# Patient Record
Sex: Male | Born: 1945 | Race: White | Hispanic: No | Marital: Single | Smoking: Former smoker
Health system: Southern US, Community
[De-identification: ages and names within clinical notes are randomized; demographics above are authoritative.]

## PROBLEM LIST (undated history)

## (undated) DIAGNOSIS — F32A Depression, unspecified: Secondary | ICD-10-CM

## (undated) DIAGNOSIS — I480 Paroxysmal atrial fibrillation: Secondary | ICD-10-CM

## (undated) DIAGNOSIS — T4145XA Adverse effect of unspecified anesthetic, initial encounter: Secondary | ICD-10-CM

## (undated) DIAGNOSIS — Z9889 Other specified postprocedural states: Secondary | ICD-10-CM

## (undated) DIAGNOSIS — I712 Thoracic aortic aneurysm, without rupture, unspecified: Secondary | ICD-10-CM

## (undated) DIAGNOSIS — T8859XA Other complications of anesthesia, initial encounter: Secondary | ICD-10-CM

## (undated) DIAGNOSIS — I10 Essential (primary) hypertension: Secondary | ICD-10-CM

## (undated) DIAGNOSIS — I313 Pericardial effusion (noninflammatory): Secondary | ICD-10-CM

## (undated) DIAGNOSIS — F329 Major depressive disorder, single episode, unspecified: Secondary | ICD-10-CM

## (undated) DIAGNOSIS — E785 Hyperlipidemia, unspecified: Secondary | ICD-10-CM

## (undated) DIAGNOSIS — G4733 Obstructive sleep apnea (adult) (pediatric): Secondary | ICD-10-CM

## (undated) DIAGNOSIS — I429 Cardiomyopathy, unspecified: Secondary | ICD-10-CM

## (undated) DIAGNOSIS — R160 Hepatomegaly, not elsewhere classified: Secondary | ICD-10-CM

## (undated) DIAGNOSIS — R931 Abnormal findings on diagnostic imaging of heart and coronary circulation: Secondary | ICD-10-CM

## (undated) DIAGNOSIS — M79609 Pain in unspecified limb: Secondary | ICD-10-CM

## (undated) DIAGNOSIS — I3139 Other pericardial effusion (noninflammatory): Secondary | ICD-10-CM

## (undated) DIAGNOSIS — I493 Ventricular premature depolarization: Secondary | ICD-10-CM

## (undated) HISTORY — DX: Pericardial effusion (noninflammatory): I31.3

## (undated) HISTORY — DX: Essential (primary) hypertension: I10

## (undated) HISTORY — DX: Cardiomyopathy, unspecified: I42.9

## (undated) HISTORY — DX: Other pericardial effusion (noninflammatory): I31.39

## (undated) HISTORY — PX: FRACTURE SURGERY: SHX138

## (undated) HISTORY — DX: Depression, unspecified: F32.A

## (undated) HISTORY — DX: Obstructive sleep apnea (adult) (pediatric): G47.33

## (undated) HISTORY — DX: Ventricular premature depolarization: I49.3

## (undated) HISTORY — DX: Abnormal findings on diagnostic imaging of heart and coronary circulation: R93.1

## (undated) HISTORY — DX: Major depressive disorder, single episode, unspecified: F32.9

## (undated) HISTORY — DX: Thoracic aortic aneurysm, without rupture, unspecified: I71.20

## (undated) HISTORY — DX: Other specified postprocedural states: Z98.890

## (undated) HISTORY — PX: ELBOW / UPPER ARM FOREIGN BODY REMOVAL: SUR1115

## (undated) HISTORY — DX: Paroxysmal atrial fibrillation: I48.0

## (undated) HISTORY — DX: Thoracic aortic aneurysm, without rupture: I71.2

## (undated) HISTORY — DX: Hyperlipidemia, unspecified: E78.5

## (undated) HISTORY — PX: CARDIAC ELECTROPHYSIOLOGY MAPPING AND ABLATION: SHX1292

## (undated) HISTORY — DX: Pain in unspecified limb: M79.609

## (undated) HISTORY — DX: Hepatomegaly, not elsewhere classified: R16.0

---

## 2005-07-20 ENCOUNTER — Emergency Department (HOSPITAL_COMMUNITY): Admission: EM | Admit: 2005-07-20 | Discharge: 2005-07-21 | Payer: Self-pay | Admitting: Emergency Medicine

## 2005-09-08 HISTORY — PX: CARDIOVASCULAR STRESS TEST: SHX262

## 2005-09-17 ENCOUNTER — Ambulatory Visit (HOSPITAL_COMMUNITY): Admission: RE | Admit: 2005-09-17 | Discharge: 2005-09-17 | Payer: Self-pay | Admitting: Cardiology

## 2007-01-29 ENCOUNTER — Inpatient Hospital Stay (HOSPITAL_COMMUNITY): Admission: EM | Admit: 2007-01-29 | Discharge: 2007-01-30 | Payer: Self-pay | Admitting: Emergency Medicine

## 2007-06-20 DIAGNOSIS — M79609 Pain in unspecified limb: Secondary | ICD-10-CM

## 2007-06-20 HISTORY — DX: Pain in unspecified limb: M79.609

## 2007-08-23 ENCOUNTER — Inpatient Hospital Stay (HOSPITAL_COMMUNITY): Admission: EM | Admit: 2007-08-23 | Discharge: 2007-08-23 | Payer: Self-pay | Admitting: Emergency Medicine

## 2007-08-23 ENCOUNTER — Encounter (INDEPENDENT_AMBULATORY_CARE_PROVIDER_SITE_OTHER): Payer: Self-pay | Admitting: Cardiology

## 2007-09-20 ENCOUNTER — Encounter: Admission: RE | Admit: 2007-09-20 | Discharge: 2007-09-20 | Payer: Self-pay | Admitting: Cardiology

## 2007-09-21 ENCOUNTER — Ambulatory Visit (HOSPITAL_COMMUNITY): Admission: RE | Admit: 2007-09-21 | Discharge: 2007-09-21 | Payer: Self-pay | Admitting: Cardiology

## 2007-10-04 ENCOUNTER — Ambulatory Visit (HOSPITAL_COMMUNITY): Admission: RE | Admit: 2007-10-04 | Discharge: 2007-10-04 | Payer: Self-pay | Admitting: Cardiology

## 2007-10-18 ENCOUNTER — Inpatient Hospital Stay (HOSPITAL_COMMUNITY): Admission: EM | Admit: 2007-10-18 | Discharge: 2007-10-19 | Payer: Self-pay | Admitting: Emergency Medicine

## 2007-10-18 HISTORY — PX: CARDIOVERSION: SHX1299

## 2007-10-19 ENCOUNTER — Ambulatory Visit: Admission: AD | Admit: 2007-10-19 | Discharge: 2007-10-19 | Payer: Self-pay | Admitting: Cardiology

## 2007-10-22 ENCOUNTER — Emergency Department (HOSPITAL_COMMUNITY): Admission: EM | Admit: 2007-10-22 | Discharge: 2007-10-22 | Payer: Self-pay | Admitting: Cardiology

## 2009-12-18 DIAGNOSIS — I493 Ventricular premature depolarization: Secondary | ICD-10-CM

## 2009-12-18 HISTORY — DX: Ventricular premature depolarization: I49.3

## 2010-07-22 ENCOUNTER — Other Ambulatory Visit: Payer: Self-pay | Admitting: Family Medicine

## 2010-07-22 DIAGNOSIS — M7989 Other specified soft tissue disorders: Secondary | ICD-10-CM

## 2010-07-22 DIAGNOSIS — R2230 Localized swelling, mass and lump, unspecified upper limb: Secondary | ICD-10-CM

## 2010-07-23 ENCOUNTER — Ambulatory Visit
Admission: RE | Admit: 2010-07-23 | Discharge: 2010-07-23 | Disposition: A | Discharge status: Home / Self Care | Payer: BC Managed Care – PPO | Source: Ambulatory Visit | Attending: Family Medicine | Admitting: Family Medicine

## 2010-07-23 DIAGNOSIS — M7989 Other specified soft tissue disorders: Secondary | ICD-10-CM

## 2010-07-23 DIAGNOSIS — R2230 Localized swelling, mass and lump, unspecified upper limb: Secondary | ICD-10-CM

## 2010-07-30 ENCOUNTER — Other Ambulatory Visit: Payer: Self-pay | Admitting: Family Medicine

## 2010-07-30 DIAGNOSIS — R229 Localized swelling, mass and lump, unspecified: Secondary | ICD-10-CM

## 2010-08-06 ENCOUNTER — Ambulatory Visit
Admission: RE | Admit: 2010-08-06 | Discharge: 2010-08-06 | Disposition: A | Discharge status: Home / Self Care | Payer: BC Managed Care – PPO | Source: Ambulatory Visit | Attending: Family Medicine | Admitting: Family Medicine

## 2010-08-06 DIAGNOSIS — R229 Localized swelling, mass and lump, unspecified: Secondary | ICD-10-CM

## 2010-08-06 MED ORDER — GADOBENATE DIMEGLUMINE 529 MG/ML IV SOLN
19.0000 mL | Freq: Once | INTRAVENOUS | Status: AC | PRN
  Administered 2010-08-06: 19 mL via INTRAVENOUS

## 2010-08-19 NOTE — Op Note (Signed)
NAMEJAYVAN, [name] [name]               ACCOUNT NO.:  [id number]   MEDICAL RECORD NO.:  [id number]          PATIENT TYPE:  INP   LOCATION:  2020                         FACILITY:  [facility]   PHYSICIAN:  Cristy Hilts. Jacinto Halim, MD       DATE OF BIRTH:  02/02/1946   DATE OF PROCEDURE:  DATE OF DISCHARGE:                               OPERATIVE REPORT   PROCEDURE PERFORMED:  Direct current cardioversion.   INDICATIONS:  [name] [name] is a 65 year old gentleman with mild  hypertension and hyperlipidemia who has had lone atrial fibrillation and  had undergone atrial fibrillation ablation about 4 weeks ago in [place].  He has been having recurrent episodes of paroxysmal atrial  fibrillation and was placed on Rythmol and did well for weeks; however,  since last night again he woke up this morning with palpitations and was  found to be in atrial fibrillation.  Hence, he is admitted to the  hospital for atrial fibrillation cardioversion.  We also felt that his  Rythmol was ineffective in controlling his atrial fibrillation and the  patient was also having significant side effects including depression  and fluid overload.  Given this, we felt that he needs to be loaded with  amiodarone and brought in for a cardioversion on a semi-urgent basis and  as he symptomatic with generalized weakness and palpitations.   TECHNIQUE:  Using Pentothal for achieving deep sedation with the help of  anesthesia, 100 joules of synchronized direct current cardioversion was  delivered to the patient with successful cardioversion from atrial  fibrillation to normal sinus rhythm.  The patient tolerated the  procedure well.  No immediate complications noted.      Cristy Hilts. Jacinto Halim, MD  Electronically Signed     JRG/MEDQ  D:  10/18/2007  T:  10/19/2007  Job:  [id number]

## 2010-08-19 NOTE — H&P (Signed)
[name] [name], [name] [name] NO.:  [id number]   MEDICAL RECORD NO.:  [id number]          PATIENT TYPE:  INP   LOCATION:  2899                         FACILITY:  [facility]   PHYSICIAN:  Cristy Hilts. Jacinto Halim, MD       DATE OF BIRTH:  15-Oct-1945   DATE OF ADMISSION:  10/18/2007  DATE OF DISCHARGE:  10/19/2007                              HISTORY & PHYSICAL   CHIEF COMPLAINT:  Palpitations, chest tightness, shortness of breath.   HISTORY:  This is a 65 year old Caucasian gentleman patient of Dr. Jacinto Halim  who has a history of  recurrent atrial fibrillation and multiple  ablations.  He underwent EP study, transseptal catheterization,  pulmonary venography and ablation of atrial flutter, atrial tachycardia  and premature atrial contraction on September 14, 2007 in [place].  Soon after that ablation, the patient converted back to atrial  fibrillation and underwent cardioversion on September 21, 1998 2 weeks after  ablation.  He has been maintaining sinus rhythm for almost 2 weeks, but  then again on October 04, 2007, he had another cardioversion with  conversion to normal sinus rhythm.   Today, the patient woke up in the morning feeling well, went to work and  then started  feeling a rapid irregular heart rate, flutters, tightness  in the chest and slight shortness of breath.  He called our office  complaining of his symptoms and was brought to the office for  evaluation.  We did EKG and it showed atrial fibrillation with  ventricular rate of 97.   PAST MEDICAL HISTORY:  1. As mentioned above, multiple cardioversions.  2. History of flutter/AFib ablation in the past.  3. Hypertension.  4. Depression.  5. Hyperlipidemia.  6. The patient is on anticoagulation therapy with Coumadin.   FAMILY HISTORY:  Significant for stroke and coronary artery disease.   SOCIAL HISTORY:  He is single and does not have children.  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  He drinks 1-2 bottles of wine a week.  He quit tobacco 15 years ago.  He  exercises 7 days a week 30-45 minutes.   REVIEW OF SYSTEMS:  Please refer to HPI, otherwise is negative.   MEDICATIONS:  1. Aspirin 81 mg daily.  2. Metoprolol 50 mg q.a.m. and 25 mg q.p.m.  3. Cartia XT 240 mg daily.  4. Coumadin 5 mg daily except 2.5 mg Monday and Friday.  5. Lipitor 40 mg q.h.s.  6. Propecia 1 mg daily.  7. Ativan 1.5 mg q.h.s.  8. Rythmol SR 325 mg b.i.d.  9. Celexa 20 mg daily.   ALLERGIES:  TETRACYCLINE.   PHYSICAL EXAMINATION:  VITAL SIGNS:  Blood pressure 106/76, heart rate  97.  HEENT:  Normocephalic, atraumatic.  Extraocular movements intact.  NECK:  No JVD or carotid bruits.  LUNGS:  Clear to auscultation bilaterally.  HEART:  Irregularly irregular without any significant murmurs, gallops,  rubs.  ABDOMEN:  Soft, nontender, nondistended with positive bowel sounds x4.  LOWER EXTREMITIES:  Without any signs of edema.  IMPRESSION:  1. Recurrent atrial fibrillation with slightly accelerated ventricular      response - the patient symptomatic with dizziness, chest tightness,      mild shortness of breath and tachy palpitations.  2. Hypertension - controlled.  3. Hyperlipidemia - treated.  4. Family history of coronary artery disease.  5. Anticoagulation therapy.   PLAN:  The patient is going to be admitted to the hospital for  cardioversion and Dr. Jacinto Halim will see the patient in the hospital today.      [name] [name], P.A.      Cristy Hilts. Jacinto Halim, MD  Electronically Signed    MK/MEDQ  D:  10/18/2007  T:  10/18/2007  Job:  [id number]   cc:   Marcy Panning, MD

## 2010-08-19 NOTE — Op Note (Signed)
[name], [name]               ACCOUNT NO.:  [id number]   MEDICAL RECORD NO.:  [id number]          PATIENT TYPE:  OIB   LOCATION:  2899                         FACILITY:  [facility]   PHYSICIAN:  Cristy Hilts. Jacinto Halim, MD       DATE OF BIRTH:  08/31/1945   DATE OF PROCEDURE:  10/04/2007  DATE OF DISCHARGE:                               OPERATIVE REPORT   PROCEDURE PERFORMED:  Direct current cardioversion.   INDICATIONS:  [name] is a 65 year old gentleman with 3 weeks  status post atrial fibrillation ablation in [place] who I had  evaluated on September 20, 2007, for recurrent atrial fibrillation and had  underwent successful cardioversion.  He woke up yesterday again with  palpitations, and he was found to be in atrial fibrillation with rapid  ventricular response.  He is now brought back for elective cardioversion  to maintain rhythm.  He has been started on Rythmol.   Using 375 mg of Pentothal for achieving deep sedation, 100 joules of  synchronized direct current cardioversion was delivered to the patient  with successful cardioversion from atrial fibrillation to normal sinus  rhythm.  The patient tolerated the procedure well.  No immediate  complications are noted.      Cristy Hilts. Jacinto Halim, MD  Electronically Signed     JRG/MEDQ  D:  10/04/2007  T:  10/05/2007  Job:  [id number]

## 2010-08-19 NOTE — Cardiovascular Report (Signed)
NAMEJAMIN, [name], MD       DATE OF BIRTH:  July 23, 1945   DATE OF PROCEDURE:  09/21/2007  DATE OF DISCHARGE:  09/21/2007                            CARDIAC CATHETERIZATION   PROCEDURE PERFORMED:  Direct current cardioversion.   INDICATION:  [name] is a pleasant 65 year old gentleman with  lone atrial fibrillation.  He recently underwent atrial fibrillation  ablation on September 06, 2007, [name]  He has seen yesterday  and had gone back into atrial fibrillation with rapid ventricular  response at the rate of 130 beats per minute.  Given the fact that it  was new onset of atrial fibrillation and an EKG was again done this  morning and confirmed that he was still in persistent atrial  fibrillation.  Hence, he was brought to the hospital outpatient center  for direct current cardioversion.   TECHNIQUE:  Using 250 mg intravenous Pentothal for achieving deep  sedation at 75 joules and then 120 joules x2 synchronized direct current  biphasic electricity was delivered to the patient with successful  cardioversion from atrial fibrillation to normal sinus rhythm.  The  patient tolerated the procedure well.  There were no immediate  complications noted.      Cristy Hilts. Jacinto Halim, MD  Electronically Signed     JRG/MEDQ  D:  09/21/2007  T:  09/22/2007  Job:  [id number]   cc:   Chales Salmon. Abigail Miyamoto, M.D.  Virl Diamond, M.D.

## 2010-08-22 ENCOUNTER — Emergency Department (HOSPITAL_COMMUNITY): Payer: BC Managed Care – PPO

## 2010-08-22 ENCOUNTER — Inpatient Hospital Stay (HOSPITAL_COMMUNITY)
Admission: EM | Admit: 2010-08-22 | Discharge: 2010-08-27 | DRG: 125 | Disposition: A | Discharge status: Home / Self Care | Payer: BC Managed Care – PPO | Attending: Emergency Medicine | Admitting: Emergency Medicine

## 2010-08-22 DIAGNOSIS — E876 Hypokalemia: Secondary | ICD-10-CM | POA: Diagnosis present

## 2010-08-22 DIAGNOSIS — I2510 Atherosclerotic heart disease of native coronary artery without angina pectoris: Secondary | ICD-10-CM | POA: Diagnosis present

## 2010-08-22 DIAGNOSIS — E785 Hyperlipidemia, unspecified: Secondary | ICD-10-CM | POA: Diagnosis present

## 2010-08-22 DIAGNOSIS — I4891 Unspecified atrial fibrillation: Principal | ICD-10-CM | POA: Diagnosis present

## 2010-08-22 DIAGNOSIS — Z7982 Long term (current) use of aspirin: Secondary | ICD-10-CM

## 2010-08-22 DIAGNOSIS — I10 Essential (primary) hypertension: Secondary | ICD-10-CM | POA: Diagnosis present

## 2010-08-22 LAB — CBC
MCH: 29.8 pg (ref 26.0–34.0)
MCV: 87.8 fL (ref 78.0–100.0)
Platelets: 234 10*3/uL (ref 150–400)
RBC: 4.84 MIL/uL (ref 4.22–5.81)

## 2010-08-22 LAB — BASIC METABOLIC PANEL
BUN: 23 mg/dL (ref 6–23)
CO2: 17 mEq/L — ABNORMAL LOW (ref 19–32)
Creatinine, Ser: 0.81 mg/dL (ref 0.4–1.5)
GFR calc Af Amer: >60 mL/min (ref >60)
GFR calc non Af Amer: >60 mL/min (ref >60)
Sodium: 137 mEq/L (ref 135–145)

## 2010-08-22 LAB — CK TOTAL AND CKMB (NOT AT ARMC)
CK, MB: 9.5 ng/mL (ref 0.3–4.0)
Total CK: 332 U/L — ABNORMAL HIGH (ref 7–232)

## 2010-08-22 LAB — POCT I-STAT, CHEM 8
Chloride: 108 mEq/L (ref 96–112)
Creatinine, Ser: 1.1 mg/dL (ref 0.4–1.5)
Glucose, Bld: 75 mg/dL (ref 70–99)
Sodium: 139 mEq/L (ref 135–145)

## 2010-08-22 LAB — APTT: aPTT: 29 seconds (ref 24–37)

## 2010-08-22 LAB — PROTIME-INR
INR: 0.97 (ref 0.00–1.49)
Prothrombin Time: 13.1 seconds (ref 11.6–15.2)

## 2010-08-22 LAB — POCT CARDIAC MARKERS: Myoglobin, poc: 171 ng/mL (ref 12–200)

## 2010-08-22 LAB — DIFFERENTIAL
Basophils Absolute: 0 10*3/uL (ref 0.0–0.1)
Eosinophils Absolute: 0 10*3/uL (ref 0.0–0.7)
Lymphocytes Relative: 26 % (ref 12–46)
Lymphs Abs: 2.3 10*3/uL (ref 0.7–4.0)
Neutrophils Relative %: 63 % (ref 43–77)

## 2010-08-22 NOTE — Discharge Summary (Signed)
[name] [name], [name] [name]               ACCOUNT NO.:  [id number]   MEDICAL RECORD NO.:  [id number]          PATIENT TYPE:  INP   LOCATION:  4733                         FACILITY:  [facility]   PHYSICIAN:  Cristy Hilts. Jacinto Halim, MD       DATE OF BIRTH:  Dec 09, 1945   DATE OF ADMISSION:  08/23/2007  DATE OF DISCHARGE:  08/23/2007                               DISCHARGE SUMMARY   DISCHARGE DIAGNOSES:  1. Atrial fibrillation with rapid ventricular response.  2. Status post TEE-guided DC cardioversion.  3. Cardiomyopathy now with normal ejection fraction, negative Myoview      on June, 7, 2009.  4. History of atrial fibrillation by Dr. Myrtie Soman at [facility] in 2002.  5. Hypertension.  6. Hyperlipidemia.  7. 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   BRIEF HISTORY:  [name] [name] is a 65 year old white male with a history  of paroxysmal atrial fibrillation requiring DC cardioversion in October  2008.  He underwent previous AFib ablation at [facility] by Dr. Myrtie Soman in 2002  and is actually scheduled for a repeat ablation by Dr. Myrtie Soman in mid  June at [facility] in [place] for 2 days.  He presented  to the emergency department at [facility] with 2-day history of fatigue  and tiredness as well as palpitations.  On arrival to the emergency  department, he was noted to be in AFib and 150 beats per minute.   HOSPITAL COURSE:  [name] [name] was seen by Dr. Lynnea Ferrier on the morning of  Aug 23, 2007 and remained in atrial fibrillation despite IV Cardizem.  His INR was 1.8 and Dr. Lynnea Ferrier recommended at that time to undergo TEE-  guided DC cardioversion.  The TEE revealed no thrombus.  There was mild  MR, trace AI, trace PI, and mild TR.  Propofol was administered and the  patient underwent a synchronized cardioversion at 100 joules, which  converted him back to normal sinus rhythm.  His Cardizem drip was  discontinued, and he was continued on his heparin and restarted  and  given his Coumadin.  Once his anesthesia had worn off, he was ambulatory  with that and has no difficulty swallowing and was deemed ready for  discharge home.  He remained in normal sinus rhythm at that time.   DISCHARGE MEDICATIONS:  1. Lipitor 40 mg.  2. Metoprolol 50 mg in the morning and 25 in the evening.  3. Cartia XT 240 mg daily.  4. Hyzaar 100/25 mg 1-1/2 tablet daily.  5. Lorazepam 0.5 mg t.i.d. p.r.n. per patient 1 mg daily.  6. Coumadin 10 mg today and 10 mg tomorrow evening.   He is to have his PT/INR drawn on 08/24/2007.  He is to hold his  aspirin.  He is to follow up with our office and can keep his  appointment with Dr. Myrtie Soman.  Our office will contact him with the day  and time for his appointment with doctor.  He is to call our office or  return with any recurrent palpitations or tachycardia or  with any new  problems or questions.   LABORATORY DATA:  White blood cell count is 8.5, hemoglobin 13.4, and  hematocrit 39.8.  PT 23.9 and INR 2.1.  Sodium 137, potassium 4.5,  glucose 114, BUN 19, and creatinine 1.13.  Initial CK 260, MB 4.9 with  the troponin of less than 0.01.  A set of cardiac enzymes revealed a CK  of 206, MB of 4.3, and again troponin of 0.01.  Total cholesterol was  188, triglycerides 63, HDL 47, LDL 128, and TSH is 1.6.     ______________________________  Charmian Muff, NP       ______________________________  Cristy Hilts. Jacinto Halim, MD    LS/MEDQ  D:  11/02/2007  T:  11/02/2007  Job:  [id number]

## 2010-08-22 NOTE — Discharge Summary (Signed)
[name], [name] [name]               ACCOUNT NO.:  [id number]   MEDICAL RECORD NO.:  [id number]          PATIENT TYPE:  INP   LOCATION:  2924                         FACILITY:  [facility]   PHYSICIAN:  Cristy Hilts. Jacinto Halim, MD       DATE OF BIRTH:  06/24/1945   DATE OF ADMISSION:  01/29/2007  DATE OF DISCHARGE:  01/30/2007                               DISCHARGE SUMMARY   DISCHARGE DIAGNOSES:  1. Paroxysmal atrial fibrillation, DC cardioversion to sinus rhythm      this admission.  2. History of Coumadin intolerance.  3. History of paroxysmal atrial fibrillation with prior DC      cardioversions in the past.  4. History of atrial fibrillation ablation at [facility] in the past.  5. 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  Last echocardiogram      June 2007 showed normalization of left ventricular function.   HOSPITAL COURSE:  The patient is a 65 year old male with history of PAF  requiring cardioversion in the past.  Apparently had an AF ablation at  [facility] six years ago or so.  He has had a remote viral cardiomyopathy, but  a recent echo in June 2007 showed preserved LV function.  He has  apparently been intolerant to Coumadin which gives him severe itching.  The patient was last cardioverted April 2007.  He had been doing well  until the day of admission January 29, 2007.  He presented in atrial  fibrillation with rapid ventricular response.  The patient was admitted  to telemetry and started on IV diltiazem and Lovenox.  Enzymes were  negative.  He underwent elective DC cardioversion the late morning of  26th.  He was discharged later that day.   DISCHARGE MEDICATIONS:  1. Metoprolol XL 75 mg a day.  2. Lipitor 40 mg a day.  3. Aspirin 325 mg a day.  4. Lorazepam 0.5 t.i.d.  5. Propecia once a day.  6. Diltiazem CD 120 daily.  7. His lisinopril has been cut back to 20 mg a day.   LABORATORY DATA:  White count 6.9, hemoglobin 13.1, hematocrit 39.8,  platelets 237.  INR 0.9.  Sodium 141,  potassium 4.3, BUN 17, creatinine  1.  CK and troponin are negative x3.  Total cholesterol 160 with LDL 92,  HDL 47.  TSH 1.64.   DISPOSITION:  The patient is discharged in stable condition and will  follow up with Dr. Jacinto Halim.      Abelino Derrick, P.A.      Cristy Hilts. Jacinto Halim, MD  Electronically Signed    LKK/MEDQ  D:  03/18/2007  T:  03/19/2007  Job:  [id number]   cc:   Cristy Hilts. Jacinto Halim, MD

## 2010-08-23 DIAGNOSIS — I4891 Unspecified atrial fibrillation: Secondary | ICD-10-CM

## 2010-08-23 LAB — CBC
HCT: 41.8 % (ref 39.0–52.0)
MCV: 87.8 fL (ref 78.0–100.0)
Platelets: 243 10*3/uL (ref 150–400)

## 2010-08-23 LAB — BASIC METABOLIC PANEL
BUN: 19 mg/dL (ref 6–23)
CO2: 19 mEq/L (ref 19–32)
Chloride: 102 mEq/L (ref 96–112)
GFR calc Af Amer: >60 mL/min (ref >60)
Glucose, Bld: 81 mg/dL (ref 70–99)
Potassium: 3.9 mEq/L (ref 3.5–5.1)

## 2010-08-23 LAB — CARDIAC PANEL(CRET KIN+CKTOT+MB+TROPI)
CK, MB: 10.5 ng/mL (ref 0.3–4.0)
Relative Index: 3.2 — ABNORMAL HIGH (ref 0.0–2.5)
Total CK: 332 U/L — ABNORMAL HIGH (ref 7–232)
Troponin I: <0.3 ng/mL (ref <0.30)

## 2010-08-23 LAB — HEPARIN LEVEL (UNFRACTIONATED): Heparin Unfractionated: 0.72 IU/mL — ABNORMAL HIGH (ref 0.30–0.70)

## 2010-08-24 LAB — CBC
Hemoglobin: 12.3 g/dL — ABNORMAL LOW (ref 13.0–17.0)
Platelets: 214 10*3/uL (ref 150–400)
RBC: 4.24 MIL/uL (ref 4.22–5.81)
WBC: 6.9 10*3/uL (ref 4.0–10.5)

## 2010-08-24 LAB — HEPARIN LEVEL (UNFRACTIONATED): Heparin Unfractionated: 0.34 IU/mL (ref 0.30–0.70)

## 2010-08-25 HISTORY — PX: CARDIAC CATHETERIZATION: SHX172

## 2010-08-25 LAB — CBC
MCHC: 33.2 g/dL (ref 30.0–36.0)
Platelets: 217 10*3/uL (ref 150–400)
RDW: 13.5 % (ref 11.5–15.5)
WBC: 6.9 10*3/uL (ref 4.0–10.5)

## 2010-08-25 LAB — BASIC METABOLIC PANEL
BUN: 12 mg/dL (ref 6–23)
Calcium: 9.2 mg/dL (ref 8.4–10.5)
GFR calc non Af Amer: >60 mL/min (ref >60)
Glucose, Bld: 114 mg/dL — ABNORMAL HIGH (ref 70–99)
Potassium: 3.5 mEq/L (ref 3.5–5.1)
Sodium: 139 mEq/L (ref 135–145)

## 2010-08-25 LAB — PROTIME-INR
INR: 0.95 (ref 0.00–1.49)
Prothrombin Time: 12.9 seconds (ref 11.6–15.2)

## 2010-08-25 LAB — HEPARIN LEVEL (UNFRACTIONATED): Heparin Unfractionated: 0.46 IU/mL (ref 0.30–0.70)

## 2010-08-26 HISTORY — PX: TRANSESOPHAGEAL ECHOCARDIOGRAM: SHX273

## 2010-08-26 LAB — CBC
HCT: 38.5 % — ABNORMAL LOW (ref 39.0–52.0)
Hemoglobin: 12.6 g/dL — ABNORMAL LOW (ref 13.0–17.0)
MCHC: 32.7 g/dL (ref 30.0–36.0)
RDW: 13.6 % (ref 11.5–15.5)
WBC: 7.8 10*3/uL (ref 4.0–10.5)

## 2010-08-26 LAB — BASIC METABOLIC PANEL
CO2: 24 mEq/L (ref 19–32)
Calcium: 9.1 mg/dL (ref 8.4–10.5)
Creatinine, Ser: 0.79 mg/dL (ref 0.4–1.5)
GFR calc Af Amer: >60 mL/min (ref >60)
Sodium: 140 mEq/L (ref 135–145)

## 2010-08-26 LAB — HEPARIN LEVEL (UNFRACTIONATED): Heparin Unfractionated: <0.1 IU/mL — ABNORMAL LOW (ref 0.30–0.70)

## 2010-08-27 LAB — CBC
Hemoglobin: 12.6 g/dL — ABNORMAL LOW (ref 13.0–17.0)
Platelets: 221 10*3/uL (ref 150–400)
RBC: 4.35 MIL/uL (ref 4.22–5.81)
WBC: 7.7 10*3/uL (ref 4.0–10.5)

## 2010-08-27 NOTE — Cardiovascular Report (Signed)
  [name] [name], [name] [name]               ACCOUNT NO.:  [id number]  MEDICAL RECORD NO.:  [id number]           PATIENT TYPE:  I  LOCATION:  2041                         FACILITY:  [facility]  PHYSICIAN:  Italy Hilty, MD         DATE OF BIRTH:  [birth year]  DATE OF PROCEDURE:  08/25/2010 DATE OF DISCHARGE:                           CARDIAC CATHETERIZATION   This is a left heart catheterization.  OPERATOR:  Italy Hilty, MD  INDICATIONS:  Atrial fibrillation and history of cardiomyopathy, rule out obstructive coronary artery disease.  PROCEDURE IN DETAILS:  The patient was brought into the cardiac catheterization, sterilely draped and prepped in the usual fashion. After procedural and radiation safety time-out, the area around the right radial artery was identified and was anesthetized locally with approximately 5 mL of 1% lidocaine.  Once local anesthesia was achieved and the patient was moderately sedated with 2 mg of Versed and 50 mcg of fentanyl, the right radial artery was accessed with a straight needle and wire using the Seldinger technique.  A 5-French right radial sheath was placed and subsequent catheterization was performed with 5-French Tig 4.0 catheter, JR-4 catheter, and pigtail catheter.  Estimated blood loss less than 10 mL.  No acute complications.  The patient also received 5000 units of international units of IV heparin as well as 10 mL of radial cocktail to prevent spasm.  FINDINGS: 1. Left main - no disease. 2. LAD - mild luminal irregularities. 3. Left circumflex - mild luminal irregularities. 4. RCA - dominant.  No disease. 5. LVEDP - 12 mmHg.  IMPRESSION: 1. No significant obstructive coronary artery disease. 2. LVEDP = 12 mmHg.  PLAN: 1. Proceed with antiarrhythmic therapy for atrial fibrillation. 2. Start Pradaxa first dose this p.m. 3. Likely TEE cardioversion tomorrow.     Italy Hilty, MD     CH/MEDQ  D:  08/25/2010  T:  08/25/2010  Job:   [id number]  cc:   Nicki Guadalajara, M.D. Hillis Range, MD  Electronically Signed by Kirtland Bouchard. HILTY M.D. on 08/27/2010 08:14:33 AM

## 2010-08-29 NOTE — Op Note (Signed)
  NAMEKAIDENCE, CALLAWAY NO.:  [id number]  MEDICAL RECORD NO.:  [id number]           PATIENT TYPE:  I  LOCATION:  2041                         FACILITY:  [facility]  PHYSICIAN:  Nicki Guadalajara, M.D.     DATE OF BIRTH:  [birth year]  DATE OF PROCEDURE:  08/27/2010 DATE OF DISCHARGE:  08/27/2010                              OPERATIVE REPORT   INDICATION:  [name] is a very pleasant 65 year old, who is now referred for DC cardioversion following development of recurrent atrial fibrillation.  The patient has a history of atrial fibrillation over the past 20 years.  He had been treated with multiple antiarrhythmic medications and has undergone two atrial fibrillation ablation procedures by Dr. Larence Penning at [facility] in 2001 and then again at [facility], 2009.  The patient had remained stable for the last several years, but developed recurrent atrial fibrillation with rapid ventricular response leading to hospitalization, [facility] over the weakened.  At that time, he was started on IV heparin therapy.  CPK enzymes were mildly positive.  He consequently underwent definitive diagnostic cardiac catheterization on Aug 25, 2010, which showed mild luminal irregularities, but that was without significant coronary obstructive disease.  Ejection fraction was approximately 55% by echocardiography.  The patient was started on Pradaxa following his cardiac catheterization and also was started on flecainide.  Yesterday, he underwent transesophageal echo which did not demonstrate any LA clot. He now presents today for DC cardioversion after receiving four doses of Pradaxa and having been on heparin anticoagulation for over 2 days prior to Pradaxa initiation.  Anesthesia was provided by Dr. Noreene Larsson and the patient received 80 mg of intravenous Diprivan.  Synchronized cardioversion was done via anterior and posterior pads and the patient successfully  cardioverted to sinus rhythm following one shock at 120 joules.  He tolerated the procedure well.  A 12-lead ECG subsequently shows normal sinus rhythm with one PVC.  PR interval 200 milliseconds.  QTC interval 438 milliseconds.  The patient tolerated the procedure well.  IMPRESSION:  Successful DC cardioversion from atrial fibrillation with restoration to normal sinus rhythm.          ______________________________ Nicki Guadalajara, M.D.     TK/MEDQ  D:  08/27/2010  T:  08/28/2010  Job:  [id number]  cc:   Hillis Range, MD  Electronically Signed by Nicki Guadalajara M.D. on 08/29/2010 02:57:32 PM

## 2010-08-29 NOTE — Consult Note (Signed)
NAMEVALERY, [name] [name] NO.:  [id number]  MEDICAL RECORD NO.:  [id number]           PATIENT TYPE:  I  LOCATION:  2041                           FACILITY:  PHYSICIAN:  Hillis Range, MD       DATE OF BIRTH:  Aug 10, 1945  DATE OF CONSULTATION: DATE OF DISCHARGE:                                CONSULTATION   REQUESTING PHYSICIAN:  Nicki Guadalajara, MD  REASON FOR CONSULTATION:  Atrial fibrillation.  HISTORY OF PRESENT ILLNESS:  [name] [name] is a pleasant 65 year old gentleman with a history of persistent atrial fibrillation who was admitted with symptomatic atrial fibrillation.  He reports having atrial fibrillation initially diagnosed approximately 20 years ago after presenting with acute decompensated heart failure as well as atrial fibrillation with rapid ventricular rates.  He was treated with multiple antiarrhythmic medications including Rythmol, Tikosyn, and amiodarone. He subsequently was evaluated by Dr. Lazaro Arms at [facility] in 2001 and underwent atrial fibrillation ablation at that time.  He reports doing well for approximately a year and half before developing recurrent symptomatic atrial fibrillation.  He had increasing frequency and duration of atrial fibrillation thereafter and required amiodarone therapy with multiple cardioversions repeated.  He then followed up with Dr. Lazaro Arms at [facility] and underwent a repeat AFib ablation in 2009.  He reports doing very well since that time.  He has had no further atrial fibrillation and has been off all antiarrhythmic drugs.  He reports recently having symptoms of orthostatic dizziness and PVCs.  He had an associated fatigue and was evaluated by Dr. Ritta Slot who decreased the patient's Toprol-XL from 100 mg daily to 50 mg twice daily.  The patient also continues to drink frequent alcohol.  He states that over the past few days he has had progressive fatigue and decreased  exercise tolerance.  He also reports lightheadedness and dizziness.  He checked his pulse and found that it was very fast and irregular.  He therefore presented for further evaluation and was found to have atrial fibrillation with ventricular rates in the 160s.  He was therefore admitted for further management. Presently, he is resting comfortably and is without complaint.  He denies chest pain, further syncope or other concerns.  PAST MEDICAL HISTORY: 1. Persistent atrial fibrillation (as above). 2. 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 3. Hypertension. 4. Hyperlipidemia. 5. Thoracic aortic aneurysm of 4.1 cm by CT in 2007. 6. Depression.  MEDICATIONS: 1. Aspirin 325 mg daily. 2. Toprol-XL 50 mg b.i.d. 3. Atorvastatin 40 mg at bedtime. 4. Propecia.  ALLERGIES:  TETRACYCLINE causes hives.  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  He is single and has no children.  He denies tobacco or drug use.  He drinks 2 bottles of wine per week.  FAMILY HISTORY:  Notable for father with coronary artery disease and mother who died of stroke.  His brother has atrial fibrillation.  REVIEW  OF SYSTEMS:  All systems were reviewed and negative except as outlined in the HPI above.  PHYSICAL EXAMINATION:  Telemetry reveals atrial fibrillation with ventricular rates of 100 beats per minute. VITAL SIGNS:  Blood pressure 97/59, heart rate 72, respirations 20, sats 94-100% on room air, afebrile, weight 92 kg. GENERAL:  The patient is a well-appearing male in no acute distress.  He is alert and oriented x3. HEENT:  Normocephalic, atraumatic.  Sclerae clear.  Conjunctivae pink. Oropharynx clear. NECK:  Supple.  No thyromegaly, JVD or bruits. LUNGS:  Clear to auscultation bilaterally. HEART:  Irregularly irregular  rhythm.  No murmurs, rubs or gallops. GI:  Soft, nontender, nondistended.  Positive bowel sounds. EXTREMITIES:  No clubbing, cyanosis or edema. SKIN:  No ecchymoses or lacerations. MUSCULOSKELETAL:  No deformity or atrophy. PSYCH:  Euthymic mood.  Full affect.  EKG from today confirms atrial fibrillation with a ventricular rate of 101 beats per minute.  There is poor R-wave progression as well as a single PVC observed.  The QTc is 390 milliseconds.  LABORATORY DATA:  Cardiac markers reveal a CK of 332, an MB of 10 and troponin of less than 0.3, potassium 3.9, creatinine 0.79, hematocrit 41, platelets 243.  TSH 0.81.  Chest x-ray reveals no acute airspace disease.  Echocardiogram from Aug 23, 2007, is reviewed and reveals a normal ejection fraction with no significant wall motion abnormalities.  There is no significant valvular disease.  At that time, the left atrium was moderately dilated.  IMPRESSION:  [name] [name] is a very pleasant 65 year old gentleman with a longstanding history of atrial fibrillation who now presents with symptomatic recurrent atrial fibrillation.  He is status post atrial fibrillation ablation by Dr. Lazaro Arms on 2 separate occasions and has had a very good result from ablation and has actually been off antiarrhythmic drugs over the past 3 years.  This represents a single recurrence of atrial fibrillation following ablation.  RECOMMENDATIONS:  I had a long discussion with the patient today regarding medicine and catheter-based therapies for atrial fibrillation. At this point, it is hard to know which direction to take as this is a single recurrence in 3 years following ablation.  I think that our initial strategy should be to pursue anticoagulation and cardioversion. I had discussed risks, benefits and alternatives to Coumadin, Pradaxa and Xarelto at length with the patient today.  Presently, he would prefer Pradaxa and I think that this would be a  reasonable strategy.  We will therefore plan to start the patient on Pradaxa 150 mg twice daily. I have spoken with Dr. Tresa Endo who feels that the patient should have a left heart catheterization prior to initiation of his anticoagulant.  We will therefore plan for the patient to have a left heart catheterization on Monday.  I think that 6 hours following the cath we should initiate Pradaxa (Monday evening).  Once he has had four doses of Pradaxa then I would recommend that we proceed with transesophageal echocardiography guided cardioversion.  His left heart catheterization will help [place] determine which antiarrhythmic drug to initiate.  I think that if he has a normal ejection fraction by echo and no coronary artery disease then flecainide would be the most prudent antiarrhythmic drug option.  If he is found to have a significant reduction in ejection fraction or significant coronary artery disease, then we may consider Tikosyn as our initial antiarrhythmic drug.  I also think that an echocardiogram would be helpful to evaluate the left atrial size and  further determine our ability to achieve and maintain sinus rhythm for this patient.  I will be happy to follow the patient with you during his hospital stay.     Hillis Range, MDJA/MEDQ  D:  08/23/2010  T:  08/24/2010  Job:  [id number]  cc:   Nicki Guadalajara, M.D.  Electronically Signed by Hillis Range MD on 08/29/2010 05:55:54 PM

## 2010-09-12 ENCOUNTER — Ambulatory Visit (HOSPITAL_BASED_OUTPATIENT_CLINIC_OR_DEPARTMENT_OTHER): Admission: RE | Admit: 2010-09-12 | Payer: BC Managed Care – PPO | Source: Ambulatory Visit | Admitting: Surgery

## 2010-09-18 NOTE — H&P (Signed)
[name], [name]:  [id number]  MEDICAL RECORD NO.:  [id number]           PATIENT TYPE:  E  LOCATION:  [facility]                         FACILITY:  [facility]  PHYSICIAN:  Nicki Guadalajara, M.D.     DATE OF BIRTH:  1945/10/08  DATE OF ADMISSION:  08/22/2010 DATE OF DISCHARGE:                             HISTORY & PHYSICAL   CHIEF COMPLAINT:  Atrial fibrillation.  HISTORY OF PRESENT ILLNESS:  [name] is a very pleasant 65 year old white male with a history of recurrent atrial fibrillation.  He has undergone several AFib ablation procedures, the most recent being 3 years ago at the [facility] [place] with Dr. Ronn Melena.  He has done well since that time with one cardioversion shortly after his ablation, however, in the last 2-3 weeks, he has experienced some increased fatigue, less energy, and more tiredness.  He was seen by Dr. Lynnea Ferrier within the last week and half, and EKG reveals sinus rhythm, however, he had multiple PVCs.  Because of his fatigue and tiredness, Dr. Lynnea Ferrier split his dose of metoprolol.  He was instructed to take 50 mg in the morning and 50 mg in the evening.  He did not note any significant change with the medication adjustment and has continued to feel poorly.  Last evening, he experienced more significant fatigue and this morning stood and felt lightheaded and dizzy.  He proceeded to go upstairs to his bedroom and was extremely fatigued by the time he got to the top of the steps.  He also noted some diaphoresis, was cool and clammy.  He rested for a short period of time and then proceeded on for evaluation.  He denies any chest pain.  No pressure or heaviness.  He has not experienced any lightheadedness or dizziness this evening.  He has been started on IV heparin, as well as IV Cardizem, and his rate now ranges from 120 to 150.  He does report some improvement, however, still complains of fatigue and tiredness.  He  denies any orthopnea.  No PND or lower extremity edema.  He denies fever or chills.  No cough or shortness of breath.  No wheezing.  On arrival, his EKG revealed atrial fibrillation at a rate of 161 beats per minute.  His BMET was normal with the exception of his CO2 which was 17.  His point-of-care markers were within normal limits as was his CBC.  PAST MEDICAL HISTORY: 1. Atrial fibrillation with rapid ventricular response.     a.     Status post AFib ablation by Dr. Ronn Melena at [facility] in 2001.     b.     Status post AFib ablation by Dr. Ronn Melena at [facility] in 2009. 2. History of cardiomyopathy. 3. Multiple DC cardioversions. 4. Hypertension. 5. Dyslipidemia. 6. Depression. 7. History of borderline thoracic ascending aortic aneurysm at 4.1 cm.     This was by CT of the chest done in June 2007.  FAMILY HISTORY:  Positive for coronary artery disease  in his father.  SOCIAL HISTORY:  He is single.  He has no children.  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  He drinks wine with dinner on a daily basis.  He denies any tobacco use, he quit over 15 years ago.  He denies any illicit drug use, and he remains active.  REVIEW OF SYSTEMS:  As per HPI, otherwise negative.  ALLERGIES:  TETRACYCLINE causes hives.  CURRENT MEDICATIONS:  Atorvastatin 40 mg nightly, metoprolol 50 mg b.i.d., Propecia, ASA 325 mg daily.  PHYSICAL EXAMINATION:  VITAL SIGNS:  Blood pressure is 119/87, pulse 121, respirations 18, pulse ox is 99%, and temperature is 98.3. GENERAL:  This is a very pleasant 65 year old white male, in no acute distress. HEENT:  Pupils are equal, reactive to light and accommodation. Extraocular movements intact. NECK:  Supple.  No JVD, carotid bruits, or thyromegaly. CARDIOVASCULAR:  Irregularly irregular S1 and S2 without murmur, gallop, or rub. LUNGS:  Clear to  auscultation bilaterally with normal respiratory effort. ABDOMEN:  Soft, nontender.  No hepatosplenomegaly or masses. EXTREMITIES:  Radial, femoral, dorsal pedal arteries are present without lower extremity edema.  No clubbing, cyanosis, or ulcers. NEUROLOGIC:  Alert and oriented to person, place, and time.  Normal mood and affect. SKIN:  Pink, warm, and dry.  LABORATORY DATA:  Chest x-ray has not been performed. BMET reveals a sodium of 137, potassium 3.9, BUN is 23, creatinine is 0.81 with a chloride of 17, PTT is 29, PT is 13.1 with an INR of 0.97. Myoglobin is 171, CK-MB 4.8, troponin is less than 0.05.  CBC revealed a white blood cell count of 9.0, hemoglobin of 14.4, and a hematocrit of 42.5.  His platelets are 234.  IMPRESSION: 1. Atrial fibrillation with rapid ventricular response. 2. History of cardiomyopathy. 3. Status post multiple ablations by Dr. Ronn Melena, currently at     [facility] [place]. 4. Multiple cardioversions. 5. Hypertension. 6. Dyslipidemia.  PLAN:  We will admit to telemetry and continue with his Cardizem and heparin.  We will also give him a dose of IV metoprolol for better heart rate control.  We will keep him n.p.o. after midnight for possible DC cardioversion in the morning, and we will check an echo.    ______________________________ [employer], NP   ______________________________ Nicki Guadalajara, M.D.   LS/MEDQ  D:  08/22/2010  T:  08/22/2010  Job:  [id number]  cc:   [facility]  Electronically Signed by Charmian Muff NP on 09/01/2010 10:07:57 PM Electronically Signed by Nicki Guadalajara M.D. on 09/18/2010 04:11:36 PM

## 2010-09-18 NOTE — Discharge Summary (Signed)
NAMEARTAVIS, [name] NO.:  [id number]  MEDICAL RECORD NO.:  [id number]           PATIENT TYPE:  I  LOCATION:  2041                         FACILITY:  [facility]  PHYSICIAN:  Nicki Guadalajara, M.D.     DATE OF BIRTH:  [birth year]  DATE OF ADMISSION:  08/22/2010 DATE OF DISCHARGE:  08/27/2010                              DISCHARGE SUMMARY   DISCHARGE DIAGNOSES: 1. Paroxysmal atrial fibrillation, controlled ventricular rate,     currently of Pradaxa, TEE completed on Aug 06, 2010, and direct     current cardioversion at 11:00 a.m. on Aug 27, 2010. 2. Nonobstructive coronary artery disease, heart cath on Aug 25, 2010. 3. Volume overload secondary to atrial fibrillation. 4. Hypokalemia repleted.  HOSPITAL COURSE:  [name] is a 65 year old Caucasian male with history of recurrent atrial fibrillation.  He has undergone several AFib ablation procedures, most recent being 3 years ago at [facility] [place] with Dr. Ronn Melena.  Over the course of his atrial fibrillation, he has undergone approximately 26 cardioversions. Over the last 2-3 weeks, he has experienced some increase in fatigue, less energy, and more tiredness.  Upon assessment in the ER, we saw him to be in atrial fibrillation with rapid ventricular response.  He was admitted to Telemetry, continued on Cardizem and heparin.  He was also was given a dose of IV metoprolol and made n.p.o. for possible DC cardioversion the following day.  EP consult was requested.  The patient was seen by Dr. Johney Frame.  He recommended 2D echo started on flecainide and stop Pradaxa, and he also recommended TEE guided cardioversion.  On Aug 24, 2010, the patient stated he felt much better.  His heart rate was controlled in the 70s.  Still in atrial fibrillation.  A 2D echocardiogram was completed and showed ejection fraction approximately 55% without wall motion abnormalities.  Left heart cath was ordered for Aug 25, 2010, and showed no significant obstructive coronary artery disease and LVEDP of 12 mmHg.  He was started on Pradaxa 150 mg b.i.d. and also set up for TEE for Aug 26, 2010.  This was completed, showed no LA clot and a minimally dilated left atrium.  It also showed good left atrial appendage emptying  velocities.  He was then setup for DC cardioversion on Aug 27, 2010, at 11 o'clock.  This was completed.  The patient was currently converted back to normal sinus rhythm.  He has been seen by Dr. Tresa Endo who feels he is stable for discharge now.  DISCHARGE LABORATORY DATA:  WBC 7.7, hemoglobin 12.6, hematocrit 38.7, platelets 221.  Sodium 140, potassium is 3.3, chloride 104, carbon dioxide 24, glucose 110, BUN 8, creatinine 0.79, calcium 9.1.  STUDIES/PROCEDURES: 1. Chest x-ray Aug 22, 2010, showed no acute cardiopulmonary disease. 2. Transesophageal echocardiogram Aug 26, 2010, showed the left     ventricle cavity size was normal, wall thickness was normal.     Systolic function was normal with EF of 50-60%.  Wall motion was     normal.  No wall motion abnormalities.  Left atrium showed no  evidence of thrombus in the atrial cavity or the appendage.  The     emptying velocity was normal.  Pericardium and extracardiac is     small, free flow, and pericardial effusion was identified     circumferential to the heart. 3. 2D transthoracic echo Aug 23, 2010, showed PTA pressures of 32     mmHg.  EF 55-60%.  No wall motion abnormalities.  Trivial mitral     valve regurgitation. 4. Cardiac catheterization Aug 25, 2010, showed no disease in the left     main.  There was mild luminal irregularities in the LAD, mild     luminal irregularities in the left circumflex, and RCA was dominant     with no disease.  Left ventricle end-diastolic pressure was 12     mmHg.  Overall, impression was no significant obstructive coronary     artery disease. 5. DC cardioversion Aug 27, 2010, bedside DC  cardioversion was     completed with anesthesia.  The patient was converted from atrial     fibrillation to normal sinus rhythm.  DISCHARGE MEDICATIONS: 1. Pradaxa 150 mg 1 tablet by mouth every 12 hours. 2. Flecainide 100 mg 1 tablet by mouth twice daily. 3. Aspirin 325 mg 1 tablet by mouth daily. 4. Atorvastatin 40 mg 1 tablet by mouth daily at bedtime. 5. Finasteride 5 mg 1 tablet by mouth daily. 6. Metoprolol tartrate 100 mg 1/2 tablet by mouth twice daily. 7. Multivitamin over the counter 1 tablet by mouth daily. 8. Saw Palmetto 500 mg over the counter 1 tablet by mouth twice daily.  DISPOSITION:  [name] will be discharged home in stable condition. Recommended to increase his activity slowly.  Recommended to return to work next week on Monday, April 25, 2010.  He should eat a heart- healthy diet.  FOLLOWUP:  Dr. Tresa Endo, Wednesday, September 17, 2010, at 11:30 a.m. and with Dr. Johney Frame on October 01, 2010, and 12:25 p.m.    ______________________________ Wilburt Finlay, PA   ______________________________ Nicki Guadalajara, M.D.    BH/MEDQ  D:  08/27/2010  T:  08/28/2010  Job:  [id number]  cc:   Hillis Range, MD  Electronically Signed by Wilburt Finlay PA on 09/11/2010 04:34:20 PM Electronically Signed by Nicki Guadalajara M.D. on 09/18/2010 04:11:39 PM

## 2010-09-30 ENCOUNTER — Encounter: Payer: Self-pay | Admitting: Internal Medicine

## 2010-10-01 ENCOUNTER — Ambulatory Visit (INDEPENDENT_AMBULATORY_CARE_PROVIDER_SITE_OTHER): Payer: BC Managed Care – PPO | Admitting: Internal Medicine

## 2010-10-01 ENCOUNTER — Encounter: Payer: Self-pay | Admitting: Internal Medicine

## 2010-10-01 VITALS — BP 139/86 | HR 72 | Resp 14 | Ht 74.0 in | Wt 210.0 lb

## 2010-10-01 DIAGNOSIS — I4891 Unspecified atrial fibrillation: Secondary | ICD-10-CM

## 2010-10-01 DIAGNOSIS — I10 Essential (primary) hypertension: Secondary | ICD-10-CM | POA: Insufficient documentation

## 2010-10-01 DIAGNOSIS — I4819 Other persistent atrial fibrillation: Secondary | ICD-10-CM | POA: Insufficient documentation

## 2010-10-01 NOTE — Progress Notes (Signed)
The patient presents today for routine electrophysiology followup.  Since his recent hospitalization, the patient reports doing very well.   He has had no further afib.  Today, he denies symptoms of palpitations, chest pain, shortness of breath, orthopnea, PND, lower extremity edema, dizziness, presyncope, syncope, or neurologic sequela.  The patient feels that he is tolerating medications without difficulties and is otherwise without complaint today.   Past Medical History  Diagnosis Date  . Paroxysmal atrial fibrillation     s/p afib 2001 and 2009 by Dr Delena Serve  . Coronary artery disease     no significant CAD by cath 5/12  . Cardiomyopathy     hx of tachycardia mediated CM, now resolved  . Hypertension   . Hyperlipidemia   . Thoracic aortic aneurysm   . Depression    Past Surgical History  Procedure Date  . Cardiac catheterization 08/25/10    NO SIGNIFICANT CAD  . Cardioversion     Current Outpatient Prescriptions  Medication Sig Dispense Refill  . atorvastatin (LIPITOR) 40 MG tablet Take 40 mg by mouth daily.        . dabigatran (PRADAXA) 150 MG CAPS Take 150 mg by mouth every 12 (twelve) hours.        . finasteride (PROPECIA) 1 MG tablet Take 1 mg by mouth daily.        . flecainide (TAMBOCOR) 100 MG tablet Take 100 mg by mouth 2 (two) times daily.        . metoprolol (TOPROL-XL) 100 MG 24 hr tablet Take 100 mg by mouth daily.        . Multiple Vitamin (MULTIVITAMIN) tablet Take 1 tablet by mouth daily.        . saw palmetto 160 MG capsule Take 160 mg by mouth 2 (two) times daily.        Marland Kitchen DISCONTD: aspirin 325 MG tablet Take 325 mg by mouth daily.        Marland Kitchen DISCONTD: metoprolol (LOPRESSOR) 100 MG tablet Take 50 mg by mouth 2 (two) times daily.          Allergies  Allergen Reactions  . Tetracyclines & Related     History   Social History  . Marital Status: Single    Spouse Name: N/A    Number of Children: 0  . Years of Education: N/A   Occupational History  .   [employer]   Social History Main Topics  . Smoking status: Former Games developer  . Smokeless tobacco: Not on file  . Alcohol Use: Yes     2 bottles of wine per week  . Drug Use: No  . Sexually Active: Not on file   Other Topics Concern  . 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  He is single and has no children.    Family History  Problem Relation Age of Onset  . Coronary artery disease Father   . Stroke Mother     died  . Atrial fibrillation Brother    Physical Exam: Filed Vitals:   10/01/10 1231  BP: 139/86  Pulse: 72  Resp: 14  Height: 6\' 2"  (1.88 m)  Weight: 210 lb (95.255 kg)    GEN- The patient is well appearing, alert and oriented x 3 today.   Head- normocephalic, atraumatic Eyes-  Sclera  clear, conjunctiva pink Ears- hearing intact Oropharynx- clear Neck- supple, no JVP Lymph- no cervical lymphadenopathy Lungs- Clear to ausculation bilaterally, normal work of breathing Heart- Regular rate and rhythm, no murmurs, rubs or gallops, PMI not laterally displaced GI- soft, NT, ND, + BS Extremities- no clubbing, cyanosis, or edema MS- no significant deformity or atrophy Skin- no rash or lesion Psych- euthymic mood, full affect Neuro- strength and sensation are intact  ekg today reveals sinus rhythm 70 bpm, PR 214, QRS 92, Qtc 414, septal infarct pattern  Assessment and Plan:

## 2010-10-01 NOTE — Assessment & Plan Note (Signed)
Doing very well at this time.  He is tolerating pradaxa, metoprolol and flecainide. I have stopped ASA today. He will continue pradaxa at this time (CHADSVASC score is 1).  We may consider switching back to ASA in the future. If no further afib upon return, we could consider stopping flecainide at that time.  His recent hospitalization represents his first occurrence since afib ablation 2009. Lifestyle changes including weight loss, treatment of OSA, and avoidance of ETOH were discussed today.

## 2010-10-01 NOTE — Patient Instructions (Signed)
Your physician recommends that you schedule a follow-up appointment in: 3 months with Dr Johney Frame  Stop Aspirin

## 2010-10-01 NOTE — Assessment & Plan Note (Signed)
Stable No change required today  

## 2010-11-11 ENCOUNTER — Encounter (HOSPITAL_BASED_OUTPATIENT_CLINIC_OR_DEPARTMENT_OTHER)
Admission: RE | Admit: 2010-11-11 | Discharge: 2010-11-11 | Disposition: A | Discharge status: Home / Self Care | Payer: BC Managed Care – PPO | Source: Ambulatory Visit | Attending: Surgery | Admitting: Surgery

## 2010-11-11 LAB — BASIC METABOLIC PANEL
CO2: 28 mEq/L (ref 19–32)
Chloride: 103 mEq/L (ref 96–112)
Glucose, Bld: 99 mg/dL (ref 70–99)
Potassium: 4.2 mEq/L (ref 3.5–5.1)
Sodium: 139 mEq/L (ref 135–145)

## 2010-11-14 ENCOUNTER — Ambulatory Visit (HOSPITAL_BASED_OUTPATIENT_CLINIC_OR_DEPARTMENT_OTHER)
Admission: RE | Admit: 2010-11-14 | Discharge: 2010-11-14 | Disposition: A | Discharge status: Home / Self Care | Payer: BC Managed Care – PPO | Source: Ambulatory Visit | Attending: Surgery | Admitting: Surgery

## 2010-11-14 ENCOUNTER — Other Ambulatory Visit (INDEPENDENT_AMBULATORY_CARE_PROVIDER_SITE_OTHER): Payer: Self-pay | Admitting: Surgery

## 2010-11-14 DIAGNOSIS — D219 Benign neoplasm of connective and other soft tissue, unspecified: Secondary | ICD-10-CM

## 2010-11-14 DIAGNOSIS — D2360 Other benign neoplasm of skin of unspecified upper limb, including shoulder: Secondary | ICD-10-CM | POA: Insufficient documentation

## 2010-11-14 DIAGNOSIS — Z01812 Encounter for preprocedural laboratory examination: Secondary | ICD-10-CM | POA: Insufficient documentation

## 2010-11-14 LAB — POCT HEMOGLOBIN-HEMACUE: Hemoglobin: 13.9 g/dL (ref 13.0–17.0)

## 2010-11-19 NOTE — Op Note (Signed)
  NAMEWATSON, [name] NO.:  [id number]  MEDICAL RECORD NO.:  [id number]  LOCATION:                                 FACILITY:  PHYSICIAN:  Abigail Miyamoto, M.D. DATE OF BIRTH:  03/10/1946  DATE OF PROCEDURE:  11/14/2010 DATE OF DISCHARGE:                              OPERATIVE REPORT   PREOPERATIVE DIAGNOSIS:  A 2.5-cm right upper arm mass.  POSTOPERATIVE DIAGNOSIS:  A 2.5-cm right upper arm mass.  PROCEDURE:  Excision of 2.5-cm subcutaneous right upper arm mass.  SURGEON:  Abigail Miyamoto, MD.  ANESTHESIA:  General and 0.5% Marcaine.  ESTIMATED BLOOD LOSS:  Minimal.  INDICATIONS:  This is a 65 year old gentleman who presents with a mass in his right upper arm.  He had had an MRI of this.  This demonstrated a 2.2 x 1.7 x 2.3-cm mass within the subcutaneous sac showing both cystic and solid components.  Decision was made to remove this mass.  FINDINGS:  The patient was found to have a 2.5 cm cystic-appearing mass in the subcutaneous tissue of the right upper arm.  PROCEDURE IN DETAIL:  The patient was brought to the operating room, identified as [name] [name].  He was placed supine on the operating room table and general anesthesia was induced.  His right upper arm was then prepped and draped in usual sterile fashion.  The mass was on the inner part of the am.  I anesthetized the skin over top of the mass with 0.5% Marcaine with epinephrine.  I then made a longitudinal incision over the mass with a scalpel.  I took this down to the subcutaneous tissue with electrocautery.  The mass was identified and found to be both the muscle and as cystic in appearance.  I placed a hemostat proximal and distal to the mass on a small vessel and excised the mass in its entirety with electrocautery.  I then tied off the 2 clamps with 3-0 silk ties. Subcutaneous tissue was then further anesthetized with Marcaine.  I then closed subcutaneous tissue in 2 layers with  interrupted 3- 0 Vicryl sutures and closed the skin with a running 4-0 Monocryl.  Steri- Strips, gauze and tape were then applied.  The patient tolerated the procedure well.  All counts were correct at the end of the procedure. The patient was then extubated in operating room and taken in stable condition to recovery room.     Abigail Miyamoto, M.D.     DB/MEDQ  D:  11/14/2010  T:  11/14/2010  Job:  [id number]  Electronically Signed by Abigail Miyamoto M.D. on 11/19/2010 01:40:13 PM

## 2010-12-19 ENCOUNTER — Encounter (INDEPENDENT_AMBULATORY_CARE_PROVIDER_SITE_OTHER): Payer: BC Managed Care – PPO | Admitting: Surgery

## 2010-12-22 ENCOUNTER — Encounter (INDEPENDENT_AMBULATORY_CARE_PROVIDER_SITE_OTHER): Payer: BC Managed Care – PPO | Admitting: Surgery

## 2010-12-25 ENCOUNTER — Encounter (INDEPENDENT_AMBULATORY_CARE_PROVIDER_SITE_OTHER): Payer: Self-pay | Admitting: Surgery

## 2010-12-29 ENCOUNTER — Ambulatory Visit (INDEPENDENT_AMBULATORY_CARE_PROVIDER_SITE_OTHER): Payer: BC Managed Care – PPO | Admitting: Surgery

## 2010-12-29 ENCOUNTER — Encounter (INDEPENDENT_AMBULATORY_CARE_PROVIDER_SITE_OTHER): Payer: Self-pay | Admitting: Surgery

## 2010-12-29 VITALS — BP 130/88 | HR 66 | Temp 98.0°F | Resp 13 | Ht 74.5 in | Wt 218.6 lb

## 2010-12-29 DIAGNOSIS — Z09 Encounter for follow-up examination after completed treatment for conditions other than malignant neoplasm: Secondary | ICD-10-CM

## 2010-12-29 NOTE — Progress Notes (Signed)
Subjective:     Patient ID: [name] [name], male   DOB: [birth year], 65 y.o.   MRN: [id number]  HPI  He is here for his first postoperative visit status post removal of a right upper arm mass in August. Since then he has had a numb area just above and just below the elbow on the right arm. He has had no decreased sensation or strength or decrease in motor function. Review of Systems     Objective:   Physical Exam On examination, the incision is well-healed. 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 I gave him a copy of his pathology report.    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 Hopefully it will resolve time. I will see him back as needed.

## 2010-12-31 LAB — DIFFERENTIAL
Basophils Absolute: 0
Basophils Relative: 0
Monocytes Relative: 8
Neutro Abs: 5.8
Neutrophils Relative %: 67

## 2010-12-31 LAB — PROTIME-INR
INR: 1.8 — ABNORMAL HIGH
Prothrombin Time: 21.4 — ABNORMAL HIGH

## 2010-12-31 LAB — POCT I-STAT, CHEM 8
Calcium, Ion: 1.17
Creatinine, Ser: 1.2
Glucose, Bld: 105 — ABNORMAL HIGH
Hemoglobin: 16
Potassium: 4.3
TCO2: 28

## 2010-12-31 LAB — COMPREHENSIVE METABOLIC PANEL
Alkaline Phosphatase: 42
BUN: 19
Glucose, Bld: 114 — ABNORMAL HIGH
Potassium: 4.5
Total Bilirubin: 1.3 — ABNORMAL HIGH
Total Protein: 6.2

## 2010-12-31 LAB — MAGNESIUM: Magnesium: 2.3

## 2010-12-31 LAB — LIPID PANEL
HDL: 47
LDL Cholesterol: 128 — ABNORMAL HIGH
Total CHOL/HDL Ratio: 4
Triglycerides: 63
VLDL: 13

## 2010-12-31 LAB — POCT CARDIAC MARKERS
Myoglobin, poc: 141
Operator id: 257131

## 2010-12-31 LAB — CBC
Hemoglobin: 13.4
MCHC: 33.7
RDW: 13.7

## 2010-12-31 LAB — CK TOTAL AND CKMB (NOT AT ARMC): CK, MB: 4.9 — ABNORMAL HIGH

## 2010-12-31 LAB — APTT: aPTT: 34

## 2010-12-31 LAB — TSH: TSH: 1.63

## 2010-12-31 LAB — TROPONIN I: Troponin I: <0.01

## 2011-01-01 ENCOUNTER — Ambulatory Visit (INDEPENDENT_AMBULATORY_CARE_PROVIDER_SITE_OTHER): Payer: BC Managed Care – PPO | Admitting: Internal Medicine

## 2011-01-01 ENCOUNTER — Encounter: Payer: Self-pay | Admitting: Internal Medicine

## 2011-01-01 VITALS — BP 134/84 | HR 80 | Ht 74.0 in | Wt 218.0 lb

## 2011-01-01 DIAGNOSIS — I4891 Unspecified atrial fibrillation: Secondary | ICD-10-CM

## 2011-01-01 LAB — COMPREHENSIVE METABOLIC PANEL
ALT: 36
CO2: 23
Calcium: 9.8
Chloride: 107
Creatinine, Ser: 1
GFR calc non Af Amer: >60
Glucose, Bld: 109 — ABNORMAL HIGH
Sodium: 141
Total Bilirubin: 1.4 — ABNORMAL HIGH

## 2011-01-01 LAB — CBC
Hemoglobin: 14.6
MCHC: 33.3
MCV: 89.7
RBC: 4.89

## 2011-01-01 LAB — MAGNESIUM: Magnesium: 2.3

## 2011-01-01 NOTE — Progress Notes (Signed)
The patient presents today for routine electrophysiology followup.  Since his recent hospitalization, the patient reports doing very well.   He has had no further afib.  His is frustrated with difficulties sleeping and uncomfortable CPAP masks.  He has tried multiple masks without improvement.  He has daytime fatigue but feels that this may be due to poor sleep.  He has occasional mild SOB.  Today, he denies symptoms of palpitations, chest pain, orthopnea, PND, lower extremity edema, dizziness, presyncope, syncope, or neurologic sequela.  The patient feels that he is tolerating medications without difficulties and is otherwise without complaint today.   Past Medical History  Diagnosis Date  . Paroxysmal atrial fibrillation     s/p afib 2001 and 2009 by Dr Delena Serve  . Coronary artery disease     no significant CAD by cath 5/12  . Cardiomyopathy     hx of tachycardia mediated CM, now resolved  . Hypertension   . Hyperlipidemia   . Thoracic aortic aneurysm   . Depression   . Sleep apnea     mild   Past Surgical History  Procedure Date  . Cardiac catheterization 08/25/10    NO SIGNIFICANT CAD  . Cardioversion   . Elbow / upper arm foreign body removal     Current Outpatient Prescriptions  Medication Sig Dispense Refill  . atorvastatin (LIPITOR) 40 MG tablet Take 40 mg by mouth daily.        . dabigatran (PRADAXA) 150 MG CAPS Take 150 mg by mouth every 12 (twelve) hours.        . finasteride (PROPECIA) 1 MG tablet Take 1 mg by mouth daily.        . flecainide (TAMBOCOR) 100 MG tablet Take 100 mg by mouth 2 (two) times daily.        . metoprolol (TOPROL-XL) 100 MG 24 hr tablet 1/2 tablet every night      . Multiple Vitamin (MULTIVITAMIN) tablet Take 1 tablet by mouth daily.        . saw palmetto 160 MG capsule Take 160 mg by mouth 2 (two) times daily.          Allergies  Allergen Reactions  . Tetracyclines & Related     History   Social History  . Marital Status: Single    Spouse  Name: N/A    Number of Children: 0  . Years of Education: N/A   Occupational History  .  [employer]   Social History Main Topics  . Smoking status: Former Games developer  . Smokeless tobacco: Not on file  . Alcohol Use: Yes     2 bottles of wine per week  . Drug Use: No  . Sexually Active: Not on file   Other Topics Concern  . 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  He is single and has no children.    Family History  Problem Relation Age of Onset  . Coronary artery disease Father   . Stroke Mother     died  . Atrial fibrillation Brother    Physical Exam: Filed Vitals:   01/01/11 1610  BP: 134/84  Pulse: 80  Height: 6\' 2"  (1.88 m)  Weight: 218 lb (98.884 kg)    GEN- The patient is well appearing, alert and oriented x 3 today.  Head- normocephalic, atraumatic Eyes-  Sclera clear, conjunctiva pink Ears- hearing intact Oropharynx- clear Neck- supple, no JVP Lymph- no cervical lymphadenopathy Lungs- Clear to ausculation bilaterally, normal work of breathing Heart- Regular rate and rhythm, no murmurs, rubs or gallops, PMI not laterally displaced GI- soft, NT, ND, + BS Extremities- no clubbing, cyanosis, or edema MS- no significant deformity or atrophy Skin- no rash or lesion Psych- euthymic mood, full affect Neuro- strength and sensation are intact  Assessment and Plan:

## 2011-01-01 NOTE — Patient Instructions (Addendum)
Your physician wants you to follow-up in: 6 months with Dr Jacquiline Doe will receive a reminder letter in the mail two months in advance. If you don't receive a letter, please call our office to schedule the follow-up appointment.   Your physician has recommended you make the following change in your medication: 1) Decrease Metoprolol to 50mg  (1/2 100mg  tablet) every night

## 2011-01-01 NOTE — Assessment & Plan Note (Signed)
Doing very well at this time.  He is tolerating pradaxa, metoprolol and flecainide. I have decreased Toprol to 50mg  daily today. He will continue pradaxa at this time (CHADSVASC score is 1).  We may consider switching back to ASA in the future. If no further afib upon return, we could consider stopping flecainide at that time.  His recent hospitalization represents his first occurrence since afib ablation 2009. Lifestyle changes including weight loss, treatment of OSA, and avoidance of ETOH were again discussed today.

## 2011-01-02 LAB — CBC
HCT: 43.3
Hemoglobin: 14.5
MCHC: 33.5
MCV: 89.5
Platelets: 274
RBC: 4.83
RDW: 13.3
WBC: 7.1

## 2011-01-02 LAB — DIFFERENTIAL
Basophils Absolute: 0
Basophils Relative: 1
Eosinophils Absolute: 0.1
Eosinophils Relative: 2
Lymphocytes Relative: 29
Lymphs Abs: 2.1
Monocytes Absolute: 0.8
Monocytes Relative: 11
Neutro Abs: 4.1
Neutrophils Relative %: 58

## 2011-01-02 LAB — BASIC METABOLIC PANEL
BUN: 19
CO2: 29
Calcium: 9.8
Chloride: 106
Creatinine, Ser: 1.11
GFR calc Af Amer: >60
GFR calc non Af Amer: >60
Glucose, Bld: 104 — ABNORMAL HIGH
Potassium: 4.2
Sodium: 142

## 2011-01-02 LAB — PROTIME-INR
INR: 1.6 — ABNORMAL HIGH
Prothrombin Time: 20.1 — ABNORMAL HIGH

## 2011-01-02 LAB — APTT: aPTT: 34

## 2011-01-14 LAB — LIPID PANEL
HDL: 47
LDL Cholesterol: 92
Total CHOL/HDL Ratio: 3.4
Triglycerides: 106
VLDL: 21

## 2011-01-14 LAB — CBC
HCT: 44.8
Hemoglobin: 13.9
Hemoglobin: 14.6
MCV: 89.1
Platelets: 237
Platelets: 268
RBC: 4.73
RDW: 13.3
WBC: 10.1
WBC: 6.9

## 2011-01-14 LAB — I-STAT 8, (EC8 V) (CONVERTED LAB)
BUN: 18
Chloride: 107
Hemoglobin: 15.3
Operator id: 133351
Potassium: 4.3
Sodium: 140

## 2011-01-14 LAB — DIFFERENTIAL
Basophils Absolute: 0
Eosinophils Absolute: 0.1
Lymphocytes Relative: 19
Lymphocytes Relative: 21
Lymphs Abs: 2
Monocytes Absolute: 0.9 — ABNORMAL HIGH
Monocytes Relative: 10
Monocytes Relative: 9
Neutro Abs: 6.2
Neutrophils Relative %: 68
Neutrophils Relative %: 71

## 2011-01-14 LAB — BASIC METABOLIC PANEL
BUN: 17
Chloride: 104
Glucose, Bld: 103 — ABNORMAL HIGH
Potassium: 4.3
Sodium: 141

## 2011-01-14 LAB — APTT: aPTT: 29

## 2011-01-14 LAB — POCT I-STAT CREATININE: Creatinine, Ser: 1

## 2011-01-14 LAB — TSH: TSH: 1.643

## 2011-01-14 LAB — CARDIAC PANEL(CRET KIN+CKTOT+MB+TROPI)
CK, MB: 2
CK, MB: 2.6
Total CK: 94
Troponin I: <0.01

## 2011-01-14 LAB — MAGNESIUM: Magnesium: 2.2

## 2011-01-14 LAB — PROTIME-INR
INR: 0.9
Prothrombin Time: 12.6

## 2011-01-14 LAB — CK TOTAL AND CKMB (NOT AT ARMC): Relative Index: 2.5

## 2011-01-14 LAB — TROPONIN I: Troponin I: 0.01

## 2011-08-28 ENCOUNTER — Encounter: Payer: Self-pay | Admitting: Internal Medicine

## 2011-08-28 ENCOUNTER — Telehealth: Payer: Self-pay | Admitting: Internal Medicine

## 2011-08-28 NOTE — Telephone Encounter (Signed)
08-28-11 lmm @ 837am to make a 6 month office visit with allred due in march/mt

## 2011-09-05 ENCOUNTER — Encounter (HOSPITAL_COMMUNITY): Payer: Self-pay

## 2011-09-05 ENCOUNTER — Inpatient Hospital Stay (HOSPITAL_COMMUNITY)
Admission: EM | Admit: 2011-09-05 | Discharge: 2011-09-08 | DRG: 914 | Disposition: A | Discharge status: Home / Self Care | Payer: Medicare Other | Attending: Internal Medicine | Admitting: Internal Medicine

## 2011-09-05 ENCOUNTER — Emergency Department (HOSPITAL_COMMUNITY): Payer: Medicare Other

## 2011-09-05 DIAGNOSIS — Y998 Other external cause status: Secondary | ICD-10-CM

## 2011-09-05 DIAGNOSIS — I429 Cardiomyopathy, unspecified: Secondary | ICD-10-CM

## 2011-09-05 DIAGNOSIS — W268XXA Contact with other sharp object(s), not elsewhere classified, initial encounter: Secondary | ICD-10-CM | POA: Diagnosis present

## 2011-09-05 DIAGNOSIS — I10 Essential (primary) hypertension: Secondary | ICD-10-CM | POA: Diagnosis present

## 2011-09-05 DIAGNOSIS — G4730 Sleep apnea, unspecified: Secondary | ICD-10-CM | POA: Diagnosis present

## 2011-09-05 DIAGNOSIS — I4891 Unspecified atrial fibrillation: Secondary | ICD-10-CM

## 2011-09-05 DIAGNOSIS — L02419 Cutaneous abscess of limb, unspecified: Secondary | ICD-10-CM

## 2011-09-05 DIAGNOSIS — S91309A Unspecified open wound, unspecified foot, initial encounter: Principal | ICD-10-CM | POA: Diagnosis present

## 2011-09-05 DIAGNOSIS — I891 Lymphangitis: Secondary | ICD-10-CM

## 2011-09-05 DIAGNOSIS — Z87891 Personal history of nicotine dependence: Secondary | ICD-10-CM

## 2011-09-05 DIAGNOSIS — I480 Paroxysmal atrial fibrillation: Secondary | ICD-10-CM

## 2011-09-05 DIAGNOSIS — I428 Other cardiomyopathies: Secondary | ICD-10-CM | POA: Diagnosis present

## 2011-09-05 DIAGNOSIS — L02619 Cutaneous abscess of unspecified foot: Secondary | ICD-10-CM | POA: Diagnosis present

## 2011-09-05 DIAGNOSIS — L03119 Cellulitis of unspecified part of limb: Secondary | ICD-10-CM

## 2011-09-05 DIAGNOSIS — L0390 Cellulitis, unspecified: Secondary | ICD-10-CM

## 2011-09-05 LAB — CBC
Hemoglobin: 13.3 g/dL (ref 13.0–17.0)
MCH: 29.2 pg (ref 26.0–34.0)
MCHC: 33 g/dL (ref 30.0–36.0)
RDW: 13.4 % (ref 11.5–15.5)

## 2011-09-05 LAB — BASIC METABOLIC PANEL
BUN: 17 mg/dL (ref 6–23)
Chloride: 99 mEq/L (ref 96–112)
Creatinine, Ser: 0.93 mg/dL (ref 0.50–1.35)
GFR calc Af Amer: >90 mL/min (ref >90)
GFR calc non Af Amer: 87 mL/min — ABNORMAL LOW (ref >90)
Potassium: 4 mEq/L (ref 3.5–5.1)

## 2011-09-05 LAB — DIFFERENTIAL
Basophils Absolute: 0 10*3/uL (ref 0.0–0.1)
Basophils Relative: 0 % (ref 0–1)
Eosinophils Absolute: 0.1 10*3/uL (ref 0.0–0.7)
Monocytes Absolute: 1.4 10*3/uL — ABNORMAL HIGH (ref 0.1–1.0)
Monocytes Relative: 13 % — ABNORMAL HIGH (ref 3–12)
Neutro Abs: 7.7 10*3/uL (ref 1.7–7.7)
Neutrophils Relative %: 70 % (ref 43–77)

## 2011-09-05 MED ORDER — ONDANSETRON HCL 4 MG/2ML IJ SOLN: 4.0000 mg | Freq: Four times a day (QID) | INTRAMUSCULAR | Status: DC | PRN

## 2011-09-05 MED ORDER — SODIUM CHLORIDE 0.9 % IV SOLN: 250.0000 mL | INTRAVENOUS | Status: DC | PRN

## 2011-09-05 MED ORDER — SAW PALMETTO (SERENOA REPENS) 160 MG PO CAPS: 160.0000 mg | ORAL_CAPSULE | Freq: Two times a day (BID) | ORAL | Status: DC

## 2011-09-05 MED ORDER — SODIUM CHLORIDE 0.9 % IJ SOLN
3.0000 mL | Freq: Two times a day (BID) | INTRAMUSCULAR | Status: DC
  Administered 2011-09-07: 3 mL via INTRAVENOUS

## 2011-09-05 MED ORDER — HYDROCODONE-ACETAMINOPHEN 5-325 MG PO TABS
1.0000 | ORAL_TABLET | ORAL | Status: DC | PRN
  Administered 2011-09-07: 2 via ORAL
  Filled 2011-09-05: qty 2

## 2011-09-05 MED ORDER — ACETAMINOPHEN 325 MG PO TABS
650.0000 mg | ORAL_TABLET | Freq: Four times a day (QID) | ORAL | Status: DC | PRN
  Administered 2011-09-06 (×2): 650 mg via ORAL
  Filled 2011-09-05 (×2): qty 2

## 2011-09-05 MED ORDER — ONDANSETRON HCL 4 MG/2ML IJ SOLN: 4.0000 mg | Freq: Three times a day (TID) | INTRAMUSCULAR | Status: AC | PRN

## 2011-09-05 MED ORDER — VITAMIN D (ERGOCALCIFEROL) 1.25 MG (50000 UNIT) PO CAPS
50000.0000 [IU] | ORAL_CAPSULE | ORAL | Status: DC
  Administered 2011-09-06: 50000 [IU] via ORAL
  Filled 2011-09-05: qty 1

## 2011-09-05 MED ORDER — TETANUS-DIPHTHERIA TOXOIDS TD 5-2 LFU IM INJ
0.5000 mL | INJECTION | Freq: Once | INTRAMUSCULAR | Status: AC
  Administered 2011-09-05: 0.5 mL via INTRAMUSCULAR
  Filled 2011-09-05: qty 0.5

## 2011-09-05 MED ORDER — FINASTERIDE 1 MG PO TABS: 1.0000 mg | ORAL_TABLET | Freq: Every morning | ORAL | Status: DC

## 2011-09-05 MED ORDER — SODIUM CHLORIDE 0.9 % IJ SOLN
3.0000 mL | Freq: Two times a day (BID) | INTRAMUSCULAR | Status: DC
  Administered 2011-09-07 – 2011-09-08 (×2): 3 mL via INTRAVENOUS

## 2011-09-05 MED ORDER — ATORVASTATIN CALCIUM 40 MG PO TABS
40.0000 mg | ORAL_TABLET | Freq: Every day | ORAL | Status: DC
  Administered 2011-09-05 – 2011-09-07 (×3): 40 mg via ORAL
  Filled 2011-09-05 (×4): qty 1

## 2011-09-05 MED ORDER — ZOLPIDEM TARTRATE 5 MG PO TABS
10.0000 mg | ORAL_TABLET | Freq: Every evening | ORAL | Status: DC | PRN
  Administered 2011-09-05 – 2011-09-07 (×3): 10 mg via ORAL
  Filled 2011-09-05 (×3): qty 2

## 2011-09-05 MED ORDER — SODIUM CHLORIDE 0.9 % IJ SOLN: 3.0000 mL | INTRAMUSCULAR | Status: DC | PRN

## 2011-09-05 MED ORDER — VANCOMYCIN HCL 1000 MG IV SOLR
1250.0000 mg | Freq: Two times a day (BID) | INTRAVENOUS | Status: DC
  Administered 2011-09-06 – 2011-09-07 (×3): 1250 mg via INTRAVENOUS
  Filled 2011-09-05 (×4): qty 1250

## 2011-09-05 MED ORDER — DABIGATRAN ETEXILATE MESYLATE 150 MG PO CAPS
150.0000 mg | ORAL_CAPSULE | Freq: Two times a day (BID) | ORAL | Status: DC
  Administered 2011-09-05 – 2011-09-06 (×2): 150 mg via ORAL
  Filled 2011-09-05 (×3): qty 1

## 2011-09-05 MED ORDER — ACETAMINOPHEN 650 MG RE SUPP: 650.0000 mg | Freq: Four times a day (QID) | RECTAL | Status: DC | PRN

## 2011-09-05 MED ORDER — SODIUM CHLORIDE 0.9 % IV SOLN
INTRAVENOUS | Status: AC
  Administered 2011-09-05: 22:00:00 via INTRAVENOUS

## 2011-09-05 MED ORDER — VANCOMYCIN HCL IN DEXTROSE 1-5 GM/200ML-% IV SOLN
1000.0000 mg | Freq: Once | INTRAVENOUS | Status: AC
  Administered 2011-09-05: 1000 mg via INTRAVENOUS
  Filled 2011-09-05: qty 200

## 2011-09-05 MED ORDER — FLECAINIDE ACETATE 100 MG PO TABS
100.0000 mg | ORAL_TABLET | Freq: Two times a day (BID) | ORAL | Status: DC
  Administered 2011-09-05 – 2011-09-08 (×6): 100 mg via ORAL
  Filled 2011-09-05 (×7): qty 1

## 2011-09-05 MED ORDER — DEXTROSE 5 % IV SOLN
1.0000 g | Freq: Once | INTRAVENOUS | Status: AC
  Administered 2011-09-05: 1 g via INTRAVENOUS
  Filled 2011-09-05: qty 10

## 2011-09-05 MED ORDER — ONDANSETRON HCL 4 MG PO TABS: 4.0000 mg | ORAL_TABLET | Freq: Four times a day (QID) | ORAL | Status: DC | PRN

## 2011-09-05 MED ORDER — METOPROLOL SUCCINATE ER 50 MG PO TB24
50.0000 mg | ORAL_TABLET | Freq: Every day | ORAL | Status: DC
  Administered 2011-09-05 – 2011-09-07 (×3): 50 mg via ORAL
  Filled 2011-09-05 (×4): qty 1

## 2011-09-05 NOTE — ED Notes (Signed)
Dr. Jillyn Hidden at bedside to assess patinent

## 2011-09-05 NOTE — ED Notes (Signed)
Report called pt ready for transfer to [facility]

## 2011-09-05 NOTE — H&P (Signed)
PCP:   Beverley Fiedler, MD, MD   Chief Complaint: Cellulitis   HPI: [name] [name] is an 66 y.o. male with history of cardiomyopathy, paroxysmal atrial fibrillation on Pradaxa, status post atrial ablation, depression, hypertension, hyperlipidemia, stepped on a broken piece of glass 3 days prior to presentation to the emergency room. He noted swelling of his left foot with proximal radiation toward his left neck. He denied any fever or chills. He is not sure of his last tetanus shot. Evaluation in emergency room included plain film which showed no retained foreign body, nor any evidence of soft tissues gas. He has leukocytosis with white count of 11,000 and normal renal function tests. He is not diabetic. Hospitalist was asked to admit patient for cellulitis. He was given vancomycin and Rocephin in emergency room.  Rewiew of Systems:  The patient denies anorexia, fever, weight loss,, vision loss, decreased hearing, hoarseness, chest pain, syncope, dyspnea on exertion, peripheral edema, balance deficits, hemoptysis, abdominal pain, melena, hematochezia, severe indigestion/heartburn, hematuria, incontinence, genital sores, muscle weakness, suspicious skin lesions, transient blindness, difficulty walking, depression, unusual weight change, abnormal bleeding, enlarged lymph nodes, angioedema, and breast masses.    Past Medical History  Diagnosis Date  . Paroxysmal atrial fibrillation     s/p afib 2001 and 2009 by Dr Delena Serve  . Coronary artery disease     no significant CAD by cath 5/12  . Cardiomyopathy     hx of tachycardia mediated CM, now resolved  . Hypertension   . Hyperlipidemia   . Thoracic aortic aneurysm   . Depression   . Sleep apnea     mild    Past Surgical History  Procedure Date  . Cardiac catheterization 08/25/10    NO SIGNIFICANT CAD  . Cardioversion   . Elbow / upper arm foreign body removal     Medications:  HOME MEDS: Prior to Admission medications   Medication  Sig Start Date End Date Taking? Authorizing Provider  atorvastatin (LIPITOR) 40 MG tablet Take 40 mg by mouth at bedtime.    Yes Historical Provider, MD  dabigatran (PRADAXA) 150 MG CAPS Take 150 mg by mouth every 12 (twelve) hours.     Yes Historical Provider, MD  finasteride (PROPECIA) 1 MG tablet Take 1 mg by mouth every morning.    Yes Historical Provider, MD  flecainide (TAMBOCOR) 100 MG tablet Take 100 mg by mouth 2 (two) times daily.     Yes Historical Provider, MD  metoprolol succinate (TOPROL-XL) 50 MG 24 hr tablet Take 50 mg by mouth at bedtime. Take with or immediately following a meal.   Yes Historical Provider, MD  saw palmetto 160 MG capsule Take 160 mg by mouth 2 (two) times daily.     Yes Historical Provider, MD  Vitamin D, Ergocalciferol, (DRISDOL) 50000 UNITS CAPS Take 50,000 Units by mouth every 7 (seven) days. Takes on Sunday   Yes Historical Provider, MD     Allergies:  Allergies  Allergen Reactions  . Tetracyclines & Related Hives    Social History:   reports that he has quit smoking. He does not have any smokeless tobacco history on file. He reports that he drinks alcohol. He reports that he does not use illicit drugs.  Family History: Family History  Problem Relation Age of Onset  . Coronary artery disease Father   . Stroke Mother     died  . Atrial fibrillation Brother      Physical Exam: Filed Vitals:   09/05/11 1641 09/05/11  1753 09/05/11 2025  BP: 137/84 126/74 131/80  Pulse: 94 84 75  Temp: 98.5 F (36.9 C) 98.8 F (37.1 C) 98.6 F (37 C)  TempSrc:  Oral Oral  Resp: 16 17 16   SpO2: 96% 96% 99%   Blood pressure 131/80, pulse 75, temperature 98.6 F (37 C), temperature source Oral, resp. rate 16, SpO2 99.00%.  GEN:  Pleasant  person lying in the stretcher in no acute distress; cooperative with exam PSYCH:  alert and oriented x4; does not appear anxious or depressed; affect is appropriate. HEENT: Mucous membranes pink and anicteric; PERRLA;  EOM intact; no cervical lymphadenopathy nor thyromegaly or carotid bruit; no JVD; Breasts:: Not examined CHEST WALL: No tenderness CHEST: Normal respiration, clear to auscultation bilaterally HEART: Regular rate and rhythm; no murmurs rubs or gallops BACK: No kyphosis or scoliosis; no CVA tenderness ABDOMEN: Obese, soft non-tender; no masses, no organomegaly, normal abdominal bowel sounds; no pannus; no intertriginous candida. Rectal Exam: Not done EXTREMITIES: No bone or joint deformity; age-appropriate arthropathy of the hands and knees; ; no ulcerations. There is erythema, warmth, swelling of his left foot with proximal radiation. I demarcate the level of erythema, timed and dated. Genitalia: not examined PULSES: 2+ and symmetric SKIN: Normal hydration no rash or ulceration CNS: Cranial nerves 2-12 grossly intact no focal lateralizing neurologic deficit   Labs & Imaging Results for orders placed during the hospital encounter of 09/05/11 (from the past 48 hour(s))  CBC     Status: Abnormal   Collection Time   09/05/11  5:33 PM      Component Value Range Comment   WBC 11.0 (*) 4.0 - 10.5 (K/uL)    RBC 4.56  4.22 - 5.81 (MIL/uL)    Hemoglobin 13.3  13.0 - 17.0 (g/dL)    HCT 16.1  09.6 - 04.5 (%)    MCV 88.4  78.0 - 100.0 (fL)    MCH 29.2  26.0 - 34.0 (pg)    MCHC 33.0  30.0 - 36.0 (g/dL)    RDW 40.9  81.1 - 91.4 (%)    Platelets 211  150 - 400 (K/uL)   DIFFERENTIAL     Status: Abnormal   Collection Time   09/05/11  5:33 PM      Component Value Range Comment   Neutrophils Relative 70  43 - 77 (%)    Neutro Abs 7.7  1.7 - 7.7 (K/uL)    Lymphocytes Relative 17  12 - 46 (%)    Lymphs Abs 1.8  0.7 - 4.0 (K/uL)    Monocytes Relative 13 (*) 3 - 12 (%)    Monocytes Absolute 1.4 (*) 0.1 - 1.0 (K/uL)    Eosinophils Relative 1  0 - 5 (%)    Eosinophils Absolute 0.1  0.0 - 0.7 (K/uL)    Basophils Relative 0  0 - 1 (%)    Basophils Absolute 0.0  0.0 - 0.1 (K/uL)   BASIC METABOLIC PANEL      Status: Abnormal   Collection Time   09/05/11  5:33 PM      Component Value Range Comment   Sodium 136  135 - 145 (mEq/L)    Potassium 4.0  3.5 - 5.1 (mEq/L)    Chloride 99  96 - 112 (mEq/L)    CO2 27  19 - 32 (mEq/L)    Glucose, Bld 90  70 - 99 (mg/dL)    BUN 17  6 - 23 (mg/dL)    Creatinine, Ser 7.82  0.50 - 1.35 (mg/dL)    Calcium 9.7  8.4 - 10.5 (mg/dL)    GFR calc non Af Amer 87 (*) >90 (mL/min)    GFR calc Af Amer >90  >90 (mL/min)    Dg Foot Complete Left  09/05/2011  *RADIOLOGY REPORT*  Clinical Data: 66 year old male with pain and redness.  Swelling. Puncture wound 2 days ago.  LEFT FOOT - COMPLETE 3+ VIEW  Comparison: None.  Findings: No radiopaque foreign body identified.  No subcutaneous gas.  Hallux valgus with metatarsus primus varus.  Joint spaces preserved.  No fracture or dislocation.  IMPRESSION: No retained radiopaque foreign body or subcutaneous gas.  No fracture or dislocation identified.  Original Report Authenticated By: Harley Hallmark, M.D.      Assessment Present on Admission:  .PAF (paroxysmal atrial fibrillation) .Cellulitis .HTN (hypertension) .Cardiomyopathy   PLAN: Cellulitis. I believe vancomycin alone is adequate. He was given tetanus and diphtheria toxoid as well. I have continue his cardiac medications including Pradaxa and Fleichnide. He will be given pain medication along with a sleeping pill as per his request. He is stable, full code, and will be admitted to triad hospitalist service.   Other plans as per orders.    Areana Kosanke 09/05/2011, 9:22 PM

## 2011-09-05 NOTE — Progress Notes (Signed)
ANTIBIOTIC CONSULT NOTE - INITIAL  Pharmacy Consult for Vancomycin Indication: cellulits  Allergies  Allergen Reactions  . Tetracyclines & Related Hives    Patient Measurements:  Body Weight = 96kg   Vital Signs: Temp: 97.8 F (36.6 C) (06/01 2215) Temp src: Oral (06/01 2215) BP: 138/76 mmHg (06/01 2215) Pulse Rate: 76  (06/01 2215) Intake/Output from previous day:   Intake/Output from this shift:    Labs:  Basename 09/05/11 1733  WBC 11.0*  HGB 13.3  PLT 211  LABCREA --  CREATININE 0.93   The CrCl is unknown because both a height and weight (above a minimum accepted value) are required for this calculation. No results found for this basename: VANCOTROUGH:2,VANCOPEAK:2,VANCORANDOM:2,GENTTROUGH:2,GENTPEAK:2,GENTRANDOM:2,TOBRATROUGH:2,TOBRAPEAK:2,TOBRARND:2,AMIKACINPEAK:2,AMIKACINTROU:2,AMIKACIN:2, in the last 72 hours   Microbiology: No results found for this or any previous visit (from the past 720 hour(s)).  Medical History: Past Medical History  Diagnosis Date  . Paroxysmal atrial fibrillation     s/p afib 2001 and 2009 by Dr Delena Serve  . Coronary artery disease     no significant CAD by cath 5/12  . Cardiomyopathy     hx of tachycardia mediated CM, now resolved  . Hypertension   . Hyperlipidemia   . Thoracic aortic aneurysm   . Depression   . Sleep apnea     mild    Assessment: 70 YOM presented with left leg cellulitis to start vancomycin. Scr 0.93, est, crcl ~ 85. Blood cultures pending. Patient received one dose of vancomycin 1g in the ED at 1830  Goal of Therapy:  Vancomycin trough level 10-15 mcg/ml  Plan:  - Vancomycin 1250mg  IV Q 12hrs next dose at 5am - f/u renal function and cultures  Bayard Hugger, PharmD, BCPS  Clinical Pharmacist  Pager: [phone]  09/05/2011,10:18 PM

## 2011-09-05 NOTE — ED Notes (Signed)
Got pt's pants off and in a gown. 5.00 pm JG.

## 2011-09-05 NOTE — ED Notes (Signed)
Pt stated that he stepped on a piece of broken glass last night Thursday night and then Friday, he noted the redness and swelling to his lt foot/leg that got progressively worse.

## 2011-09-05 NOTE — Progress Notes (Signed)
PHARMACIST - PHYSICIAN ORDER COMMUNICATION  CONCERNING: P&T Medication Policy on Herbal Medications  DESCRIPTION:  This patient's order for: saw palmetto and Propecia has been noted.  saw palmetto is classified as an "herbal" or natural product. Due to a lack of definitive safety studies or FDA approval, nonstandard manufacturing practices, plus the potential risk of unknown drug-drug interactions while on inpatient medications, the Pharmacy and Therapeutics Committee does not permit the use of "herbal" or natural products of this type within [facility].  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 This order has been rejected, patient can bring the medication from home if need to take it while in the hospital.   ACTION TAKEN: The pharmacy department is unable to verify this order at this time and your patient has been informed of this safety policy. Please reevaluate patient's clinical condition at discharge and address if the herbal or natural product(s) should be resumed at that time.

## 2011-09-05 NOTE — ED Notes (Signed)
Admitting MD at bedside.

## 2011-09-05 NOTE — ED Notes (Signed)
First contact with pt. Pt alert x 3 respirations easy non labored.  NAD. Pt with outlined redness noted to foot non extended. Pt informed of plan of care.

## 2011-09-05 NOTE — ED Notes (Signed)
Attempt to call report nurse unavailable

## 2011-09-05 NOTE — ED Notes (Signed)
Thursday night cut foot on glass now with infection to left foot.

## 2011-09-05 NOTE — Consult Note (Signed)
Reason for Consult: Left foot infection Referring Physician: PCP  [name] [name] is an 66 y.o. male.  HPI: Stepped on large broken piece of a wine coaster last night. Removed the piece and cleaned with peroxide. Noticed redness this AM.  Past Medical History  Diagnosis Date  . Paroxysmal atrial fibrillation     s/p afib 2001 and 2009 by Dr [name] [name]  . Coronary artery disease     no significant CAD by cath 5/12  . Cardiomyopathy     hx of tachycardia mediated CM, now resolved  . Hypertension   . Hyperlipidemia   . Thoracic aortic aneurysm   . Depression   . Sleep apnea     mild    Past Surgical History  Procedure Date  . Cardiac catheterization 08/25/10    NO SIGNIFICANT CAD  . Cardioversion   . Elbow / upper arm foreign body removal     Family History  Problem Relation Age of Onset  . Coronary artery disease Father   . Stroke Mother     died  . Atrial fibrillation Brother     Social History:  reports that he has quit smoking. He does not have any smokeless tobacco history on file. He reports that he drinks alcohol. He reports that he does not use illicit drugs.  Allergies:  Allergies  Allergen Reactions  . Tetracyclines & Related Hives    Medications: I have reviewed the patient's current medications.  Results for orders placed during the hospital encounter of 09/05/11 (from the past 48 hour(s))  CBC     Status: Abnormal   Collection Time   09/05/11  5:33 PM      Component Value Range Comment   WBC 11.0 (*) 4.0 - 10.5 (K/uL)    RBC 4.56  4.22 - 5.81 (MIL/uL)    Hemoglobin 13.3  13.0 - 17.0 (g/dL)    HCT 16.1  09.6 - 04.5 (%)    MCV 88.4  78.0 - 100.0 (fL)    MCH 29.2  26.0 - 34.0 (pg)    MCHC 33.0  30.0 - 36.0 (g/dL)    RDW 40.9  81.1 - 91.4 (%)    Platelets 211  150 - 400 (K/uL)   DIFFERENTIAL     Status: Abnormal   Collection Time   09/05/11  5:33 PM      Component Value Range Comment   Neutrophils Relative 70  43 - 77 (%)    Neutro Abs 7.7  1.7 - 7.7  (K/uL)    Lymphocytes Relative 17  12 - 46 (%)    Lymphs Abs 1.8  0.7 - 4.0 (K/uL)    Monocytes Relative 13 (*) 3 - 12 (%)    Monocytes Absolute 1.4 (*) 0.1 - 1.0 (K/uL)    Eosinophils Relative 1  0 - 5 (%)    Eosinophils Absolute 0.1  0.0 - 0.7 (K/uL)    Basophils Relative 0  0 - 1 (%)    Basophils Absolute 0.0  0.0 - 0.1 (K/uL)   BASIC METABOLIC PANEL     Status: Abnormal   Collection Time   09/05/11  5:33 PM      Component Value Range Comment   Sodium 136  135 - 145 (mEq/L)    Potassium 4.0  3.5 - 5.1 (mEq/L)    Chloride 99  96 - 112 (mEq/L)    CO2 27  19 - 32 (mEq/L)    Glucose, Bld 90  70 - 99 (mg/dL)  BUN 17  6 - 23 (mg/dL)    Creatinine, Ser 1.61  0.50 - 1.35 (mg/dL)    Calcium 9.7  8.4 - 10.5 (mg/dL)    GFR calc non Af Amer 87 (*) >90 (mL/min)    GFR calc Af Amer >90  >90 (mL/min)     Dg Foot Complete Left  09/05/2011  *RADIOLOGY REPORT*  Clinical Data: 66 year old male with pain and redness.  Swelling. Puncture wound 2 days ago.  LEFT FOOT - COMPLETE 3+ VIEW  Comparison: None.  Findings: No radiopaque foreign body identified.  No subcutaneous gas.  Hallux valgus with metatarsus primus varus.  Joint spaces preserved.  No fracture or dislocation.  IMPRESSION: No retained radiopaque foreign body or subcutaneous gas.  No fracture or dislocation identified.  Original Report Authenticated By: [name] [name], M.D.    Review of Systems  Constitutional: Negative.   HENT: Negative.   Eyes: Negative.   Respiratory: Negative.   Cardiovascular: Negative.   Gastrointestinal: Negative.   Genitourinary: Negative.   Musculoskeletal:       Left foot pain  Skin:       Erythema left foot  Neurological: Negative.   Endo/Heme/Allergies: Negative.   Psychiatric/Behavioral: Negative.    Blood pressure 126/74, pulse 84, temperature 98.8 F (37.1 C), temperature source Oral, resp. rate 17, SpO2 96.00%. Physical Exam  Vitals reviewed. Constitutional: He is oriented to person, place, and  time. He appears well-developed.  HENT:  Head: Normocephalic.  Eyes: Pupils are equal, round, and reactive to light.  Neck: Normal range of motion.  Cardiovascular: Normal rate.   Respiratory: Effort normal.  GI: Soft.  Musculoskeletal: He exhibits tenderness.       1cm puncture wound left medial arch foot. No drainage. Tender to palpation. No sign of abscess. No pain with ROM ankle or knee. Compartments soft. Swelling foot with erythema arch. 2+ pulses.  Neurological: He is alert and oriented to person, place, and time.  Skin: There is erythema.       Erythema left foot extending to proximal calf Distal thigh.  Psychiatric: He has a normal mood and affect.    Assessment/Plan: Infection left foot secondary to puncture wound from large piece of glass. Cellulitis extending to medial aspect of distal thigh. No  Drainage or sign of abscess. No gas or foreign body seen on xray. Recommend broad spectrum IV antibiotics, admission and observation. Agree with Vancomycin and Rocephin. Discussed with patient. Dr. Victorino [name] to see in AM. CBC in AM.  [name] [name] C cell # [id number].  beeper [phone]. 09/05/2011, 8:11 PM

## 2011-09-05 NOTE — ED Provider Notes (Signed)
History   This chart was scribed for Hilario Quarry, MD by Toya Smothers. The patient was seen in room STRE1/STRE1. Patient's care was started at 1636.  CSN: [id number]  Arrival date & time 09/05/11  1636  First MD Initiated Contact with Patient 09/05/11 1658     Chief Complaint  Patient presents with  . Abscess   HPI  [name] [name] is a 66 y.o. male who presents to the Emergency Department complaining of constant moderate severe swelling and redness in left foot with associated swelling, redness, denying fever. Pt states that he stepped on a broken glass in his kitchen 2 days ago and cleaned the wound to his left foot with peroxide, this morning he noticed swelling in his foot and redness.Pt states that he went to urgent care and was directed to be evaluated at the Emergency Department. Pt list allergies to TETRACYCLINES, and has a h/o Paroxysmal atrial fibrillation, Cardiomyopathy, and Hypertension.  Pt list PCP as Dr. Gildardo Griffes and Reece Agar  Past Medical History  Diagnosis Date  . Paroxysmal atrial fibrillation     s/p afib 2001 and 2009 by Dr Delena Serve  . Coronary artery disease     no significant CAD by cath 5/12  . Cardiomyopathy     hx of tachycardia mediated CM, now resolved  . Hypertension   . Hyperlipidemia   . Thoracic aortic aneurysm   . Depression   . Sleep apnea     mild   Past Surgical History  Procedure Date  . Cardiac catheterization 08/25/10    NO SIGNIFICANT CAD  . Cardioversion   . Elbow / upper arm foreign body removal    Family History  Problem Relation Age of Onset  . Coronary artery disease Father   . Stroke Mother     died  . Atrial fibrillation Brother    History  Substance Use Topics  . Smoking status: Former Games developer  . Smokeless tobacco: Not on file  . Alcohol Use: Yes     2 bottles of wine per week    Review of Systems  Constitutional: Negative for fever.  Musculoskeletal:       Swelling in L foot and leg  Skin: Positive for color change  (redness).    Allergies  Tetracyclines & related  Home Medications   Current Outpatient Rx  Name Route Sig Dispense Refill  . ATORVASTATIN CALCIUM 40 MG PO TABS Oral Take 40 mg by mouth at bedtime.     Marland Kitchen DABIGATRAN ETEXILATE MESYLATE 150 MG PO CAPS Oral Take 150 mg by mouth every 12 (twelve) hours.      Marland Kitchen FINASTERIDE 1 MG PO TABS Oral Take 1 mg by mouth every morning.     Marland Kitchen FLECAINIDE ACETATE 100 MG PO TABS Oral Take 100 mg by mouth 2 (two) times daily.      Marland Kitchen METOPROLOL SUCCINATE ER 50 MG PO TB24 Oral Take 50 mg by mouth at bedtime. Take with or immediately following a meal.    . SAW PALMETTO (SERENOA REPENS) 160 MG PO CAPS Oral Take 160 mg by mouth 2 (two) times daily.      Marland Kitchen VITAMIN D (ERGOCALCIFEROL) 50000 UNITS PO CAPS Oral Take 50,000 Units by mouth every 7 (seven) days. Takes on Sunday      BP 126/74  Pulse 84  Temp(Src) 98.8 F (37.1 C) (Oral)  Resp 17  SpO2 96%  Physical Exam  Cardiovascular: Normal rate, regular rhythm and normal heart sounds.  Exam reveals  no gallop and no friction rub.   No murmur heard. Musculoskeletal:       Diffuse swelling to L foot and leg.   Skin: There is erythema (L leg).    ED Course  Procedures (including critical care time)  DIAGNOSTIC STUDIES: Oxygen Saturation is 96% on room air, adequate by my interpretation.    COORDINATION OF CARE: 5:07PM- Evaluated state of Pt's present illness 7:48PM- Consult   Labs Reviewed  CBC - Abnormal; Notable for the following:    WBC 11.0 (*)    All other components within normal limits  DIFFERENTIAL - Abnormal; Notable for the following:    Monocytes Relative 13 (*)    Monocytes Absolute 1.4 (*)    All other components within normal limits  BASIC METABOLIC PANEL - Abnormal; Notable for the following:    GFR calc non Af Amer 87 (*)    All other components within normal limits  CULTURE, BLOOD (ROUTINE X 2)  CULTURE, BLOOD (ROUTINE X 2)   Dg Foot Complete Left  09/05/2011  *RADIOLOGY  REPORT*  Clinical Data: 66 year old male with pain and redness.  Swelling. Puncture wound 2 days ago.  LEFT FOOT - COMPLETE 3+ VIEW  Comparison: None.  Findings: No radiopaque foreign body identified.  No subcutaneous gas.  Hallux valgus with metatarsus primus varus.  Joint spaces preserved.  No fracture or dislocation.  IMPRESSION: No retained radiopaque foreign body or subcutaneous gas.  No fracture or dislocation identified.  Original Report Authenticated By: Harley Hallmark, M.D.    No diagnosis found.   MDM  I personally performed the services described in this documentation, which was scribed in my presence. The recorded information has been reviewed and considered.  Patient's care discussed with ortho and hospitalist.  Dr. Rennis Chris saw and evaluated in ed.  Patient had blood cultures drawn and received antibiotics immediately after evaluation.  Patient remains hemodynamically stable in ed.  Patient to be admitted to med surg bed.    Hilario Quarry, MD 09/12/11 [phone]

## 2011-09-06 DIAGNOSIS — I429 Cardiomyopathy, unspecified: Secondary | ICD-10-CM

## 2011-09-06 DIAGNOSIS — L02419 Cutaneous abscess of limb, unspecified: Secondary | ICD-10-CM

## 2011-09-06 DIAGNOSIS — L03119 Cellulitis of unspecified part of limb: Secondary | ICD-10-CM

## 2011-09-06 DIAGNOSIS — I4891 Unspecified atrial fibrillation: Secondary | ICD-10-CM

## 2011-09-06 LAB — CBC
Hemoglobin: 12 g/dL — ABNORMAL LOW (ref 13.0–17.0)
MCH: 29.1 pg (ref 26.0–34.0)
MCHC: 33.1 g/dL (ref 30.0–36.0)
MCV: 87.7 fL (ref 78.0–100.0)
RBC: 4.13 MIL/uL — ABNORMAL LOW (ref 4.22–5.81)

## 2011-09-06 LAB — BASIC METABOLIC PANEL
BUN: 12 mg/dL (ref 6–23)
CO2: 24 mEq/L (ref 19–32)
GFR calc non Af Amer: >90 mL/min (ref >90)
Glucose, Bld: 109 mg/dL — ABNORMAL HIGH (ref 70–99)
Potassium: 3.9 mEq/L (ref 3.5–5.1)
Sodium: 141 mEq/L (ref 135–145)

## 2011-09-06 MED ORDER — PIPERACILLIN-TAZOBACTAM 3.375 G IVPB
3.3750 g | Freq: Three times a day (TID) | INTRAVENOUS | Status: DC
  Administered 2011-09-06 – 2011-09-07 (×4): 3.375 g via INTRAVENOUS
  Filled 2011-09-06 (×7): qty 50

## 2011-09-06 MED ORDER — ENOXAPARIN SODIUM 40 MG/0.4ML ~~LOC~~ SOLN
40.0000 mg | SUBCUTANEOUS | Status: DC
  Administered 2011-09-06: 40 mg via SUBCUTANEOUS
  Filled 2011-09-06: qty 0.4

## 2011-09-06 NOTE — Progress Notes (Signed)
Subjective: 66 y/o male without h/o peripheral neuropathy or diabetes stepped on a piece of broken glass at his home two days ago.  Presented to ED yesterday with cellulitis ascending from left medial foot to medial leg.  H/o cellulitis in this leg several years ago.  Pt is not a smoker.  No h/o diabetes or hypothyroidism.  Started on vanc.  Zosyn added later.  Pt says the leg is feeling better but still quite painful particularly at medial plantar arch.  He denies any drainage from teh foot.  Objective: Vital signs in last 24 hours: Temp:  [97.8 F (36.6 C)-98.8 F (37.1 C)] 98 F (36.7 C) (06/02 0514) Pulse Rate:  [66-94] 66  (06/02 0514) Resp:  [16-20] 20  (06/02 0514) BP: (126-138)/(74-84) 135/83 mmHg (06/02 0514) SpO2:  [95 %-99 %] 95 % (06/02 0514) Weight:  [96.4 kg (212 lb 8.4 oz)] 96.4 kg (212 lb 8.4 oz) (06/01 2218)  Intake/Output from previous day: 06/01 0701 - 06/02 0700 In: -  Out: 400 [Urine:400] Intake/Output this shift: Total I/O In: 360 [P.O.:360] Out: -    Basename 09/06/11 0540 09/05/11 1733  HGB 12.0* 13.3    Basename 09/06/11 0540 09/05/11 1733  WBC 7.6 11.0*  RBC 4.13* 4.56  HCT 36.2* 40.3  PLT 188 211    Basename 09/06/11 0540 09/05/11 1733  NA 141 136  K 3.9 4.0  CL 105 99  CO2 24 27  BUN 12 17  CREATININE 0.75 0.93  GLUCOSE 109* 90  CALCIUM 9.1 9.7   No results found for this basename: LABPT:2,INR:2 in the last 72 hours  PE:  WN WD male in nad.  A and O x 4.  Mood and affect normal.  EOMI.  Respirations unlabored.  L foot with swelling medially.  Slight erythema.  Small v shaped puncture wound at plantar arch.  TTP dorsally and medially from that point.  No fluctuance.  Feels LT in foot.  2+ dp and pt pulses.  5/5 strength in PF and DF.  Flatfoot deformity bilat.  Hallux valgus on L.  Assessment/Plan: Left foot cellulitis - resolving with IV abx.  Given the focal tenderness at the midfoot, I believe an MRI is required to eval for abscess.  In  the meantime, he can bear weight as tolerated and continue with IV vanc and zosyn.   Toni Arthurs 09/06/2011, 9:55 AM

## 2011-09-06 NOTE — Progress Notes (Signed)
[name] [name] is a pleasant 66 y.o. male admitted with cellulitis of left foot, after he stepped on broken glass in in his kitchen. Foot xray did not show retained foreign object but he remains quite tender on the plantar aspect of the left foot. He was started on vanc/zosyn at admission. He reports some improvement in erythema. He continues pradaxa for afib.   1. Cellulitis   2. Lymphangitis     Past Medical History  Diagnosis Date  . Paroxysmal atrial fibrillation     s/p afib 2001 and 2009 by Dr Delena Serve  . Coronary artery disease     no significant CAD by cath 5/12  . Cardiomyopathy     hx of tachycardia mediated CM, now resolved  . Hypertension   . Hyperlipidemia   . Thoracic aortic aneurysm   . Depression   . Sleep apnea     mild   Current Facility-Administered Medications  Medication Dose Route Frequency Provider Last Rate Last Dose  . 0.9 %  sodium chloride infusion   Intravenous STAT Drucie Opitz, PA 75 mL/hr at 09/05/11 2213    . 0.9 %  sodium chloride infusion  250 mL Intravenous PRN Houston Siren, MD      . acetaminophen (TYLENOL) tablet 650 mg  650 mg Oral Q6H PRN Houston Siren, MD       Or  . acetaminophen (TYLENOL) suppository 650 mg  650 mg Rectal Q6H PRN Houston Siren, MD      . atorvastatin (LIPITOR) tablet 40 mg  40 mg Oral QHS Houston Siren, MD   40 mg at 09/05/11 2357  . cefTRIAXone (ROCEPHIN) 1 g in dextrose 5 % 50 mL IVPB  1 g Intravenous Once Hilario Quarry, MD   1 g at 09/05/11 1744  . dabigatran (PRADAXA) capsule 150 mg  150 mg Oral Q12H Houston Siren, MD   150 mg at 09/05/11 2357  . flecainide (TAMBOCOR) tablet 100 mg  100 mg Oral BID Houston Siren, MD   100 mg at 09/05/11 2358  . HYDROcodone-acetaminophen (NORCO) 5-325 MG per tablet 1-2 tablet  1-2 tablet Oral Q4H PRN Houston Siren, MD      . metoprolol succinate (TOPROL-XL) 24 hr tablet 50 mg  50 mg Oral QHS Houston Siren, MD   50 mg at 09/05/11 2358  . ondansetron (ZOFRAN) injection 4 mg  4 mg Intravenous Q8H PRN Drucie Opitz, PA      .  ondansetron (ZOFRAN) tablet 4 mg  4 mg Oral Q6H PRN Houston Siren, MD       Or  . ondansetron [facility]) injection 4 mg  4 mg Intravenous Q6H PRN Houston Siren, MD      . piperacillin-tazobactam (ZOSYN) IVPB 3.375 g  3.375 g Intravenous Q8H Herby Abraham, PHARMD   3.375 g at 09/06/11 0908  . sodium chloride 0.9 % injection 3 mL  3 mL Intravenous Q12H Houston Siren, MD      . sodium chloride 0.9 % injection 3 mL  3 mL Intravenous Q12H Houston Siren, MD      . sodium chloride 0.9 % injection 3 mL  3 mL Intravenous PRN Houston Siren, MD      . tetanus & diphtheria toxoids (adult) [facility]) injection 0.5 mL  0.5 mL Intramuscular Once Houston Siren, MD   0.5 mL at 09/05/11 2358  . vancomycin (VANCOCIN) 1,250 mg in sodium chloride 0.9 % 250 mL IVPB  1,250 mg Intravenous Q12H Aleese Kamps, MD   1,250 mg  at 09/06/11 0646  . vancomycin (VANCOCIN) IVPB 1000 mg/200 mL premix  1,000 mg Intravenous Once Hilario Quarry, MD   1,000 mg at 09/05/11 1823  . Vitamin D (Ergocalciferol) (DRISDOL) capsule 50,000 Units  50,000 Units Oral Q7 days Houston Siren, MD      . zolpidem [facility]) tablet 10 mg  10 mg Oral QHS PRN Houston Siren, MD   10 mg at 09/05/11 2357  . DISCONTD: finasteride (PROPECIA) tablet 1 mg  1 mg Oral q morning - 10a Houston Siren, MD      . DISCONTD: saw palmetto capsule 160 mg  160 mg Oral BID Houston Siren, MD       Allergies  Allergen Reactions  . Tetracyclines & Related Hives   Principal Problem:  *Cellulitis Active Problems:  PAF (paroxysmal atrial fibrillation)  Sleep apnea  HTN (hypertension)  Cardiomyopathy   Vital signs in last 24 hours: Temp:  [97.8 F (36.6 C)-98.8 F (37.1 C)] 98 F (36.7 C) (06/02 0514) Pulse Rate:  [66-94] 66  (06/02 0514) Resp:  [16-20] 20  (06/02 0514) BP: (126-138)/(74-84) 135/83 mmHg (06/02 0514) SpO2:  [95 %-99 %] 95 % (06/02 0514) Weight:  [96.4 kg (212 lb 8.4 oz)] 96.4 kg (212 lb 8.4 oz) (06/01 2218) Weight change:  Last BM Date: 09/05/11  Intake/Output from previous day: 06/01 0701 -  06/02 0700 In: -  Out: 400 [Urine:400] Intake/Output this shift: Total I/O In: 360 [P.O.:360] Out: -   Lab Results:  Basename 09/06/11 0540 09/05/11 1733  WBC 7.6 11.0*  HGB 12.0* 13.3  HCT 36.2* 40.3  PLT 188 211   BMET  Basename 09/06/11 0540 09/05/11 1733  NA 141 136  K 3.9 4.0  CL 105 99  CO2 24 27  GLUCOSE 109* 90  BUN 12 17  CREATININE 0.75 0.93  CALCIUM 9.1 9.7    Studies/Results: Dg Foot Complete Left  09/05/2011  *RADIOLOGY REPORT*  Clinical Data: 66 year old male with pain and redness.  Swelling. Puncture wound 2 days ago.  LEFT FOOT - COMPLETE 3+ VIEW  Comparison: None.  Findings: No radiopaque foreign body identified.  No subcutaneous gas.  Hallux valgus with metatarsus primus varus.  Joint spaces preserved.  No fracture or dislocation.  IMPRESSION: No retained radiopaque foreign body or subcutaneous gas.  No fracture or dislocation identified.  Original Report Authenticated By: Harley Hallmark, M.D.    Medications: I have reviewed the patient's current medications.   Physical exam GENERAL- alert HEAD- normal atraumatic, no neck masses, normal thyroid, no jvd RESPIRATORY- appears well, vitals normal, no respiratory distress, acyanotic, normal RR, ear and throat exam is normal, neck free of mass or lymphadenopathy, chest clear, no wheezing, crepitations, rhonchi, normal symmetric air entry CVS- regular rate and rhythm, S1, S2 normal, no murmur, click, rub or gallop ABDOMEN- abdomen is soft without significant tenderness, masses, organomegaly or guarding NEURO- Grossly normal EXTREMITIES- swollen left foot, especially plantar aspect. Point tenderness and mid plantar. No drainage.  Plan   * Cellulitis of the foot- worry for fasciitis/bone involvement. Will ask Orthopedics to see. Continue vanc/zosyn. Check sed rate.  * PAF (paroxysmal atrial fibrillation)- rate controlled. On Pradaxa. Will need to hold pradaxa if needs surgery.  * Sleep apnea/ HTN  (hypertension)/ Cardiomyopathy- stable.    Sharmeka Palmisano 09/06/2011 9:18 AM Pager: [phone].

## 2011-09-06 NOTE — Consult Note (Signed)
Reason for Consult:left foot pain  Referring Physician:Dr. Osmani [name] is an 66 y.o. male.  HPI: [name] [name] is a 66 year old patient who stepped on a glass spike from a lead crystal salsa or 3 days ago. The glass punctured his sock but did not go through a rubberized sole. The patient states he had mild bleeding but was otherwise feeling well until 2 days ago when the foot began to hurt and swell. He now reports exquisite pain on the plantar aspect of his left foot with and inability to ambulate and put much weight on the foot. Patient is not diabetic he is currently admitted on the cardiac service because of history of atrial fibrillation. The patient is on pradaxia as a blood thinner.   Past Medical History  Diagnosis Date  . Paroxysmal atrial fibrillation     s/p afib 2001 and 2009 by Dr [name] [name]  . Coronary artery disease     no significant CAD by cath 5/12  . Cardiomyopathy     hx of tachycardia mediated CM, now resolved  . Hypertension   . Hyperlipidemia   . Thoracic aortic aneurysm   . Depression   . Sleep apnea     mild    Past Surgical History  Procedure Date  . Cardiac catheterization 08/25/10    NO SIGNIFICANT CAD  . Cardioversion   . Elbow / upper arm foreign body removal     Family History  Problem Relation Age of Onset  . Coronary artery disease Father   . Stroke Mother     died  . Atrial fibrillation Brother     Social History:  reports that he has quit smoking. He does not have any smokeless tobacco history on file. He reports that he drinks alcohol. He reports that he does not use illicit drugs.  Allergies:  Allergies  Allergen Reactions  . Tetracyclines & Related Hives    Medications: I have reviewed the patient's current medications.  Results for orders placed during the hospital encounter of 09/05/11 (from the past 48 hour(s))  CBC     Status: Abnormal   Collection Time   09/05/11  5:33 PM      Component Value Range Comment   WBC  11.0 (*) 4.0 - 10.5 (K/uL)    RBC 4.56  4.22 - 5.81 (MIL/uL)    Hemoglobin 13.3  13.0 - 17.0 (g/dL)    HCT 11.9  14.7 - 82.9 (%)    MCV 88.4  78.0 - 100.0 (fL)    MCH 29.2  26.0 - 34.0 (pg)    MCHC 33.0  30.0 - 36.0 (g/dL)    RDW 56.2  13.0 - 86.5 (%)    Platelets 211  150 - 400 (K/uL)   DIFFERENTIAL     Status: Abnormal   Collection Time   09/05/11  5:33 PM      Component Value Range Comment   Neutrophils Relative 70  43 - 77 (%)    Neutro Abs 7.7  1.7 - 7.7 (K/uL)    Lymphocytes Relative 17  12 - 46 (%)    Lymphs Abs 1.8  0.7 - 4.0 (K/uL)    Monocytes Relative 13 (*) 3 - 12 (%)    Monocytes Absolute 1.4 (*) 0.1 - 1.0 (K/uL)    Eosinophils Relative 1  0 - 5 (%)    Eosinophils Absolute 0.1  0.0 - 0.7 (K/uL)    Basophils Relative 0  0 - 1 (%)  Basophils Absolute 0.0  0.0 - 0.1 (K/uL)   BASIC METABOLIC PANEL     Status: Abnormal   Collection Time   09/05/11  5:33 PM      Component Value Range Comment   Sodium 136  135 - 145 (mEq/L)    Potassium 4.0  3.5 - 5.1 (mEq/L)    Chloride 99  96 - 112 (mEq/L)    CO2 27  19 - 32 (mEq/L)    Glucose, Bld 90  70 - 99 (mg/dL)    BUN 17  6 - 23 (mg/dL)    Creatinine, Ser 4.78  0.50 - 1.35 (mg/dL)    Calcium 9.7  8.4 - 10.5 (mg/dL)    GFR calc non Af Amer 87 (*) >90 (mL/min)    GFR calc Af Amer >90  >90 (mL/min)   BASIC METABOLIC PANEL     Status: Abnormal   Collection Time   09/06/11  5:40 AM      Component Value Range Comment   Sodium 141  135 - 145 (mEq/L)    Potassium 3.9  3.5 - 5.1 (mEq/L)    Chloride 105  96 - 112 (mEq/L)    CO2 24  19 - 32 (mEq/L)    Glucose, Bld 109 (*) 70 - 99 (mg/dL)    BUN 12  6 - 23 (mg/dL)    Creatinine, Ser 2.95  0.50 - 1.35 (mg/dL)    Calcium 9.1  8.4 - 10.5 (mg/dL)    GFR calc non Af Amer >90  >90 (mL/min)    GFR calc Af Amer >90  >90 (mL/min)   CBC     Status: Abnormal   Collection Time   09/06/11  5:40 AM      Component Value Range Comment   WBC 7.6  4.0 - 10.5 (K/uL)    RBC 4.13 (*) 4.22 - 5.81  (MIL/uL)    Hemoglobin 12.0 (*) 13.0 - 17.0 (g/dL)    HCT 62.1 (*) 30.8 - 52.0 (%)    MCV 87.7  78.0 - 100.0 (fL)    MCH 29.1  26.0 - 34.0 (pg)    MCHC 33.1  30.0 - 36.0 (g/dL)    RDW 65.7  84.6 - 96.2 (%)    Platelets 188  150 - 400 (K/uL)     Dg Foot Complete Left  09/05/2011  *RADIOLOGY REPORT*  Clinical Data: 66 year old male with pain and redness.  Swelling. Puncture wound 2 days ago.  LEFT FOOT - COMPLETE 3+ VIEW  Comparison: None.  Findings: No radiopaque foreign body identified.  No subcutaneous gas.  Hallux valgus with metatarsus primus varus.  Joint spaces preserved.  No fracture or dislocation.  IMPRESSION: No retained radiopaque foreign body or subcutaneous gas.  No fracture or dislocation identified.  Original Report Authenticated By: [name] [name], M.D.    Review of Systems  Constitutional: Negative.   HENT: Negative.   Eyes: Negative.   Respiratory: Negative.   Cardiovascular: Negative.   Gastrointestinal: Negative.   Musculoskeletal: Positive for joint pain.  Skin: Negative.   Neurological: Negative.   Endo/Heme/Allergies: Negative.   Psychiatric/Behavioral: Negative.    Blood pressure 142/88, pulse 67, temperature 98.2 F (36.8 C), temperature source Oral, resp. rate 16, height 6\' 3"  (1.905 m), weight 96.4 kg (212 lb 8.4 oz), SpO2 98.00%. Physical Exam  Constitutional: He appears well-developed.  HENT:  Head: Normocephalic.  Eyes: Pupils are equal, round, and reactive to light.  Neck: Normal range of motion.  Cardiovascular: Normal  rate.   Respiratory: Effort normal.  Neurological: He is alert.  Skin: Skin is warm.  Psychiatric: He has a normal mood and affect.   on examination of the left foot the patient does have a puncture wound measuring 8 mm in an arc on the plantar midportion of his foot. There is surrounding cellulitis and erythema proximally 8 cm around this area. Compartments are soft. Erythema does not extend above the bimalleolar axis. There is no  dorsal swelling or fluctuance. Ankle and toe range of motion is intact. Sensation is intact on the dorsum plantar aspect of the left foot. Compartments otherwise soft. There is no crepitus to the tissue.  Assessment/Plan: Impression is left foot cellulitis with likely abscess within the deep tissues of the foot. The shard of glass measure approximately 2 inches. On radiographs there is no retained foreign body. White count is elevated to 11,000. For joint the patient is not diabetic. Plan is to hold the patient's ataxia and obtain an MRI scan tomorrow with likely surgery to follow. He'll need to be n.p.o. after midnight tonight. All questions answered. Agree with gram-negative coverage and broad-spectrum antibiotics for now until an organism can be identified if there is an abscess cavity in the foot.  Omie Ferger SCOTT 09/06/2011, 7:35 PM

## 2011-09-06 NOTE — Progress Notes (Signed)
ANTIBIOTIC CONSULT NOTE - FOLLOW UP  Pharmacy Consult for zosyn Indication: left foot cellulitis  Allergies  Allergen Reactions  . Tetracyclines & Related Hives    Patient Measurements: Height: 6\' 3"  (190.5 cm) Weight: 212 lb 8.4 oz (96.4 kg) IBW/kg (Calculated) : 84.5    Vital Signs: Temp: 98 F (36.7 C) (06/02 0514) Temp src: Oral (06/02 0514) BP: 135/83 mmHg (06/02 0514) Pulse Rate: 66  (06/02 0514) Intake/Output from previous day: 06/01 0701 - 06/02 0700 In: -  Out: 400 [Urine:400] Intake/Output from this shift:    Labs:  Basename 09/06/11 0540 09/05/11 1733  WBC 7.6 11.0*  HGB 12.0* 13.3  PLT 188 211  LABCREA -- --  CREATININE 0.75 0.93   Estimated Creatinine Clearance: 111.5 ml/min (by C-G formula based on Cr of 0.75). No results found for this basename: VANCOTROUGH:2,VANCOPEAK:2,VANCORANDOM:2,GENTTROUGH:2,GENTPEAK:2,GENTRANDOM:2,TOBRATROUGH:2,TOBRAPEAK:2,TOBRARND:2,AMIKACINPEAK:2,AMIKACINTROU:2,AMIKACIN:2, in the last 72 hours   Microbiology: No results found for this or any previous visit (from the past 720 hour(s)).  Anti-infectives     Start     Dose/Rate Route Frequency Ordered Stop   09/06/11 0800  piperacillin-tazobactam (ZOSYN) IVPB 3.375 g       3.375 g 12.5 mL/hr over 240 Minutes Intravenous 3 times per day 09/06/11 0746     09/06/11 0500   vancomycin (VANCOCIN) 1,250 mg in sodium chloride 0.9 % 250 mL IVPB        1,250 mg 166.7 mL/hr over 90 Minutes Intravenous Every 12 hours 09/05/11 2227     09/05/11 1715   cefTRIAXone (ROCEPHIN) 1 g in dextrose 5 % 50 mL IVPB        1 g 100 mL/hr over 30 Minutes Intravenous  Once 09/05/11 1705 09/05/11 1814   09/05/11 1715   vancomycin (VANCOCIN) IVPB 1000 mg/200 mL premix        1,000 mg 200 mL/hr over 60 Minutes Intravenous  Once 09/05/11 1705 09/05/11 1923          Assessment: asked by Dr. Shelle Iron to add Zosyn to vancomycin to cover GNRs and pseudomonas in pt with left foot cellulits  Plan:    Zosyn 3.375 grams IV q8hrs each over 4 hours vanc as ordered Herby Abraham, Pharm.D. [phone] 09/06/2011 7:54 AM

## 2011-09-07 ENCOUNTER — Ambulatory Visit: Payer: BC Managed Care – PPO | Admitting: Internal Medicine

## 2011-09-07 ENCOUNTER — Encounter (HOSPITAL_COMMUNITY)
Admission: EM | Disposition: A | Discharge status: Home / Self Care | Payer: Self-pay | Source: Home / Self Care | Attending: Internal Medicine

## 2011-09-07 ENCOUNTER — Inpatient Hospital Stay (HOSPITAL_COMMUNITY): Payer: Medicare Other

## 2011-09-07 DIAGNOSIS — I429 Cardiomyopathy, unspecified: Secondary | ICD-10-CM

## 2011-09-07 DIAGNOSIS — L03119 Cellulitis of unspecified part of limb: Secondary | ICD-10-CM

## 2011-09-07 DIAGNOSIS — I4891 Unspecified atrial fibrillation: Secondary | ICD-10-CM

## 2011-09-07 DIAGNOSIS — L02419 Cutaneous abscess of limb, unspecified: Secondary | ICD-10-CM

## 2011-09-07 LAB — COMPREHENSIVE METABOLIC PANEL
Alkaline Phosphatase: 50 U/L (ref 39–117)
BUN: 12 mg/dL (ref 6–23)
CO2: 26 mEq/L (ref 19–32)
Chloride: 102 mEq/L (ref 96–112)
Creatinine, Ser: 0.89 mg/dL (ref 0.50–1.35)
GFR calc Af Amer: >90 mL/min (ref >90)
GFR calc non Af Amer: 88 mL/min — ABNORMAL LOW (ref >90)
Glucose, Bld: 102 mg/dL — ABNORMAL HIGH (ref 70–99)
Potassium: 4.4 mEq/L (ref 3.5–5.1)
Total Bilirubin: 0.6 mg/dL (ref 0.3–1.2)

## 2011-09-07 LAB — CBC
HCT: 36.3 % — ABNORMAL LOW (ref 39.0–52.0)
Hemoglobin: 11.9 g/dL — ABNORMAL LOW (ref 13.0–17.0)
MCV: 88.3 fL (ref 78.0–100.0)
WBC: 7.1 10*3/uL (ref 4.0–10.5)

## 2011-09-07 LAB — SEDIMENTATION RATE: Sed Rate: 24 mm/hr — ABNORMAL HIGH (ref 0–16)

## 2011-09-07 IMAGING — CR DG CHEST 1V PORT
1 series · 1 of 1 positions shown · non-contrast
Comparison: 09/20/2007

CLINICAL DATA: Arrhythmia, hypertension

PORTABLE CHEST - 1 VIEW

[view not recorded]
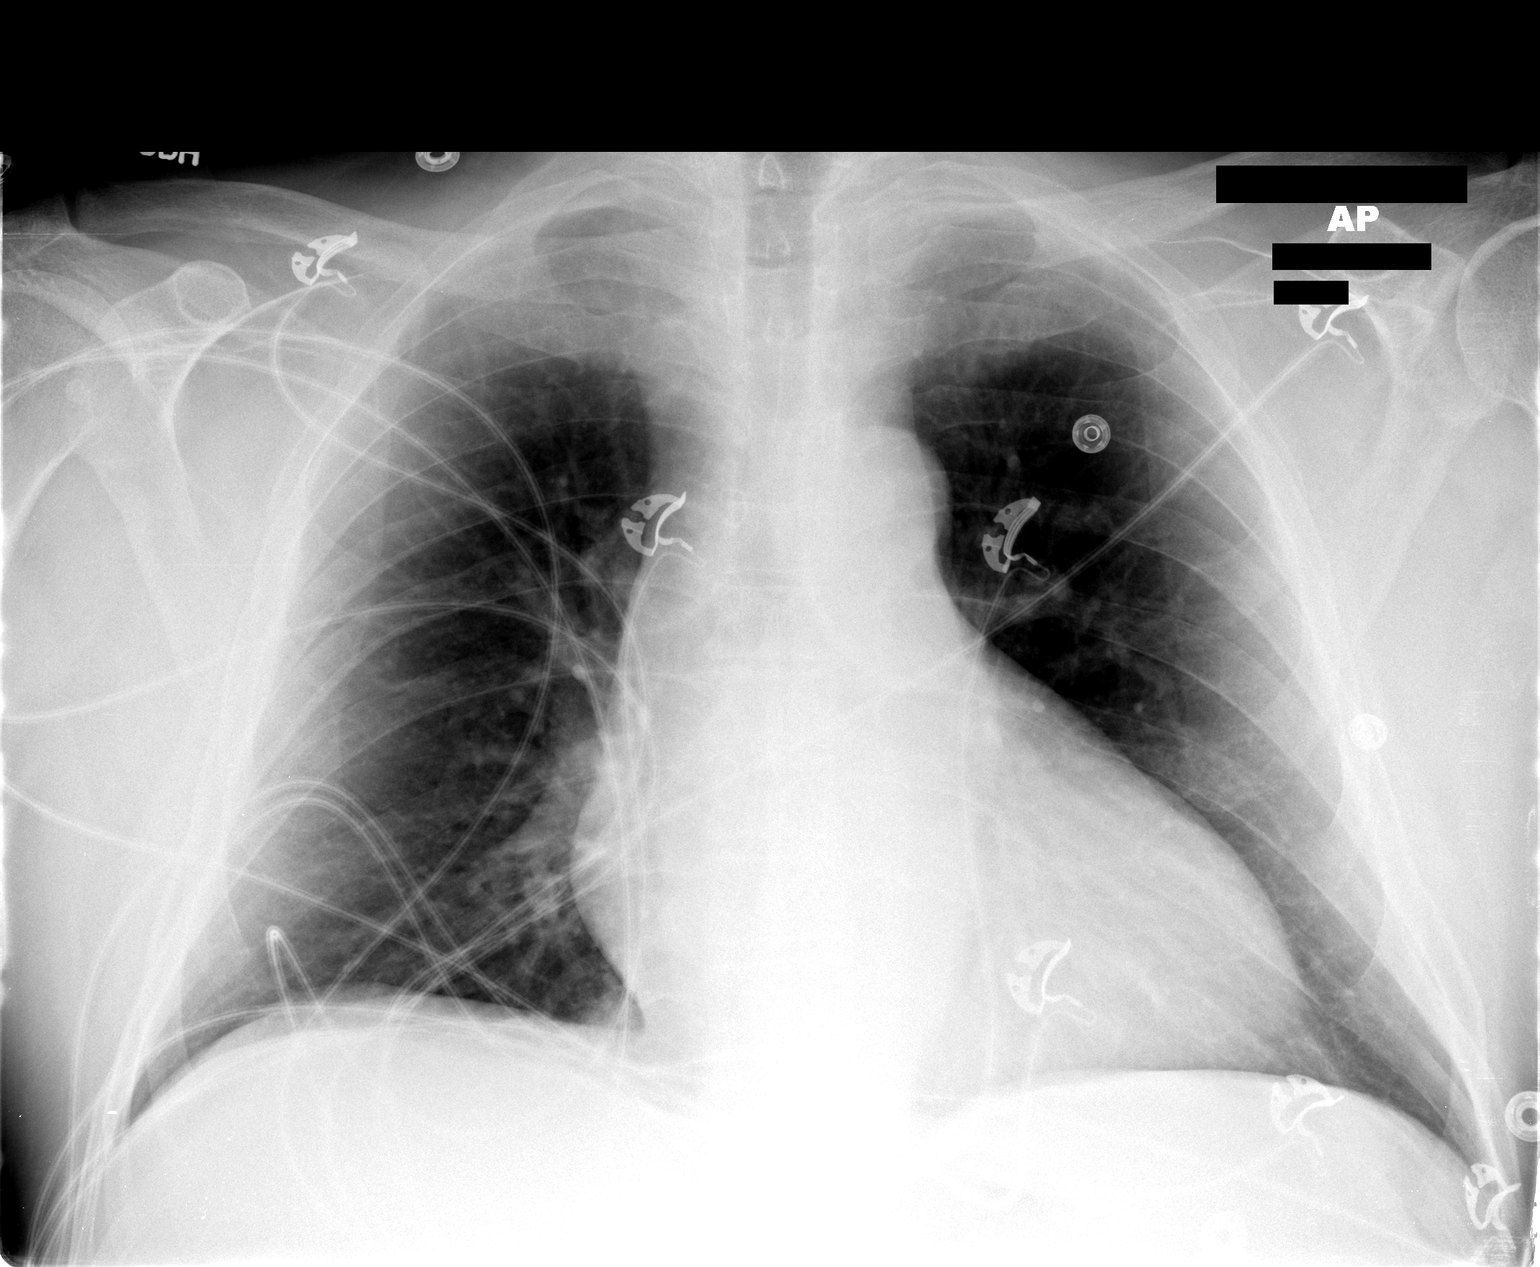

[1 of 1 positions shown; findings below may reference images not displayed]

FINDINGS: Cardiac enlargement without heart failure.  Negative for
pneumonia or effusion
IMPRESSION: No acute cardiopulmonary disease.

## 2011-09-07 SURGERY — IRRIGATION AND DEBRIDEMENT WOUND
Anesthesia: General | Laterality: Left | Wound class: Clean Contaminated

## 2011-09-07 MED ORDER — GADOBENATE DIMEGLUMINE 529 MG/ML IV SOLN
20.0000 mL | Freq: Once | INTRAVENOUS | Status: AC
  Administered 2011-09-07: 20 mL via INTRAVENOUS

## 2011-09-07 MED ORDER — AMOXICILLIN-POT CLAVULANATE 875-125 MG PO TABS
1.0000 | ORAL_TABLET | Freq: Two times a day (BID) | ORAL | Status: DC
  Administered 2011-09-07 – 2011-09-08 (×2): 1 via ORAL
  Filled 2011-09-07 (×4): qty 1

## 2011-09-07 MED ORDER — DABIGATRAN ETEXILATE MESYLATE 150 MG PO CAPS
150.0000 mg | ORAL_CAPSULE | Freq: Two times a day (BID) | ORAL | Status: DC
  Administered 2011-09-07 – 2011-09-08 (×3): 150 mg via ORAL
  Filled 2011-09-07 (×4): qty 1

## 2011-09-07 NOTE — Care Management Note (Unsigned)
    Page 1 of 1   09/07/2011     8:59:33 AM   CARE MANAGEMENT NOTE 09/07/2011  Patient:  [facility]   Account Number:  [id number]  Date Initiated:  09/07/2011  Documentation initiated by:  SIMMONS,Matalyn Nawaz  Subjective/Objective Assessment:   ADMITTED WITH LEFT FOOT CELLULITIS; LIVES AT HOME WITH WIFE; IPTA.     Action/Plan:   DISCHARGE PLANNING INITIATED.   Anticipated DC Date:  09/09/2011   Anticipated DC Plan:  HOME/SELF CARE      DC Planning Services  CM consult      Choice offered to / List presented to:             Status of service:  In process, will continue to follow Medicare Important Message given?   (If response is "NO", the following Medicare IM given date fields will be blank) Date Medicare IM given:   Date Additional Medicare IM given:    Discharge Disposition:    Per UR Regulation:  Reviewed for med. necessity/level of care/duration of stay  If discussed at Long Length of Stay Meetings, dates discussed:    Comments:  09/07/11 0858   Jolyne Laye SIMMONS RN, BSN [phone] NCM WILL FOLLOW.

## 2011-09-07 NOTE — Progress Notes (Signed)
Interim history BYRON PEACOCK is a pleasant 66 y.o. male admitted with cellulitis of left foot, after he stepped on broken glass in in his kitchen. Foot xray did not show retained foreign object but he remains quite tender on the plantar aspect of the left foot. He was started on vanc/zosyn at admission. He reports some improvement in erythema.  Subjective Patient is status post MRI today, he states decreased erythema in left foot, still pain with but better   Past Medical History  Diagnosis Date  . Paroxysmal atrial fibrillation     s/p afib 2001 and 2009 by Dr Delena Serve  . Coronary artery disease     no significant CAD by cath 5/12  . Cardiomyopathy     hx of tachycardia mediated CM, now resolved  . Hypertension   . Hyperlipidemia   . Thoracic aortic aneurysm   . Depression   . Sleep apnea     mild   Current Facility-Administered Medications  Medication Dose Route Frequency Provider Last Rate Last Dose  . 0.9 %  sodium chloride infusion  250 mL Intravenous PRN Houston Siren, MD      . acetaminophen (TYLENOL) tablet 650 mg  650 mg Oral Q6H PRN Houston Siren, MD   650 mg at 09/06/11 1943   Or  . acetaminophen (TYLENOL) suppository 650 mg  650 mg Rectal Q6H PRN Houston Siren, MD      . atorvastatin (LIPITOR) tablet 40 mg  40 mg Oral QHS Houston Siren, MD   40 mg at 09/06/11 2149  . flecainide (TAMBOCOR) tablet 100 mg  100 mg Oral BID Houston Siren, MD   100 mg at 09/07/11 1014  . gadobenate dimeglumine (MULTIHANCE) injection 20 mL  20 mL Intravenous Once Medication Radiologist, MD   20 mL at 09/07/11 0917  . HYDROcodone-acetaminophen (NORCO) 5-325 MG per tablet 1-2 tablet  1-2 tablet Oral Q4H PRN Houston Siren, MD   2 tablet at 09/07/11 0903  . metoprolol succinate (TOPROL-XL) 24 hr tablet 50 mg  50 mg Oral QHS Houston Siren, MD   50 mg at 09/06/11 2149  . ondansetron (ZOFRAN) tablet 4 mg  4 mg Oral Q6H PRN Houston Siren, MD       Or  . ondansetron [facility]) injection 4 mg  4 mg Intravenous Q6H PRN Houston Siren, MD      .  piperacillin-tazobactam (ZOSYN) IVPB 3.375 g  3.375 g Intravenous Q8H Herby Abraham, PHARMD   3.375 g at 09/07/11 0905  . sodium chloride 0.9 % injection 3 mL  3 mL Intravenous Q12H Houston Siren, MD      . sodium chloride 0.9 % injection 3 mL  3 mL Intravenous Q12H Houston Siren, MD   3 mL at 09/07/11 1014  . sodium chloride 0.9 % injection 3 mL  3 mL Intravenous PRN Houston Siren, MD      . vancomycin (VANCOCIN) 1,250 mg in sodium chloride 0.9 % 250 mL IVPB  1,250 mg Intravenous Q12H Simbiso Ranga, MD   1,250 mg at 09/07/11 0528  . Vitamin D (Ergocalciferol) (DRISDOL) capsule 50,000 Units  50,000 Units Oral Q7 days Houston Siren, MD   50,000 Units at 09/06/11 0937  . zolpidem (AMBIEN) tablet 10 mg  10 mg Oral QHS PRN Houston Siren, MD   10 mg at 09/06/11 2150  . DISCONTD: enoxaparin (LOVENOX) injection 40 mg  40 mg Subcutaneous Q24H Simbiso Ranga, MD   40 mg at 09/06/11 1738   Allergies  Allergen Reactions  .  Tetracyclines & Related Hives   Principal Problem:  *Cellulitis Active Problems:  PAF (paroxysmal atrial fibrillation)  Sleep apnea  HTN (hypertension)  Cardiomyopathy   Vital signs in last 24 hours: Temp:  [97.4 F (36.3 C)-98.2 F (36.8 C)] 97.4 F (36.3 C) (06/03 0521) Pulse Rate:  [62-71] 62  (06/03 0521) Resp:  [16-18] 18  (06/03 0521) BP: (122-142)/(78-88) 134/88 mmHg (06/03 0521) SpO2:  [95 %-98 %] 95 % (06/03 0521) Weight change:  Last BM Date: 09/06/11  Intake/Output from previous day: 06/02 0701 - 06/03 0700 In: 940 [P.O.:840; IV Piggyback:100] Out: 650 [Urine:650] Intake/Output this shift:    Lab Results:  Basename 09/07/11 0528 09/06/11 0540  WBC 7.1 7.6  HGB 11.9* 12.0*  HCT 36.3* 36.2*  PLT 203 188   BMET  Basename 09/07/11 0528 09/06/11 0540  NA 139 141  K 4.4 3.9  CL 102 105  CO2 26 24  GLUCOSE 102* 109*  BUN 12 12  CREATININE 0.89 0.75  CALCIUM 9.1 9.1    Studies/Results: Mr Foot Left W Wo Contrast  09/07/2011  *RADIOLOGY REPORT*  Clinical Data: Stepped  on glass 5 days ago.  Foot pain and swelling.  MRI OF THE LEFT FOREFOOT WITHOUT AND WITH CONTRAST  Technique:  Multiplanar, multisequence MR imaging was performed both before and after administration of intravenous contrast.  Contrast: 20mL MULTIHANCE GADOBENATE DIMEGLUMINE 529 MG/ML IV SOLN  Comparison: Radiographs 09/05/2011.  Findings: Study includes most of the foot with the exception of the distal toes and posterior aspect of the calcaneus.  There is subcutaneous edema throughout the foot, especially dorsally in the forefoot.  No focal fluid collections are identified.  There is low- level enhancement of the plantar interosseous muscles within the fourth webspace. There is also mild enhancement surrounding the plantar fascia within the midfoot.  No foreign bodies are identified.  Intraosseous cyst formation within the fibular tip and first metatarsal head appears longstanding.  There is degenerative signal within the tibial sesamoid of the first metatarsal.  There is no evidence of acute fracture, dislocation or osteomyelitis. Incidental vascular remnant is seen anteriorly within the calcaneus.  IMPRESSION:  1.  No evidence of left foot osteomyelitis. 2.  Nonspecific subcutaneous edema and muscular enhancement within the plantar interosseous muscles laterally.  No focal abscess or foreign body identified. 3.  Scattered mild arthropathic changes.  Original Report Authenticated By: Gerrianne Scale, M.D.   Dg Foot Complete Left  09/05/2011  *RADIOLOGY REPORT*  Clinical Data: 66 year old male with pain and redness.  Swelling. Puncture wound 2 days ago.  LEFT FOOT - COMPLETE 3+ VIEW  Comparison: None.  Findings: No radiopaque foreign body identified.  No subcutaneous gas.  Hallux valgus with metatarsus primus varus.  Joint spaces preserved.  No fracture or dislocation.  IMPRESSION: No retained radiopaque foreign body or subcutaneous gas.  No fracture or dislocation identified.  Original Report Authenticated By:  Harley Hallmark, M.D.    Medications: I have reviewed the patient's current medications.   Physical exam GENERAL- alert HEAD- normal atraumatic, no neck masses, normal thyroid, no jvd RESPIRATORY- clear to auscultation bilaterally. CVS- regular rate and rhythm, S1, S2 normal, no murmur, click, rub or gallop ABDOMEN- abdomen is soft without significant tenderness, masses, organomegaly or guarding NEURO- Grossly normal EXTREMITIES- swollen left foot, especially plantar aspect, decreased erythema. Tenderness greater in the area of the plantar arch.  No drainage.  Plan   * Cellulitis of the foot -MRI today negative for osteomyelitis also  negative for abscess -Appreciate Dr. Laverta Baltimore input, will change to oral antibiotics and follow - I have discussed patient with ID/Dr. Ninetta Lights- monotherapy with Augmentin versus double coverage and he states treating with Augmentin should be adequate.  * PAF (paroxysmal atrial fibrillation)- rate controlled.  -I have resumed his his pradaxa  * Sleep apnea/ HTN (hypertension)/ Cardiomyopathy- stable.  disposition-If patient remains afebrile and cellulitis continues to improve on oral antibiotics we'll plan to DC in a.m.  Kela Millin 09/07/2011 1:36 PM Pager: [phone].

## 2011-09-07 NOTE — Clinical Social Work Psychosocial (Signed)
     Clinical Social Work Department BRIEF PSYCHOSOCIAL ASSESSMENT 09/07/2011  Patient:  [name] [name], [name] [name]     Account Number:  [id number]     Admit date:  09/05/2011  Clinical Social Worker:  Robin Searing  Date/Time:  09/07/2011 11:32 AM  Referred by:  RN  Date Referred:  09/07/2011 Referred for  Psychosocial assessment   Other Referral:   Interview type:  Patient Other interview type:    PSYCHOSOCIAL DATA Living Status:  FRIEND(S) Admitted from facility:   Level of care:   Primary support name:   Primary support relationship to patient:   Degree of support available:   good    CURRENT CONCERNS Current Concerns  Adjustment to Illness   Other Concerns:   Patient anxiously awaiting MRI results to determine if he will need surgery- Provided support to him and he does appear less anxious after our visit.    SOCIAL WORK ASSESSMENT / PLAN CSW will follow for support- patient voiced [name] lot of frustration related to his stay thus far- beginning in the ED on Saturday- "never should come to [name] hospital on [name] weekend".  Offered supportive listening to patient and will follow up with him to discuss this further-   Assessment/plan status:  Other - See comment Other assessment/ plan:   Information/referral to community resources:   TBD    PATIENTS/FAMILYS RESPONSE TO PLAN OF CARE: Patient appreciative and receptive to CSW visit and support.

## 2011-09-07 NOTE — Progress Notes (Signed)
Subjective: Pt says his foot is feeling better.  Less sore.  No f/c/n/v.  Objective: Vital signs in last 24 hours: Temp:  [97.4 F (36.3 C)-98.2 F (36.8 C)] 97.4 F (36.3 C) (06/03 0521) Pulse Rate:  [62-71] 62  (06/03 0521) Resp:  [16-18] 18  (06/03 0521) BP: (122-142)/(78-88) 134/88 mmHg (06/03 0521) SpO2:  [95 %-98 %] 95 % (06/03 0521)  Intake/Output from previous day: 06/02 0701 - 06/03 0700 In: 940 [P.O.:840; IV Piggyback:100] Out: 650 [Urine:650] Intake/Output this shift:     Basename 09/07/11 0528 09/06/11 0540 09/05/11 1733  HGB 11.9* 12.0* 13.3    Basename 09/07/11 0528 09/06/11 0540  WBC 7.1 7.6  RBC 4.11* 4.13*  HCT 36.3* 36.2*  PLT 203 188    Basename 09/07/11 0528 09/06/11 0540  NA 139 141  K 4.4 3.9  CL 102 105  CO2 26 24  BUN 12 12  CREATININE 0.89 0.75  GLUCOSE 102* 109*  CALCIUM 9.1 9.1   No results found for this basename: LABPT:2,INR:2 in the last 72 hours  MRI: no abscess or foreign body.  Assessment/Plan: L foot cellulitis - since there is no foreign body or sign of abscess on the MRI and the pt has responded well to IV abx, there is no current indication for surgical treatment.  I'd recommend changing him to oral abx and restarting pradaxa.  Surgery cancelled.  I spoke with Dr. August Saucer who agreed that I would continue to consult on the patient.   Toni Arthurs 09/07/2011, 12:37 PM

## 2011-09-08 ENCOUNTER — Encounter (HOSPITAL_COMMUNITY): Payer: Self-pay | Admitting: Orthopedic Surgery

## 2011-09-08 DIAGNOSIS — I429 Cardiomyopathy, unspecified: Secondary | ICD-10-CM

## 2011-09-08 DIAGNOSIS — I4891 Unspecified atrial fibrillation: Secondary | ICD-10-CM

## 2011-09-08 DIAGNOSIS — L02419 Cutaneous abscess of limb, unspecified: Secondary | ICD-10-CM

## 2011-09-08 DIAGNOSIS — L03119 Cellulitis of unspecified part of limb: Secondary | ICD-10-CM

## 2011-09-08 MED ORDER — AMOXICILLIN-POT CLAVULANATE 875-125 MG PO TABS: 1.0000 | ORAL_TABLET | Freq: Two times a day (BID) | ORAL | Status: AC

## 2011-09-08 MED ORDER — HYDROCODONE-ACETAMINOPHEN 5-325 MG PO TABS: 1.0000 | ORAL_TABLET | ORAL | Status: AC | PRN

## 2011-09-08 NOTE — Discharge Instructions (Signed)
Bear weight as tolerated on the foot.  Continue antibiotics as prescribed.  Call [phone] if you have any questions and to schedule your follow up appointment for next week.  Toni Arthurs, MD [facility]

## 2011-09-08 NOTE — Progress Notes (Signed)
DC instructions given to pt at this time Re:  F/u appts, home activity, diet, s/s of problems, and home meds. Pt verbalized understanding of all instructions.   DCd IV and Telemetry.  No s/s of any acute distress.  No c/o pain.

## 2011-09-08 NOTE — Discharge Summary (Signed)
Discharge Note  Name: [name] [name] MRN: [id number] DOB: 1945/12/18 66 y.o.  Date of Admission: 09/05/2011  4:39 PM Date of Discharge: 09/08/2011 Attending Physician: Conley Canal, MD  Discharge Diagnosis: Principal Problem:  *Cellulitis Active Problems:  PAF (paroxysmal atrial fibrillation)  Sleep apnea  HTN (hypertension)  Cardiomyopathy   Discharge Medications: Medication List  As of 09/08/2011  9:14 AM   TAKE these medications         amoxicillin-clavulanate 875-125 MG per tablet   Commonly known as: AUGMENTIN   Take 1 tablet by mouth 2 (two) times daily with a meal.      atorvastatin 40 MG tablet   Commonly known as: LIPITOR   Take 40 mg by mouth at bedtime.      finasteride 1 MG tablet   Commonly known as: PROPECIA   Take 1 mg by mouth every morning.      flecainide 100 MG tablet   Commonly known as: TAMBOCOR   Take 100 mg by mouth 2 (two) times daily.      HYDROcodone-acetaminophen 5-325 MG per tablet   Commonly known as: NORCO   Take 1-2 tablets by mouth every 4 (four) hours as needed.      metoprolol succinate 50 MG 24 hr tablet   Commonly known as: TOPROL-XL   Take 50 mg by mouth at bedtime. Take with or immediately following a meal.      PRADAXA 150 MG Caps   Generic drug: dabigatran   Take 150 mg by mouth every 12 (twelve) hours.      saw palmetto 160 MG capsule   Take 160 mg by mouth 2 (two) times daily.      Vitamin D (Ergocalciferol) 50000 UNITS Caps   Commonly known as: DRISDOL   Take 50,000 Units by mouth every 7 (seven) days. Takes on Sunday            Disposition and follow-up:   [name] was discharged from [facility] in improved/stable condition.    Follow-up Appointments: Discharge Orders    Future Orders Please Complete By Expires   Diet - low sodium heart healthy      Increase activity slowly         Consultations: Treatment Team:  Toni Arthurs, MD  Procedures Performed:  Mr Foot Left W Wo  Contrast  09/07/2011  *RADIOLOGY REPORT*  Clinical Data: Stepped on glass 5 days ago.  Foot pain and swelling.  MRI OF THE LEFT FOREFOOT WITHOUT AND WITH CONTRAST  Technique:  Multiplanar, multisequence MR imaging was performed both before and after administration of intravenous contrast.  Contrast: 20mL MULTIHANCE GADOBENATE DIMEGLUMINE 529 MG/ML IV SOLN  Comparison: Radiographs 09/05/2011.  Findings: Study includes most of the foot with the exception of the distal toes and posterior aspect of the calcaneus.  There is subcutaneous edema throughout the foot, especially dorsally in the forefoot.  No focal fluid collections are identified.  There is low- level enhancement of the plantar interosseous muscles within the fourth webspace. There is also mild enhancement surrounding the plantar fascia within the midfoot.  No foreign bodies are identified.  Intraosseous cyst formation within the fibular tip and first metatarsal head appears longstanding.  There is degenerative signal within the tibial sesamoid of the first metatarsal.  There is no evidence of acute fracture, dislocation or osteomyelitis. Incidental vascular remnant is seen anteriorly within the calcaneus.  IMPRESSION:  1.  No evidence of left foot osteomyelitis. 2.  Nonspecific subcutaneous  edema and muscular enhancement within the plantar interosseous muscles laterally.  No focal abscess or foreign body identified. 3.  Scattered mild arthropathic changes.  Original Report Authenticated By: Gerrianne Scale, M.D.   Dg Foot Complete Left  09/05/2011  *RADIOLOGY REPORT*  Clinical Data: 66 year old male with pain and redness.  Swelling. Puncture wound 2 days ago.  LEFT FOOT - COMPLETE 3+ VIEW  Comparison: None.  Findings: No radiopaque foreign body identified.  No subcutaneous gas.  Hallux valgus with metatarsus primus varus.  Joint spaces preserved.  No fracture or dislocation.  IMPRESSION: No retained radiopaque foreign body or subcutaneous gas.  No  fracture or dislocation identified.  Original Report Authenticated By: Harley Hallmark, M.D.     Admission HPI [name] [name] is an 66 y.o. male with history of cardiomyopathy, paroxysmal atrial fibrillation on Pradaxa, status post atrial ablation, depression, hypertension, hyperlipidemia, stepped on a broken piece of glass 3 days prior to presentation to the emergency room. He noted swelling of his left foot with proximal radiation toward his left neck. He denied any fever or chills. He is not sure of his last tetanus shot. Evaluation in emergency room included plain film which showed no retained foreign body, nor any evidence of soft tissues gas. He has leukocytosis with white count of 11,000 and normal renal function tests. He is not diabetic. Hospitalist was asked to admit patient for cellulitis. He was given vancomycin and Rocephin in emergency room.   GENERAL- alert  HEAD- normal atraumatic, no neck masses, normal thyroid, no jvd  RESPIRATORY- clear to auscultation bilaterally.  CVS- regular rate and rhythm, S1, S2 normal, no murmur, click, rub or gallop  ABDOMEN- abdomen is soft without significant tenderness, masses, organomegaly or guarding  NEURO- Grossly normal  EXTREMITIES- decreased left foot edema, especially plantar aspect, decreased erythema and decreased Tenderness. No drainage    Hospital Course by problem list: Principal Problem:  *Cellulitis Active Problems:  PAF (paroxysmal atrial fibrillation)  Sleep apnea  HTN (hypertension)  Cardiomyopathy * Cellulitis of the foot  [name] [name] is a pleasant 67 y.o. male admitted with cellulitis of left foot, after he stepped on broken glass in in his kitchen. Foot xray did not show retained foreign object but he remains quite tender on the plantar aspect of the left foot. He was started on vanc/zosyn at admission. Orthopedics was consulted to see the patient and given her focal tenderness or noted at the midfoot for an MRI was  ordered to evaluate for abscess and the plan was to take him to surgery pending the MRI. The MRI came back negative for osteomyelitis and submitted for abscess. The patient also improved clinically- with decreased pain swelling and erythema and Dr Victorino Dike followed up and stated that there was no indication for surgical treatment since there was no foreign body and no abscess. His antibiotics were changed to oral and she has continued to improve, remained afebrile hemodynamically stable with no leukocytosis. I discussed antibiotics with ID and the agreed with Augmentin which she is to continue upon discharge and follow up outpatient with Dr. Victorino Dike and his PCP. * PAF (paroxysmal atrial fibrillation)- rate controlled on his outpatient medications, and once it was determined that he would not need surgery his Pradaxa was resumed..   * Sleep apnea/ HTN (hypertension)/ Cardiomyopathy- stable, he was maintained on his outpatient medications and his to followup outpt upon discharge.   Discharge Vitals:  BP 153/87  Pulse 71  Temp(Src) 98.7 F (  37.1 C) (Oral)  Resp 18  Ht 6\' 3"  (1.905 m)  Wt 96.4 kg (212 lb 8.4 oz)  BMI 26.56 kg/m2  SpO2 93%  Discharge Labs: No results found for this or any previous visit (from the past 24 hour(s)).  SignedKela Millin 09/08/2011, 9:14 AM

## 2011-09-08 NOTE — Progress Notes (Signed)
Subjective: Pt says the foot is feeling better but is still a little bit sore.  Able to walk on it now.  Objective: Vital signs in last 24 hours: Temp:  [98.7 F (37.1 C)] 98.7 F (37.1 C) (06/04 0528) Pulse Rate:  [69-78] 71  (06/04 0528) Resp:  [18] 18  (06/04 0528) BP: (142-154)/(81-87) 153/87 mmHg (06/04 0528) SpO2:  [93 %-100 %] 93 % (06/04 0528)  Intake/Output from previous day:   Intake/Output this shift:     Basename 09/07/11 0528 09/06/11 0540 09/05/11 1733  HGB 11.9* 12.0* 13.3    Basename 09/07/11 0528 09/06/11 0540  WBC 7.1 7.6  RBC 4.11* 4.13*  HCT 36.3* 36.2*  PLT 203 188    Basename 09/07/11 0528 09/06/11 0540  NA 139 141  K 4.4 3.9  CL 102 105  CO2 26 24  BUN 12 12  CREATININE 0.89 0.75  GLUCOSE 102* 109*  CALCIUM 9.1 9.1   No results found for this basename: LABPT:2,INR:2 in the last 72 hours  PE:  Left foot cellulitis has resolved.  Minimal swelling.  Slightly ttp at planar medial arch.  No drainage.  Assessment/Plan: Cellulitis - oral abx seem to be treating the cellulitis adequately.  I believe it's safe for him to be discharged home today.  I'll see him back next week for a wound check.  He can be WBAT and does not need any additional anticoagulation aside from his baseline Pradaxa.   Toni Arthurs 09/08/2011, 8:34 AM

## 2011-09-12 LAB — CULTURE, BLOOD (ROUTINE X 2): Culture  Setup Time: 201306020300

## 2011-09-23 ENCOUNTER — Other Ambulatory Visit: Payer: Self-pay | Admitting: Family Medicine

## 2011-09-23 DIAGNOSIS — Z0000 Encounter for general adult medical examination without abnormal findings: Secondary | ICD-10-CM

## 2011-09-28 ENCOUNTER — Ambulatory Visit
Admission: RE | Admit: 2011-09-28 | Discharge: 2011-09-28 | Disposition: A | Discharge status: Home / Self Care | Payer: Medicare Other | Source: Ambulatory Visit | Attending: Family Medicine | Admitting: Family Medicine

## 2011-09-28 DIAGNOSIS — Z0000 Encounter for general adult medical examination without abnormal findings: Secondary | ICD-10-CM

## 2011-09-28 DIAGNOSIS — H18603 Keratoconus, unspecified, bilateral: Secondary | ICD-10-CM | POA: Insufficient documentation

## 2011-09-28 DIAGNOSIS — IMO0002 Reserved for concepts with insufficient information to code with codable children: Secondary | ICD-10-CM | POA: Insufficient documentation

## 2011-11-23 ENCOUNTER — Encounter: Payer: Self-pay | Admitting: Internal Medicine

## 2011-11-23 ENCOUNTER — Ambulatory Visit (INDEPENDENT_AMBULATORY_CARE_PROVIDER_SITE_OTHER): Payer: Medicare Other | Admitting: Internal Medicine

## 2011-11-23 VITALS — BP 132/78 | HR 73 | Resp 18 | Ht 74.0 in | Wt 210.8 lb

## 2011-11-23 DIAGNOSIS — I4891 Unspecified atrial fibrillation: Secondary | ICD-10-CM

## 2011-11-23 NOTE — Patient Instructions (Signed)
Your physician wants you to follow-up in: 1 year. You will receive a reminder letter in the mail two months in advance. If you don't receive a letter, please call our office to schedule the follow-up appointment.  

## 2011-11-23 NOTE — Assessment & Plan Note (Signed)
Well controlled No changes at this time.  His CHADSVASC score is 2.  He would like to continue pradaxa at this time. No changes today.

## 2011-11-23 NOTE — Progress Notes (Signed)
PCP: Beverley Fiedler, MD Primary Cardiologist:  Dr Jackolyn Confer is a 66 y.o. male who presents today for routine electrophysiology followup.  Since last being seen in our clinic, the patient reports doing very well.   He has had no further afib.  He is tolerating CPAP.  Today, he denies symptoms of palpitations, chest pain, shortness of breath,  lower extremity edema, dizziness, presyncope, or syncope.  The patient is otherwise without complaint today.   Past Medical History  Diagnosis Date  . Paroxysmal atrial fibrillation     s/p afib 2001 and 2009 by Dr Delena Serve  . Coronary artery disease     no significant CAD by cath 5/12  . Cardiomyopathy     hx of tachycardia mediated CM, now resolved  . Hypertension   . Hyperlipidemia   . Thoracic aortic aneurysm   . Depression   . Sleep apnea     mild   Past Surgical History  Procedure Date  . Cardiac catheterization 08/25/10    NO SIGNIFICANT CAD  . Cardioversion   . Elbow / upper arm foreign body removal   . Incision and drainage of wound 09/07/2011    Procedure: IRRIGATION AND DEBRIDEMENT WOUND;  Surgeon: Nestor Lewandowsky, MD;  Location: [facility] OR;  Service: Orthopedics;  Laterality: Left;    Current Outpatient Prescriptions  Medication Sig Dispense Refill  . atorvastatin (LIPITOR) 40 MG tablet Take 40 mg by mouth at bedtime.       . dabigatran (PRADAXA) 150 MG CAPS Take 150 mg by mouth every 12 (twelve) hours.        . finasteride (PROPECIA) 1 MG tablet Take 1 mg by mouth every morning.       . flecainide (TAMBOCOR) 100 MG tablet Take 100 mg by mouth 2 (two) times daily.        . metoprolol succinate (TOPROL-XL) 50 MG 24 hr tablet Take 50 mg by mouth at bedtime. Take with or immediately following a meal.      . saw palmetto 160 MG capsule Take 450 mg by mouth 2 (two) times daily.       . Vitamin D, Ergocalciferol, (DRISDOL) 50000 UNITS CAPS Take 50,000 Units by mouth every 7 (seven) days. Takes on Sunday        Physical  Exam: Filed Vitals:   11/23/11 1235  BP: 132/78  Pulse: 73  Resp: 18  Height: 6\' 2"  (1.88 m)  Weight: 210 lb 12.8 oz (95.618 kg)  SpO2: 98%    GEN- The patient is well appearing, alert and oriented x 3 today.   Head- normocephalic, atraumatic Eyes-  Sclera clear, conjunctiva pink Ears- hearing intact Oropharynx- clear Lungs- Clear to ausculation bilaterally, normal work of breathing Heart- Regular rate and rhythm, no murmurs, rubs or gallops, PMI not laterally displaced GI- soft, NT, ND, + BS Extremities- no clubbing, cyanosis, or edema  ekg today reveals sinus rhythm 72 bpm, PR 212, Qtc 431, poor R wave progression  Assessment and Plan:

## 2012-09-05 ENCOUNTER — Encounter: Payer: Self-pay | Admitting: Pharmacist Clinician (PhC)/ Clinical Pharmacy Specialist

## 2012-09-06 ENCOUNTER — Ambulatory Visit (INDEPENDENT_AMBULATORY_CARE_PROVIDER_SITE_OTHER): Payer: Medicare Other | Admitting: Cardiovascular Disease

## 2012-09-06 VITALS — BP 112/80 | Ht 74.0 in | Wt 214.5 lb

## 2012-09-06 DIAGNOSIS — G4730 Sleep apnea, unspecified: Secondary | ICD-10-CM

## 2012-09-06 DIAGNOSIS — I480 Paroxysmal atrial fibrillation: Secondary | ICD-10-CM

## 2012-09-06 DIAGNOSIS — I10 Essential (primary) hypertension: Secondary | ICD-10-CM

## 2012-09-06 DIAGNOSIS — I4891 Unspecified atrial fibrillation: Secondary | ICD-10-CM

## 2012-09-06 NOTE — Patient Instructions (Addendum)
Your physician recommends that you schedule a follow-up appointment in: 1 year  

## 2012-09-07 ENCOUNTER — Other Ambulatory Visit: Payer: Self-pay | Admitting: Cardiovascular Disease

## 2012-09-12 ENCOUNTER — Encounter: Payer: Self-pay | Admitting: Cardiovascular Disease

## 2012-09-28 ENCOUNTER — Encounter: Payer: Self-pay | Admitting: Cardiovascular Disease

## 2012-09-28 NOTE — Progress Notes (Signed)
Patient ID: [name] [name], male   DOB: 1945/07/08, 67 y.o.   MRN: [id number]     HPI: EMRIK ERHARD, is a 67 y.o. male who has a history paroxysmal atrial fibrillation, complex obstructive sleep apnea which he now uses adapt several ventilation, and hyperlipidemia.   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 Ronn Melena initially in 2001 and his last being in 2009. When I initially cared for him in May 2012 when he presented with recurrent atrial fibrillation at that time, I started him on anticoagulation with back and he was given a retrial with flecainide. I was also concerned that he was having most likely significant sleep apnea which may also have been somewhat contributory to his recurrent AF. He subsequently had a sleep study which showed oxygen desaturation to 67%. He had some difficulty with CPAP, was ultimately switched to BiPAP, and more recently had to be changed to adapt several ventilation with marked improvement in his symptomatology. He had been set at a minimum CPAP pressure of 6 with a maximum BiPAP pressure of 25.   Mr. Favila will be going again this summer to [place] in [place] we will be putting on a large production. Once he retires from this position it Scientist, physiological of [employer] he may ultimately plan to retire to [place].   He presently is unaware of any breakthrough atrial fibrillation. He denies chest pressure. He denies residual sleepiness.   Past Medical History  Diagnosis Date  . Paroxysmal atrial fibrillation     s/p afib 2001 and 2009 by Dr Delena Serve  . Coronary artery disease     no significant CAD by cath 5/12  . Cardiomyopathy     hx of tachycardia mediated CM, now resolved  . Hypertension   . Hyperlipidemia   . Thoracic aortic aneurysm   . Depression   . Sleep  apnea     AHI avg 5/hr  . Limb pain 06/20/2007    LE venous doppler - no evidence of thrombus or thrombophlebitis  . PVC (premature ventricular contraction) 12/18/2009    cardionet monitor 21 days - some PVCs, ventricular bigeminy    Past Surgical History  Procedure Laterality Date  . Cardiac catheterization  08/25/10    NO SIGNIFICANT CAD  . Cardioversion  10/18/2007    successful  . Elbow / upper arm foreign body removal    . Incision and drainage of wound  09/07/2011    Procedure: IRRIGATION AND DEBRIDEMENT WOUND;  Surgeon: Nestor Lewandowsky, MD;  Location: [facility] OR;  Service: Orthopedics;  Laterality: Left;  . Transesophageal echocardiogram  08/26/2010    see Notes tab  . Cardiovascular stress test  09/08/2005    R/S MV - EF 61%; Exercises capacity 12 Mets; essentially normal perfusion myocardial scan    . Cardiac electrophysiology mapping and ablation  [id number]    Dr. Ronn Melena    Allergies  Allergen Reactions  . Tetracyclines & Related Hives    Current Outpatient Prescriptions  Medication Sig Dispense Refill  . atorvastatin (LIPITOR) 40 MG tablet Take 40 mg by mouth at bedtime.       . dabigatran (PRADAXA) 150 MG CAPS Take 150 mg by mouth every 12 (twelve) hours.        . finasteride (PROPECIA) 1 MG tablet Take 1 mg by  mouth every morning.       . metoprolol succinate (TOPROL-XL) 50 MG 24 hr tablet Take 50 mg by mouth at bedtime. Take with or immediately following a meal.      . NON FORMULARY Uses BIPAP      . saw palmetto 160 MG capsule Take 450 mg by mouth 2 (two) times daily.       . Vitamin D, Ergocalciferol, (DRISDOL) 50000 UNITS CAPS Take 50,000 Units by mouth every 7 (seven) days. Takes on Sunday      . flecainide (TAMBOCOR) 100 MG tablet TAKE 1 TABLET BY MOUTH TWICE A DAY  180 tablet  1   No current facility-administered medications for this visit.    Socially he is single with no children. 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 Is no recent tobacco use. He  does drink wine with dinner.  ROS is negative for fever chills or night sweats. He denies breakthrough atrial fibrillation. He denies shortness of breath. He denies chest pain. He feels he is sleeping well. He denies restless legs. He denies bruxism. He denies daytime sleepiness. He feels markedly improved with adapt servo-ventilation.  PE BP 112/80  Ht 6\' 2"  (1.88 m)  Wt 214 lb 8 oz (97.297 kg)  BMI 27.53 kg/m2  General: Alert, oriented, no distress.  Skin: normal turgor, no rashes HEENT: Normocephalic, atraumatic. Pupils round and reactive; sclera anicteric;no lid lag,  Nose without nasal septal hypertrophy Mouth/Parynx benign; Mallinpatti scale 3 Neck: No JVD, no carotid briuts Lungs: clear to ausculatation and percussion; no wheezing or rales Heart: RRR, s1 s2 normal with a trace systolic murmur. Abdomen: soft, nontender; no hepatosplenomehaly, BS+; abdominal aorta nontender and not dilated by palpation. Pulses 2+ Extremities: no clubbing cyanosis or edema, Homan's sign negative  Neurologic: grossly nonfocal  ECG: Sinus rhythm at 77. Borderline first degree AV block. No ectopy  LABS:  BMET    Component Value Date/Time   NA 139 09/07/2011 0528   K 4.4 09/07/2011 0528   CL 102 09/07/2011 0528   CO2 26 09/07/2011 0528   GLUCOSE 102* 09/07/2011 0528   BUN 12 09/07/2011 0528   CREATININE 0.89 09/07/2011 0528   CALCIUM 9.1 09/07/2011 0528   GFRNONAA 88* 09/07/2011 0528   GFRAA >90 09/07/2011 0528     Hepatic Function Panel     Component Value Date/Time   PROT 6.1 09/07/2011 0528   ALBUMIN 3.3* 09/07/2011 0528   AST 29 09/07/2011 0528   ALT 16 09/07/2011 0528   ALKPHOS 50 09/07/2011 0528   BILITOT 0.6 09/07/2011 0528     CBC    Component Value Date/Time   WBC 7.1 09/07/2011 0528   RBC 4.11* 09/07/2011 0528   HGB 11.9* 09/07/2011 0528   HCT 36.3* 09/07/2011 0528   PLT 203 09/07/2011 0528   MCV 88.3 09/07/2011 0528   MCH 29.0 09/07/2011 0528   MCHC 32.8 09/07/2011 0528   RDW 13.5 09/07/2011 0528    LYMPHSABS 1.8 09/05/2011 1733   MONOABS 1.4* 09/05/2011 1733   EOSABS 0.1 09/05/2011 1733   BASOSABS 0.0 09/05/2011 1733     BNP No results found for this basename: probnp    Lipid Panel     Component Value Date/Time   CHOL  Value: 188        ATP III CLASSIFICATION:  <200     mg/dL   Desirable  161-096  mg/dL   Borderline High  >=045    mg/dL  High 08/23/2007 0910   TRIG 63 08/23/2007 0910   HDL 47 08/23/2007 0910   CHOLHDL 4.0 08/23/2007 0910   VLDL 13 08/23/2007 0910   LDLCALC  Value: 128        Total Cholesterol/HDL:CHD Risk Coronary Heart Disease Risk Table                     Men   Women  1/2 Average Risk   3.4   3.3* 08/23/2007 0910     RADIOLOGY: No results found.    ASSESSMENT AND PLAN:  From a cardiac standpoint, Mr. Kosta is maintaining sinus rhythm without recurrent atrial fibrillation. He is flecainide 100 mg 3 times a day Toprol-XL 50 mg and is on per DACs and 150 mg twice a day for anticoagulation. He's tolerating atorvastatin 40 mg for his hyperlipidemia. He feels well rested. He is using his adapt servo ventilation treatment for sleep apnea with 100% compliance. This summer he will be spending several months and [place]. I will see him in 6 months for followup    Lennette Bihari, MD, [facility]  09/28/2012 10:38 PM

## 2012-09-30 HISTORY — PX: OTHER SURGICAL HISTORY: SHX169

## 2012-11-28 ENCOUNTER — Ambulatory Visit (INDEPENDENT_AMBULATORY_CARE_PROVIDER_SITE_OTHER): Payer: Medicare Other | Admitting: Internal Medicine

## 2012-11-28 ENCOUNTER — Encounter: Payer: Self-pay | Admitting: Internal Medicine

## 2012-11-28 VITALS — BP 142/102 | HR 75 | Ht 74.0 in | Wt 218.0 lb

## 2012-11-28 DIAGNOSIS — I4891 Unspecified atrial fibrillation: Secondary | ICD-10-CM

## 2012-11-28 DIAGNOSIS — I10 Essential (primary) hypertension: Secondary | ICD-10-CM

## 2012-11-28 DIAGNOSIS — I480 Paroxysmal atrial fibrillation: Secondary | ICD-10-CM

## 2012-11-28 DIAGNOSIS — G4730 Sleep apnea, unspecified: Secondary | ICD-10-CM

## 2012-11-28 NOTE — Patient Instructions (Addendum)
Your physician wants you to follow-up in: one year with Dr. Johney Frame.  You will receive a reminder letter in the mail two months in advance. If you don't receive a letter, please call our office to schedule the follow-up appointment.  Your physician recommends that you continue on your current medications as directed. Please refer to the Current Medication list given to you today.

## 2012-11-28 NOTE — Progress Notes (Signed)
PCP: Beverley Fiedler, MD Primary Cardiologist:  Dr Jackolyn Confer is a 67 y.o. male who presents today for routine electrophysiology followup.  Since last being seen in our clinic, the patient reports doing very well.   He has had no further afib.  He is tolerating CPAP.  He fell while in [place] and fractured his L wrist.  He is healing post surgery. Today, he denies symptoms of palpitations, chest pain, shortness of breath,  lower extremity edema, dizziness, presyncope, or syncope.  The patient is otherwise without complaint today.   Past Medical History  Diagnosis Date  . Paroxysmal atrial fibrillation     s/p afib 2001 and 2009 by Dr Delena Serve  . Coronary artery disease     no significant CAD by cath 5/12  . Cardiomyopathy     hx of tachycardia mediated CM, now resolved  . Hypertension   . Hyperlipidemia   . Thoracic aortic aneurysm   . Depression   . Sleep apnea     AHI avg 5/hr  . Limb pain 06/20/2007    LE venous doppler - no evidence of thrombus or thrombophlebitis  . PVC (premature ventricular contraction) 12/18/2009    cardionet monitor 21 days - some PVCs, ventricular bigeminy   Past Surgical History  Procedure Laterality Date  . Cardiac catheterization  08/25/10    NO SIGNIFICANT CAD  . Cardioversion  10/18/2007    successful  . Elbow / upper arm foreign body removal    . Incision and drainage of wound  09/07/2011    Procedure: IRRIGATION AND DEBRIDEMENT WOUND;  Surgeon: Nestor Lewandowsky, MD;  Location: [facility] OR;  Service: Orthopedics;  Laterality: Left;  . Transesophageal echocardiogram  08/26/2010    see Notes tab  . Cardiovascular stress test  09/08/2005    R/S MV - EF 61%; Exercises capacity 12 Mets; essentially normal perfusion myocardial scan    . Cardiac electrophysiology mapping and ablation  [id number]    Dr. Ronn Melena    Current Outpatient Prescriptions  Medication Sig Dispense Refill  . atorvastatin (LIPITOR) 40 MG tablet Take 40 mg by mouth at  bedtime.       . dabigatran (PRADAXA) 150 MG CAPS Take 150 mg by mouth every 12 (twelve) hours.        . finasteride (PROPECIA) 1 MG tablet Take 1 mg by mouth every morning.       . flecainide (TAMBOCOR) 100 MG tablet TAKE 1 TABLET BY MOUTH TWICE A DAY  180 tablet  1  . metoprolol succinate (TOPROL-XL) 50 MG 24 hr tablet Take 50 mg by mouth at bedtime. Take with or immediately following a meal.      . NON FORMULARY Uses BIPAP      . saw palmetto 160 MG capsule Take 450 mg by mouth 2 (two) times daily.       . Vitamin D, Ergocalciferol, (DRISDOL) 50000 UNITS CAPS Take 2,000 Units by mouth every 7 (seven) days. Takes on Sunday       No current facility-administered medications for this visit.    Physical Exam: Filed Vitals:   11/28/12 1131  BP: 142/102  Pulse: 75  Height: 6\' 2"  (1.88 m)  Weight: 218 lb (98.884 kg)    GEN- The patient is well appearing, alert and oriented x 3 today.   Head- normocephalic, atraumatic Eyes-  Sclera clear, conjunctiva pink Ears- hearing intact Oropharynx- clear Lungs- Clear to ausculation bilaterally, normal work of breathing Heart- Regular rate  and rhythm, no murmurs, rubs or gallops, PMI not laterally displaced GI- soft, NT, ND, + BS Extremities- no clubbing, cyanosis, or edema  ekg today reveals sinus rhythm   Assessment and Plan:  1. afib Maintaining sinus rhythm with flecainide No changes today  2. OSA Compliance with CPAP encouraged  3. HTN Above goal due to pain and heavy NSAID use with recent fracture No changes at this time  Return in 1 year

## 2012-12-11 ENCOUNTER — Other Ambulatory Visit: Payer: Self-pay | Admitting: Cardiovascular Disease

## 2012-12-12 NOTE — Telephone Encounter (Signed)
Rx was sent to pharmacy electronically. 

## 2013-01-12 ENCOUNTER — Other Ambulatory Visit: Payer: Self-pay | Admitting: Cardiovascular Disease

## 2013-01-13 NOTE — Telephone Encounter (Signed)
Rx was sent to pharmacy electronically. 

## 2013-02-06 ENCOUNTER — Ambulatory Visit (INDEPENDENT_AMBULATORY_CARE_PROVIDER_SITE_OTHER): Payer: Medicare Other | Admitting: Cardiology

## 2013-02-06 ENCOUNTER — Other Ambulatory Visit: Payer: Self-pay | Admitting: *Deleted

## 2013-02-06 ENCOUNTER — Encounter: Payer: Self-pay | Admitting: Cardiology

## 2013-02-06 ENCOUNTER — Encounter: Payer: Self-pay | Admitting: Cardiovascular Disease

## 2013-02-06 VITALS — BP 138/62 | HR 110 | Ht 74.0 in | Wt 219.9 lb

## 2013-02-06 DIAGNOSIS — G4730 Sleep apnea, unspecified: Secondary | ICD-10-CM

## 2013-02-06 DIAGNOSIS — R0602 Shortness of breath: Secondary | ICD-10-CM

## 2013-02-06 DIAGNOSIS — I10 Essential (primary) hypertension: Secondary | ICD-10-CM

## 2013-02-06 DIAGNOSIS — I480 Paroxysmal atrial fibrillation: Secondary | ICD-10-CM

## 2013-02-06 DIAGNOSIS — R609 Edema, unspecified: Secondary | ICD-10-CM

## 2013-02-06 DIAGNOSIS — Z7901 Long term (current) use of anticoagulants: Secondary | ICD-10-CM | POA: Insufficient documentation

## 2013-02-06 DIAGNOSIS — R600 Localized edema: Secondary | ICD-10-CM

## 2013-02-06 DIAGNOSIS — I4891 Unspecified atrial fibrillation: Secondary | ICD-10-CM

## 2013-02-06 DIAGNOSIS — Z01810 Encounter for preprocedural cardiovascular examination: Secondary | ICD-10-CM

## 2013-02-06 MED ORDER — POTASSIUM CHLORIDE CRYS ER 20 MEQ PO TBCR: 20.0000 meq | EXTENDED_RELEASE_TABLET | Freq: Every day | ORAL | Status: DC

## 2013-02-06 MED ORDER — FUROSEMIDE 40 MG PO TABS: 40.0000 mg | ORAL_TABLET | Freq: Every day | ORAL | Status: DC

## 2013-02-06 NOTE — Progress Notes (Signed)
02/06/2013   PCP: Beverley Fiedler, MD   Chief Complaint  Patient presents with  . Breathing Problem    fracture ulna left arm; b/p still evelated, sob at odd times,not sleeping ,choking with use of BI-PAP ,ALWAYS IN PAIN,SLEEPING IN CHAIR WITH OUT BI PAP    Primary Cardiologist: Dr. Tresa Endo- general/ Dr. Johney Frame -EP  HPI:  67 year old gentleman with a history of PAF and complex obstructive sleep apnea presents today due to elevated blood pressure. He also complained of inability to use his CPAP machine and he is call the company but now have called him back at that time.  Patient recently has been in [place] in [place] doing a large production.  He is Engineer, maintenance at [employer].   He is status post 2 prior atrial fibrillation ablation procedures which were done by Dr. Ronn [name] initially in 2001 and his last being in 2009. When Dr. Tresa Endo initially cared for him in May 2012 when he presented with recurrent atrial fibrillation at that time, Dr. Tresa Endo started him on anticoagulation with Pradaxa and he was given a retrial with flecainide. Dr. Tresa Endo was also concerned that he was having most likely significant sleep apnea which may also have been somewhat contributory to his recurrent AF. He subsequently had a sleep study which showed oxygen desaturation to 67%. He had some difficulty with CPAP, was ultimately switched to BiPAP, and more recently had to be changed to adapt several ventilation with marked improvement in his symptomatology. He had been set at a minimum CPAP pressure of 6 with a maximum BiPAP pressure of 25.    Dr. Johney Frame follows his P. atrial fib and Dr. Tresa Endo his sleep  Apnea and general cardiology.  Over last 2 weeks pt has had increasing SOB.   He cannot lie down to sleep has to sit up in chair.  His BP has also been elevated.  Some of the sleeping issues he attributes to his Cpap.  ( he is back from [place] where he tripped on  cobblestones and fractured his left arm. He had plates placed and the wrist is still wrapped and he is also attending physical therapy).    EKG today reveals atrial fib with RVR of 110.  No acute changes otherwise.   He was not aware that he was in atrial fib.  I contacted Dr. Johney Frame and he would like Dr. Tresa Endo to proceed with DCCV and then arrange for the pt to see Dr. Johney Frame in 4 weeks. The pt is already on flecainide and toprol.  Allergies  Allergen Reactions  . Tetracyclines & Related Hives    Current Outpatient Prescriptions  Medication Sig Dispense Refill  . atorvastatin  (LIPITOR) 40 MG tablet TAKE 1 TABLET BY MOUTH AT BEDTIME  90 tablet  3  . dabigatran (PRADAXA) 150 MG CAPS Take 150 mg by mouth every 12 (twelve) hours.        . finasteride (PROPECIA) 1 MG tablet Take 1 mg by mouth every morning.       . flecainide (TAMBOCOR) 100 MG tablet TAKE 1 TABLET BY MOUTH TWICE A DAY  180 tablet  1  . metoprolol succinate (TOPROL-XL) 100 MG 24 hr tablet TAKE 1 TABLET EVERY DAY  90 tablet  2  . NON FORMULARY Uses BIPAP      . saw palmetto 160 MG capsule Take 450 mg by mouth 2 (two) times daily.       . Vitamin D, Ergocalciferol, (DRISDOL) 50000 UNITS CAPS Take 2,000 Units by mouth every 7 (seven) days. Takes on Sunday      . furosemide (LASIX) 40 MG tablet Take 1 tablet (40 mg total) by mouth daily.  30 tablet  0  . oxyCODONE-acetaminophen (PERCOCET/ROXICET) 5-325 MG per tablet       . potassium chloride SA (K-DUR,KLOR-CON) 20 MEQ tablet Take 1 tablet (20 mEq total) by mouth daily.  30 tablet  0   No current facility-administered medications for this visit.    Past Medical History  Diagnosis Date  . Paroxysmal atrial fibrillation     s/p afib 2001 and 2009 by Dr Delena Serve  . Coronary artery disease     no significant CAD by cath 5/12  . Cardiomyopathy     hx of tachycardia mediated CM, now resolved  . Hypertension   . Hyperlipidemia   . Thoracic aortic aneurysm   . Depression   . Sleep apnea     AHI avg 5/hr  . Limb pain 06/20/2007    LE venous doppler - no evidence of thrombus or thrombophlebitis  . PVC (premature ventricular contraction) 12/18/2009    cardionet monitor 21 days - some PVCs, ventricular bigeminy    Past Surgical History  Procedure Laterality Date  . Cardiac catheterization  08/25/10    NO SIGNIFICANT CAD  . Cardioversion  10/18/2007    successful  . Elbow / upper arm foreign body removal    . Incision and drainage of wound  09/07/2011    Procedure: IRRIGATION AND DEBRIDEMENT WOUND;  Surgeon: Nestor Lewandowsky, MD;  Location: [facility] OR;  Service:  Orthopedics;  Laterality: Left;  . Transesophageal echocardiogram  08/26/2010    see Notes tab  . Cardiovascular stress test  09/08/2005    R/S MV - EF 61%; Exercises capacity 12 Mets; essentially normal perfusion myocardial scan    . Cardiac electrophysiology mapping and ablation  [phone]    Dr. Ronn [name]    VWU:JWJXBJY:NW colds or fevers, no weight changes Skin:no rashes or ulcers HEENT:no blurred vision, no congestion CV:see HPI PUL:see HPI GI:no diarrhea constipation or [name], no indigestion GU:no hematuria, no dysuria MS:+ Lt wrist sprain with complex fracture with + pain, no claudication Neuro:no syncope, no lightheadedness Endo:no diabetes, no thyroid disease + sleep apnea having problems with cpap.  PHYSICAL EXAM BP 138/62  Pulse 110  Ht 6\' 2"  (1.88 m)  Wt 219 lb 14.4 oz (99.746 kg)  BMI 28.22 kg/m2 General:Pleasant affect, NAD Skin:Warm and dry, brisk capillary refill HEENT:normocephalic, sclera clear, mucus membranes moist Neck:supple, no JVD,  no bruits  Heart:irreg irreg without murmur, gallup, rub or click Lungs:clear without rales, rhonchi, or wheezes ZOX:WRUE, non tender, + BS, do not palpate liver spleen or masses Ext:1-2+ lower ext edema to just above the ankles, 2+ pedal pulses, Lt wrist in a cast/splint Neuro:alert and oriented, MAE, follows commands, + facial symmetry  EKG: A fib with RVR at 110.  New from 11/2012.  No other changes.  ASSESSMENT AND PLAN PAF (paroxysmal atrial fibrillation) Now in Atrial fib.  We believe for 2 weeks. Symptomatic with SOB and edema. I added Lasix 40 mg daily and Kdur to his meds.  We are arranging DCCV for Wed. With Dr. Tresa Endo.  Dr. Johney Frame is aware as well.  Labs will be drawn today or AM.      Chronic anticoagulation, pradaxa On Pradaxa, has not missed any doses.  Sleep apnea Has had problems with the machine, he downloaded info in June, but has not heard back.Marland Kitchen  He contacted the provider and they will call him.     Essential hypertension, benign Elevated BP today, may be related to volume overload due to atrial fib.  Diuretic added.

## 2013-02-06 NOTE — Assessment & Plan Note (Addendum)
Now in Atrial fib.  We believe for 2 weeks. Symptomatic with SOB and edema. I added Lasix 40 mg daily and Kdur to his meds.  We are arranging DCCV for Wed. With Dr. Tresa Endo.  Dr. Johney Frame is aware as well.  Labs will be drawn today or AM.

## 2013-02-06 NOTE — Patient Instructions (Addendum)
We will plan cardioversion on Wed. With Dr. Tresa Endo.   Have lab work done today on 1st floor here.     I am adding diuretic Furosemide to help with your shortness of breath and edema and potassium to replace.  No other changes in meds.    Nothing to eat or drink after midnight Tuesday night.

## 2013-02-06 NOTE — Assessment & Plan Note (Signed)
On Pradaxa, has not missed any doses.

## 2013-02-06 NOTE — Assessment & Plan Note (Signed)
Elevated BP today, may be related to volume overload due to atrial fib.  Diuretic added.

## 2013-02-06 NOTE — Assessment & Plan Note (Signed)
Has had problems with the machine, he downloaded info in June, but has not heard back.Marland Kitchen  He contacted the provider and they will call him.

## 2013-02-07 ENCOUNTER — Telehealth: Payer: Self-pay | Admitting: Cardiology

## 2013-02-07 LAB — CBC WITH DIFFERENTIAL/PLATELET
Basophils Absolute: 0 10*3/uL (ref 0.0–0.1)
Eosinophils Absolute: 0.2 10*3/uL (ref 0.0–0.7)
Eosinophils Relative: 2 % (ref 0–5)
HCT: 43 % (ref 39.0–52.0)
Hemoglobin: 14.4 g/dL (ref 13.0–17.0)
Lymphocytes Relative: 23 % (ref 12–46)
Lymphs Abs: 2.3 10*3/uL (ref 0.7–4.0)
MCH: 28.3 pg (ref 26.0–34.0)
MCV: 84.5 fL (ref 78.0–100.0)
Monocytes Absolute: 0.8 10*3/uL (ref 0.1–1.0)
Monocytes Relative: 8 % (ref 3–12)
Platelets: 264 10*3/uL (ref 150–400)
RBC: 5.09 MIL/uL (ref 4.22–5.81)
WBC: 10.2 10*3/uL (ref 4.0–10.5)

## 2013-02-07 LAB — PRO B NATRIURETIC PEPTIDE: Pro B Natriuretic peptide (BNP): 400.9 pg/mL — ABNORMAL HIGH (ref <126)

## 2013-02-07 LAB — BASIC METABOLIC PANEL
CO2: 26 mEq/L (ref 19–32)
Calcium: 9.8 mg/dL (ref 8.4–10.5)
Creat: 0.92 mg/dL (ref 0.50–1.35)
Sodium: 138 mEq/L (ref 135–145)

## 2013-02-07 NOTE — Telephone Encounter (Signed)
lms for patient to call me back regarding appt that's scheduled with dr. Johney Frame for November 21 post cardioversion.

## 2013-02-08 ENCOUNTER — Encounter (HOSPITAL_COMMUNITY): Payer: Self-pay | Admitting: Gastroenterology

## 2013-02-08 ENCOUNTER — Encounter (HOSPITAL_COMMUNITY): Payer: Medicare Other | Admitting: Certified Registered"

## 2013-02-08 ENCOUNTER — Ambulatory Visit (HOSPITAL_COMMUNITY): Payer: Medicare Other | Admitting: Certified Registered"

## 2013-02-08 ENCOUNTER — Encounter (HOSPITAL_COMMUNITY)
Admission: RE | Disposition: A | Discharge status: Home / Self Care | Payer: Self-pay | Source: Ambulatory Visit | Attending: Cardiovascular Disease

## 2013-02-08 ENCOUNTER — Ambulatory Visit (HOSPITAL_COMMUNITY)
Admission: RE | Admit: 2013-02-08 | Discharge: 2013-02-08 | Disposition: A | Discharge status: Home / Self Care | Payer: Medicare Other | Source: Ambulatory Visit | Attending: Cardiovascular Disease | Admitting: Cardiovascular Disease

## 2013-02-08 DIAGNOSIS — Z7901 Long term (current) use of anticoagulants: Secondary | ICD-10-CM | POA: Insufficient documentation

## 2013-02-08 DIAGNOSIS — I10 Essential (primary) hypertension: Secondary | ICD-10-CM | POA: Insufficient documentation

## 2013-02-08 DIAGNOSIS — G4733 Obstructive sleep apnea (adult) (pediatric): Secondary | ICD-10-CM | POA: Insufficient documentation

## 2013-02-08 DIAGNOSIS — Z01810 Encounter for preprocedural cardiovascular examination: Secondary | ICD-10-CM

## 2013-02-08 DIAGNOSIS — I4891 Unspecified atrial fibrillation: Secondary | ICD-10-CM | POA: Insufficient documentation

## 2013-02-08 HISTORY — PX: CARDIOVERSION: SHX1299

## 2013-02-08 SURGERY — CARDIOVERSION
Anesthesia: General | Wound class: Clean

## 2013-02-08 MED ORDER — METOPROLOL SUCCINATE ER 100 MG PO TB24: 100.0000 mg | ORAL_TABLET | Freq: Every day | ORAL | Status: DC

## 2013-02-08 MED ORDER — LIDOCAINE HCL (CARDIAC) 20 MG/ML IV SOLN
INTRAVENOUS | Status: DC | PRN
  Administered 2013-02-08: 20 mg via INTRAVENOUS

## 2013-02-08 MED ORDER — PROPOFOL 10 MG/ML IV BOLUS
INTRAVENOUS | Status: DC | PRN
  Administered 2013-02-08: 80 mg via INTRAVENOUS

## 2013-02-08 MED ORDER — SODIUM CHLORIDE 0.9 % IV SOLN
INTRAVENOUS | Status: DC
  Administered 2013-02-08: 500 mL via INTRAVENOUS

## 2013-02-08 NOTE — CV Procedure (Signed)
   CARDIOVERSION NOTE   Procedure: Electrical Cardioversion Indications:  Atrial Fibrillation  Procedure Details:  Consent: Risks of procedure as well as the alternatives and risks of each were explained to the (patient/caregiver).  Consent for procedure obtained.  Time Out: Verified patient identification, verified procedure, site/side was marked, verified correct patient position, special equipment/implants available, medications/allergies/relevent history reviewed, required imaging and test results available.  Performed  Patient placed on cardiac monitor, pulse oximetry, supplemental oxygen as necessary.  Sedation given: Dr. Randa Evens with propofol 80 mg and 20 mg lidocaine Pacer pads placed anterior and posterior chest.  Cardioverted 1 time(s).  Cardioverted at 120J.  Evaluation: Findings: Post procedure EKG shows: NSR Complications: None Patient did tolerate procedure well.  Successful cardioversion. NSR at 87. ECG ordered.   Lennette Bihari, MD, [facility] 02/08/2013 2:37 PM

## 2013-02-08 NOTE — Interval H&P Note (Signed)
History and Physical Interval Note:  02/08/2013 2:29 PM  [name] [name]  has presented today for surgery, with the diagnosis of A FIB   The various methods of treatment have been discussed with the patient and family. After consideration of risks, benefits and other options for treatment, the patient has consented to  Procedure(s): CARDIOVERSION (N/A) as a surgical intervention .  The patient's history has been reviewed, patient examined, no change in status, stable for surgery.  I have reviewed the patient's chart and labs.  Questions were answered to the patient's satisfaction.     Reyanne Hussar A

## 2013-02-08 NOTE — Anesthesia Preprocedure Evaluation (Signed)
Anesthesia Evaluation  Patient identified by MRN, date of birth, ID band Patient awake    Reviewed: Allergy & Precautions, H&P , NPO status , Patient's Chart, lab work & pertinent test results, reviewed documented beta blocker date and time   Airway Mallampati: I TM Distance: >3 FB Neck ROM: Full    Dental  (+) Teeth Intact   Pulmonary          Cardiovascular hypertension, + dysrhythmias Atrial Fibrillation Rhythm:Irregular Rate:Tachycardia     Neuro/Psych PSYCHIATRIC DISORDERS Depression    GI/Hepatic   Endo/Other    Renal/GU      Musculoskeletal   Abdominal   Peds  Hematology   Anesthesia Other Findings   Reproductive/Obstetrics                           Anesthesia Physical Anesthesia Plan  ASA: III  Anesthesia Plan: General   Post-op Pain Management:    Induction: Intravenous  Airway Management Planned: Mask  Additional Equipment:   Intra-op Plan:   Post-operative Plan:   Informed Consent: I have reviewed the patients History and Physical, chart, labs and discussed the procedure including the risks, benefits and alternatives for the proposed anesthesia with the patient or authorized representative who has indicated his/her understanding and acceptance.   Dental advisory given  Plan Discussed with:   Anesthesia Plan Comments:         Anesthesia Quick Evaluation

## 2013-02-08 NOTE — Anesthesia Postprocedure Evaluation (Signed)
  Anesthesia Post-op Note  Patient: [name] [name]  Procedure(s) Performed: Procedure(s): CARDIOVERSION (N/A)  Patient Location: Endoscopy Unit  Anesthesia Type:General  Level of Consciousness: awake, alert , oriented and patient cooperative  Airway and Oxygen Therapy: Patient Spontanous Breathing and Patient connected to nasal cannula oxygen  Post-op Pain: none  Post-op Assessment: Post-op Vital signs reviewed, Patient's Cardiovascular Status Stable, Respiratory Function Stable, Patent Airway and No signs of Nausea or vomiting  Post-op Vital Signs: Reviewed and stable  Complications: No apparent anesthesia complications

## 2013-02-08 NOTE — Transfer of Care (Signed)
Immediate Anesthesia Transfer of Care Note  Patient: [name] [name]  Procedure(s) Performed: Procedure(s): CARDIOVERSION (N/A)  Patient Location: Endoscopy Unit  Anesthesia Type:General  Level of Consciousness: awake, alert , oriented and patient cooperative  Airway & Oxygen Therapy: Patient Spontanous Breathing and Patient connected to nasal cannula oxygen  Post-op Assessment: Report given to PACU RN and Post -op Vital signs reviewed and stable  Post vital signs: Reviewed and stable  Complications: No apparent anesthesia complications

## 2013-02-08 NOTE — H&P (View-Only) (Signed)
        02/06/2013   PCP: RANKINS,VICTORIA, MD   Chief Complaint  Patient presents with  . Breathing Problem    fracture ulna left arm; b/p still evelated, sob at odd times,not sleeping ,choking with use of BI-PAP ,ALWAYS IN PAIN,SLEEPING IN CHAIR WITH OUT BI PAP    Primary Cardiologist: Dr. Kelly- general/ Dr. Allred -EP  HPI:  67-year-old gentleman with a history of PAF and complex obstructive sleep apnea presents today due to elevated blood pressure. He also complained of inability to use his CPAP machine and he is call the company but now have called him back at that time.  Patient recently has been in [place] in [place] doing a large production.  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   He is status post 2 prior atrial fibrillation ablation procedures which were done by Dr. Marcus Wharton initially in 2001 and his last being in 2009. When Dr. Kelly initially cared for him in May 2012 when he presented with recurrent atrial fibrillation at that time, Dr. Kelly started him on anticoagulation with Pradaxa and he was given a retrial with flecainide. Dr. Kelly was also concerned that he was having most likely significant sleep apnea which may also have been somewhat contributory to his recurrent AF. He subsequently had a sleep study which showed oxygen desaturation to 67%. He had some difficulty with CPAP, was ultimately switched to BiPAP, and more recently had to be changed to adapt several ventilation with marked improvement in his symptomatology. He had been set at a minimum CPAP pressure of 6 with a maximum BiPAP pressure of 25.    Dr. Allred follows his P. atrial fib and Dr. Kelly his sleep  Apnea and general cardiology.  Over last 2 weeks pt has had increasing SOB.   He cannot lie down to sleep has to sit up in chair.  His BP has also been elevated.  Some of the sleeping issues he attributes to his Cpap.  ( he is back from [place] where he tripped on  cobblestones and fractured his left arm. He had plates placed and the wrist is still wrapped and he is also attending physical therapy).    EKG today reveals atrial fib with RVR of 110.  No acute changes otherwise.   He was not aware that he was in atrial fib.  I contacted Dr. Allred and he would like Dr. Kelly to proceed with DCCV and then arrange for the pt to see Dr. Allred in 4 weeks. The pt is already on flecainide and toprol.                                                                                                                                                                                                                                                                                                                                                                                                                                                                                                                                                                                                                                                                                                                                                                                                                                                                                                                                                                                                                                                                                                                                                                                                                                                                                                                                                                                                    Allergies  Allergen Reactions  . Tetracyclines & Related Hives    Current Outpatient Prescriptions  Medication Sig Dispense Refill  . atorvastatin  (LIPITOR) 40 MG tablet TAKE 1 TABLET BY MOUTH AT BEDTIME  90 tablet  3  . dabigatran (PRADAXA) 150 MG CAPS Take 150 mg by mouth every 12 (twelve) hours.        . finasteride (PROPECIA) 1 MG tablet Take 1 mg by mouth every morning.       . flecainide (TAMBOCOR) 100 MG tablet TAKE 1 TABLET BY MOUTH TWICE A DAY  180 tablet  1  . metoprolol succinate (TOPROL-XL) 100 MG 24 hr tablet TAKE 1 TABLET EVERY DAY  90 tablet  2  . NON FORMULARY Uses BIPAP      . saw palmetto 160 MG capsule Take 450 mg by mouth 2 (two) times daily.       . Vitamin D, Ergocalciferol, (DRISDOL) 50000 UNITS CAPS Take 2,000 Units by mouth every 7 (seven) days. Takes on Sunday      . furosemide (LASIX) 40 MG tablet Take 1 tablet (40 mg total) by mouth daily.  30 tablet  0  . oxyCODONE-acetaminophen (PERCOCET/ROXICET) 5-325 MG per tablet       . potassium chloride SA (K-DUR,KLOR-CON) 20 MEQ tablet Take 1 tablet (20 mEq total) by mouth daily.  30 tablet  0   No current facility-administered medications for this visit.    Past Medical History  Diagnosis Date  . Paroxysmal atrial fibrillation     s/p afib 2001 and 2009 by Dr Wharton  . Coronary artery disease     no significant CAD by cath 5/12  . Cardiomyopathy     hx of tachycardia mediated CM, now resolved  . Hypertension   . Hyperlipidemia   . Thoracic aortic aneurysm   . Depression   . Sleep apnea     AHI avg 5/hr  . Limb pain 06/20/2007    LE venous doppler - no evidence of thrombus or thrombophlebitis  . PVC (premature ventricular contraction) 12/18/2009    cardionet monitor 21 days - some PVCs, ventricular bigeminy    Past Surgical History  Procedure Laterality Date  . Cardiac catheterization  08/25/10    NO SIGNIFICANT CAD  . Cardioversion  10/18/2007    successful  . Elbow / upper arm foreign body removal    . Incision and drainage of wound  09/07/2011    Procedure: IRRIGATION AND DEBRIDEMENT WOUND;  Surgeon: Frank J Rowan, MD;  Location: [facility] OR;  Service:  Orthopedics;  Laterality: Left;  . Transesophageal echocardiogram  08/26/2010    see Notes tab  . Cardiovascular stress test  09/08/2005    R/S MV - EF 61%; Exercises capacity 12 Mets; essentially normal perfusion myocardial scan    . Cardiac electrophysiology mapping and ablation  2001,2009    Dr. Marcus Wharton    ROS:General:no colds or fevers, no weight changes Skin:no rashes or ulcers HEENT:no blurred vision, no congestion CV:see HPI PUL:see HPI GI:no diarrhea constipation or melena, no indigestion GU:no hematuria, no dysuria MS:+ Lt wrist sprain with complex fracture with + pain, no claudication Neuro:no syncope, no lightheadedness Endo:no diabetes, no thyroid disease + sleep apnea having problems with cpap.  PHYSICAL EXAM BP 138/62  Pulse 110  Ht 6' 2" (1.88 m)  Wt 219 lb 14.4 oz (99.746 kg)  BMI 28.22 kg/m2 General:Pleasant affect, NAD Skin:Warm and dry, brisk capillary refill HEENT:normocephalic, sclera clear, mucus membranes moist Neck:supple, no JVD,   no bruits  Heart:irreg irreg without murmur, gallup, rub or click Lungs:clear without rales, rhonchi, or wheezes Abd:soft, non tender, + BS, do not palpate liver spleen or masses Ext:1-2+ lower ext edema to just above the ankles, 2+ pedal pulses, Lt wrist in a cast/splint Neuro:alert and oriented, MAE, follows commands, + facial symmetry  EKG: A fib with RVR at 110.  New from 11/2012.  No other changes.  ASSESSMENT AND PLAN PAF (paroxysmal atrial fibrillation) Now in Atrial fib.  We believe for 2 weeks. Symptomatic with SOB and edema. I added Lasix 40 mg daily and Kdur to his meds.  We are arranging DCCV for Wed. With Dr. Kelly.  Dr. Allred is aware as well.  Labs will be drawn today or AM.      Chronic anticoagulation, pradaxa On Pradaxa, has not missed any doses.  Sleep apnea Has had problems with the machine, he downloaded info in June, but has not heard back..  He contacted the provider and they will call him.     Essential hypertension, benign Elevated BP today, may be related to volume overload due to atrial fib.  Diuretic added.     

## 2013-02-08 NOTE — Progress Notes (Addendum)
Patient's ride home was not available until 5:30 pm today.  [place], Rosanna Randy

## 2013-02-08 NOTE — Progress Notes (Signed)
Quick Note:  These are procedure labs. ______

## 2013-02-09 ENCOUNTER — Encounter (HOSPITAL_COMMUNITY): Payer: Self-pay | Admitting: Cardiovascular Disease

## 2013-02-10 NOTE — Addendum Note (Signed)
Addendum created 02/10/13 1038 by Judie Petit, MD   Modules edited: Anesthesia Attestations

## 2013-02-14 ENCOUNTER — Telehealth: Payer: Self-pay | Admitting: Cardiovascular Disease

## 2013-02-14 NOTE — Telephone Encounter (Signed)
Pt called back and verified x 2.  Pt stated the initial message that was sent was incorrect.  Stated he was not asking of sleep study results.  Stated he wanted to let Dr. Tresa Endo know that he can only sleep sitting up in the chair and he is not going to do the other test he wants done using the BiPAP.  Stated he cannot use it.  Pt stated he has had CHF before and wants to know if he needs to have an Echo.  Stated [facility] also sent over information last week in front of him and it should be in the office.    Pt informed Dr. Beverely Low, CMA will be notified and he will be notified when response given.  Pt verbalized understanding and agreed w/ plan.  Message forwarded to Dr. Pierre Bali, CMA.

## 2013-02-14 NOTE — Telephone Encounter (Signed)
Returned call.  Left message to call back before 4pm.  

## 2013-02-14 NOTE — Telephone Encounter (Signed)
Patient returned Amber's call.  Upset that results was not really his question but rather cannot sleep at all and wants to get resolved.  Very upset.  Please call as soon as possible.

## 2013-02-14 NOTE — Telephone Encounter (Signed)
Please call-wants to know his sleep study results.Wants to speed up the second test,he can not go on like this. He thinks he might be in CHF.

## 2013-02-14 NOTE — Telephone Encounter (Signed)
Returned call.  Left generic message that results not back and will be notified when results received (Sleep Center: abnormal, Office: normal).  Call back before 4pm if questions or other concerns.Marland Kitchen

## 2013-02-14 NOTE — Telephone Encounter (Signed)
Only needs download on current settings

## 2013-02-15 DIAGNOSIS — H02823 Cysts of right eye, unspecified eyelid: Secondary | ICD-10-CM | POA: Insufficient documentation

## 2013-02-17 ENCOUNTER — Telehealth: Payer: Self-pay | Admitting: Cardiovascular Disease

## 2013-02-17 NOTE — Telephone Encounter (Signed)
Patient states [name] [name] at [facility] sent his bipap download on November 7.  He is unable to wear the mask as he wakes up choking in the night and it is terrifying him to the point of night terrors.  He is sleeping in a chair.  Wants to know what else he might be able to do?

## 2013-02-17 NOTE — Telephone Encounter (Signed)
Message forwarded to W. Waddell, CMA.  

## 2013-02-17 NOTE — Telephone Encounter (Signed)
Staff message sent to [facility] to have a respiratory therapist to contact this patient today to try to correct his issues.

## 2013-02-17 NOTE — Telephone Encounter (Signed)
Spoke with patient informing him that Dr. Tresa Endo received his download and found that it was WNL. Patient's symptoms does not appear to be related his BIPAP  machine  working improperly. Patient then asked  my opinion as to what else I feel that his symptoms could be coming from. I stated to patient that since he recently suffered some trauma, it is possible that he may be experiencing some anxiety or panic attacks. Recommended to him to contact his PCP for evaluation. Patient agreed with plan, and will keep [place] informed of his progress.

## 2013-02-22 ENCOUNTER — Encounter: Payer: Self-pay | Admitting: Internal Medicine

## 2013-02-22 ENCOUNTER — Ambulatory Visit (INDEPENDENT_AMBULATORY_CARE_PROVIDER_SITE_OTHER): Payer: Medicare Other | Admitting: Internal Medicine

## 2013-02-22 VITALS — BP 139/86 | HR 74 | Ht 74.5 in | Wt 214.0 lb

## 2013-02-22 DIAGNOSIS — G4730 Sleep apnea, unspecified: Secondary | ICD-10-CM

## 2013-02-22 DIAGNOSIS — I428 Other cardiomyopathies: Secondary | ICD-10-CM

## 2013-02-22 DIAGNOSIS — I4891 Unspecified atrial fibrillation: Secondary | ICD-10-CM

## 2013-02-22 DIAGNOSIS — I429 Cardiomyopathy, unspecified: Secondary | ICD-10-CM

## 2013-02-22 DIAGNOSIS — I10 Essential (primary) hypertension: Secondary | ICD-10-CM

## 2013-02-22 LAB — BASIC METABOLIC PANEL
BUN: 22 mg/dL (ref 6–23)
CO2: 28 mEq/L (ref 19–32)
Calcium: 9.9 mg/dL (ref 8.4–10.5)
Creatinine, Ser: 0.9 mg/dL (ref 0.4–1.5)
GFR: 95.71 mL/min (ref >60.00)
Glucose, Bld: 95 mg/dL (ref 70–99)
Sodium: 137 mEq/L (ref 135–145)

## 2013-02-22 MED ORDER — FUROSEMIDE 40 MG PO TABS: 40.0000 mg | ORAL_TABLET | Freq: Two times a day (BID) | ORAL | Status: DC

## 2013-02-22 NOTE — Patient Instructions (Addendum)
Your physician recommends that you return for lab work in: BMET today  Your physician has requested that you have an echocardiogram. Echocardiography is a painless test that uses sound waves to create images of your heart. It provides your doctor with information about the size and shape of your heart and how well your heart's chambers and valves are working. This procedure takes approximately one hour. There are no restrictions for this procedure.  Your physician has recommended you make the following change in your medication: Lasix to twice a day.  Your physician recommends that you schedule a follow-up appointment in: 2 weeks with Nada Boozer, NP  Your physician recommends that you schedule a follow-up appointment in: 4 weeks with Dr. Johney Frame.

## 2013-02-24 ENCOUNTER — Other Ambulatory Visit (HOSPITAL_COMMUNITY): Payer: Medicare Other

## 2013-02-24 ENCOUNTER — Encounter: Payer: Medicare Other | Admitting: Cardiology

## 2013-02-25 NOTE — Progress Notes (Signed)
PCP:  Beverley Fiedler, MD Primary Cardiologist:  Dr Tresa Endo  The patient presents today for electrophysiology followup after recent afib episode requiring cardioversion.  He fell (mechanical fall) and broke his L wrist several months ago.  He subsequently required surgery for this.  At some point, he developed recurrent afib.  He was not initially aware of his afib but developed gradual increase in shortness of breath with orthopnea, PND, and edema.  He was evaluated by Nada Boozer and initiated on lasix.  He underwent cardioversion.  Though his symptoms have improved with sinus rhythm, he continues to have orthopnea and edema.  He is SOB with moderate activity.  Today, he denies symptoms of palpitations, chest pain,  dizziness, presyncope, syncope, or neurologic sequela.  The patient feels that he is tolerating medications without difficulties and is otherwise without complaint today.   Past Medical History  Diagnosis Date  . Paroxysmal atrial fibrillation     s/p afib 2001 and 2009 by Dr Delena Serve  . Coronary artery disease     no significant CAD by cath 5/12  . Cardiomyopathy     hx of tachycardia mediated CM, now resolved  . Hypertension   . Hyperlipidemia   . Thoracic aortic aneurysm   . Depression   . Sleep apnea     AHI avg 5/hr  . Limb pain 06/20/2007    LE venous doppler - no evidence of thrombus or thrombophlebitis  . PVC (premature ventricular contraction) 12/18/2009    cardionet monitor 21 days - some PVCs, ventricular bigeminy   Past Surgical History  Procedure Laterality Date  . Cardiac catheterization  08/25/10    NO SIGNIFICANT CAD  . Cardioversion  10/18/2007    successful  . Elbow / upper arm foreign body removal    . Incision and drainage of wound  09/07/2011    Procedure: IRRIGATION AND DEBRIDEMENT WOUND;  Surgeon: Nestor Lewandowsky, MD;  Location: [facility] OR;  Service: Orthopedics;  Laterality: Left;  . Transesophageal echocardiogram  08/26/2010    see Notes tab  .  Cardiovascular stress test  09/08/2005    R/S MV - EF 61%; Exercises capacity 12 Mets; essentially normal perfusion myocardial scan    . Cardiac electrophysiology mapping and ablation  [id number]    Dr. Ronn Melena  . Osteosynthesis Left 09/30/2012    with small plate in the ulna  . Cardioversion N/A 02/08/2013    Procedure: CARDIOVERSION;  Surgeon: Lennette Bihari, MD;  Location: [facility];  Service: Cardiovascular;  Laterality: N/A;    Current Outpatient Prescriptions  Medication Sig Dispense Refill  . atorvastatin (LIPITOR) 40 MG tablet TAKE 1 TABLET BY MOUTH AT BEDTIME  90 tablet  3  . dabigatran (PRADAXA) 150 MG CAPS Take 150 mg by mouth every 12 (twelve) hours.        . finasteride (PROPECIA) 1 MG tablet Take 1 mg by mouth every morning.       . flecainide (TAMBOCOR) 100 MG tablet TAKE 1 TABLET BY MOUTH TWICE A DAY  180 tablet  1  . furosemide (LASIX) 40 MG tablet Take 1 tablet (40 mg total) by mouth 2 (two) times daily.  30 tablet  6  . LORazepam (ATIVAN) 0.5 MG tablet 0.5 mg as needed.      . metoprolol succinate (TOPROL-XL) 100 MG 24 hr tablet Take 1 tablet (100 mg total) by mouth daily. Take with or immediately following a meal.  90 tablet  4  . potassium chloride SA (K-DUR,KLOR-CON)  20 MEQ tablet Take 1 tablet (20 mEq total) by mouth daily.  30 tablet  0  . saw palmetto 160 MG capsule Take 450 mg by mouth 2 (two) times daily.       . Vitamin D, Ergocalciferol, (DRISDOL) 50000 UNITS CAPS Take 2,000 Units by mouth every 7 (seven) days. Takes on Sunday       No current facility-administered medications for this visit.    Allergies  Allergen Reactions  . Tetracyclines & Related Hives    History   Social History  . Marital Status: Single    Spouse Name: N/A    Number of Children: 0  . Years of Education: N/A   Occupational History  .  [employer]   Social History Main Topics  . Smoking status: Former Smoker    Quit date: 02/06/1989  . Smokeless tobacco: Not on  file  . Alcohol Use: Yes     Comment: 2 bottles of wine per week  . Drug Use: No  . Sexual Activity: Not on file   Other Topics Concern  . 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  He is single and has no children.    Family History  Problem Relation Age of Onset  . Coronary artery disease Father   . Stroke Mother     died  . Atrial fibrillation Brother   . Heart attack Father   . Mitral valve prolapse Sister     ROS-  All systems are reviewed and are negative except as outlined in the HPI above  Physical Exam: Filed Vitals:   02/22/13 1206  BP: 139/86  Pulse: 74  Height: 6' 2.5" (1.892 m)  Weight: 214 lb (97.07 kg)    GEN- The patient is well appearing, alert and oriented x 3 today.   Head- normocephalic, atraumatic Eyes-  Sclera clear, conjunctiva pink Ears- hearing intact Oropharynx- clear Neck- supple, + JVD Lungs- few basilar rales, normal work of breathing Heart- Regular rate and rhythm, no murmurs, rubs or gallops, PMI not laterally displaced GI- soft, NT, ND, + BS Extremities- no clubbing, cyanosis, +1 edema MS- L arm in a soft fixator Skin- no rash or lesion Psych- euthymic mood, full affect Neuro- strength and sensation are intact  ekg today reveals sinus rhythm 77 bpm, PR 212, Qtc 450, septal infarct pattern  Assessment and Plan:  1. Persistent afib Maintaining sinus rhythm post cardioversion I suspect that this episode was initiated by his mechanical fall/ arm fracture/ surgery His CHADS2VASC score is 2.  He is appropriately anticoagulated with pradaxa. No changes today  2. Shortness of breath/ edema His symptoms are worrisome for CHF.  He is volume overloaded today.  He has previously had a tachycardia mediated cardiomyopathy.  This may have been worsened by recent episodes of afib.  I  will repeat an echo. Increase lasix to twice daily today.  BMET today.  He will follow-up with Nada Boozer in 2 weeks.  If his volume has improved then his lasix can be reduced at that time.  He will likely need a repeat bmet then.  3. OSA Compliance with CPAP is encouraged  4. HTN Stable  Follow-up with Nada Boozer in 2 weeks I will see again in 4 weeks

## 2013-03-05 ENCOUNTER — Other Ambulatory Visit: Payer: Self-pay | Admitting: Cardiovascular Disease

## 2013-03-05 ENCOUNTER — Other Ambulatory Visit: Payer: Self-pay | Admitting: Internal Medicine

## 2013-03-06 ENCOUNTER — Ambulatory Visit (HOSPITAL_COMMUNITY): Payer: Medicare Other | Attending: Internal Medicine | Admitting: Radiology

## 2013-03-06 ENCOUNTER — Encounter: Payer: Self-pay | Admitting: Cardiology

## 2013-03-06 ENCOUNTER — Other Ambulatory Visit: Payer: Self-pay

## 2013-03-06 DIAGNOSIS — I4891 Unspecified atrial fibrillation: Secondary | ICD-10-CM

## 2013-03-06 DIAGNOSIS — I480 Paroxysmal atrial fibrillation: Secondary | ICD-10-CM

## 2013-03-06 DIAGNOSIS — R0602 Shortness of breath: Secondary | ICD-10-CM | POA: Insufficient documentation

## 2013-03-06 DIAGNOSIS — R609 Edema, unspecified: Secondary | ICD-10-CM

## 2013-03-06 DIAGNOSIS — I429 Cardiomyopathy, unspecified: Secondary | ICD-10-CM

## 2013-03-06 DIAGNOSIS — I428 Other cardiomyopathies: Secondary | ICD-10-CM | POA: Insufficient documentation

## 2013-03-06 DIAGNOSIS — R0601 Orthopnea: Secondary | ICD-10-CM | POA: Insufficient documentation

## 2013-03-06 DIAGNOSIS — I079 Rheumatic tricuspid valve disease, unspecified: Secondary | ICD-10-CM | POA: Insufficient documentation

## 2013-03-06 DIAGNOSIS — I059 Rheumatic mitral valve disease, unspecified: Secondary | ICD-10-CM | POA: Insufficient documentation

## 2013-03-06 NOTE — Telephone Encounter (Signed)
Rx was sent to pharmacy electronically. 

## 2013-03-06 NOTE — Progress Notes (Signed)
Echocardiogram performed.  

## 2013-03-07 ENCOUNTER — Other Ambulatory Visit: Payer: Self-pay | Admitting: Cardiovascular Disease

## 2013-03-08 ENCOUNTER — Encounter: Payer: Self-pay | Admitting: Cardiology

## 2013-03-08 ENCOUNTER — Ambulatory Visit (INDEPENDENT_AMBULATORY_CARE_PROVIDER_SITE_OTHER): Payer: Medicare Other | Admitting: Cardiology

## 2013-03-08 VITALS — BP 120/80 | HR 68 | Ht 74.0 in | Wt 216.0 lb

## 2013-03-08 DIAGNOSIS — I3139 Other pericardial effusion (noninflammatory): Secondary | ICD-10-CM | POA: Insufficient documentation

## 2013-03-08 DIAGNOSIS — I428 Other cardiomyopathies: Secondary | ICD-10-CM

## 2013-03-08 DIAGNOSIS — R0602 Shortness of breath: Secondary | ICD-10-CM

## 2013-03-08 DIAGNOSIS — I319 Disease of pericardium, unspecified: Secondary | ICD-10-CM

## 2013-03-08 DIAGNOSIS — I429 Cardiomyopathy, unspecified: Secondary | ICD-10-CM

## 2013-03-08 DIAGNOSIS — I480 Paroxysmal atrial fibrillation: Secondary | ICD-10-CM

## 2013-03-08 DIAGNOSIS — G4730 Sleep apnea, unspecified: Secondary | ICD-10-CM

## 2013-03-08 DIAGNOSIS — I4891 Unspecified atrial fibrillation: Secondary | ICD-10-CM

## 2013-03-08 DIAGNOSIS — I313 Pericardial effusion (noninflammatory): Secondary | ICD-10-CM | POA: Insufficient documentation

## 2013-03-08 DIAGNOSIS — Z7901 Long term (current) use of anticoagulants: Secondary | ICD-10-CM

## 2013-03-08 MED ORDER — FUROSEMIDE 40 MG PO TABS: 80.0000 mg | ORAL_TABLET | Freq: Every day | ORAL | Status: DC

## 2013-03-08 MED ORDER — POTASSIUM CHLORIDE CRYS ER 20 MEQ PO TBCR: 30.0000 meq | EXTENDED_RELEASE_TABLET | Freq: Every day | ORAL | Status: DC

## 2013-03-08 NOTE — Assessment & Plan Note (Addendum)
SOB continues despite increase of lasix.  He still cannot lie flat to sleep.  Sits in a recliner.  Echo with normal EF, some LVH and elevated PA pressure to 35.  + pericardial effusion. Somewhat larger than last echo in 2012.  Will increase lasix to 80 mg in AM and 40 in pm and increase K+ to 30 meq daily.  Recheck labs next week and do CT of chest to further eval effusion.   He does not have to stop with ambulation for SOB he can continue to do what he needs to do.   Discussed again with Dr. Tresa Endo and we will proceed with cardiac CT instead of regular CT.  Office will call tomorrow and set up.  Dr. Tresa Endo would like him to have the CT this week.

## 2013-03-08 NOTE — Progress Notes (Signed)
03/08/2013   PCP: Beverley Fiedler, MD   Chief Complaint  Patient presents with  . Follow-up    2 week visit per Dr. Johney Frame; sob started about 6 weeks ago    Primary Cardiologist: Dr. Tresa Endo  HPI:  67 year old WM presents back today for echo results and eval of lower ext edema. He recently was cardioverted 02/08/13 for atrial fib.  On presentation he had lower ext edema and had trouble lying down to sleep due to SOB.  He was started on Lasix 40 mg daily.  Post DCCV with follow up with Dr. Johney Frame he continued with SOB.  Lasix was increased to 40mg  BID.  Echo was ordered.  But he was also maintaining SR.  Today he continues with SOB more at night, we has to sleep in a recliner to sleep and has trouble tolerating the bipap at all.  He can ambulate though he does become SOB, but does not have to stop what he is doing.  He does complain of mucus in his throat when he lies down and that is also a problem he rarely takes antihistamine.  + cough when he lies down. No chest pain.    Echo with EF 60% with LVH and PA pressure 35%.  Pericardial effusion mildly increased.     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  Then in 2009 another drop in EF due to a fib storm.  But since 2011 EF 55-60%.   After one of his ablations he did have pericardial effusion and it did not need to be tapped.  Possibility could be pericardial constriction due to chronic effusion.    Weight initially down from 219 to 214, but now back up to 216.       Allergies  Allergen Reactions  . Tetracyclines & Related Hives    Current Outpatient Prescriptions  Medication Sig Dispense Refill  . atorvastatin (LIPITOR) 40 MG tablet TAKE 1 TABLET BY MOUTH AT BEDTIME  90 tablet  3  . dabigatran (PRADAXA) 150 MG CAPS Take 150 mg by mouth every 12 (twelve) hours.        . finasteride (PROPECIA) 1 MG tablet Take 1 mg by mouth every morning.       . flecainide (TAMBOCOR) 100 MG tablet  TAKE 1 TABLET BY MOUTH TWICE A DAY  180 tablet  3  . furosemide (LASIX) 40 MG tablet Take 2 tablets (80 mg total) by mouth daily. Take another 40 mg in the evening  75 tablet  6  . LORazepam (ATIVAN) 0.5 MG tablet 0.5 mg as needed.      . metoprolol succinate (TOPROL-XL) 100 MG 24 hr tablet Take 1 tablet (100 mg total) by mouth daily. Take with or immediately following a meal.  90 tablet  4  . saw palmetto 160 MG capsule Take 450 mg by mouth 2 (two) times daily.       . Vitamin D, Ergocalciferol, (DRISDOL) 50000 UNITS CAPS Take 2,000 Units by mouth every 7 (seven) days. Takes on Sunday      . potassium chloride SA (K-DUR,KLOR-CON) 20 MEQ tablet Take 1.5 tablets (30 mEq total) by mouth daily.  45 tablet  6   No current facility-administered medications for this visit.    Past Medical History  Diagnosis Date  . Paroxysmal atrial fibrillation     s/p afib 2001 and 2009 by Dr Delena Serve  . Coronary artery disease  no significant CAD by cath 5/12  . Cardiomyopathy     hx of tachycardia mediated CM, now resolved  . Hypertension   . Hyperlipidemia   . Thoracic aortic aneurysm   . Depression   . Sleep apnea     AHI avg 5/hr  . Limb pain 06/20/2007    LE venous doppler - no evidence of thrombus or thrombophlebitis  . PVC (premature ventricular contraction) 12/18/2009    cardionet monitor 21 days - some PVCs, ventricular bigeminy  . Pericardial effusion   . Echocardiogram abnormal     EF60%, mild LVH, PA pk pressure 35, pericardial effusion mild increase from 2012     Past Surgical History  Procedure Laterality Date  . Cardiac catheterization  08/25/10    NO SIGNIFICANT CAD  . Cardioversion  10/18/2007    successful  . Elbow / upper arm foreign body removal    . Incision and drainage of wound  09/07/2011    Procedure: IRRIGATION AND DEBRIDEMENT WOUND;  Surgeon: Nestor Lewandowsky, MD;  Location: [facility] OR;  Service: Orthopedics;  Laterality: Left;  . Transesophageal echocardiogram  08/26/2010     see Notes tab  . Cardiovascular stress test  09/08/2005    R/S MV - EF 61%; Exercises capacity 12 Mets; essentially normal perfusion myocardial scan    . Cardiac electrophysiology mapping and ablation  [id number]    Dr. Ronn Melena  . Osteosynthesis Left 09/30/2012    with small plate in the ulna  . Cardioversion N/A 02/08/2013    Procedure: CARDIOVERSION;  Surgeon: Lennette Bihari, MD;  Location: [facility] ENDOSCOPY;  Service: Cardiovascular;  Laterality: N/A;    ZOX:WRUEAVW:UJ fevers, post nasal drip,  weight slowly increasing Skin:no rashes or ulcers HEENT:no blurred vision, mild episodic congestion CV:see HPI PUL:see HPI GI:no diarrhea, more frequent bowel movements with lasix, no constipation or melena, no indigestion GU:no hematuria, no dysuria MS:no joint pain, no claudication Neuro:no syncope, no lightheadedness Endo:no diabetes, no thyroid disease  PHYSICAL EXAM BP 120/80  Pulse 68  Ht 6\' 2"  (1.88 m)  Wt 216 lb (97.977 kg)  BMI 27.72 kg/m2 General:Pleasant affect, NAD Skin:Warm and dry, brisk capillary refill HEENT:normocephalic, sclera clear, mucus membranes moist Neck:supple, no JVD, no bruits  Heart:S1S2 RRR without murmur, gallup, rub or click Lungs:clear without rales, rhonchi, or wheezes WJX:BJYN, non tender, + BS, do not palpate liver spleen or masses Ext:+ lower ext edema to just below knees though improved from pre cardioversion, 2+ pedal pulses, 2+ radial pulses, + varocosities Neuro:alert and oriented, MAE, follows commands, + facial symmetry  EKG:SR no acute changes.  ASSESSMENT AND PLAN SOB (shortness of breath) SOB continues despite increase of lasix.  He still cannot lie flat to sleep.  Sits in a recliner.  Echo with normal EF, some LVH and elevated PA pressure to 35.  + pericardial effusion. Somewhat larger than last echo in 2012.  Will increase lasix to 80 mg in AM and 40 in pm and increase K+ to 30 meq daily.  Recheck labs next week and do CT of chest to  further eval effusion.   He does not have to stop with ambulation for SOB he can continue to do what he needs to do.     PAF (paroxysmal atrial fibrillation) After DCCV maintaining SR.  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  EF normalized and in 2009 EF 35-45%-thought to be low due to afib storms. Since then echoes in 2011, 2012, 2014  with EF 55-60%.    Pericardial effusion Mild in 2012 small, free flowing, pericardial effusion, small effusion in 2011 and now slightly larger.  CT in 2007 small pericardial effusion.  Unsure if this is causing the SOB.  Discussed with Dr. Tresa Endo will get CT of chest to eval.    Sleep apnea Recently evaluated., pt having difficulty wearing bipap due to SOB.   Chronic anticoagulation, pradaxa No hematuria no dysuria

## 2013-03-08 NOTE — Assessment & Plan Note (Signed)
After DCCV maintaining SR.

## 2013-03-08 NOTE — Assessment & Plan Note (Signed)
Recently evaluated., pt having difficulty wearing bipap due to SOB.

## 2013-03-08 NOTE — Patient Instructions (Addendum)
We will get a Cat Scan of your chest, then you will see Dr. Tresa Endo back for further plans.  In the meantime take 80 mg lasix in the morning and 40 in the evening.  Have lab work done next week.  increase your potassium to 1.5 tabs   daily .  Follow up with Dr. Tresa Endo in 1-2 weeks.

## 2013-03-08 NOTE — Telephone Encounter (Signed)
Rx was sent to pharmacy electronically. 

## 2013-03-08 NOTE — Assessment & Plan Note (Signed)
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  EF normalized and in 2009 EF 35-45%-thought to be low due to afib storms. Since then echoes in 2011, 2012, 2014 with EF 55-60%.

## 2013-03-08 NOTE — Assessment & Plan Note (Signed)
No hematuria no dysuria

## 2013-03-08 NOTE — Addendum Note (Signed)
Addended by: Leone Brand on: 03/08/2013 07:55 PM   Modules accepted: Orders

## 2013-03-08 NOTE — Assessment & Plan Note (Signed)
Mild in 2012 small, free flowing, pericardial effusion, small effusion in 2011 and now slightly larger.  CT in 2007 small pericardial effusion.  Unsure if this is causing the SOB.  Discussed with Dr. Tresa Endo will get CT of chest to eval.

## 2013-03-09 ENCOUNTER — Telehealth: Payer: Self-pay | Admitting: *Deleted

## 2013-03-09 ENCOUNTER — Encounter: Payer: Self-pay | Admitting: Cardiology

## 2013-03-09 ENCOUNTER — Other Ambulatory Visit: Payer: Self-pay | Admitting: *Deleted

## 2013-03-09 DIAGNOSIS — R0602 Shortness of breath: Secondary | ICD-10-CM

## 2013-03-09 MED ORDER — PANTOPRAZOLE SODIUM 40 MG PO TBEC: DELAYED_RELEASE_TABLET | ORAL | Status: DC

## 2013-03-09 NOTE — Telephone Encounter (Signed)
Returned call.  Left message to call back on home phone.  Message left on pt-identified cell that new Rx sent to pharmacy for reflux and to call back tomorrow before 4pm w/ questions/concerns.  Rx was sent to pharmacy.

## 2013-03-09 NOTE — Telephone Encounter (Signed)
So you ordered?  Could you also let pt know or ask JC to let him know to help prevent silent reflux.  Thanks.

## 2013-03-09 NOTE — Telephone Encounter (Signed)
Protonix not on med list.  Would you like an Rx sent to the pharmacy?  If so, do you want 20 mg or 40 mg.

## 2013-03-09 NOTE — Telephone Encounter (Signed)
Message copied by Chauncey Reading on Thu Mar 09, 2013  8:42 AM ------      Message from: Leone Brand      Created: Wed Mar 08, 2013 11:35 PM       Could you ask pt to take Protonix twice a day for a week then once daily, to see if he is having silent reflux that could be causing problems.  Try for 1 month until we can get CT scan done.  ------

## 2013-03-10 ENCOUNTER — Ambulatory Visit (HOSPITAL_COMMUNITY)
Admission: RE | Admit: 2013-03-10 | Discharge: 2013-03-10 | Disposition: A | Discharge status: Home / Self Care | Payer: Medicare Other | Source: Ambulatory Visit | Attending: Cardiology | Admitting: Cardiology

## 2013-03-10 DIAGNOSIS — R0602 Shortness of breath: Secondary | ICD-10-CM

## 2013-03-10 DIAGNOSIS — I712 Thoracic aortic aneurysm, without rupture, unspecified: Secondary | ICD-10-CM | POA: Insufficient documentation

## 2013-03-10 DIAGNOSIS — I319 Disease of pericardium, unspecified: Secondary | ICD-10-CM | POA: Insufficient documentation

## 2013-03-10 MED ORDER — IOHEXOL 350 MG/ML SOLN
80.0000 mL | Freq: Once | INTRAVENOUS | Status: AC | PRN
  Administered 2013-03-10: 100 mL via INTRAVENOUS

## 2013-03-10 NOTE — Telephone Encounter (Signed)
Returning your call from yesterday. °

## 2013-03-10 NOTE — Telephone Encounter (Signed)
I discussed with Mr. Helt the benefit of taking Protonix in an attempt to relieve some of his symptoms; pt. was not in total agreement with this, but did say he would consider taking this medication after his CT today. I also discussed silent reflux with Mr. Radziewicz and he stated an understanding of my explanation. Also pt wanted to know if he could get an earlier appt. with Dr. Tresa Endo. I discussed this with Baldomero Lamy in scheduling and Amy [facility] office manager .

## 2013-03-13 NOTE — Progress Notes (Signed)
Pt. Already had appt with Dr. Tresa Endo, and he was informed of his CXR results his cta.

## 2013-03-14 ENCOUNTER — Encounter: Payer: Self-pay | Admitting: Cardiovascular Disease

## 2013-03-14 ENCOUNTER — Other Ambulatory Visit: Payer: Medicare Other

## 2013-03-14 ENCOUNTER — Ambulatory Visit (INDEPENDENT_AMBULATORY_CARE_PROVIDER_SITE_OTHER): Payer: Medicare Other | Admitting: Cardiovascular Disease

## 2013-03-14 VITALS — BP 122/80 | HR 90 | Ht 74.0 in | Wt 220.7 lb

## 2013-03-14 DIAGNOSIS — R0602 Shortness of breath: Secondary | ICD-10-CM

## 2013-03-14 DIAGNOSIS — I313 Pericardial effusion (noninflammatory): Secondary | ICD-10-CM

## 2013-03-14 DIAGNOSIS — G4730 Sleep apnea, unspecified: Secondary | ICD-10-CM

## 2013-03-14 DIAGNOSIS — I3139 Other pericardial effusion (noninflammatory): Secondary | ICD-10-CM

## 2013-03-14 DIAGNOSIS — I319 Disease of pericardium, unspecified: Secondary | ICD-10-CM

## 2013-03-14 DIAGNOSIS — I428 Other cardiomyopathies: Secondary | ICD-10-CM

## 2013-03-14 DIAGNOSIS — I4891 Unspecified atrial fibrillation: Secondary | ICD-10-CM

## 2013-03-14 DIAGNOSIS — I480 Paroxysmal atrial fibrillation: Secondary | ICD-10-CM

## 2013-03-14 DIAGNOSIS — D689 Coagulation defect, unspecified: Secondary | ICD-10-CM

## 2013-03-14 DIAGNOSIS — I429 Cardiomyopathy, unspecified: Secondary | ICD-10-CM

## 2013-03-14 DIAGNOSIS — I10 Essential (primary) hypertension: Secondary | ICD-10-CM

## 2013-03-14 LAB — CBC
HCT: 41.7 % (ref 39.0–52.0)
Hemoglobin: 14 g/dL (ref 13.0–17.0)
MCH: 28.9 pg (ref 26.0–34.0)
MCHC: 33.6 g/dL (ref 30.0–36.0)
WBC: 8.4 10*3/uL (ref 4.0–10.5)

## 2013-03-14 LAB — COMPREHENSIVE METABOLIC PANEL
ALT: 24 U/L (ref 0–53)
AST: 26 U/L (ref 0–37)
Albumin: 4.5 g/dL (ref 3.5–5.2)
Alkaline Phosphatase: 71 U/L (ref 39–117)
BUN: 25 mg/dL — ABNORMAL HIGH (ref 6–23)
Creat: 1.07 mg/dL (ref 0.50–1.35)
Potassium: 3.8 mEq/L (ref 3.5–5.3)

## 2013-03-14 NOTE — Patient Instructions (Addendum)
Your physician has requested that you have a cardiac catheterization. Cardiac catheterization is used to diagnose and/or treat various heart conditions. Doctors may recommend this procedure for a number of different reasons. The most common reason is to evaluate chest pain. Chest pain can be a symptom of coronary artery disease (CAD), and cardiac catheterization can show whether plaque is narrowing or blocking your heart's arteries. This procedure is also used to evaluate the valves, as well as measure the blood flow and oxygen levels in different parts of your heart. For further information please visit [email or web address] Please follow instruction sheet, as given. This will be scheduled for Friday December 12th.  Your physician has recommended you make the following change in your medication: hold your Pradaxa starting today if youhave not already taken it.  Your physician recommends that you return for lab work today or tomorrow.  Your physician recommends that you schedule a follow-up appointment : you will be given follow up instructions at time of your hospital discharge.

## 2013-03-15 ENCOUNTER — Encounter (HOSPITAL_COMMUNITY): Payer: Self-pay | Admitting: Pharmacy Technician

## 2013-03-15 ENCOUNTER — Encounter: Payer: Self-pay | Admitting: Cardiovascular Disease

## 2013-03-15 ENCOUNTER — Other Ambulatory Visit: Payer: Self-pay | Admitting: *Deleted

## 2013-03-15 DIAGNOSIS — Z01818 Encounter for other preprocedural examination: Secondary | ICD-10-CM

## 2013-03-15 NOTE — Progress Notes (Signed)
Patient ID: [name] [name], male   DOB: March 22, 1946, 67 y.o.   MRN: [id number]     HPI: [name] [name] is a 67 y.o. male who presents to the office today with a chief complaint of increasing episodes of shortness of breath.  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 Patient also has a history of paroxysmal atrial fibrillation. He has undergone 2 previous ablation procedures by Dr. Ronn Melena in 2002 and in 2009. He has been documented to have a pericardial effusion since 2007. At that time, his pericardial effusion was felt to be small in 2012, the patient had a recurrent episode of atrial fibrillation and since that time has been on per Dr. anticoagulation in addition to flexion I 100 mg twice a day. He had been maintaining sinus rhythm until recently after his return from [place] where he spent the summer performing a production. Of note, while [place] he did fall and had surgery on his left hand. He has been bothered with recurrent discomfort and may need carpal tunnel surgery subsequently. He had developed recurrent atrial fibrillation again and underwent successful cardioversion by me on 02/08/2013. He's been maintaining sinus rhythm since that time.  [name] [name] has developed progressive shortness of breath. 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 Easily, he has been unable to lie flat in bed due to shortness of breath. He had seen Dr. Johney Frame and lower in the old subsequent to his cardioversion. A recent echo Doppler study on 03/06/2013 now showed a moderate per cardial effusion. There was mild LVH with normal systolic function. His left atrium was mildly dilated and his right atrium was upper normal in size. When last seen by Delfin Gant in our office one week ago, his diuretic regimen was  further increased to Lasix 80 mg in the morning and 40 mg in the afternoon. He states this is intermittently worked in some instances has felt improved with reference to shortness of breath but he still notes swelling., At times he feels that his shortness of breath is becoming more progressive. He subsequently underwent a CT scan of his chest area he previously was noted to have a mild per sec aortic aneurysm. His descending thoracic aorta on this most recent study measured 4.0 cm in maximum transverse diameter. He was noted to have a moderate-sized pericardial effusion which is clearly increased from 2007. There is no evidence for pulmonary emboli. He also had small probable cysts in the liver and is a small hyperenhancing liver mass.  [name] is now for evaluation. He states today in the office that he is now getting scared and that he is unable to lie flat. He feels as though his breath is being cut off. He is still having issues with swelling. I also have had discussion with his brothers sister-in-law who has been a former Publishing rights manager in our office. This concern and discussion has been raised about potential chronic pericardial effusion leading to constrictive symptomatology which may be contributing to his continued dyspnea. He now presents for evaluation  Past Medical History  Diagnosis Date  . Paroxysmal atrial fibrillation     s/p afib 2001 and 2009 by Dr Delena Serve  . Coronary artery disease     no significant CAD by cath 5/12  . Cardiomyopathy  hx of tachycardia mediated CM, now resolved  . Hypertension   . Hyperlipidemia   . Thoracic aortic aneurysm   . Depression   . Sleep apnea     AHI avg 5/hr  . Limb pain 06/20/2007    LE venous doppler - no evidence of thrombus or thrombophlebitis  . PVC (premature ventricular contraction) 12/18/2009    cardionet monitor 21 days - some PVCs, ventricular bigeminy  . Pericardial effusion   . Echocardiogram abnormal     EF60%, mild LVH,  PA pk pressure 35, pericardial effusion mild increase from 2012     Past Surgical History  Procedure Laterality Date  . Cardiac catheterization  08/25/10    NO SIGNIFICANT CAD  . Cardioversion  10/18/2007    successful  . Elbow / upper arm foreign body removal    . Incision and drainage of wound  09/07/2011    Procedure: IRRIGATION AND DEBRIDEMENT WOUND;  Surgeon: Nestor Lewandowsky, MD;  Location: [facility] OR;  Service: Orthopedics;  Laterality: Left;  . Transesophageal echocardiogram  08/26/2010    see Notes tab  . Cardiovascular stress test  09/08/2005    R/S MV - EF 61%; Exercises capacity 12 Mets; essentially normal perfusion myocardial scan    . Cardiac electrophysiology mapping and ablation  [id number]    Dr. Ronn Melena  . Osteosynthesis Left 09/30/2012    with small plate in the ulna  . Cardioversion N/A 02/08/2013    Procedure: CARDIOVERSION;  Surgeon: Lennette Bihari, MD;  Location: [facility] ENDOSCOPY;  Service: Cardiovascular;  Laterality: N/A;    Allergies  Allergen Reactions  . Tetracyclines & Related Hives    Current Outpatient Prescriptions  Medication Sig Dispense Refill  . dabigatran (PRADAXA) 150 MG CAPS Take 150 mg by mouth every 12 (twelve) hours.        . finasteride (PROPECIA) 1 MG tablet Take 1 mg by mouth every morning.       . furosemide (LASIX) 40 MG tablet Take 2 tablets (80 mg total) by mouth daily. Take another 40 mg in the evening  75 tablet  6  . LORazepam (ATIVAN) 0.5 MG tablet Take 0.5 mg by mouth at bedtime.       . metoprolol succinate (TOPROL-XL) 100 MG 24 hr tablet Take 1 tablet (100 mg total) by mouth daily. Take with or immediately following a meal.  90 tablet  4  . pantoprazole (PROTONIX) 40 MG tablet Take one tab twice daily for one week, then once daily for silent reflux  40 tablet  1  . potassium chloride SA (K-DUR,KLOR-CON) 20 MEQ tablet Take 1.5 tablets (30 mEq total) by mouth daily.  45 tablet  6  . atorvastatin (LIPITOR) 40 MG tablet Take 40 mg by mouth  daily.      . Cholecalciferol (VITAMIN D) 2000 UNITS CAPS Take 2,000 Units by mouth daily.      . flecainide (TAMBOCOR) 100 MG tablet Take 100 mg by mouth 2 (two) times daily.      . Saw Palmetto, Serenoa repens, (SAW PALMETTO PO) Take 1 tablet by mouth 2 (two) times daily.       No current facility-administered medications for this visit.    History   Social History  . Marital Status: Single    Spouse Name: N/A    Number of Children: 0  . Years of Education: N/A   Occupational History  .  [employer]   Social History Main Topics  . Smoking status: Former  Smoker    Quit date: 02/06/1989  . Smokeless tobacco: Not on file  . Alcohol Use: Yes     Comment: 2 bottles of wine per week  . Drug Use: No  . Sexual Activity: Not on file   Other Topics Concern  . 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  He is single and has no children.    Family History  Problem Relation Age of Onset  . Coronary artery disease Father   . Stroke Mother     died  . Atrial fibrillation Brother   . Heart attack Father   . Mitral valve prolapse Sister     ROS is negative for fevers, chills or night sweats. He denies rash. He denies visual changes. He denies change in hearing. He is unaware of lymphadenopathy. He notes significant shortness of breath with activity as well as lying flat. He's now been sleeping in a recliner. He feels he cannot use his BiPAP therapy presently due to the shortness of breath. A previous cardiac catheterization had shown mild luminal irregularities of his coronary anatomy. He denies any prolonged episode of chest pressure. He denies nausea vomiting or diarrhea. He denies blood in stool or urine. He continues to have difficulty with his left hand which is now still bandage following his surgery done [place] he may require carpal tunnel surgery. He continues to note leg swelling bilaterally left leg greater than right. He denies tremors. There is no diabetes   Other comprehensive 12 point system review is negative.  PE BP 122/80  Pulse 90  Ht 6\' 2"  (1.88 m)  Wt 220 lb 11.2 oz (100.109 kg)  BMI 28.32 kg/m2  No obvious pulsus paradoxicus General: Alert, oriented, no distress.  Skin: normal turgor, no rashes HEENT: Normocephalic, atraumatic. Pupils round and reactive; sclera anicteric;no lid lag.  Nose without nasal septal hypertrophy Mouth/Parynx benign; Mallinpatti scale 3 Neck: No JVD, no carotid briuts; no hepatojugular reflux jugular reflux Lungs: clear to ausculatation and percussion; no wheezing or rales Heart: RRR, s1 s2 normal soft friction rub, 1/6 systolic murmur at the apex. Abdomen: soft, nontender; no hepatosplenomehaly, BS+; abdominal aorta nontender and not dilated by palpation. Pulses 2+ Extremities: no clubbing cyanosis or edema, Homan's sign negative  Neurologic: grossly nonfocal Psychologic: normal affect, but obviously becoming scared and anxious about his worsening dyspnea  ECG: Sinus rhythm at 90 beats per minute. Poor anterior R-wave progression V1 through V4 with QRS complex. Mild left axis deviation.  LABS:  BMET    Component Value Date/Time   NA 142 03/14/2013 1617   K 3.8 03/14/2013 1617   CL 102 03/14/2013 1617   CO2 30 03/14/2013 1617   GLUCOSE 103* 03/14/2013 1617   BUN 25* 03/14/2013 1617   CREATININE 1.07 03/14/2013 1617   CREATININE 0.9 02/22/2013 1232   CALCIUM 9.5 03/14/2013 1617   GFRNONAA 88* 09/07/2011 0528   GFRAA >90 09/07/2011 0528     Hepatic Function Panel     Component Value Date/Time   PROT 6.9 03/14/2013 1617   ALBUMIN 4.5 03/14/2013 1617   AST 26 03/14/2013 1617   ALT 24 03/14/2013 1617   ALKPHOS 71 03/14/2013 1617   BILITOT 0.5 03/14/2013 1617     CBC    Component Value Date/Time   WBC  8.4 03/14/2013 1619   RBC 4.85  03/14/2013 1619   HGB 14.0 03/14/2013 1619   HCT 41.7 03/14/2013 1619   PLT 239 03/14/2013 1619   MCV 86.0 03/14/2013 1619   MCH 28.9 03/14/2013 1619   MCHC 33.6 03/14/2013 1619   RDW 13.8 03/14/2013 1619   LYMPHSABS 2.3 02/06/2013 1355   MONOABS 0.8 02/06/2013 1355   EOSABS 0.2 02/06/2013 1355   BASOSABS 0.0 02/06/2013 1355     BNP    Component Value Date/Time   PROBNP 58.97 03/14/2013 1615    Lipid Panel     Component Value Date/Time   CHOL  Value: 188        ATP III CLASSIFICATION:  <200     mg/dL   Desirable  409-811  mg/dL   Borderline High  >=914    mg/dL   High 7/82/9562 1308   TRIG 63 08/23/2007 0910   HDL 47 08/23/2007 0910   CHOLHDL 4.0 08/23/2007 0910   VLDL 13 08/23/2007 0910   LDLCALC  Value: 128        Total Cholesterol/HDL:CHD Risk Coronary Heart Disease Risk Table                     Men   Women  1/2 Average Risk   3.4   3.3* 08/23/2007 0910     RADIOLOGY: Dg Chest 2 View  03/10/2013   CLINICAL DATA:  Short of breath  EXAM: CHEST  2 VIEW  COMPARISON:  08/22/2010  FINDINGS: Mild cardiac enlargement. Negative for heart failure. Lungs are clear without infiltrate effusion or mass.  IMPRESSION: No active cardiopulmonary disease.   Electronically Signed   By: Marlan Palau M.D.   On: 03/10/2013 15:53   Ct Angio Chest W/cm &/or Wo Cm  03/10/2013   CLINICAL DATA:  Shortness of breath. Ascending thoracic aortic aneurysm. Pericardial effusion.  EXAM: CT ANGIOGRAPHY CHEST WITH CONTRAST  TECHNIQUE: Multidetector CT imaging of the chest was performed using the standard protocol during bolus administration of intravenous contrast. Multiplanar CT image reconstructions including MIPs were obtained to evaluate the vascular anatomy.  CONTRAST:  OMNIPAQUE IOHEXOL 350 MG/ML SOLN  COMPARISON:  Chest radiographs obtained earlier today and chest CT dated 09/17/2005.  FINDINGS: The ascending thoracic aorta remains borderline enlarged, measuring 4.0 cm in maximum transverse diameter on image  number 158 of series 5. Moderate sized pericardial effusion measuring 17.5 mm in thickness on image number 79 of series 4. Normally opacified pulmonary arteries. No pulmonary arterial filling defects are seen. Clear lungs. No lung nodules or enlarged lymph nodes. 8 mm probable cyst in the superior aspect of the lateral segment of the left lobe of the liver on image number 85 and 11 mm probable cyst in the lateral segment of the left lobe of the liver on image number 93, both mildly increased in size since the previous examination. There is also a 5 mm rounded hyper enhancing mass in the anterior aspect of the lateral segment of the left lobe of the liver on image number 89, not definitely seen previously. Thoracic spine degenerative changes.  Review of the MIP images confirms the above findings.  IMPRESSION: 1. Moderate-sized pericardial effusion, increased in size. 2. Stable borderline aneurysmal dilatation of the ascending thoracic aorta. 3. No pulmonary emboli. 4. Small probable cysts in the liver and small hyper enhancing liver mass, as described above.   Electronically Signed   By: Gordan Payment M.D.   On: 03/10/2013 15:50  ASSESSMENT AND PLAN:  I had a long discussion with Mr. Blessinger. 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 His ejection fraction susceptibly improved. He is status post prior atrial fibrillation  ablations with his last being done in 2009. He's been documented to have a pericardial effusion since 2007. He recently had developed recurrent atrial fibrillation which he felt was contributing to his shortness of breath. He underwent successful cardioversion over one month ago and has been maintaining sinus rhythm since. However, he has not noticed any significant improvement in his dyspnea and shortness of breath and in fact he feels that this is progressively becoming worse. He is now becoming very anxious. His recent  CT scan did show progressive increase in the pericardial effusion size which is now characterized as moderate and measured 1.74 cm on CT imaging. He did have mild luminal irregularities a prior cardiac catheterization. His ECG does show poor progression anteriorly. After much discussion and also after discussing him at length with his brothers sister-in-law who is a former Publishing rights manager for our office, Ms. Lezlie Octave, I have recommended a right and left heart catheterization be undertaken for further evaluation.. This will be helpful to make certain he does not have restrictive or constrictive pericardial findings on right heart hemodynamic tracings.  He will remain off his anticoagulation Pradaxa for the remainder of the week with his last dose take being this morning 03/14/2013. I will schedule him for this procedure to be done on Friday, 03/17/2013 at [facility]. I discussed with him that we may ultimately refer him to cardiac surgery for consideration of possible subxiphoid pericardial window for his chronic pericardial effusion.     Lennette Bihari, MD, [facility]  03/15/2013 6:30 PM

## 2013-03-17 ENCOUNTER — Encounter (HOSPITAL_COMMUNITY)
Admission: RE | Disposition: A | Discharge status: Home / Self Care | Payer: Self-pay | Source: Ambulatory Visit | Attending: Cardiovascular Disease

## 2013-03-17 ENCOUNTER — Ambulatory Visit (HOSPITAL_COMMUNITY)
Admission: RE | Admit: 2013-03-17 | Discharge: 2013-03-17 | Disposition: A | Discharge status: Home / Self Care | Payer: Medicare Other | Source: Ambulatory Visit | Attending: Cardiovascular Disease | Admitting: Cardiovascular Disease

## 2013-03-17 DIAGNOSIS — I480 Paroxysmal atrial fibrillation: Secondary | ICD-10-CM

## 2013-03-17 DIAGNOSIS — I313 Pericardial effusion (noninflammatory): Secondary | ICD-10-CM

## 2013-03-17 DIAGNOSIS — Z7901 Long term (current) use of anticoagulants: Secondary | ICD-10-CM | POA: Insufficient documentation

## 2013-03-17 DIAGNOSIS — G4730 Sleep apnea, unspecified: Secondary | ICD-10-CM

## 2013-03-17 DIAGNOSIS — R0602 Shortness of breath: Secondary | ICD-10-CM

## 2013-03-17 DIAGNOSIS — I3139 Other pericardial effusion (noninflammatory): Secondary | ICD-10-CM

## 2013-03-17 DIAGNOSIS — Z01818 Encounter for other preprocedural examination: Secondary | ICD-10-CM

## 2013-03-17 DIAGNOSIS — I4891 Unspecified atrial fibrillation: Secondary | ICD-10-CM | POA: Insufficient documentation

## 2013-03-17 DIAGNOSIS — I319 Disease of pericardium, unspecified: Secondary | ICD-10-CM | POA: Insufficient documentation

## 2013-03-17 HISTORY — PX: LEFT AND RIGHT HEART CATHETERIZATION WITH CORONARY ANGIOGRAM: SHX5449

## 2013-03-17 LAB — POCT I-STAT 3, VENOUS BLOOD GAS (G3P V)
Acid-Base Excess: 4 mmol/L — ABNORMAL HIGH (ref 0.0–2.0)
Bicarbonate: 29.7 mEq/L — ABNORMAL HIGH (ref 20.0–24.0)
O2 Saturation: 69 %
TCO2: 31 mmol/L (ref 0–100)

## 2013-03-17 LAB — POCT I-STAT 3, ART BLOOD GAS (G3+)
O2 Saturation: 95 %
TCO2: 30 mmol/L (ref 0–100)
pCO2 arterial: 43.9 mmHg (ref 35.0–45.0)
pO2, Arterial: 75 mmHg — ABNORMAL LOW (ref 80.0–100.0)

## 2013-03-17 LAB — PROTIME-INR: Prothrombin Time: 12 seconds (ref 11.6–15.2)

## 2013-03-17 SURGERY — LEFT AND RIGHT HEART CATHETERIZATION WITH CORONARY ANGIOGRAM
Anesthesia: LOCAL

## 2013-03-17 MED ORDER — HEPARIN (PORCINE) IN NACL 2-0.9 UNIT/ML-% IJ SOLN
INTRAMUSCULAR | Status: AC
  Filled 2013-03-17: qty 1000

## 2013-03-17 MED ORDER — ONDANSETRON HCL 4 MG/2ML IJ SOLN: 4.0000 mg | Freq: Four times a day (QID) | INTRAMUSCULAR | Status: DC | PRN

## 2013-03-17 MED ORDER — DEXTROSE-NACL 5-0.45 % IV SOLN
INTRAVENOUS | Status: DC
  Administered 2013-03-17: 09:00:00 via INTRAVENOUS

## 2013-03-17 MED ORDER — ASPIRIN 81 MG PO CHEW
81.0000 mg | CHEWABLE_TABLET | ORAL | Status: AC
  Administered 2013-03-17: 81 mg via ORAL

## 2013-03-17 MED ORDER — ASPIRIN 81 MG PO CHEW
CHEWABLE_TABLET | ORAL | Status: AC
  Administered 2013-03-17: 81 mg via ORAL
  Filled 2013-03-17: qty 1

## 2013-03-17 MED ORDER — SODIUM CHLORIDE 0.9 % IJ SOLN: 3.0000 mL | INTRAMUSCULAR | Status: DC | PRN

## 2013-03-17 MED ORDER — SODIUM CHLORIDE 0.9 % IV SOLN: 250.0000 mL | INTRAVENOUS | Status: DC | PRN

## 2013-03-17 MED ORDER — MIDAZOLAM HCL 2 MG/2ML IJ SOLN
INTRAMUSCULAR | Status: AC
  Filled 2013-03-17: qty 2

## 2013-03-17 MED ORDER — DIAZEPAM 5 MG PO TABS
ORAL_TABLET | ORAL | Status: AC
  Administered 2013-03-17: 5 mg via ORAL
  Filled 2013-03-17: qty 1

## 2013-03-17 MED ORDER — SODIUM CHLORIDE 0.9 % IJ SOLN: 3.0000 mL | Freq: Two times a day (BID) | INTRAMUSCULAR | Status: DC

## 2013-03-17 MED ORDER — LIDOCAINE HCL (PF) 1 % IJ SOLN
INTRAMUSCULAR | Status: AC
  Filled 2013-03-17: qty 30

## 2013-03-17 MED ORDER — DIAZEPAM 5 MG PO TABS
5.0000 mg | ORAL_TABLET | ORAL | Status: AC
  Administered 2013-03-17: 5 mg via ORAL

## 2013-03-17 MED ORDER — ACETAMINOPHEN 325 MG PO TABS: 650.0000 mg | ORAL_TABLET | ORAL | Status: DC | PRN

## 2013-03-17 MED ORDER — SODIUM CHLORIDE 0.9 % IV SOLN: INTRAVENOUS | Status: DC

## 2013-03-17 MED ORDER — FENTANYL CITRATE 0.05 MG/ML IJ SOLN
INTRAMUSCULAR | Status: AC
  Filled 2013-03-17: qty 2

## 2013-03-17 NOTE — Interval H&P Note (Signed)
History and Physical Interval Note:  03/17/2013 8:16 AM  Wyn Quaker  has presented today for right and left heart catheterization, with the diagnosis of chronic PERICARDIAL EFFUSION and increasing dyspnea to evaluate for possible constrictive pericarditis. The various methods of treatment have been discussed with the patient and family. After consideration of risks, benefits and other options for treatment, the patient has consented to  Procedure(s): LEFT AND RIGHT HEART CATHETERIZATION WITH CORONARY ANGIOGRAM (N/A) as a surgical intervention .  The patient's history has been reviewed, patient examined, no change in status, stable for surgery.  I have reviewed the patient's chart and labs.  Questions were answered to the patient's satisfaction.     KELLY,THOMAS A

## 2013-03-17 NOTE — CV Procedure (Signed)
[name] [name] is a 67 y.o. male    [id number]  [id number] LOCATION:  FACILITY: [facility]  PHYSICIAN: Lennette Bihari, MD, [facility] 1945-11-02   DATE OF PROCEDURE:  03/17/2013     CARDIAC CATHETERIZATION     HISTORY:  [name] is a 67 year old male with remote history of viral  cardiomyopathy in 1992 at which time his ejection fraction was 20% which  ultimately normalized. The patient is status post 2 previous atrial fibrillation ablations done by Dr. Bryan Lemma in 2002 and more recently in 2009. Since 2007, he has been documented to have at least a small pericardial effusion. In 2005, he developed recurrent atrial fibrillation and has been on flecainide since that time. A cardiac catheterization revealed mild luminal irregularities. Recently, the patient developed recurrent atrial fibrillation upon his return from [place]. He developed increasing episodes of shortness of breath. Following successful DC cardioversion back into sinus rhythm he continued to experience issues with dyspnea. He admits to PND and orthopnea. His diuretic dose has been increased. A subsequent echo Doppler study now demonstrated a moderate pericardial effusion. A subsequent CT scan confirmed a moderate pericardial effusion at approximately 1.75 cm at maximal dimension. He also has been found to have upper normal to grossly dilated descending aorta. Because of continued symptoms of shortness of breath he is now brought to the catheterization laboratory to assess for constrictive pericarditis in light of his chronic pericardial effusion. He has been off his pradaxa for 3 days.    PROCEDURE:  The patient was brought to the second floor [place] [facility] cath lab in the postabsorptive state. His right femoral artery and right femoral vein were punctured and a 7 French venous sheath and 5 French arterial sheath were inserted without difficulty. A Swan-Ganz catheter was advanced to the right atrium, and  care for pressure monitoring was performed with fasting speed evaluate  waveform contour. A 5 French pigtail catheter was inserted into the right femoral artery sheath and advanced to the central aorta. The catheter was then advanced into the right ventricle and right ventricular pressure was recorded. The pigtail catheter was advanced into the left ventricle and anus left ventricular and right ventricular pressures were recorded. At this time, the patient did appear to be dry and had inadvertently taking his morning dose of Lasix. Lahey was treated with fluid bolus. He catheter was then advanced to the pulmonary artery oxygen saturation was obtained. Poor capillary wedge pressure was then recorded as was simultaneous LV and PC pressures. The Ernestine Conrad was then advanced back into the pulmonary artery and cardiac output measurements were obtained which gave additional 60 cc fluid bolus. The Swan catheter was then pulled back into the right ventricle and hemodynamic simultaneous recording was again made of the left ventricular and right ventricular pressures. Where as initially PE BP was very low and equal following fluid challenge the LVEDP was now greater than the RVEDP arguing against constrictive hemodynamics. Completion of the right heart pullback was then performed. Left ventriculography was done with 25 cc of contrast at 12 cc per second. An LV to AO pullback was performed. Attention was then directed at the coronary arteries and diagnostic catheterization was done utilizing 5 French Judkins 4 left and right artery catheters. No stasis bite was obtained by direct manual pressure. The patient tolerated the procedure well.  HEMODYNAMICS:   Right Atrium: Mean pressure 6. There was no evidence for a rapid Y descent in early diastole arguing against constrictive  hemodynamics. Right ventricle: 27/4 , post a wave 7 Simultaneous Left ventricular pressure 110/5 post a wave 11 PA pressure 28/10, mean 18, capillary  wedge pressure mean 10   Following a fluid challenge: Left ventricular pressure 106/11/14 Right ventricular pressure: 32/2/7 Right atrial pressure: 7/5/5  No evidence for pulsus paradoxius or  cardiac tamponade hemodynamics.    Pullback pressure: LV 120/15                                  Ao: 120/65  Oxygen saturation: Pulmonary artery 69%; central aorta 95%  Cardiac output: Fick method 6.1; thermodilution method 3.7 l/min Cardiac index:                        2.7                                       1.6 l/min/m2   ANGIOGRAPHY:  1. Left main: Angiographically normal vessel 2. LAD: Minimal proximal luminal irregularity but otherwise angiographically normal vessel which gave rise to 2 major diagonal vessels and septal perforating artery. 3. Left circumflex: Angiographically normal vessel 4. Right coronary artery: Angiographically normal dominant right coronary artery   Left ventriculography revealed global ejection fraction of 50-55%. There was normal to minimal aortic root dilatation. There were no focal segmental wall motion abnormalities.    IMPRESSION:  No significant coronary obstructive disease with essentially normal coronary arteries only minimal luminal irregularity of the proximal LAD.  Normal to low normal global LV function without focal segmental wall motion abnormalities with an ejection fraction of approximately 50-55%.  No hemodynamic evidence for constrictive pericarditis with low RVEDP pressure, no evidence for early diastolic rapid Y descent, and following fluid challenge, no evidence for equalization of LVEDP and RVEDP.   No hemodynamic evidence for cardiac tamponade.    Lennette Bihari, MD, [facility] 03/17/2013 4:31 PM

## 2013-03-17 NOTE — H&P (View-Only) (Signed)
Patient ID: [name] [name], male   DOB: 01/12/1946, 67 y.o.   MRN: [id number]     HPI: [name] [name] is a 67 y.o. male who presents to the office today with a chief complaint of increasing episodes of shortness of breath.  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 Patient also has a history of paroxysmal atrial fibrillation. He has undergone 2 previous ablation procedures by Dr. Marcus Wharton in 2002 and in 2009. He has been documented to have a pericardial effusion since 2007. At that time, his pericardial effusion was felt to be small in 2012, the patient had a recurrent episode of atrial fibrillation and since that time has been on per Dr. anticoagulation in addition to flexion I 100 mg twice a day. He had been maintaining sinus rhythm until recently after his return from [place] where he spent the summer performing a production. Of note, while [place] he did fall and had surgery on his left hand. He has been bothered with recurrent discomfort and may need carpal tunnel surgery subsequently. He had developed recurrent atrial fibrillation again and underwent successful cardioversion by me on 02/08/2013. He's been maintaining sinus rhythm since that time.  [name] [name] has developed progressive shortness of breath. 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 Easily, he has been unable to lie flat in bed due to shortness of breath. He had seen Dr. Allred and lower in the old subsequent to his cardioversion. A recent echo Doppler study on 03/06/2013 now showed a moderate per cardial effusion. There was mild LVH with normal systolic function. His left atrium was mildly dilated and his right atrium was upper normal in size. When last seen by Laura Ingle in our office one week ago, his diuretic regimen was  further increased to Lasix 80 mg in the morning and 40 mg in the afternoon. He states this is intermittently worked in some instances has felt improved with reference to shortness of breath but he still notes swelling., At times he feels that his shortness of breath is becoming more progressive. He subsequently underwent a CT scan of his chest area he previously was noted to have a mild per sec aortic aneurysm. His descending thoracic aorta on this most recent study measured 4.0 cm in maximum transverse diameter. He was noted to have a moderate-sized pericardial effusion which is clearly increased from 2007. There is no evidence for pulmonary emboli. He also had small probable cysts in the liver and is a small hyperenhancing liver mass.  [name] [name] is now for evaluation. He states today in the office that he is now getting scared and that he is unable to lie flat. He feels as though his breath is being cut off. He is still having issues with swelling. I also have had discussion with his brothers sister-in-law who has been a former nurse practitioner in our office. This concern and discussion has been raised about potential chronic pericardial effusion leading to constrictive symptomatology which may be contributing to his continued dyspnea. He now presents for evaluation  Past Medical History  Diagnosis Date  . Paroxysmal atrial fibrillation     s/p afib 2001 and 2009 by Dr Wharton  . Coronary artery disease     no significant CAD by cath 5/12  . Cardiomyopathy       hx of tachycardia mediated CM, now resolved  . Hypertension   . Hyperlipidemia   . Thoracic aortic aneurysm   . Depression   . Sleep apnea     AHI avg 5/hr  . Limb pain 06/20/2007    LE venous doppler - no evidence of thrombus or thrombophlebitis  . PVC (premature ventricular contraction) 12/18/2009    cardionet monitor 21 days - some PVCs, ventricular bigeminy  . Pericardial effusion   . Echocardiogram abnormal     EF60%, mild LVH,  PA pk pressure 35, pericardial effusion mild increase from 2012     Past Surgical History  Procedure Laterality Date  . Cardiac catheterization  08/25/10    NO SIGNIFICANT CAD  . Cardioversion  10/18/2007    successful  . Elbow / upper arm foreign body removal    . Incision and drainage of wound  09/07/2011    Procedure: IRRIGATION AND DEBRIDEMENT WOUND;  Surgeon: Frank J Rowan, MD;  Location: [facility] OR;  Service: Orthopedics;  Laterality: Left;  . Transesophageal echocardiogram  08/26/2010    see Notes tab  . Cardiovascular stress test  09/08/2005    R/S MV - EF 61%; Exercises capacity 12 Mets; essentially normal perfusion myocardial scan    . Cardiac electrophysiology mapping and ablation  2001,2009    Dr. Marcus Wharton  . Osteosynthesis Left 09/30/2012    with small plate in the ulna  . Cardioversion N/A 02/08/2013    Procedure: CARDIOVERSION;  Surgeon: Kawan Valladolid A Jolanda Mccann, MD;  Location: [facility] ENDOSCOPY;  Service: Cardiovascular;  Laterality: N/A;    Allergies  Allergen Reactions  . Tetracyclines & Related Hives    Current Outpatient Prescriptions  Medication Sig Dispense Refill  . dabigatran (PRADAXA) 150 MG CAPS Take 150 mg by mouth every 12 (twelve) hours.        . finasteride (PROPECIA) 1 MG tablet Take 1 mg by mouth every morning.       . furosemide (LASIX) 40 MG tablet Take 2 tablets (80 mg total) by mouth daily. Take another 40 mg in the evening  75 tablet  6  . LORazepam (ATIVAN) 0.5 MG tablet Take 0.5 mg by mouth at bedtime.       . metoprolol succinate (TOPROL-XL) 100 MG 24 hr tablet Take 1 tablet (100 mg total) by mouth daily. Take with or immediately following a meal.  90 tablet  4  . pantoprazole (PROTONIX) 40 MG tablet Take one tab twice daily for one week, then once daily for silent reflux  40 tablet  1  . potassium chloride SA (K-DUR,KLOR-CON) 20 MEQ tablet Take 1.5 tablets (30 mEq total) by mouth daily.  45 tablet  6  . atorvastatin (LIPITOR) 40 MG tablet Take 40 mg by mouth  daily.      . Cholecalciferol (VITAMIN D) 2000 UNITS CAPS Take 2,000 Units by mouth daily.      . flecainide (TAMBOCOR) 100 MG tablet Take 100 mg by mouth 2 (two) times daily.      . Saw Palmetto, Serenoa repens, (SAW PALMETTO PO) Take 1 tablet by mouth 2 (two) times daily.       No current facility-administered medications for this visit.    History   Social History  . Marital Status: Single    Spouse Name: N/A    Number of Children: 0  . Years of Education: N/A   Occupational History  .  [employer]   Social History Main Topics  . Smoking status: Former   Smoker    Quit date: 02/06/1989  . Smokeless tobacco: Not on file  . Alcohol Use: Yes     Comment: 2 bottles of wine per week  . Drug Use: No  . Sexual Activity: Not on file   Other Topics Concern  . 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  He is single and has no children.    Family History  Problem Relation Age of Onset  . Coronary artery disease Father   . Stroke Mother     died  . Atrial fibrillation Brother   . Heart attack Father   . Mitral valve prolapse Sister     ROS is negative for fevers, chills or night sweats. He denies rash. He denies visual changes. He denies change in hearing. He is unaware of lymphadenopathy. He notes significant shortness of breath with activity as well as lying flat. He's now been sleeping in a recliner. He feels he cannot use his BiPAP therapy presently due to the shortness of breath. A previous cardiac catheterization had shown mild luminal irregularities of his coronary anatomy. He denies any prolonged episode of chest pressure. He denies nausea vomiting or diarrhea. He denies blood in stool or urine. He continues to have difficulty with his left hand which is now still bandage following his surgery done [place] he may require carpal tunnel surgery. He continues to note leg swelling bilaterally left leg greater than right. He denies tremors. There is no diabetes   Other comprehensive 12 point system review is negative.  PE BP 122/80  Pulse 90  Ht 6' 2" (1.88 m)  Wt 220 lb 11.2 oz (100.109 kg)  BMI 28.32 kg/m2  No obvious pulsus paradoxicus General: Alert, oriented, no distress.  Skin: normal turgor, no rashes HEENT: Normocephalic, atraumatic. Pupils round and reactive; sclera anicteric;no lid lag.  Nose without nasal septal hypertrophy Mouth/Parynx benign; Mallinpatti scale 3 Neck: No JVD, no carotid briuts; no hepatojugular reflux jugular reflux Lungs: clear to ausculatation and percussion; no wheezing or rales Heart: RRR, s1 s2 normal soft friction rub, 1/6 systolic murmur at the apex. Abdomen: soft, nontender; no hepatosplenomehaly, BS+; abdominal aorta nontender and not dilated by palpation. Pulses 2+ Extremities: no clubbing cyanosis or edema, Homan's sign negative  Neurologic: grossly nonfocal Psychologic: normal affect, but obviously becoming scared and anxious about his worsening dyspnea  ECG: Sinus rhythm at 90 beats per minute. Poor anterior R-wave progression V1 through V4 with QRS complex. Mild left axis deviation.  LABS:  BMET    Component Value Date/Time   NA 142 03/14/2013 1617   K 3.8 03/14/2013 1617   CL 102 03/14/2013 1617   CO2 30 03/14/2013 1617   GLUCOSE 103* 03/14/2013 1617   BUN 25* 03/14/2013 1617   CREATININE 1.07 03/14/2013 1617   CREATININE 0.9 02/22/2013 1232   CALCIUM 9.5 03/14/2013 1617   GFRNONAA 88* 09/07/2011 0528   GFRAA >90 09/07/2011 0528     Hepatic Function Panel     Component Value Date/Time   PROT 6.9 03/14/2013 1617   ALBUMIN 4.5 03/14/2013 1617   AST 26 03/14/2013 1617   ALT 24 03/14/2013 1617   ALKPHOS 71 03/14/2013 1617   BILITOT 0.5 03/14/2013 1617     CBC    Component Value Date/Time   WBC   8.4 03/14/2013 1619   RBC 4.85  03/14/2013 1619   HGB 14.0 03/14/2013 1619   HCT 41.7 03/14/2013 1619   PLT 239 03/14/2013 1619   MCV 86.0 03/14/2013 1619   MCH 28.9 03/14/2013 1619   MCHC 33.6 03/14/2013 1619   RDW 13.8 03/14/2013 1619   LYMPHSABS 2.3 02/06/2013 1355   MONOABS 0.8 02/06/2013 1355   EOSABS 0.2 02/06/2013 1355   BASOSABS 0.0 02/06/2013 1355     BNP    Component Value Date/Time   PROBNP 58.97 03/14/2013 1615    Lipid Panel     Component Value Date/Time   CHOL  Value: 188        ATP III CLASSIFICATION:  <200     mg/dL   Desirable  200-239  mg/dL   Borderline High  >=240    mg/dL   High 08/23/2007 0910   TRIG 63 08/23/2007 0910   HDL 47 08/23/2007 0910   CHOLHDL 4.0 08/23/2007 0910   VLDL 13 08/23/2007 0910   LDLCALC  Value: 128        Total Cholesterol/HDL:CHD Risk Coronary Heart Disease Risk Table                     Men   Women  1/2 Average Risk   3.4   3.3* 08/23/2007 0910     RADIOLOGY: Dg Chest 2 View  03/10/2013   CLINICAL DATA:  Short of breath  EXAM: CHEST  2 VIEW  COMPARISON:  08/22/2010  FINDINGS: Mild cardiac enlargement. Negative for heart failure. Lungs are clear without infiltrate effusion or mass.  IMPRESSION: No active cardiopulmonary disease.   Electronically Signed   By: Charles  Clark M.D.   On: 03/10/2013 15:53   Ct Angio Chest W/cm &/or Wo Cm  03/10/2013   CLINICAL DATA:  Shortness of breath. Ascending thoracic aortic aneurysm. Pericardial effusion.  EXAM: CT ANGIOGRAPHY CHEST WITH CONTRAST  TECHNIQUE: Multidetector CT imaging of the chest was performed using the standard protocol during bolus administration of intravenous contrast. Multiplanar CT image reconstructions including MIPs were obtained to evaluate the vascular anatomy.  CONTRAST:  100mL OMNIPAQUE IOHEXOL 350 MG/ML SOLN  COMPARISON:  Chest radiographs obtained earlier today and chest CT dated 09/17/2005.  FINDINGS: The ascending thoracic aorta remains borderline enlarged, measuring 4.0 cm in maximum transverse diameter on image  number 158 of series 5. Moderate sized pericardial effusion measuring 17.5 mm in thickness on image number 79 of series 4. Normally opacified pulmonary arteries. No pulmonary arterial filling defects are seen. Clear lungs. No lung nodules or enlarged lymph nodes. 8 mm probable cyst in the superior aspect of the lateral segment of the left lobe of the liver on image number 85 and 11 mm probable cyst in the lateral segment of the left lobe of the liver on image number 93, both mildly increased in size since the previous examination. There is also a 5 mm rounded hyper enhancing mass in the anterior aspect of the lateral segment of the left lobe of the liver on image number 89, not definitely seen previously. Thoracic spine degenerative changes.  Review of the MIP images confirms the above findings.  IMPRESSION: 1. Moderate-sized pericardial effusion, increased in size. 2. Stable borderline aneurysmal dilatation of the ascending thoracic aorta. 3. No pulmonary emboli. 4. Small probable cysts in the liver and small hyper enhancing liver mass, as described above.   Electronically Signed   By: Steve  Reid M.D.   On: 03/10/2013 15:50        ASSESSMENT AND PLAN:  I had a long discussion with [name] [name]. 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 His ejection fraction susceptibly improved. He is status post prior atrial fibrillation  ablations with his last being done in 2009. He's been documented to have a pericardial effusion since 2007. He recently had developed recurrent atrial fibrillation which he felt was contributing to his shortness of breath. He underwent successful cardioversion over one month ago and has been maintaining sinus rhythm since. However, he has not noticed any significant improvement in his dyspnea and shortness of breath and in fact he feels that this is progressively becoming worse. He is now becoming very anxious. His recent  CT scan did show progressive increase in the pericardial effusion size which is now characterized as moderate and measured 1.74 cm on CT imaging. He did have mild luminal irregularities a prior cardiac catheterization. His ECG does show poor progression anteriorly. After much discussion and also after discussing him at length with his brothers sister-in-law who is a former nurse practitioner for our office, Ms. Beverly Baum, I have recommended a right and left heart catheterization be undertaken for further evaluation.. This will be helpful to make certain he does not have restrictive or constrictive pericardial findings on right heart hemodynamic tracings.  He will remain off his anticoagulation Pradaxa for the remainder of the week with his last dose take being this morning 03/14/2013. I will schedule him for this procedure to be done on Friday, 03/17/2013 at [facility]. I discussed with him that we may ultimately refer him to cardiac surgery for consideration of possible subxiphoid pericardial window for his chronic pericardial effusion.     Nicolena Schurman A. Finnlee Silvernail, MD, FACC  03/15/2013 6:30 PM    

## 2013-03-20 ENCOUNTER — Ambulatory Visit: Payer: Medicare Other | Admitting: Internal Medicine

## 2013-03-20 ENCOUNTER — Other Ambulatory Visit: Payer: Self-pay | Admitting: *Deleted

## 2013-03-20 DIAGNOSIS — R609 Edema, unspecified: Secondary | ICD-10-CM

## 2013-03-22 ENCOUNTER — Encounter: Payer: Self-pay | Admitting: Surgery

## 2013-03-22 ENCOUNTER — Ambulatory Visit: Payer: Medicare Other | Admitting: Cardiovascular Disease

## 2013-03-22 ENCOUNTER — Institutional Professional Consult (permissible substitution) (INDEPENDENT_AMBULATORY_CARE_PROVIDER_SITE_OTHER): Payer: Medicare Other | Admitting: Surgery

## 2013-03-22 VITALS — BP 132/83 | HR 74 | Resp 16 | Ht 74.0 in | Wt 218.0 lb

## 2013-03-22 DIAGNOSIS — I313 Pericardial effusion (noninflammatory): Secondary | ICD-10-CM

## 2013-03-22 DIAGNOSIS — I319 Disease of pericardium, unspecified: Secondary | ICD-10-CM

## 2013-03-22 DIAGNOSIS — I3139 Other pericardial effusion (noninflammatory): Secondary | ICD-10-CM

## 2013-03-23 ENCOUNTER — Ambulatory Visit (HOSPITAL_COMMUNITY)
Admission: RE | Admit: 2013-03-23 | Discharge: 2013-03-23 | Disposition: A | Discharge status: Home / Self Care | Payer: Medicare Other | Source: Ambulatory Visit | Attending: Cardiovascular Disease | Admitting: Cardiovascular Disease

## 2013-03-23 DIAGNOSIS — M7989 Other specified soft tissue disorders: Secondary | ICD-10-CM

## 2013-03-23 DIAGNOSIS — R609 Edema, unspecified: Secondary | ICD-10-CM

## 2013-03-23 NOTE — Progress Notes (Signed)
Lower Extremity Venous Duplex Completed. °Brianna L Mazza,RVT °

## 2013-03-24 ENCOUNTER — Encounter: Payer: Self-pay | Admitting: Surgery

## 2013-03-24 NOTE — Progress Notes (Signed)
PCP is Beverley Fiedler, MD Referring Provider is Lennette Bihari, MD  Chief Complaint  Patient presents with  . 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 He also has a history of PAF and underwent 2 ablation procedures in 2002 and 2009. He has been known to have a pericardial effusion since 2007. An echo in 2012 showed a small effusion. He had been maintaining sinus rhythm on Flecainide and anticoagulated with Pradaxa until he returned from [place] after spending last summer there producing a play. He developed recurrent A-fib and was cardioverted on 02/08/2013. He now reports progressive shortness of breath and lower extremity edema. An echo on 03/06/2013 showed a moderate pericardial effusion that had increased from his prior echo. There was no sign of tamponade. His diuretic was increased. A CT scan of the chest ruled out PE. There was borderline enlargement of the ascending aorta at 4.0 cm. It showed a moderate concentric pericardial effusion. There were some small cysts in the liver and a 5 mm rounded hyperenhancing mass in the left lobe of the liver of unclear significance. He subsequently underwent R and L heart cath on 03/17/2013 which showed no significant coronary disease. LV function was preserved at 50-55%. There was no hemodynamic evidence of constrictive pericarditis and his pericardium did not look thickened on CT.  Past Medical History  Diagnosis Date  . Paroxysmal atrial fibrillation     s/p afib 2001 and 2009 by Dr Delena Serve  . Coronary artery disease     no significant CAD by cath 5/12  . Cardiomyopathy     hx of tachycardia mediated CM, now resolved  . Hypertension   . Hyperlipidemia   . Thoracic aortic aneurysm   . Depression   . Sleep apnea     AHI  avg 5/hr  . Limb pain 06/20/2007    LE venous doppler - no evidence of thrombus or thrombophlebitis  . PVC (premature ventricular contraction) 12/18/2009    cardionet monitor 21 days - some PVCs, ventricular bigeminy  . Pericardial effusion   . Echocardiogram abnormal     EF60%, mild LVH, PA pk pressure 35, pericardial effusion mild increase from 2012     Past Surgical History  Procedure Laterality Date  . Cardiac catheterization  08/25/10    NO SIGNIFICANT CAD  . Cardioversion  10/18/2007    successful  . Elbow / upper arm foreign body removal    . Incision and drainage of wound  09/07/2011    Procedure: IRRIGATION AND DEBRIDEMENT WOUND;  Surgeon: Nestor Lewandowsky, MD;  Location: [facility] OR;  Service: Orthopedics;  Laterality: Left;  . Transesophageal echocardiogram  08/26/2010    see Notes tab  . Cardiovascular stress test  09/08/2005    R/S MV - EF 61%; Exercises capacity 12 Mets; essentially normal perfusion myocardial scan    . Cardiac electrophysiology mapping and ablation  [id number]    Dr. Ronn Melena  . Osteosynthesis Left 09/30/2012    with small plate in the ulna  . Cardioversion N/A 02/08/2013    Procedure: CARDIOVERSION;  Surgeon: Lennette Bihari, MD;  Location: [facility] ENDOSCOPY;  Service: Cardiovascular;  Laterality: N/A;    Family History  Problem Relation Age of Onset  . Coronary artery  disease Father   . Stroke Mother     died  . Atrial fibrillation Brother   . Heart attack Father   . Mitral valve prolapse Sister     Social History History  Substance Use Topics  . Smoking status: Former Smoker -- 1.00 packs/day for 10 years    Quit date: 02/06/1989  . Smokeless tobacco: Never Used  . Alcohol Use: Yes     Comment: 2 bottles of wine per week    Current Outpatient Prescriptions  Medication Sig Dispense Refill  . atorvastatin (LIPITOR) 40 MG tablet Take 40 mg by mouth daily.      . Cholecalciferol (VITAMIN D) 2000 UNITS CAPS Take 2,000 Units by mouth daily.      .  dabigatran (PRADAXA) 150 MG CAPS Take 150 mg by mouth every 12 (twelve) hours.        . finasteride (PROPECIA) 1 MG tablet Take 1 mg by mouth every morning.       . flecainide (TAMBOCOR) 100 MG tablet Take 100 mg by mouth 2 (two) times daily.      . furosemide (LASIX) 40 MG tablet Take 2 tablets (80 mg total) by mouth daily. Take another 40 mg in the evening  75 tablet  6  . LORazepam (ATIVAN) 0.5 MG tablet Take 0.5 mg by mouth at bedtime.       . metoprolol succinate (TOPROL-XL) 100 MG 24 hr tablet Take 1 tablet (100 mg total) by mouth daily. Take with or immediately following a meal.  90 tablet  4  . pantoprazole (PROTONIX) 40 MG tablet Take one tab twice daily for one week, then once daily for silent reflux  40 tablet  1  . potassium chloride SA (K-DUR,KLOR-CON) 20 MEQ tablet Take 1.5 tablets (30 mEq total) by mouth daily.  45 tablet  6  . Saw Palmetto, Serenoa repens, (SAW PALMETTO PO) Take 1 tablet by mouth 2 (two) times daily.       No current facility-administered medications for this visit.    Allergies  Allergen Reactions  . Tetracyclines & Related Hives    Review of Systems  Constitutional: Negative for fever, chills, activity change and fatigue.  HENT: Negative.   Eyes: Negative.   Respiratory: Positive for shortness of breath. Negative for chest tightness.        Orthopnea  Cardiovascular: Positive for palpitations and leg swelling. Negative for chest pain.  Gastrointestinal: Negative.   Endocrine: Negative.   Genitourinary: Negative.   Musculoskeletal: Negative for gait problem and myalgias.       Recent ORIF of left forearm ulnar and radial fracture in [place] after a fall. He still needs to have a Carpal Tunnel release.  Skin: Negative.   Allergic/Immunologic: Negative.   Neurological: Negative for dizziness and syncope.  Hematological: Negative.   Psychiatric/Behavioral: Negative.     BP 132/83  Pulse 74  Resp 16  Ht 6\' 2"  (1.88 m)  Wt 218 lb (98.884 kg)   BMI 27.98 kg/m2  SpO2 96% Physical Exam  Constitutional: He is oriented to person, place, and time. He appears well-developed and well-nourished. No distress.  HENT:  Head: Normocephalic and atraumatic.  Mouth/Throat: Oropharynx is clear and moist.  Eyes: EOM are normal. Pupils are equal, round, and reactive to light.  Neck: Normal range of motion. Neck supple. No JVD present. No thyromegaly present.  Cardiovascular: Normal rate, regular rhythm, normal heart sounds and intact distal pulses.   No murmur heard. Pulmonary/Chest: Effort normal  and breath sounds normal. No respiratory distress. He has no wheezes. He has no rales.  Abdominal: Soft. Bowel sounds are normal. He exhibits no distension and no mass. There is no tenderness.  Musculoskeletal:  Mild bilateral lower leg edema.  Lymphadenopathy:    He has no cervical adenopathy.  Neurological: He is alert and oriented to person, place, and time. No cranial nerve deficit.  Normal strength in right upper and bilateral lower extremities. Left upper extremity in a brace.    Diagnostic Tests:  [facility] [address] [place], [place] [address] [phone]  ------------------------------------------------------------ Transthoracic Echocardiography  Patient: Redmond, Whittley MR #: [id number] Study Date: 03/06/2013 Gender: M Age: 89 Height: 193cm Weight: 97.1kg BSA: 2.61m^2 Pt. Status: Room:  ATTENDING Hillis Range, MD ORDERING Hillis Range, MD REFERRING Hillis Range, MD PERFORMING [facility] SONOGRAPHER Junious Dresser, RDCS cc:  ------------------------------------------------------------ LV EF: 60%  ------------------------------------------------------------ Indications: Atrial fibrillation - 427.31. Edema 782.3. Shortness of breath 786.05.  ------------------------------------------------------------ History: PMH: Acquired from the patient and from the patient's chart. Orthopnea, dyspnea,  cough, and bilateral lower extremity edema. Atrial fibrillation. Cardiomyopathy.  ------------------------------------------------------------ Study Conclusions  - Left ventricle: The cavity size was normal. Wall thickness was increased in a pattern of mild LVH. The estimated ejection fraction was 60%. Wall motion was normal; there were no regional wall motion abnormalities. - Left atrium: The atrium was mildly dilated. - Right ventricle: Systolic function was mildly reduced. - Pulmonary arteries: PA peak pressure: 35mm Hg (S). - Pericardium, extracardiac: There is a Moderate Circumferential Pericardial Effusion,. There is no tamponade. There was a circumferential effusion May, 2012 echo. Currently, the effusion may be a little larger.  ------------------------------------------------------------ Labs, prior tests, procedures, and surgery: Transesophageal echocardiography (2009). The mitral valve showed mild regurgitation. The aortic valve showed mild regurgitation. EF was 55%.  Transthoracic echocardiography. M-mode, complete 2D, spectral Doppler, and color Doppler. Height: Height: 193cm. Height: 76in. Weight: Weight: 97.1kg. Weight: 213.6lb. Body mass index: BMI: 26kg/m^2. Body surface area: BSA: 2.46m^2. Blood pressure: 120/75. Patient status: Outpatient. Location: [facility]  ------------------------------------------------------------  ------------------------------------------------------------ Left ventricle: The cavity size was normal. Wall thickness was increased in a pattern of mild LVH. The estimated ejection fraction was 60%. Wall motion was normal; there were no regional wall motion abnormalities.  ------------------------------------------------------------ Aortic valve: Structurally normal valve. Cusp separation was normal. Doppler: Transvalvular velocity was within the normal range. There was no stenosis. No  regurgitation.  ------------------------------------------------------------ Aorta: Aortic root: The aortic root was normal in size.  ------------------------------------------------------------ Mitral valve: Structurally normal valve. Leaflet separation was normal. Doppler: Transvalvular velocity was within the normal range. There was no evidence for stenosis. No regurgitation. Peak gradient: 3mm Hg (D).  ------------------------------------------------------------ Left atrium: The atrium was mildly dilated.  ------------------------------------------------------------ Right ventricle: The cavity size was normal. Wall thickness was normal. Systolic function was mildly reduced.  ------------------------------------------------------------ Pulmonic valve: The valve appears to be grossly normal. Doppler: No significant regurgitation.  ------------------------------------------------------------ Tricuspid valve: Structurally normal valve. Leaflet separation was normal. Doppler: Transvalvular velocity was within the normal range. Mild regurgitation.  ------------------------------------------------------------ Right atrium: The atrium was at the upper limits of normal in size.  ------------------------------------------------------------ Pericardium: There is a Moderate Circumferential Pericardial Effusion,. There is no tamponade.  ------------------------------------------------------------  2D measurements Normal Doppler measurements Normal Left ventricle Main pulmonary LVID ED, 48.8 mm 43-52 artery chord, Pressure, 35 mm Hg =30 PLAX S LVID ES, 32.5 mm 23-38 Left ventricle chord, Ea, lat 5.81 cm/s ------ PLAX ann,  tiss FS, chord, 33 % >29 DP PLAX E/Ea, lat 14.18 ------ LVPW, ED 9.61 mm ------ ann, tiss IVS/LVPW 1.44 <1.3 DP ratio, ED Ea, med 4.61 cm/s ------ Vol ED, 117 ml ------ ann, tiss MOD1 DP Vol ES, 48 ml ------ E/Ea, med 17.87 ------ MOD1 ann, tiss EF, MOD1  59 % ------ DP Vol index, 51 ml/m^2 ------ LVOT ED, MOD1 Peak vel, 85.7 cm/s ------ Vol index, 21 ml/m^2 ------ S ES, MOD1 VTI, S 18.6 cm ------ Vol ED, 112 ml ------ Stroke vol 91.3 ml ------ MOD2 Stroke 40 ml/m^ ------ Vol ES, 45 ml ------ index 2 MOD2 Mitral valve EF, MOD2 60 % ------ Peak E vel 82.4 cm/s ------ Stroke 67 ml ------ Peak A vel 46.4 cm/s ------ vol, MOD2 Decelerati 225 ms 150-23 Vol index, 49 ml/m^2 ------ on time 0 ED, MOD2 Peak 3 mm Hg ------ Vol index, 20 ml/m^2 ------ gradient, ES, MOD2 D Stroke 29.4 ml/m^2 ------ Peak E/A 1.8 ------ index, ratio MOD2 Tricuspid valve Ventricular septum Regurg 274 cm/s ------ IVS, ED 13.8 mm ------ peak vel LVOT Peak RV-RA 30 mm Hg ------ Diam, S 25 mm ------ gradient, Area 4.91 cm^2 ------ S Diam 25 mm ------ Systemic veins Aorta Estimated 5 mm Hg ------ Root diam, 45 mm ------ CVP ED Right ventricle Left atrium Pressure, 35 mm Hg <30 AP dim 44 mm ------ S AP dim 1.93 cm/m^2 <2.2 Sa vel, 9.54 cm/s ------ index lat ann, tiss DP  ------------------------------------------------------------ Prepared and Electronically Authenticated by  Willa Rough 2014-12-01T13:16:00.840   CLINICAL DATA: Shortness of breath. Ascending thoracic aortic  aneurysm. Pericardial effusion.  EXAM:  CT ANGIOGRAPHY CHEST WITH CONTRAST  TECHNIQUE:  Multidetector CT imaging of the chest was performed using the  standard protocol during bolus administration of intravenous  contrast. Multiplanar CT image reconstructions including MIPs were  obtained to evaluate the vascular anatomy.  CONTRAST: OMNIPAQUE IOHEXOL 350 MG/ML SOLN  COMPARISON: Chest radiographs obtained earlier today and chest CT  dated 09/17/2005.  FINDINGS:  The ascending thoracic aorta remains borderline enlarged, measuring  4.0 cm in maximum transverse diameter on image number 158 of series  5. Moderate sized pericardial effusion measuring 17.5 mm in  thickness on  image number 79 of series 4. Normally opacified  pulmonary arteries. No pulmonary arterial filling defects are seen.  Clear lungs. No lung nodules or enlarged lymph nodes. 8 mm probable  cyst in the superior aspect of the lateral segment of the left lobe  of the liver on image number 85 and 11 mm probable cyst in the  lateral segment of the left lobe of the liver on image number 93,  both mildly increased in size since the previous examination. There  is also a 5 mm rounded hyper enhancing mass in the anterior aspect  of the lateral segment of the left lobe of the liver on image number  89, not definitely seen previously. Thoracic spine degenerative  changes.  Review of the MIP images confirms the above findings.  IMPRESSION:  1. Moderate-sized pericardial effusion, increased in size.  2. Stable borderline aneurysmal dilatation of the ascending thoracic  aorta.  3. No pulmonary emboli.  4. Small probable cysts in the liver and small hyper enhancing liver  mass, as described above.  Electronically Signed  By: Gordan Payment M.D.  On: 03/10/2013 15:50   Cardiac Cath:  PROCEDURE:  The patient was brought to the second floor [facility] cath lab in the postabsorptive state. His right  femoral artery and right femoral vein were punctured and a 7 French venous sheath and 5 French arterial sheath were inserted without difficulty. A Swan-Ganz catheter was advanced to the right atrium, and care for pressure monitoring was performed with fasting speed evaluate waveform contour. A 5 French pigtail catheter was inserted into the right femoral artery sheath and advanced to the central aorta.  The catheter was then advanced into the right ventricle and right ventricular pressure was recorded. The pigtail catheter was advanced into the left ventricle and anus left ventricular and right ventricular pressures were recorded. At this time, the patient did appear to be dry and had inadvertently taking his  morning dose of Lasix. Lahey was treated with fluid bolus. He catheter was then advanced to the pulmonary artery oxygen saturation was obtained. Poor capillary wedge pressure was then recorded as was simultaneous LV and PC pressures. The Ernestine Conrad was then advanced back into the pulmonary artery and cardiac output measurements were obtained which gave additional 60 cc fluid bolus. The Swan catheter was then pulled back into the right ventricle and hemodynamic simultaneous recording was again made of the left ventricular and right ventricular pressures. Where as initially PE BP was very low and equal following fluid challenge the LVEDP was now greater than the RVEDP arguing against constrictive hemodynamics. Completion of the right heart pullback was then performed. Left ventriculography was done with 25 cc of contrast at 12 cc per second. An LV to AO pullback was performed. Attention was then directed at the coronary arteries and diagnostic catheterization was done utilizing 5 French Judkins 4 left and right artery catheters. No stasis bite was obtained by direct manual pressure. The patient tolerated the procedure well.  HEMODYNAMICS:   Right Atrium: Mean pressure 6. There was no evidence for a rapid Y descent in early diastole arguing against constrictive hemodynamics.  Right ventricle: 27/4 , post a wave 7  Simultaneous Left ventricular pressure 110/5 post a wave 11  PA pressure 28/10, mean 18, capillary wedge pressure mean 10  Following a fluid challenge:  Left ventricular pressure 106/11/14  Right ventricular pressure: 32/2/7  Right atrial pressure: 7/5/5  No evidence for pulsus paradoxius or cardiac tamponade hemodynamics.  Pullback pressure: LV 120/15  Ao: 120/65  Oxygen saturation: Pulmonary artery 69%; central aorta 95%  Cardiac output: Fick method 6.1; thermodilution method 3.7 l/min  Cardiac index: 2.7 1.6 l/min/m2  ANGIOGRAPHY:  1. Left main: Angiographically normal vessel  2. LAD: Minimal  proximal luminal irregularity but otherwise angiographically normal vessel which gave rise to 2 major diagonal vessels and septal perforating artery.  3. Left circumflex: Angiographically normal vessel  4. Right coronary artery: Angiographically normal dominant right coronary artery  Left ventriculography revealed global ejection fraction of 50-55%. There was normal to minimal aortic root dilatation. There were no focal segmental wall motion abnormalities.  IMPRESSION:  No significant coronary obstructive disease with essentially normal coronary arteries only minimal luminal irregularity of the proximal LAD.  Normal to low normal global LV function without focal segmental wall motion abnormalities with an ejection fraction of approximately 50-55%.  No hemodynamic evidence for constrictive pericarditis with low RVEDP pressure, no evidence for early diastolic rapid Y descent, and following fluid challenge, no evidence for equalization of LVEDP and RVEDP.  No hemodynamic evidence for cardiac tamponade.  Lennette Bihari, MD, [facility]  03/17/2013  4:31 PM    Impression:  He has a moderate pericardial effusion with shortness of breath, orthopnea and lower extremity edema.  He has had a small effusion documented since 2007 and it was still present in 2012 and clearly a little larger now. There is no evidence of hemodynamic compromise by echo or vital signs and no signs of constrictive pericarditis. I think it would be best to drain this effusion by subxyphoid pericardial window to see if that resolves his symptoms. This would also allow diagnostic testing of the fluid and the pericardium although I suspect this will be idiopathic. I discussed the procedure with him including alternatives, benefits and risks including but not limited to bleeding, infection, recurrence of the effusion and the possibility that it may not resolve his symptoms. He understands and would like to proceed with surgical drainage. 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 He is symptomatic but stable.   Plan:  We will plan subxyphoid pericardial window on Tuesday 04/25/2013. He will stop his Pradaxa 3 days before surgery.

## 2013-03-27 ENCOUNTER — Other Ambulatory Visit: Payer: Self-pay | Admitting: *Deleted

## 2013-03-27 DIAGNOSIS — I313 Pericardial effusion (noninflammatory): Secondary | ICD-10-CM

## 2013-03-27 DIAGNOSIS — I3139 Other pericardial effusion (noninflammatory): Secondary | ICD-10-CM

## 2013-04-03 ENCOUNTER — Encounter: Payer: Self-pay | Admitting: Cardiology

## 2013-04-03 ENCOUNTER — Encounter: Payer: Self-pay | Admitting: Cardiovascular Disease

## 2013-04-03 NOTE — Telephone Encounter (Signed)
Message forwarded to Dr. Kelly.

## 2013-04-04 NOTE — Telephone Encounter (Signed)
Pt sent another message that he spoke w/ Dr. Laneta Simmers, who answered his question.

## 2013-04-12 ENCOUNTER — Telehealth: Payer: Self-pay | Admitting: Cardiovascular Disease

## 2013-04-12 NOTE — Telephone Encounter (Signed)
Spoke to [place]. She needs cardiac clearance for left carpal release. She states she received clearance from Dr Cyndia Bent, but will like Dr Claiborne Billings to clear patient.  RN informed Abigail Butts -- clearance form is  present ,will place for Dr Claiborne Billings to sign on 04/13/13.    She also needed a copy of sleep study. Notified [facility] to fax to orthopedic.

## 2013-04-12 NOTE — Telephone Encounter (Signed)
Wants to get the approval to stop pradaxa for pending surgery.  Please call

## 2013-04-14 NOTE — Telephone Encounter (Signed)
Clearance form faxed to [facility], Attn: Abigail Butts.  Chart on Sharon's desk w/ original fax.

## 2013-04-14 NOTE — Telephone Encounter (Signed)
Tonya from [facility] was calling in regards to getting [name] [name] sleep study report and signed copy of clearance. She was informed that medical records was supposed to send over the sleep study and we were waiting on Dr. Evette Georges signature for clearance.

## 2013-04-17 ENCOUNTER — Encounter: Payer: Self-pay | Admitting: Cardiovascular Disease

## 2013-04-18 ENCOUNTER — Telehealth: Payer: Self-pay | Admitting: Cardiovascular Disease

## 2013-04-18 ENCOUNTER — Encounter: Payer: Self-pay | Admitting: Cardiovascular Disease

## 2013-04-18 NOTE — Telephone Encounter (Signed)
Message forwarded to Dr. Arnette Norris, CMA. Message forwarded to L. Dorene Ar, NP Juluis Rainier)

## 2013-04-18 NOTE — Telephone Encounter (Signed)
She can not read the procedure on the form that you faxed her yesterday.

## 2013-04-20 NOTE — Telephone Encounter (Signed)
[name] [name] didn't know if this has been addressed. It was in my box.

## 2013-04-20 NOTE — Telephone Encounter (Signed)
Message forwarded to Dr. Kelly/Wanda, CMA.  

## 2013-04-21 NOTE — Pre-Procedure Instructions (Signed)
[name] [name]  04/21/2013   Your procedure is scheduled on:  Tuesday April 25, 2013.  Report to [facility] Short Stay Entrance "A"  Admitting at 7:30 AM.  Call this number if you have problems the morning of surgery: [phone]   Remember:   Do not eat food or drink liquids after midnight.   Take these medicines the morning of surgery with A SIP OF WATER: Flecainide (Tambocor), Metoprolol (Toprol XL), Pantoprazole (Protonix)  STOP taking Pradaxa as directed by your physician. STOP Aspirin, Goody's, BC's, Aleve (Naproxen), Ibuprofen (Advil or Motrin), Fish Oil, Vitamins, Herbal Supplements or any substance that could thin your blood starting today, 04/24/13.   Do not wear jewelry.  Do not wear lotions, powders, or colognes. You may wear deodorant.  Men may shave face and neck.  Do not bring valuables to the hospital.  [facility] is not responsible for any belongings or valuables.               Contacts, dentures or bridgework may not be worn into surgery.  Leave suitcase in the car. After surgery it may be brought to your room.  For patients admitted to the hospital, discharge time is determined by your treatment team.              Special Instructions: Shower using CHG soap the night before and the morning of your surgery   Please read over the following fact sheets that you were given: Pain Booklet, Coughing and Deep Breathing, Blood Transfusion Information, MRSA Information and Surgical Site Infection Prevention

## 2013-04-24 ENCOUNTER — Encounter (HOSPITAL_COMMUNITY)
Admission: RE | Admit: 2013-04-24 | Discharge: 2013-04-24 | Disposition: A | Discharge status: Home / Self Care | Payer: Medicare Other | Source: Ambulatory Visit | Attending: Surgery | Admitting: Surgery

## 2013-04-24 ENCOUNTER — Encounter (HOSPITAL_COMMUNITY): Payer: Self-pay

## 2013-04-24 ENCOUNTER — Ambulatory Visit (HOSPITAL_COMMUNITY)
Admission: RE | Admit: 2013-04-24 | Discharge: 2013-04-24 | Disposition: A | Discharge status: Home / Self Care | Payer: Medicare Other | Source: Ambulatory Visit | Attending: Surgery | Admitting: Surgery

## 2013-04-24 VITALS — BP 122/77 | HR 83 | Temp 98.1°F | Resp 20 | Ht 74.0 in | Wt 226.0 lb

## 2013-04-24 DIAGNOSIS — Z01818 Encounter for other preprocedural examination: Secondary | ICD-10-CM | POA: Insufficient documentation

## 2013-04-24 DIAGNOSIS — I440 Atrioventricular block, first degree: Secondary | ICD-10-CM

## 2013-04-24 DIAGNOSIS — I3139 Other pericardial effusion (noninflammatory): Secondary | ICD-10-CM

## 2013-04-24 DIAGNOSIS — Z01812 Encounter for preprocedural laboratory examination: Secondary | ICD-10-CM

## 2013-04-24 DIAGNOSIS — I319 Disease of pericardium, unspecified: Secondary | ICD-10-CM

## 2013-04-24 DIAGNOSIS — I313 Pericardial effusion (noninflammatory): Secondary | ICD-10-CM

## 2013-04-24 HISTORY — DX: Adverse effect of unspecified anesthetic, initial encounter: T41.45XA

## 2013-04-24 HISTORY — DX: Other complications of anesthesia, initial encounter: T88.59XA

## 2013-04-24 LAB — COMPREHENSIVE METABOLIC PANEL
ALT: 30 U/L (ref 0–53)
AST: 28 U/L (ref 0–37)
Albumin: 4.1 g/dL (ref 3.5–5.2)
Alkaline Phosphatase: 76 U/L (ref 39–117)
BUN: 27 mg/dL — ABNORMAL HIGH (ref 6–23)
CALCIUM: 9.4 mg/dL (ref 8.4–10.5)
CO2: 22 meq/L (ref 19–32)
Chloride: 100 mEq/L (ref 96–112)
Creatinine, Ser: 1.05 mg/dL (ref 0.50–1.35)
GFR calc non Af Amer: 72 mL/min — ABNORMAL LOW (ref >90)
GFR, EST AFRICAN AMERICAN: 83 mL/min — AB (ref >90)
Glucose, Bld: 112 mg/dL — ABNORMAL HIGH (ref 70–99)
Potassium: 4.2 mEq/L (ref 3.7–5.3)
SODIUM: 139 meq/L (ref 137–147)
TOTAL PROTEIN: 7 g/dL (ref 6.0–8.3)
Total Bilirubin: 0.4 mg/dL (ref 0.3–1.2)

## 2013-04-24 LAB — CBC
HCT: 37.5 % — ABNORMAL LOW (ref 39.0–52.0)
HEMOGLOBIN: 12.7 g/dL — AB (ref 13.0–17.0)
MCH: 29.7 pg (ref 26.0–34.0)
MCHC: 33.9 g/dL (ref 30.0–36.0)
MCV: 87.8 fL (ref 78.0–100.0)
Platelets: 206 10*3/uL (ref 150–400)
RBC: 4.27 MIL/uL (ref 4.22–5.81)
RDW: 13.7 % (ref 11.5–15.5)
WBC: 7 10*3/uL (ref 4.0–10.5)

## 2013-04-24 LAB — BLOOD GAS, ARTERIAL
ACID-BASE EXCESS: 2.9 mmol/L — AB (ref 0.0–2.0)
Bicarbonate: 26.4 mEq/L — ABNORMAL HIGH (ref 20.0–24.0)
FIO2: 0.21 %
O2 SAT: 96.4 %
PO2 ART: 95.4 mmHg (ref 80.0–100.0)
Patient temperature: 98.6
TCO2: 27.5 mmol/L (ref 0–100)
pCO2 arterial: 36.6 mmHg (ref 35.0–45.0)
pH, Arterial: 7.471 — ABNORMAL HIGH (ref 7.350–7.450)

## 2013-04-24 LAB — URINALYSIS, ROUTINE W REFLEX MICROSCOPIC
Bilirubin Urine: NEGATIVE
Glucose, UA: NEGATIVE mg/dL
HGB URINE DIPSTICK: NEGATIVE
Ketones, ur: NEGATIVE mg/dL
Leukocytes, UA: NEGATIVE
Nitrite: NEGATIVE
PROTEIN: NEGATIVE mg/dL
Specific Gravity, Urine: 1.017 (ref 1.005–1.030)
Urobilinogen, UA: 0.2 mg/dL (ref 0.0–1.0)
pH: 5 (ref 5.0–8.0)

## 2013-04-24 LAB — PROTIME-INR
INR: 0.9 (ref 0.00–1.49)
Prothrombin Time: 12 seconds (ref 11.6–15.2)

## 2013-04-24 LAB — ABO/RH: ABO/RH(D): A POS

## 2013-04-24 LAB — TYPE AND SCREEN
ABO/RH(D): A POS
ANTIBODY SCREEN: NEGATIVE

## 2013-04-24 LAB — APTT: APTT: 25 s (ref 24–37)

## 2013-04-24 LAB — SURGICAL PCR SCREEN
MRSA, PCR: NEGATIVE
Staphylococcus aureus: NEGATIVE

## 2013-04-24 MED ORDER — DEXTROSE 5 % IV SOLN
1.5000 g | INTRAVENOUS | Status: AC
  Administered 2013-04-25: 1.5 g via INTRAVENOUS
  Filled 2013-04-24: qty 1.5

## 2013-04-24 NOTE — Progress Notes (Signed)
  PCP is Dr. Jordan Hawks Rankins with [facility] [place]  Cardiologist is Dr. Georgina Peer with [facility]  Dr. Rayann Heman is Electrophysiologist.

## 2013-04-24 NOTE — Progress Notes (Signed)
  Called Abigail Butts with [employer] (note in [place] said she had most recent sleep study and clearance from Dr. Evette Georges office). Pt to have surgery in the morning. This nurse was told that pt needed carpal tunnel release and that Dr. Claiborne Billings would give clearance after surgery on 04/25/13 and that pt was cleared by Dr. Cyndia Bent, will call Dr. Evette Georges office. Abigail Butts to fax over sleep study.

## 2013-04-24 NOTE — Progress Notes (Addendum)
Dr. Evette Georges office called, spoke with Cecilie Kicks, NP whom told this nurse that pt was referred to Dr. Cyndia Bent by Dr. Claiborne Billings for surgery.

## 2013-04-25 ENCOUNTER — Inpatient Hospital Stay (HOSPITAL_COMMUNITY): Payer: Medicare Other

## 2013-04-25 ENCOUNTER — Encounter (HOSPITAL_COMMUNITY): Payer: Self-pay | Admitting: *Deleted

## 2013-04-25 ENCOUNTER — Encounter (HOSPITAL_COMMUNITY)
Admission: RE | Disposition: A | Discharge status: Home / Self Care | Payer: Self-pay | Source: Ambulatory Visit | Attending: Surgery

## 2013-04-25 ENCOUNTER — Inpatient Hospital Stay (HOSPITAL_COMMUNITY): Payer: Medicare Other | Admitting: Certified Registered"

## 2013-04-25 ENCOUNTER — Encounter (HOSPITAL_COMMUNITY): Payer: Medicare Other | Admitting: Certified Registered"

## 2013-04-25 ENCOUNTER — Inpatient Hospital Stay (HOSPITAL_COMMUNITY)
Admission: RE | Admit: 2013-04-25 | Discharge: 2013-04-29 | DRG: 238 | Disposition: A | Discharge status: Home / Self Care | Payer: Medicare Other | Source: Ambulatory Visit | Attending: Surgery | Admitting: Surgery

## 2013-04-25 DIAGNOSIS — F329 Major depressive disorder, single episode, unspecified: Secondary | ICD-10-CM | POA: Diagnosis present

## 2013-04-25 DIAGNOSIS — Z8249 Family history of ischemic heart disease and other diseases of the circulatory system: Secondary | ICD-10-CM

## 2013-04-25 DIAGNOSIS — I428 Other cardiomyopathies: Secondary | ICD-10-CM | POA: Diagnosis present

## 2013-04-25 DIAGNOSIS — I2510 Atherosclerotic heart disease of native coronary artery without angina pectoris: Secondary | ICD-10-CM | POA: Diagnosis present

## 2013-04-25 DIAGNOSIS — F3289 Other specified depressive episodes: Secondary | ICD-10-CM | POA: Diagnosis present

## 2013-04-25 DIAGNOSIS — I712 Thoracic aortic aneurysm, without rupture, unspecified: Secondary | ICD-10-CM | POA: Diagnosis present

## 2013-04-25 DIAGNOSIS — I739 Peripheral vascular disease, unspecified: Secondary | ICD-10-CM | POA: Diagnosis present

## 2013-04-25 DIAGNOSIS — I319 Disease of pericardium, unspecified: Principal | ICD-10-CM | POA: Diagnosis present

## 2013-04-25 DIAGNOSIS — I313 Pericardial effusion (noninflammatory): Secondary | ICD-10-CM

## 2013-04-25 DIAGNOSIS — E785 Hyperlipidemia, unspecified: Secondary | ICD-10-CM | POA: Diagnosis present

## 2013-04-25 DIAGNOSIS — Z823 Family history of stroke: Secondary | ICD-10-CM

## 2013-04-25 DIAGNOSIS — Z87891 Personal history of nicotine dependence: Secondary | ICD-10-CM

## 2013-04-25 DIAGNOSIS — I4891 Unspecified atrial fibrillation: Secondary | ICD-10-CM | POA: Diagnosis present

## 2013-04-25 DIAGNOSIS — G4733 Obstructive sleep apnea (adult) (pediatric): Secondary | ICD-10-CM | POA: Diagnosis present

## 2013-04-25 DIAGNOSIS — R609 Edema, unspecified: Secondary | ICD-10-CM | POA: Diagnosis present

## 2013-04-25 DIAGNOSIS — K7689 Other specified diseases of liver: Secondary | ICD-10-CM | POA: Diagnosis present

## 2013-04-25 DIAGNOSIS — Z9889 Other specified postprocedural states: Secondary | ICD-10-CM

## 2013-04-25 DIAGNOSIS — Z889 Allergy status to unspecified drugs, medicaments and biological substances status: Secondary | ICD-10-CM

## 2013-04-25 DIAGNOSIS — F411 Generalized anxiety disorder: Secondary | ICD-10-CM | POA: Diagnosis present

## 2013-04-25 DIAGNOSIS — I509 Heart failure, unspecified: Secondary | ICD-10-CM | POA: Diagnosis present

## 2013-04-25 DIAGNOSIS — I3139 Other pericardial effusion (noninflammatory): Secondary | ICD-10-CM

## 2013-04-25 DIAGNOSIS — I309 Acute pericarditis, unspecified: Secondary | ICD-10-CM

## 2013-04-25 DIAGNOSIS — Z79899 Other long term (current) drug therapy: Secondary | ICD-10-CM

## 2013-04-25 DIAGNOSIS — Z7901 Long term (current) use of anticoagulants: Secondary | ICD-10-CM

## 2013-04-25 DIAGNOSIS — I10 Essential (primary) hypertension: Secondary | ICD-10-CM | POA: Diagnosis present

## 2013-04-25 HISTORY — PX: SUBXYPHOID PERICARDIAL WINDOW: SHX5075

## 2013-04-25 SURGERY — CREATION, PERICARDIAL WINDOW, SUBXIPHOID APPROACH
Anesthesia: General | Site: Chest

## 2013-04-25 MED ORDER — KCL IN DEXTROSE-NACL 20-5-0.45 MEQ/L-%-% IV SOLN
INTRAVENOUS | Status: AC
  Filled 2013-04-25: qty 1000

## 2013-04-25 MED ORDER — FLECAINIDE ACETATE 100 MG PO TABS
100.0000 mg | ORAL_TABLET | Freq: Two times a day (BID) | ORAL | Status: DC
  Administered 2013-04-25 – 2013-04-29 (×8): 100 mg via ORAL
  Filled 2013-04-25 (×10): qty 1

## 2013-04-25 MED ORDER — ATORVASTATIN CALCIUM 40 MG PO TABS
40.0000 mg | ORAL_TABLET | Freq: Every day | ORAL | Status: DC
  Administered 2013-04-26 – 2013-04-28 (×3): 40 mg via ORAL
  Filled 2013-04-25 (×5): qty 1

## 2013-04-25 MED ORDER — EPHEDRINE SULFATE 50 MG/ML IJ SOLN
INTRAMUSCULAR | Status: DC | PRN
  Administered 2013-04-25: 10 mg via INTRAVENOUS

## 2013-04-25 MED ORDER — OXYCODONE HCL 5 MG PO TABS: 5.0000 mg | ORAL_TABLET | Freq: Once | ORAL | Status: DC | PRN

## 2013-04-25 MED ORDER — ONDANSETRON HCL 4 MG/2ML IJ SOLN: 4.0000 mg | Freq: Four times a day (QID) | INTRAMUSCULAR | Status: DC | PRN

## 2013-04-25 MED ORDER — OXYCODONE-ACETAMINOPHEN 5-325 MG PO TABS
1.0000 | ORAL_TABLET | ORAL | Status: DC | PRN
  Administered 2013-04-28 – 2013-04-29 (×2): 2 via ORAL
  Filled 2013-04-25 (×2): qty 2

## 2013-04-25 MED ORDER — HYDROMORPHONE HCL PF 1 MG/ML IJ SOLN
INTRAMUSCULAR | Status: AC
  Filled 2013-04-25: qty 1

## 2013-04-25 MED ORDER — OXYCODONE HCL 5 MG PO TABS
5.0000 mg | ORAL_TABLET | ORAL | Status: AC | PRN
  Administered 2013-04-25 – 2013-04-26 (×3): 10 mg via ORAL
  Filled 2013-04-25 (×3): qty 2

## 2013-04-25 MED ORDER — VITAMIN D 1000 UNITS PO TABS
2000.0000 [IU] | ORAL_TABLET | Freq: Every day | ORAL | Status: DC
  Administered 2013-04-26 – 2013-04-29 (×4): 2000 [IU] via ORAL
  Filled 2013-04-25 (×5): qty 2

## 2013-04-25 MED ORDER — MIDAZOLAM HCL 5 MG/5ML IJ SOLN
INTRAMUSCULAR | Status: DC | PRN
  Administered 2013-04-25: 2 mg via INTRAVENOUS

## 2013-04-25 MED ORDER — POTASSIUM CHLORIDE 10 MEQ/50ML IV SOLN
10.0000 meq | Freq: Every day | INTRAVENOUS | Status: DC | PRN
  Filled 2013-04-25: qty 50

## 2013-04-25 MED ORDER — KCL IN DEXTROSE-NACL 20-5-0.45 MEQ/L-%-% IV SOLN
INTRAVENOUS | Status: DC
  Administered 2013-04-25: 50 mL/h via INTRAVENOUS
  Filled 2013-04-25 (×2): qty 1000

## 2013-04-25 MED ORDER — BISACODYL 5 MG PO TBEC
10.0000 mg | DELAYED_RELEASE_TABLET | Freq: Every day | ORAL | Status: DC
  Administered 2013-04-26 – 2013-04-29 (×4): 10 mg via ORAL
  Filled 2013-04-25 (×4): qty 2

## 2013-04-25 MED ORDER — MORPHINE SULFATE 2 MG/ML IJ SOLN
2.0000 mg | INTRAMUSCULAR | Status: DC | PRN
  Administered 2013-04-25: 2 mg via INTRAVENOUS
  Filled 2013-04-25: qty 1

## 2013-04-25 MED ORDER — OXYCODONE HCL 5 MG/5ML PO SOLN: 5.0000 mg | Freq: Once | ORAL | Status: DC | PRN

## 2013-04-25 MED ORDER — TRAMADOL HCL 50 MG PO TABS
50.0000 mg | ORAL_TABLET | Freq: Four times a day (QID) | ORAL | Status: DC | PRN
  Administered 2013-04-26 – 2013-04-28 (×8): 100 mg via ORAL
  Filled 2013-04-25 (×8): qty 2

## 2013-04-25 MED ORDER — ONDANSETRON HCL 4 MG/2ML IJ SOLN: 4.0000 mg | Freq: Once | INTRAMUSCULAR | Status: DC | PRN

## 2013-04-25 MED ORDER — LACTATED RINGERS IV SOLN
INTRAVENOUS | Status: DC
  Administered 2013-04-25: 10:00:00 via INTRAVENOUS

## 2013-04-25 MED ORDER — PHENYLEPHRINE HCL 10 MG/ML IJ SOLN
INTRAMUSCULAR | Status: DC | PRN
  Administered 2013-04-25: 40 ug via INTRAVENOUS

## 2013-04-25 MED ORDER — GLYCOPYRROLATE 0.2 MG/ML IJ SOLN
INTRAMUSCULAR | Status: DC | PRN
  Administered 2013-04-25: .8 mg via INTRAVENOUS

## 2013-04-25 MED ORDER — FENTANYL CITRATE 0.05 MG/ML IJ SOLN
INTRAMUSCULAR | Status: DC | PRN
  Administered 2013-04-25 (×3): 100 ug via INTRAVENOUS
  Administered 2013-04-25: 50 ug via INTRAVENOUS
  Administered 2013-04-25: 100 ug via INTRAVENOUS
  Administered 2013-04-25: 50 ug via INTRAVENOUS

## 2013-04-25 MED ORDER — ROCURONIUM BROMIDE 100 MG/10ML IV SOLN
INTRAVENOUS | Status: DC | PRN
  Administered 2013-04-25: 50 mg via INTRAVENOUS

## 2013-04-25 MED ORDER — MIDAZOLAM HCL 2 MG/2ML IJ SOLN
1.0000 mg | INTRAMUSCULAR | Status: DC | PRN
Start: 2013-04-25 — End: 2013-04-25
  Administered 2013-04-25: 2 mg via INTRAVENOUS
  Administered 2013-04-25: 1 mg via INTRAVENOUS
  Filled 2013-04-25 (×2): qty 2

## 2013-04-25 MED ORDER — PROPOFOL 10 MG/ML IV BOLUS
INTRAVENOUS | Status: DC | PRN
  Administered 2013-04-25: 50 mg via INTRAVENOUS
  Administered 2013-04-25: 120 mg via INTRAVENOUS
  Administered 2013-04-25: 30 mg via INTRAVENOUS

## 2013-04-25 MED ORDER — HYDROMORPHONE HCL PF 1 MG/ML IJ SOLN
0.2500 mg | INTRAMUSCULAR | Status: DC | PRN
  Administered 2013-04-25 (×2): 0.5 mg via INTRAVENOUS

## 2013-04-25 MED ORDER — METOPROLOL SUCCINATE ER 100 MG PO TB24
100.0000 mg | ORAL_TABLET | Freq: Every day | ORAL | Status: DC
  Administered 2013-04-25 – 2013-04-28 (×4): 100 mg via ORAL
  Filled 2013-04-25 (×5): qty 1

## 2013-04-25 MED ORDER — NEOSTIGMINE METHYLSULFATE 1 MG/ML IJ SOLN
INTRAMUSCULAR | Status: DC | PRN
  Administered 2013-04-25: 5 mg via INTRAVENOUS

## 2013-04-25 MED ORDER — FENTANYL CITRATE 0.05 MG/ML IJ SOLN
50.0000 ug | INTRAMUSCULAR | Status: DC | PRN
  Filled 2013-04-25: qty 2

## 2013-04-25 MED ORDER — LORAZEPAM 0.5 MG PO TABS
0.5000 mg | ORAL_TABLET | Freq: Every day | ORAL | Status: DC
  Administered 2013-04-25 – 2013-04-28 (×4): 0.5 mg via ORAL
  Filled 2013-04-25 (×4): qty 1

## 2013-04-25 MED ORDER — ACETAMINOPHEN 160 MG/5ML PO SOLN
325.0000 mg | ORAL | Status: DC | PRN
  Filled 2013-04-25: qty 20.3

## 2013-04-25 MED ORDER — ACETAMINOPHEN 325 MG PO TABS: 325.0000 mg | ORAL_TABLET | ORAL | Status: DC | PRN

## 2013-04-25 MED ORDER — SENNOSIDES-DOCUSATE SODIUM 8.6-50 MG PO TABS
1.0000 | ORAL_TABLET | Freq: Every evening | ORAL | Status: DC | PRN
  Filled 2013-04-25: qty 1

## 2013-04-25 MED ORDER — 0.9 % SODIUM CHLORIDE (POUR BTL) OPTIME
TOPICAL | Status: DC | PRN
  Administered 2013-04-25: 2000 mL

## 2013-04-25 MED ORDER — ONDANSETRON HCL 4 MG/2ML IJ SOLN
INTRAMUSCULAR | Status: DC | PRN
  Administered 2013-04-25: 4 mg via INTRAVENOUS

## 2013-04-25 MED ORDER — PANTOPRAZOLE SODIUM 40 MG PO TBEC
40.0000 mg | DELAYED_RELEASE_TABLET | Freq: Every day | ORAL | Status: DC
  Administered 2013-04-26 – 2013-04-29 (×4): 40 mg via ORAL
  Filled 2013-04-25 (×5): qty 1

## 2013-04-25 MED ORDER — FINASTERIDE 1 MG PO TABS: 1.0000 mg | ORAL_TABLET | Freq: Every morning | ORAL | Status: DC

## 2013-04-25 SURGICAL SUPPLY — 50 items
ATTRACTOMAT 16X20 MAGNETIC DRP (DRAPES) ×3 IMPLANT
BLADE SURG ROTATE 9660 (MISCELLANEOUS) ×3 IMPLANT
CANISTER SUCTION 2500CC (MISCELLANEOUS) ×3 IMPLANT
CATH THORACIC 28FR (CATHETERS) IMPLANT
CATH THORACIC 28FR RT ANG (CATHETERS) IMPLANT
CATH THORACIC 36FR (CATHETERS) IMPLANT
CATH THORACIC 36FR RT ANG (CATHETERS) IMPLANT
CLIP TI MEDIUM 6 (CLIP) ×3 IMPLANT
CLIP TI WIDE RED SMALL 6 (CLIP) ×3 IMPLANT
CONT SPEC 4OZ CLIKSEAL STRL BL (MISCELLANEOUS) ×6 IMPLANT
COVER SURGICAL LIGHT HANDLE (MISCELLANEOUS) ×6 IMPLANT
DRAIN CHANNEL 28F RND 3/8 FF (WOUND CARE) IMPLANT
DRAIN CHANNEL 32F RND 10.7 FF (WOUND CARE) ×3 IMPLANT
DRAPE LAPAROSCOPIC ABDOMINAL (DRAPES) ×3 IMPLANT
ELECT REM PT RETURN 9FT ADLT (ELECTROSURGICAL) ×3
ELECTRODE REM PT RTRN 9FT ADLT (ELECTROSURGICAL) ×2 IMPLANT
GLOVE BIO SURGEON STRL SZ 6.5 (GLOVE) ×3 IMPLANT
GLOVE BIO SURGEON STRL SZ7.5 (GLOVE) ×3 IMPLANT
GLOVE BIOGEL PI IND STRL 6.5 (GLOVE) ×2 IMPLANT
GLOVE BIOGEL PI INDICATOR 6.5 (GLOVE) ×1
GLOVE EUDERMIC 7 POWDERFREE (GLOVE) ×3 IMPLANT
GOWN PREVENTION PLUS XLARGE (GOWN DISPOSABLE) ×6 IMPLANT
GOWN STRL REUS W/ TWL LRG LVL3 (GOWN DISPOSABLE) ×2 IMPLANT
GOWN STRL REUS W/TWL LRG LVL3 (GOWN DISPOSABLE) ×1
HEMOSTAT POWDER SURGIFOAM 1G (HEMOSTASIS) IMPLANT
KIT BASIN OR (CUSTOM PROCEDURE TRAY) ×3 IMPLANT
KIT ROOM TURNOVER OR (KITS) ×3 IMPLANT
NS IRRIG 1000ML POUR BTL (IV SOLUTION) ×6 IMPLANT
PACK CHEST (CUSTOM PROCEDURE TRAY) ×3 IMPLANT
PAD ARMBOARD 7.5X6 YLW CONV (MISCELLANEOUS) ×6 IMPLANT
PAD ELECT DEFIB RADIOL ZOLL (MISCELLANEOUS) ×3 IMPLANT
SPONGE GAUZE 4X4 12PLY (GAUZE/BANDAGES/DRESSINGS) ×3 IMPLANT
SUT SILK  1 MH (SUTURE) ×1
SUT SILK 1 MH (SUTURE) ×2 IMPLANT
SUT VIC AB 1 CTX 18 (SUTURE) ×3 IMPLANT
SUT VIC AB 2-0 CT1 27 (SUTURE) ×1
SUT VIC AB 2-0 CT1 TAPERPNT 27 (SUTURE) ×2 IMPLANT
SUT VIC AB 3-0 X1 27 (SUTURE) ×6 IMPLANT
SWAB COLLECTION DEVICE MRSA (MISCELLANEOUS) IMPLANT
SYR 50ML SLIP (SYRINGE) IMPLANT
SYRINGE 10CC LL (SYRINGE) IMPLANT
SYSTEM SAHARA CHEST DRAIN ATS (WOUND CARE) IMPLANT
TAPE CLOTH SURG 4X10 WHT LF (GAUZE/BANDAGES/DRESSINGS) ×3 IMPLANT
TOWEL OR 17X24 6PK STRL BLUE (TOWEL DISPOSABLE) ×6 IMPLANT
TOWEL OR 17X26 10 PK STRL BLUE (TOWEL DISPOSABLE) ×3 IMPLANT
TRAP SPECIMEN MUCOUS 40CC (MISCELLANEOUS) ×3 IMPLANT
TRAY FOLEY CATH 14FRSI W/METER (CATHETERS) IMPLANT
TRAY FOLEY CATH 16FRSI W/METER (SET/KITS/TRAYS/PACK) ×3 IMPLANT
TUBE ANAEROBIC SPECIMEN COL (MISCELLANEOUS) IMPLANT
WATER STERILE IRR 1000ML POUR (IV SOLUTION) ×3 IMPLANT

## 2013-04-25 NOTE — Transfer of Care (Signed)
Immediate Anesthesia Transfer of Care Note  Patient: [name] [name]  Procedure(s) Performed: Procedure(s): SUBXYPHOID PERICARDIAL WINDOW (N/A)  Patient Location: PACU  Anesthesia Type:General  Level of Consciousness: awake, alert  and oriented  Airway & Oxygen Therapy: Patient Spontanous Breathing and Patient connected to nasal cannula oxygen  Post-op Assessment: Report given to PACU RN  Post vital signs: Reviewed and stable  Complications: No apparent anesthesia complications

## 2013-04-25 NOTE — Op Note (Signed)
CARDIOVASCULAR SURGERY OPERATIVE NOTE  04/25/2013  Surgeon:  Gaye Pollack, MD  First Assistant: Andreas Blower,  [facility]   Preoperative Diagnosis:  Pericardial Effusion   Postoperative Diagnosis:  Same   Procedure:  1. 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 He also has a history of PAF and underwent 2 ablation procedures in 2002 and 2009. He has been known to have a pericardial effusion since 2007. An echo in 2012 showed a small effusion. He had been maintaining sinus rhythm on Flecainide and anticoagulated with Pradaxa until he returned from [place] after spending last summer there producing a play. He developed recurrent A-fib and was cardioverted on 02/08/2013. He now reports progressive shortness of breath and lower extremity edema. An echo on 03/06/2013 showed a moderate pericardial effusion that had increased from his prior echo. There was no sign of tamponade. His diuretic was increased. A CT scan of the chest ruled out PE. There was borderline enlargement of the ascending aorta at 4.0 cm. It showed a moderate concentric pericardial effusion. There were some small cysts in the liver and a 5 mm rounded hyperenhancing mass in the left lobe of the liver of unclear significance. He subsequently underwent R and L heart cath on 03/17/2013 which showed no significant coronary disease. LV function was preserved at 50-55%. There was no hemodynamic evidence of constrictive pericarditis and his pericardium did not look thickened on CT. He has a moderate pericardial effusion with shortness of breath, orthopnea and lower extremity edema. He has had a small effusion documented since 2007 and it was still present in 2012 and clearly a little larger now. There  is no evidence of hemodynamic compromise by echo or vital signs and no signs of constrictive pericarditis. I think it would be best to drain this effusion by subxyphoid pericardial window to see if that resolves his symptoms. This would also allow diagnostic testing of the fluid and the pericardium although I suspect this will be idiopathic. I discussed the procedure with him including alternatives, benefits and risks including but not limited to bleeding, infection, recurrence of the effusion and the possibility that it may not resolve his symptoms. He understands and would like to proceed with surgical drainage.    Preparation:  The patient was seen in the preoperative holding area and the correct patient, correct operation were confirmed with the patient after reviewing the medical record and xray studies. The consent was signed by me. Preoperative antibiotics were given. A radial arterial line were placed by the anesthesia team. The patient was taken back to the operating room and positioned supine on the operating room table. After being placed under general endotracheal anesthesia by the anesthesia team a foley catheter was placed. The neck, chest, and abdomen were prepped with betadine soap and solution and draped in the usual sterile manner. A surgical time-out was taken and the correct patient and operative procedure were confirmed with the nursing and anesthesia staff.  Sub-xyphoid pericardial window:  A vertical midline incision was made over the xyphoid process into the epigastrium. The subcutaneous tissue was divided with cautery. The midline fascia was divided and the sub-xyphoid space entered. The pericardium was identified. A small opening was made and there was clear serous fluid released. There was about  300 cc of fluid. A sample was sent for cytologic exam, a sample for routine culture, and a pericardial sample for pathologic exam. There was no hemodynamic change with drainage of the  fluid. There pericardium and heart looked grossly normal with no visible signs of inflammation. A 32 F Bard drain was brought through a separate incision and positioned in the posterior pericardium. The midline fascia was approximated with interrupted 0-vicryl sutures. The subcutaneous tissue was closed with continuous 2-0 vicryl suture, and the skin with 3-0 vicryl subcuticular closure. The sponge, needle, and instrument counts were correct according to the nurses.   Dry sterile dressings were placed over the incisions and around the chest tube which was connected to pleurevac suction. The patient was then transported to the surgical intensive care unit in critical but stable condition.

## 2013-04-25 NOTE — Anesthesia Procedure Notes (Addendum)
Procedure Name: Intubation Date/Time: 04/25/2013 12:58 PM Performed by: Barrington Ellison Pre-anesthesia Checklist: Patient identified, Emergency Drugs available, Suction available, Patient being monitored and Timeout performed Patient Re-evaluated:Patient Re-evaluated prior to inductionOxygen Delivery Method: Circle system utilized Preoxygenation: Pre-oxygenation with 100% oxygen Intubation Type: IV induction Ventilation: Two handed mask ventilation required Laryngoscope Size: Mac and 3 Grade View: Grade II Tube type: Oral Tube size: 7.5 mm Number of attempts: 2 Airway Equipment and Method: Stylet Placement Confirmation: ETT inserted through vocal cords under direct vision,  positive ETCO2 and breath sounds checked- equal and bilateral Secured at: 22 cm Tube secured with: Tape Dental Injury: Teeth and Oropharynx as per pre-operative assessment  Difficulty Due To: Difficulty was unanticipated Comments: DL x 1 with Sabra Heck 3 by Reyes Ivan with no view, DL x 1 with Mac 3 by CRNA. Grade 2 view.   Anesthesia Procedure Note Anesthesia Procedure Note

## 2013-04-25 NOTE — Anesthesia Preprocedure Evaluation (Addendum)
Anesthesia Evaluation  Patient identified by MRN, date of birth, ID band Patient awake    Reviewed: Allergy & Precautions, H&P , NPO status , Patient's Chart, lab work & pertinent test results  History of Anesthesia Complications Negative for: history of anesthetic complications  Airway Mallampati: II TM Distance: >3 FB Neck ROM: Full    Dental  (+) Teeth Intact and Dental Advisory Given   Pulmonary shortness of breath and at rest, sleep apnea and Continuous Positive Airway Pressure Ventilation , former smoker,    Pulmonary exam normal       Cardiovascular hypertension, Pt. on home beta blockers + Peripheral Vascular Disease and +CHF - CAD + dysrhythmias Atrial Fibrillation Rhythm:Regular Rate:Normal  H/o viral cardiomyopathy in '90s with residual effusion, effusion worsened and now symptomatic, ef normal    Neuro/Psych PSYCHIATRIC DISORDERS Anxiety Depression    GI/Hepatic negative GI ROS, Neg liver ROS,   Endo/Other  negative endocrine ROS  Renal/GU negative Renal ROS  negative genitourinary   Musculoskeletal   Abdominal   Peds  Hematology Pradaxa for afib last took 1/16   Anesthesia Other Findings   Reproductive/Obstetrics                          Anesthesia Physical Anesthesia Plan  ASA: IV  Anesthesia Plan: General   Post-op Pain Management:    Induction: Intravenous  Airway Management Planned: Oral ETT  Additional Equipment: Arterial line, TEE, CVP and Ultrasound Guidance Line Placement  Intra-op Plan:   Post-operative Plan: Extubation in OR  Informed Consent: I have reviewed the patients History and Physical, chart, labs and discussed the procedure including the risks, benefits and alternatives for the proposed anesthesia with the patient or authorized representative who has indicated his/her understanding and acceptance.   Dental advisory given  Plan Discussed with: CRNA  and Surgeon  Anesthesia Plan Comments:         Anesthesia Quick Evaluation

## 2013-04-25 NOTE — Anesthesia Postprocedure Evaluation (Signed)
  Anesthesia Post-op Note  Patient: [name] [name]  Procedure(s) Performed: Procedure(s): SUBXYPHOID PERICARDIAL WINDOW (N/A)  Patient Location: PACU  Anesthesia Type:General  Level of Consciousness: awake, alert  and oriented  Airway and Oxygen Therapy: Patient Spontanous Breathing and Patient connected to nasal cannula oxygen  Post-op Pain: mild  Post-op Assessment: Post-op Vital signs reviewed, Patient's Cardiovascular Status Stable, Respiratory Function Stable, Patent Airway, No signs of Nausea or vomiting and Pain level controlled  Post-op Vital Signs: Reviewed and stable  Complications: No apparent anesthesia complications

## 2013-04-25 NOTE — H&P (Signed)
[address]       [name] [address]             [phone]      Cardiothoracic Surgery History and Physical   PCP is [name] Evener, MD  Referring Provider is [name] Sine, MD  Chief Complaint   Patient presents with   .  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 He also has a history of PAF and underwent 2 ablation procedures in 2002 and 2009. He has been known to have a pericardial effusion since 2007. An echo in 2012 showed a small effusion. He had been maintaining sinus rhythm on Flecainide and anticoagulated with Pradaxa until he returned from [place] after spending last summer there producing a play. He developed recurrent A-fib and was cardioverted on 02/08/2013. He now reports progressive shortness of breath and lower extremity edema. An echo on 03/06/2013 showed a moderate pericardial effusion that had increased from his prior echo. There was no sign of tamponade. His diuretic was increased. A CT scan of the chest ruled out PE. There was borderline enlargement of the ascending aorta at 4.0 cm. It showed a moderate concentric pericardial effusion. There were some small cysts in the liver and a 5 mm rounded hyperenhancing mass in the left lobe of the liver of unclear significance. He subsequently underwent R and L heart cath on 03/17/2013 which showed no significant coronary disease. LV function was preserved at 50-55%. There was no hemodynamic evidence of constrictive pericarditis and his pericardium did not look thickened on CT.   Past Medical History   Diagnosis  Date   .  Paroxysmal atrial fibrillation      s/p afib 2001 and 2009 by [name]   .  Coronary artery disease      no significant CAD by cath 5/12   .  Cardiomyopathy     hx of tachycardia mediated CM, now resolved   .  Hypertension    .  Hyperlipidemia    .  Thoracic aortic aneurysm    .  Depression    .  Sleep apnea      AHI avg 5/hr   .  Limb pain  06/20/2007     LE venous doppler - no evidence of thrombus or thrombophlebitis   .  PVC (premature ventricular contraction)  12/18/2009     cardionet monitor 21 days - some PVCs, ventricular bigeminy   .  Pericardial effusion    .  Echocardiogram abnormal      EF60%, mild LVH, PA pk pressure 35, pericardial effusion mild increase from 2012    Past Surgical History   Procedure  Laterality  Date   .  Cardiac catheterization   08/25/10     NO SIGNIFICANT CAD   .  Cardioversion   10/18/2007     successful   .  Elbow / upper arm foreign body removal     .  Incision and drainage of wound   09/07/2011     Procedure: IRRIGATION AND DEBRIDEMENT WOUND; Surgeon: [name] Salen, MD; Location: [name]; Service: Orthopedics; Laterality: Left;   .  Transesophageal echocardiogram   08/26/2010     see Notes tab   .  Cardiovascular stress test   09/08/2005     R/S MV - EF 61%; Exercises capacity 12 Mets; essentially normal perfusion myocardial scan   .  Cardiac electrophysiology mapping and ablation   [id number]     [name]   .  Osteosynthesis  Left  09/30/2012     with small plate in the ulna   .  Cardioversion  N/A  02/08/2013     Procedure: CARDIOVERSION; Surgeon: [name] Sine, MD; Location: [name]; Service: Cardiovascular; Laterality: N/A;    Family History   Problem  Relation  Age of Onset   .  Coronary artery disease  Father    .  Stroke  Mother      died   .  Atrial fibrillation  Brother    .  Heart attack  Father    .  Mitral valve prolapse  Sister    Social History  History   Substance Use Topics   .  Smoking status:  Former Smoker -- 1.00 packs/day for 10 years     Quit date:  02/06/1989   .  Smokeless tobacco:  Never Used   .  Alcohol Use:  Yes      Comment: 2 bottles of wine per week     Current Outpatient Prescriptions   Medication  Sig  Dispense  Refill   .  atorvastatin (LIPITOR) 40 MG tablet  Take 40 mg by mouth daily.     .  Cholecalciferol (VITAMIN D) 2000 UNITS CAPS  Take 2,000 Units by mouth daily.     .  dabigatran (PRADAXA) 150 MG CAPS  Take 150 mg by mouth every 12 (twelve) hours.     .  finasteride (PROPECIA) 1 MG tablet  Take 1 mg by mouth every morning.     .  flecainide (TAMBOCOR) 100 MG tablet  Take 100 mg by mouth 2 (two) times daily.     .  furosemide (LASIX) 40 MG tablet  Take 2 tablets (80 mg total) by mouth daily. Take another 40 mg in the evening  75 tablet  6   .  LORazepam (ATIVAN) 0.5 MG tablet  Take 0.5 mg by mouth at bedtime.     .  metoprolol succinate (TOPROL-XL) 100 MG 24 hr tablet  Take 1 tablet (100 mg total) by mouth daily. Take with or immediately following a meal.  90 tablet  4   .  pantoprazole (PROTONIX) 40 MG tablet  Take one tab twice daily for one week, then once daily for silent reflux  40 tablet  1   .  potassium chloride SA (K-DUR,KLOR-CON) 20 MEQ tablet  Take 1.5 tablets (30 mEq total) by mouth daily.  45 tablet  6   .  Saw Palmetto, Serenoa repens, (SAW PALMETTO PO)  Take 1 tablet by mouth 2 (two) times daily.      No current facility-administered medications for this visit.    Allergies   Allergen  Reactions   .  Tetracyclines & Related  Hives    Review of Systems   Constitutional: Negative for fever, chills, activity change and fatigue.  HENT: Negative.  Eyes: Negative.  Respiratory: Positive for shortness of breath. Negative for chest tightness.  Orthopnea  Cardiovascular: Positive for palpitations and leg swelling. Negative for chest pain.  Gastrointestinal: Negative.  Endocrine: Negative.  Genitourinary: Negative.  Musculoskeletal: Negative for gait problem and myalgias.  Recent ORIF of left forearm ulnar and radial fracture in [place] after  a fall. He still needs to have a Carpal Tunnel release.  Skin:  Negative.  Allergic/Immunologic: Negative.  Neurological: Negative for dizziness and syncope.  Hematological: Negative.  Psychiatric/Behavioral: Negative.    BP 132/83  Pulse 74  Resp 16  Ht 6\' 2"  (1.88 m)  Wt 218 lb (98.884 kg)  BMI 27.98 kg/m2  SpO2 96%  Physical Exam   Constitutional: He is oriented to person, place, and time. He appears well-developed and well-nourished. No distress.  HENT:  Head: Normocephalic and atraumatic.  Mouth/Throat: Oropharynx is clear and moist.  Eyes: EOM are normal. Pupils are equal, round, and reactive to light.  Neck: Normal range of motion. Neck supple. No JVD present. No thyromegaly present.  Cardiovascular: Normal rate, regular rhythm, normal heart sounds and intact distal pulses.  No murmur heard.  Pulmonary/Chest: Effort normal and breath sounds normal. No respiratory distress. He has no wheezes. He has no rales.  Abdominal: Soft. Bowel sounds are normal. He exhibits no distension and no mass. There is no tenderness.  Musculoskeletal:  Mild bilateral lower leg edema.  Lymphadenopathy:  He has no cervical adenopathy.  Neurological: He is alert and oriented to person, place, and time. No cranial nerve deficit.  Normal strength in right upper and bilateral lower extremities. Left upper extremity in a brace.    Diagnostic Tests:  [facility] [address], [place] [address] [phone]  ------------------------------------------------------------ Transthoracic Echocardiography  Patient: [name], [name] MR #: [id number] Study Date: 03/06/2013 Gender: M Age: 76 Height: 193cm Weight: 97.1kg BSA: 2.5m^2 Pt. Status: Room:  ATTENDING Thompson Grayer, MD ORDERING Thompson Grayer, MD [place], MD PERFORMING [facility] SONOGRAPHER Victorio Palm, RDCS cc:  ------------------------------------------------------------ LV EF:  60%  ------------------------------------------------------------ Indications: Atrial fibrillation - 427.31. Edema 782.3. Shortness of breath 786.05.  ------------------------------------------------------------ History: PMH: Acquired from the patient and from the patient's chart. Orthopnea, dyspnea, cough, and bilateral lower extremity edema. Atrial fibrillation. Cardiomyopathy.  ------------------------------------------------------------ Study Conclusions  - Left ventricle: The cavity size was normal. Wall thickness was increased in a pattern of mild LVH. The estimated ejection fraction was 60%. Wall motion was normal; there were no regional wall motion abnormalities. - Left atrium: The atrium was mildly dilated. - Right ventricle: Systolic function was mildly reduced. - Pulmonary arteries: PA peak pressure: 41mm Hg (S). - Pericardium, extracardiac: There is a Moderate Circumferential Pericardial Effusion,. There is no tamponade. There was a circumferential effusion May, 2012 echo. Currently, the effusion may be a little larger.  ------------------------------------------------------------ Labs, prior tests, procedures, and surgery: Transesophageal echocardiography (2009). The mitral valve showed mild regurgitation. The aortic valve showed mild regurgitation. EF was 55%.  Transthoracic echocardiography. M-mode, complete 2D, spectral Doppler, and color Doppler. Height: Height: 193cm. Height: 76in. Weight: Weight: 97.1kg. Weight: 213.6lb. Body mass index: BMI: 26kg/m^2. Body surface area: BSA: 2.19m^2. Blood pressure: 120/75. Patient status: Outpatient. Location: [facility]  ------------------------------------------------------------  ------------------------------------------------------------ Left ventricle: The cavity size was normal. Wall thickness was increased in a pattern of mild LVH. The estimated ejection fraction was 60%. Wall motion was normal;  there were no regional wall motion abnormalities.  ------------------------------------------------------------ Aortic valve: Structurally normal valve. Cusp separation was normal. Doppler: Transvalvular velocity was within the normal range. There was no stenosis. No regurgitation.  ------------------------------------------------------------ Aorta: Aortic root: The aortic root was normal in size.  ------------------------------------------------------------ Mitral valve: Structurally normal valve. Leaflet separation was normal. Doppler: Transvalvular velocity was within the normal range. There was no evidence for  stenosis. No regurgitation. Peak gradient: 18mm Hg (D).  ------------------------------------------------------------ Left atrium: The atrium was mildly dilated.  ------------------------------------------------------------ Right ventricle: The cavity size was normal. Wall thickness was normal. Systolic function was mildly reduced.  ------------------------------------------------------------ Pulmonic valve: The valve appears to be grossly normal. Doppler: No significant regurgitation.  ------------------------------------------------------------ Tricuspid valve: Structurally normal valve. Leaflet separation was normal. Doppler: Transvalvular velocity was within the normal range. Mild regurgitation.  ------------------------------------------------------------ Right atrium: The atrium was at the upper limits of normal in size.  ------------------------------------------------------------ Pericardium: There is a Moderate Circumferential Pericardial Effusion,. There is no tamponade.  ------------------------------------------------------------  2D measurements Normal Doppler measurements Normal Left ventricle Main pulmonary LVID ED, 48.8 mm 43-52 artery chord, Pressure, 35 mm Hg =30 PLAX S LVID ES, 32.5 mm 23-38 Left ventricle chord, Ea, lat 5.81 cm/s  ------ PLAX ann, tiss FS, chord, 33 % >29 DP PLAX E/Ea, lat 14.18 ------ LVPW, ED 9.61 mm ------ ann, tiss IVS/LVPW 1.44 <1.3 DP ratio, ED Ea, med 4.61 cm/s ------ Vol ED, 117 ml ------ ann, tiss MOD1 DP Vol ES, 48 ml ------ E/Ea, med 17.87 ------ MOD1 ann, tiss EF, MOD1 59 % ------ DP Vol index, 51 ml/m^2 ------ LVOT ED, MOD1 Peak vel, 85.7 cm/s ------ Vol index, 21 ml/m^2 ------ S ES, MOD1 VTI, S 18.6 cm ------ Vol ED, 112 ml ------ Stroke vol 91.3 ml ------ MOD2 Stroke 40 ml/m^ ------ Vol ES, 45 ml ------ index 2 MOD2 Mitral valve EF, MOD2 60 % ------ Peak E vel 82.4 cm/s ------ Stroke 67 ml ------ Peak A vel 46.4 cm/s ------ vol, MOD2 Decelerati 225 ms 150-23 Vol index, 49 ml/m^2 ------ on time 0 ED, MOD2 Peak 3 mm Hg ------ Vol index, 20 ml/m^2 ------ gradient, ES, MOD2 D Stroke 29.4 ml/m^2 ------ Peak E/A 1.8 ------ index, ratio MOD2 Tricuspid valve Ventricular septum Regurg 274 cm/s ------ IVS, ED 13.8 mm ------ peak vel LVOT Peak RV-RA 30 mm Hg ------ Diam, S 25 mm ------ gradient, Area 4.91 cm^2 ------ S Diam 25 mm ------ Systemic veins Aorta Estimated 5 mm Hg ------ Root diam, 45 mm ------ CVP ED Right ventricle Left atrium Pressure, 35 mm Hg <30 AP dim 44 mm ------ S AP dim 1.93 cm/m^2 <2.2 Sa vel, 9.54 cm/s ------ index lat ann, tiss DP  ------------------------------------------------------------ Prepared and Electronically Authenticated by  Dola Argyle 2014-12-01T13:16:00.840  CLINICAL DATA: Shortness of breath. Ascending thoracic aortic  aneurysm. Pericardial effusion.  EXAM:  CT ANGIOGRAPHY CHEST WITH CONTRAST  TECHNIQUE:  Multidetector CT imaging of the chest was performed using the  standard protocol during bolus administration of intravenous  contrast. Multiplanar CT image reconstructions including MIPs were  obtained to evaluate the vascular anatomy.  CONTRAST: 11mL OMNIPAQUE IOHEXOL 350 MG/ML SOLN  COMPARISON: Chest radiographs  obtained earlier today and chest CT  dated 09/17/2005.  FINDINGS:  The ascending thoracic aorta remains borderline enlarged, measuring  4.0 cm in maximum transverse diameter on image number 158 of series  5. Moderate sized pericardial effusion measuring 17.5 mm in  thickness on image number 79 of series 4. Normally opacified  pulmonary arteries. No pulmonary arterial filling defects are seen.  Clear lungs. No lung nodules or enlarged lymph nodes. 8 mm probable  cyst in the superior aspect of the lateral segment of the left lobe  of the liver on image number 85 and 11 mm probable cyst in the  lateral segment of the left lobe of the liver on image number 93,  both mildly  increased in size since the previous examination. There  is also a 5 mm rounded hyper enhancing mass in the anterior aspect  of the lateral segment of the left lobe of the liver on image number  89, not definitely seen previously. Thoracic spine degenerative  changes.  Review of the MIP images confirms the above findings.  IMPRESSION:  1. Moderate-sized pericardial effusion, increased in size.  2. Stable borderline aneurysmal dilatation of the ascending thoracic  aorta.  3. No pulmonary emboli.  4. Small probable cysts in the liver and small hyper enhancing liver  mass, as described above.  Electronically Signed  By: Enrique Sack M.D.  On: 03/10/2013 15:50    Cardiac Cath:  PROCEDURE:  The patient was brought to the second floor [facility] cath lab in the postabsorptive state. His right femoral artery and right femoral vein were punctured and a 7 French venous sheath and 5 French arterial sheath were inserted without difficulty. A Swan-Ganz catheter was advanced to the right atrium, and care for pressure monitoring was performed with fasting speed evaluate waveform contour. A 5 French pigtail catheter was inserted into the right femoral artery sheath and advanced to the central aorta.  The catheter was then  advanced into the right ventricle and right ventricular pressure was recorded. The pigtail catheter was advanced into the left ventricle and anus left ventricular and right ventricular pressures were recorded. At this time, the patient did appear to be dry and had inadvertently taking his morning dose of Lasix. Lahey was treated with fluid bolus. He catheter was then advanced to the pulmonary artery oxygen saturation was obtained. Poor capillary wedge pressure was then recorded as was simultaneous LV and PC pressures. The Luiz Blare was then advanced back into the pulmonary artery and cardiac output measurements were obtained which gave additional 60 cc fluid bolus. The Swan catheter was then pulled back into the right ventricle and hemodynamic simultaneous recording was again made of the left ventricular and right ventricular pressures. Where as initially PE BP was very low and equal following fluid challenge the LVEDP was now greater than the RVEDP arguing against constrictive hemodynamics. Completion of the right heart pullback was then performed. Left ventriculography was done with 25 cc of contrast at 12 cc per second. An LV to AO pullback was performed. Attention was then directed at the coronary arteries and diagnostic catheterization was done utilizing 5 French Judkins 4 left and right artery catheters. No stasis bite was obtained by direct manual pressure. The patient tolerated the procedure well.  HEMODYNAMICS:   Right Atrium: Mean pressure 6. There was no evidence for a rapid Y descent in early diastole arguing against constrictive hemodynamics.  Right ventricle: 27/4 , post a wave 7  Simultaneous Left ventricular pressure 110/5 post a wave 11  PA pressure 28/10, mean 18, capillary wedge pressure mean 10  Following a fluid challenge:  Left ventricular pressure 106/11/14  Right ventricular pressure: 32/2/7  Right atrial pressure: 7/5/5  No evidence for pulsus paradoxius or cardiac tamponade  hemodynamics.  Pullback pressure: LV 120/15  Ao: 120/65  Oxygen saturation: Pulmonary artery 69%; central aorta 95%  Cardiac output: Fick method 6.1; thermodilution method 3.7 l/min  Cardiac index: 2.7 1.6 l/min/m2  ANGIOGRAPHY:  1. Left main: Angiographically normal vessel  2. LAD: Minimal proximal luminal irregularity but otherwise angiographically normal vessel which gave rise to 2 major diagonal vessels and septal perforating artery.  3. Left circumflex: Angiographically normal vessel  4. Right coronary artery:  Angiographically normal dominant right coronary artery  Left ventriculography revealed global ejection fraction of 50-55%. There was normal to minimal aortic root dilatation. There were no focal segmental wall motion abnormalities.  IMPRESSION:  No significant coronary obstructive disease with essentially normal coronary arteries only minimal luminal irregularity of the proximal LAD.  Normal to low normal global LV function without focal segmental wall motion abnormalities with an ejection fraction of approximately 50-55%.  No hemodynamic evidence for constrictive pericarditis with low RVEDP pressure, no evidence for early diastolic rapid Y descent, and following fluid challenge, no evidence for equalization of LVEDP and RVEDP.  No hemodynamic evidence for cardiac tamponade.  [name] Sine, MD, [facility]  03/17/2013  4:31 PM    Impression:   He has a moderate pericardial effusion with shortness of breath, orthopnea and lower extremity edema. He has had a small effusion documented since 2007 and it was still present in 2012 and clearly a little larger now. There is no evidence of hemodynamic compromise by echo or vital signs and no signs of constrictive pericarditis. I think it would be best to drain this effusion by subxyphoid pericardial window to see if that resolves his symptoms. This would also allow diagnostic testing of the fluid and the pericardium although I suspect this will  be idiopathic. I discussed the procedure with him including alternatives, benefits and risks including but not limited to bleeding, infection, recurrence of the effusion and the possibility that it may not resolve his symptoms. He understands and would like to proceed with surgical drainage. 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 He is symptomatic but stable.   Plan:   We will plan subxyphoid pericardial window on Tuesday 04/25/2013.

## 2013-04-25 NOTE — Progress Notes (Signed)
Patient ID: HENRIK ORIHUELA, male   DOB: Jul 16, 1945, 68 y.o.   MRN: [id number] EVENING ROUNDS NOTE :     [address]       [place] [address]             [phone]                 Day of Surgery Procedure(s) (LRB): SUBXYPHOID PERICARDIAL WINDOW (N/A)  Total Length of Stay:  LOS: 0 days  BP 120/80  Pulse 97  Temp(Src) 98 F (36.7 C) (Oral)  Resp 14  Ht 6\' 2"  (1.88 m)  Wt 220 lb (99.791 kg)  BMI 28.23 kg/m2  SpO2 98%  .Intake/Output     01/19 0701 - 01/20 0700 01/20 0701 - 01/21 0700   I.V. (mL/kg)  1600 (16)   Total Intake(mL/kg)  1600 (16)   Urine (mL/kg/hr)  410   Chest Tube  90   Total Output   500   Net   +1100          . dextrose 5 % and 0.45 % NaCl with KCl 20 mEq/L 50 mL/hr (04/25/13 1438)     Lab Results  Component Value Date   WBC 7.0 04/24/2013   HGB 12.7* 04/24/2013   HCT 37.5* 04/24/2013   PLT 206 04/24/2013   GLUCOSE 112* 04/24/2013   CHOL  Value: 188        ATP III CLASSIFICATION:  <200     mg/dL   Desirable  200-239  mg/dL   Borderline High  >=240    mg/dL   High 08/23/2007   TRIG 63 08/23/2007   HDL 47 08/23/2007   LDLCALC  Value: 128        Total Cholesterol/HDL:CHD Risk Coronary Heart Disease Risk Table                     Men   Women  1/2 Average Risk   3.4   3.3* 08/23/2007   ALT 30 04/24/2013   AST 28 04/24/2013   NA 139 04/24/2013   K 4.2 04/24/2013   CL 100 04/24/2013   CREATININE 1.05 04/24/2013   BUN 27* 04/24/2013   CO2 22 04/24/2013   TSH 0.812 08/22/2010   INR 0.90 04/24/2013   Extubated,  Minimal drainage fron mt   Grace Isaac MD  Beeper [phone] Office [phone] 04/25/2013 5:34 PM

## 2013-04-25 NOTE — Preoperative (Signed)
Beta Blockers   Reason not to administer Beta Blockers:Not Applicable, last dose 7/34/28 at 23:00

## 2013-04-25 NOTE — Brief Op Note (Signed)
04/25/2013  1:40 PM  PATIENT:  [name]  68 y.o. male  PRE-OPERATIVE DIAGNOSIS:  Pericardial Effusion  POST-OPERATIVE DIAGNOSIS:  Pericardial Effusion  PROCEDURE:  Procedure(s): SUBXYPHOID PERICARDIAL WINDOW (N/A)  SURGEON:  Surgeon(s) and Role:    * Gaye Pollack, MD - Primary  PHYSICIAN ASSISTANT: Lars Pinks, PA-C  ASSISTANTS: same  ANESTHESIA:   general  EBL:     BLOOD ADMINISTERED:none  DRAINS: (32 F) Blake drain(s) in the pericardium   LOCAL MEDICATIONS USED:  NONE  SPECIMEN:  Source of Specimen:  Pericardial fluid for cytology and culture, pericardial biopsy  DISPOSITION OF SPECIMEN:  Pathology and micro  COUNTS:  YES   DICTATION: .Note written in EPIC  PLAN OF CARE: Admit to inpatient   PATIENT DISPOSITION:  PACU - hemodynamically stable.   Delay start of Pharmacological VTE agent (>24hrs) due to surgical blood loss or risk of bleeding: yes

## 2013-04-26 ENCOUNTER — Encounter (HOSPITAL_COMMUNITY): Payer: Self-pay | Admitting: Cardiology

## 2013-04-26 DIAGNOSIS — Z7901 Long term (current) use of anticoagulants: Secondary | ICD-10-CM

## 2013-04-26 DIAGNOSIS — I4891 Unspecified atrial fibrillation: Secondary | ICD-10-CM

## 2013-04-26 DIAGNOSIS — I319 Disease of pericardium, unspecified: Principal | ICD-10-CM

## 2013-04-26 NOTE — Progress Notes (Signed)
UR Completed.  [name] [phone] 04/26/2013  

## 2013-04-26 NOTE — Progress Notes (Addendum)
TCTS DAILY ICU PROGRESS NOTE                   [address]            [place] [address]          [phone]   1 Day Post-Op Procedure(s) (LRB): SUBXYPHOID PERICARDIAL WINDOW (N/A)  Total Length of Stay:  LOS: 1 day   Subjective: Patient states he was able to lie flat last night for the first time in a long time. He also states his breathing is better.  Objective: Vital signs in last 24 hours: Temp:  [97.4 F (36.3 C)-98.5 F (36.9 C)] 97.9 F (36.6 C) (01/21 0400) Pulse Rate:  [72-104] 76 (01/21 0700) Cardiac Rhythm:  [-] Normal sinus rhythm (01/21 0600) Resp:  [12-33] 17 (01/21 0700) BP: (100-155)/(66-96) 110/66 mmHg (01/21 0700) SpO2:  [90 %-100 %] 98 % (01/21 0700) Arterial Line BP: (106-197)/(54-90) 109/58 mmHg (01/21 0700) Weight:  [99.791 kg (220 lb)] 99.791 kg (220 lb) (01/20 1605)  Filed Weights   04/25/13 1605  Weight: 99.791 kg (220 lb)      Intake/Output from previous day: 01/20 0701 - 01/21 0700 In: 3210 [P.O.:960; I.V.:2250] Out: 1980 [Urine:1380; Chest Tube:600]     Current Meds: Scheduled Meds: . atorvastatin  40 mg Oral q1800  . bisacodyl  10 mg Oral Daily  . cholecalciferol  2,000 Units Oral Daily  . flecainide  100 mg Oral BID  . LORazepam  0.5 mg Oral QHS  . metoprolol succinate  100 mg Oral QHS  . pantoprazole  40 mg Oral Daily   Continuous Infusions: . dextrose 5 % and 0.45 % NaCl with KCl 20 mEq/L 50 mL/hr at 04/26/13 0600   PRN Meds:.morphine injection, ondansetron (ZOFRAN) IV, oxyCODONE, oxyCODONE-acetaminophen, potassium chloride, senna-docusate, traMADol  General appearance: alert, cooperative and no distress Heart: RRR, rub with chest tube in place Lungs: clear to auscultation bilaterally Abdomen: soft, non-tender; bowel sounds normal; no masses,  no organomegaly Extremities: SCDs in place Wound: Dressing with small sero sanguinouse ooze  Lab Results: CBC: Recent Labs  04/24/13 1409  WBC 7.0  HGB  12.7*  HCT 37.5*  PLT 206   BMET:  Recent Labs  04/24/13 1409  NA 139  K 4.2  CL 100  CO2 22  GLUCOSE 112*  BUN 27*  CREATININE 1.05  CALCIUM 9.4    PT/INR:  Recent Labs  04/24/13 1409  LABPROT 12.0  INR 0.90   Radiology: Dg Chest 2 View  04/24/2013   CLINICAL DATA:  Preop radiograph.  Pericardial effusion.  EXAM: CHEST  2 VIEW  COMPARISON:  None.  FINDINGS: Moderate enlargement of the cardiac silhouette is identified. Both lungs are clear. The visualized skeletal structures are unremarkable.  IMPRESSION: Moderate enlargement of the cardiac silhouette.   Electronically Signed   By: Kerby Moors M.D.   On: 04/24/2013 15:49   Dg Chest Portable 1 View  04/25/2013   CLINICAL DATA:  Subxiphoid pericardial window, postoperative.  EXAM: PORTABLE CHEST - 1 VIEW  COMPARISON:  DG CHEST 2 VIEW dated 04/24/2013; CT ANGIO CHEST W/CM &/OR WO/CM dated 03/10/2013  FINDINGS: Stably enlarged cardiopericardial silhouette. Subsegmental atelectasis in the lingula and right middle lobe. Mildly tortuous thoracic aorta.  Right IJ line tip: SVC. No appreciable pneumothorax or pneumopericardium.  IMPRESSION: 1. There is subsegmental atelectasis in the lingula and right middle lobe. Stably enlarged cardiopericardial silhouette without pneumothorax or pneumopericardium. 2. Right IJ line tip:  SVC.   Electronically Signed   By: Sherryl Barters M.D.   On: 04/25/2013 14:40     Assessment/Plan: S/P Procedure(s) (LRB): SUBXYPHOID PERICARDIAL WINDOW (N/A)  1.CV-SR in the 70's. On Toprol XL 100 at hs and Felcainaide 100 bid. Pradaxa not started yet. 2.Pulmonary-Pericardial drain with 600 cc since surgery. Leave tube for now.Pericardial fluid gram stain showed no organisms and culture no growth yet. 3.Remove a line 4.Advance diet as tolerates and heplock IVF when tolerates diet 5.Likely transfer to [facility] PA-C 04/26/2013 7:50 AM   Chart reviewed, patient examined, agree with  above. Continue chest tube to suction today. He was on Pradaxa for PAF but is in sinus now. He has infrequent episodes. Will hold off on this for now.

## 2013-04-26 NOTE — Progress Notes (Signed)
Subjective: Feeling good no complaints  Objective: Vital signs in last 24 hours: Temp:  [97.4 F (36.3 C)-98.7 F (37.1 C)] 98.5 F (36.9 C) (01/21 1211) Pulse Rate:  [72-101] 84 (01/21 1211) Resp:  [12-37] 20 (01/21 1211) BP: (100-155)/(57-96) 106/57 mmHg (01/21 1211) SpO2:  [90 %-100 %] 99 % (01/21 1211) Arterial Line BP: (106-197)/(54-90) 108/60 mmHg (01/21 0900) Weight:  [220 lb (99.791 kg)] 220 lb (99.791 kg) (01/20 1605) Weight change:    Intake/Output from previous day: +1230  01/20 0701 - 01/21 0700 In: 3210 [P.O.:960; I.V.:2250] Out: 1980 [Urine:1380; Chest Tube:600] Intake/Output this shift: Total I/O In: 650 [P.O.:500; I.V.:150] Out: 435 [Urine:275; Chest Tube:160]  PE: General:Pleasant affect, NAD Skin:Warm and dry, brisk capillary refill HEENT:normocephalic, sclera clear, mucus membranes moist Heart:S1S2 RRR without murmur, gallup, rub or click Lungs:clear without rales, rhonchi, or wheezes WJX:BJYN, non tender, + BS, do not palpate liver spleen or masses Ext:no lower ext edema, 2+ pedal pulses, 2+ radial pulses Neuro:alert and oriented, MAE, follows commands, + facial symmetry   Lab Results:  Recent Labs  04/24/13 1409  WBC 7.0  HGB 12.7*  HCT 37.5*  PLT 206   BMET  Recent Labs  04/24/13 1409  NA 139  K 4.2  CL 100  CO2 22  GLUCOSE 112*  BUN 27*  CREATININE 1.05  CALCIUM 9.4   No results found for this basename: TROPONINI, CK, MB,  in the last 72 hours  Lab Results  Component Value Date   CHOL  Value: 188        ATP III CLASSIFICATION:  <200     mg/dL   Desirable  200-239  mg/dL   Borderline High  >=240    mg/dL   High 08/23/2007   HDL 47 08/23/2007   LDLCALC  Value: 128        Total Cholesterol/HDL:CHD Risk Coronary Heart Disease Risk Table                     Men   Women  1/2 Average Risk   3.4   3.3* 08/23/2007   TRIG 63 08/23/2007   CHOLHDL 4.0 08/23/2007   No results found for this basename: HGBA1C     Lab Results    Component Value Date   TSH 0.812 08/22/2010    Hepatic Function Panel  Recent Labs  04/24/13 1409  PROT 7.0  ALBUMIN 4.1  AST 28  ALT 30  ALKPHOS 76  BILITOT 0.4   No results found for this basename: CHOL,  in the last 72 hours No results found for this basename: PROTIME,  in the last 72 hours     Studies/Results: Dg Chest 2 View  04/24/2013   CLINICAL DATA:  Preop radiograph.  Pericardial effusion.  EXAM: CHEST  2 VIEW  COMPARISON:  None.  FINDINGS: Moderate enlargement of the cardiac silhouette is identified. Both lungs are clear. The visualized skeletal structures are unremarkable.  IMPRESSION: Moderate enlargement of the cardiac silhouette.   Electronically Signed   By: Kerby Moors M.D.   On: 04/24/2013 15:49   Dg Chest Portable 1 View  04/25/2013   CLINICAL DATA:  Subxiphoid pericardial window, postoperative.  EXAM: PORTABLE CHEST - 1 VIEW  COMPARISON:  DG CHEST 2 VIEW dated 04/24/2013; CT ANGIO CHEST W/CM &/OR WO/CM dated 03/10/2013  FINDINGS: Stably enlarged cardiopericardial silhouette. Subsegmental atelectasis in the lingula and right middle lobe. Mildly tortuous thoracic aorta.  Right  IJ line tip: SVC. No appreciable pneumothorax or pneumopericardium.  IMPRESSION: 1. There is subsegmental atelectasis in the lingula and right middle lobe. Stably enlarged cardiopericardial silhouette without pneumothorax or pneumopericardium. 2. Right IJ line tip: SVC.   Electronically Signed   By: Sherryl Barters M.D.   On: 04/25/2013 14:40    Medications: I have reviewed the patient's current medications. Scheduled Meds: . atorvastatin  40 mg Oral q1800  . bisacodyl  10 mg Oral Daily  . cholecalciferol  2,000 Units Oral Daily  . flecainide  100 mg Oral BID  . LORazepam  0.5 mg Oral QHS  . metoprolol succinate  100 mg Oral QHS  . pantoprazole  40 mg Oral Daily   Continuous Infusions:  PRN Meds:.morphine injection, ondansetron (ZOFRAN) IV, oxyCODONE, oxyCODONE-acetaminophen,  potassium chloride, senna-docusate, traMADol  Assessment/Plan: Principal Problem:   Pericardial effusion (noninflammatory) Active Problems:   S/P pericardial operation, 04/25/12, window   PAF (paroxysmal atrial fibrillation), maintaining SR, last DCCV 02/08/14   Sleep apnea   PLAN: anticoagulation with Pradaxa, holding for now. Maintaining SR.    LOS: 1 day   Time spent with pt. :15 minutes. [facility] R  Nurse Practitioner Certified Pager [phone] or after 5pm and on weekends call [phone] 04/26/2013, 12:20 PM  Patient seen and examined and history reviewed. Agree with above findings and plan. Doing well post pericardial window. Notes an immediate improvement in breathing. Maintaining NSR for now.  Collier Salina [facility] 04/26/2013 1:39 PM

## 2013-04-27 ENCOUNTER — Other Ambulatory Visit: Payer: Self-pay | Admitting: *Deleted

## 2013-04-27 ENCOUNTER — Encounter (HOSPITAL_COMMUNITY): Payer: Self-pay | Admitting: Surgery

## 2013-04-27 ENCOUNTER — Inpatient Hospital Stay (HOSPITAL_COMMUNITY): Payer: Medicare Other

## 2013-04-27 MED ORDER — POTASSIUM CHLORIDE CRYS ER 20 MEQ PO TBCR: 30.0000 meq | EXTENDED_RELEASE_TABLET | Freq: Every day | ORAL | Status: DC

## 2013-04-27 MED ORDER — PANTOPRAZOLE SODIUM 40 MG PO TBEC: DELAYED_RELEASE_TABLET | ORAL | Status: DC

## 2013-04-27 NOTE — Progress Notes (Signed)
Patient Name: [name] [name] Date of Encounter: 04/27/2013  Principal Problem:   Pericardial effusion (noninflammatory) Active Problems:   PAF (paroxysmal atrial fibrillation), maintaining SR, last DCCV 02/08/14   Sleep apnea   S/P pericardial operation, 04/25/12, window    SUBJECTIVE: Breathing better than he has in years.  OBJECTIVE Filed Vitals:   04/26/13 1211 04/26/13 1508 04/26/13 2119 04/27/13 0636  BP: 106/57 105/67 117/70 113/70  Pulse: 84 84 88 70  Temp: 98.5 F (36.9 C) 98.6 F (37 C) 99 F (37.2 C) 98.3 F (36.8 C)  TempSrc: Oral Oral Oral Oral  Resp: 20 20 19 18   Height:      Weight:      SpO2: 99% 100% 96% 96%    Intake/Output Summary (Last 24 hours) at 04/27/13 0949 Last data filed at 04/27/13 0901  Gross per 24 hour  Intake    980 ml  Output   1625 ml  Net   -645 ml   Filed Weights   04/25/13 1605  Weight: 220 lb (99.791 kg)   PHYSICAL EXAM General: Well developed, well nourished, male in no acute distress. Head: Normocephalic, atraumatic.  Neck: Supple without bruits, JVD not elevated on left, right bandaged and dressed. Lungs:  Resp regular and unlabored, CTA. Heart: RRR, S1, S2, no S3, S4, or murmur; no rub. Abdomen: Soft, non-tender, non-distended, BS + x 4.  Extremities: No clubbing, cyanosis, no edema.  Neuro: Alert and oriented X 3. Moves all extremities spontaneously. Psych: Normal affect.  LABS: CBC: Recent Labs  04/24/13 1409  WBC 7.0  HGB 12.7*  HCT 37.5*  MCV 87.8  PLT 206   INR: Recent Labs  04/24/13 1409  INR 8.54   Basic Metabolic Panel: Recent Labs  04/24/13 1409  NA 139  K 4.2  CL 100  CO2 22  GLUCOSE 112*  BUN 27*  CREATININE 1.05  CALCIUM 9.4   Liver Function Tests: Recent Labs  04/24/13 1409  AST 28  ALT 30  ALKPHOS 76  BILITOT 0.4  PROT 7.0  ALBUMIN 4.1   BNP: Pro B Natriuretic peptide (BNP)  Date/Time Value Range Status  03/14/2013  4:15 PM 58.97  <126 pg/mL Final  02/06/2013   1:53 PM 400.90* <126 pg/mL Final   TELE:    Radiology/Studies: Dg Chest Port 1 View 04/27/2013   CLINICAL DATA:  Subxiphoid pericardial window  EXAM: PORTABLE CHEST - 1 VIEW  COMPARISON:  04/25/2013  FINDINGS: Increased interstitial markings, favored to reflect mild interstitial edema. Patchy bibasilar opacities, likely atelectasis. Possible small left pleural effusion. No pneumothorax.  Cardiomegaly.  Stable right IJ venous catheter terminating in the lower SVC.  IMPRESSION: Cardiomegaly with suspected mild interstitial edema and possible small left pleural effusion.  Patchy bibasilar opacities, likely atelectasis.   Electronically Signed   By: Julian Hy M.D.   On: 04/27/2013 08:06   Dg Chest Portable 1 View 04/25/2013   CLINICAL DATA:  Subxiphoid pericardial window, postoperative.  EXAM: PORTABLE CHEST - 1 VIEW  COMPARISON:  DG CHEST 2 VIEW dated 04/24/2013; CT ANGIO CHEST W/CM &/OR WO/CM dated 03/10/2013  FINDINGS: Stably enlarged cardiopericardial silhouette. Subsegmental atelectasis in the lingula and right middle lobe. Mildly tortuous thoracic aorta.  Right IJ line tip: SVC. No appreciable pneumothorax or pneumopericardium.  IMPRESSION: 1. There is subsegmental atelectasis in the lingula and right middle lobe. Stably enlarged cardiopericardial silhouette without pneumothorax or pneumopericardium. 2. Right IJ line tip: SVC.   Electronically Signed  By: Sherryl Barters M.D.   On: 04/25/2013 14:40     Current Medications:  . atorvastatin  40 mg Oral q1800  . bisacodyl  10 mg Oral Daily  . cholecalciferol  2,000 Units Oral Daily  . flecainide  100 mg Oral BID  . LORazepam  0.5 mg Oral QHS  . metoprolol succinate  100 mg Oral QHS  . pantoprazole  40 mg Oral Daily      ASSESSMENT AND PLAN: Principal Problem:   Pericardial effusion (noninflammatory) - s/p window 04/25/12, still draining, possible removal 01/23, per TCTS  Active Problems:   PAF (paroxysmal atrial fibrillation),  maintaining SR, last DCCV 02/08/14 - still in SR    Sleep apnea - on CPAP    S/P pericardial operation, 04/25/12, window     Chronic anticoagulation - w/ Pradaxa, on hold, restart when OK with TCTS  Generally doing well, d/c when medically stable per TCTS, f/u appt with TK made.  SignedRosaria Ferries , PA-C 9:49 AM 04/27/2013 Patient seen and examined and history reviewed. Agree with above findings and plan. Doing very well. Breathing is much better. Ambulating. Maintaining NSR.   Collier Salina [facility] 04/27/2013 12:01 PM

## 2013-04-27 NOTE — Progress Notes (Signed)
Pt ambulated 1240 feet pushing wheelchair; steady gait noted; pt reports how much better his breathing is  Post op; pt impressed at how well he can ambulate; pt back to room after walk; call bell w/i reach; will cont. To monitor.

## 2013-04-27 NOTE — Progress Notes (Signed)
Pt ambulated approximately 1250 feet pushing wheelchair with RN; steady gait noted; no SOB; pt back to room to sit on side of bed; pain meds given; will cont. To monitor.

## 2013-04-27 NOTE — Telephone Encounter (Signed)
Patient has called---he had is pericardial "window" procedure done today and everythins went well.  He would like for Dr. Claiborne Billings yo approve him for his carpel tunnel surgery ASAP!

## 2013-04-27 NOTE — Progress Notes (Addendum)
[address]       [place] [address]             [phone]      2 Days Post-Op  Procedure(s) (LRB): SUBXYPHOID PERICARDIAL WINDOW (N/A) Subjective: Feels well  Objective  Telemetry sinus rhythm  Temp:  [98.3 F (36.8 C)-99 F (37.2 C)] 98.3 F (36.8 C) (01/22 0636) Pulse Rate:  [70-88] 70 (01/22 0636) Resp:  [18-37] 18 (01/22 0636) BP: (101-117)/(57-76) 113/70 mmHg (01/22 0636) SpO2:  [96 %-100 %] 96 % (01/22 0636) Arterial Line BP: (108-129)/(60-70) 108/60 mmHg (01/21 0900)   Intake/Output Summary (Last 24 hours) at 04/27/13 0732 Last data filed at 04/27/13 0636  Gross per 24 hour  Intake   1150 ml  Output   1350 ml  Net   -200 ml       General appearance: alert, cooperative and no distress Heart: regular rate and rhythm Lungs: clear to auscultation bilaterally Abdomen: benign Extremities: mild edema Wound: dressing with minimal drainage  Lab Results:  Recent Labs  04/24/13 1409  NA 139  K 4.2  CL 100  CO2 22  GLUCOSE 112*  BUN 27*  CREATININE 1.05  CALCIUM 9.4    Recent Labs  04/24/13 1409  AST 28  ALT 30  ALKPHOS 76  BILITOT 0.4  PROT 7.0  ALBUMIN 4.1   No results found for this basename: LIPASE, AMYLASE,  in the last 72 hours  Recent Labs  04/24/13 1409  WBC 7.0  HGB 12.7*  HCT 37.5*  MCV 87.8  PLT 206   No results found for this basename: CKTOTAL, CKMB, TROPONINI,  in the last 72 hours No components found with this basename: POCBNP,  No results found for this basename: DDIMER,  in the last 72 hours No results found for this basename: HGBA1C,  in the last 72 hours No results found for this basename: CHOL, HDL, LDLCALC, TRIG, CHOLHDL,  in the last 72 hours No results found for this basename: TSH, T4TOTAL, FREET3, T3FREE, THYROIDAB,  in the last 72 hours No results found for this basename: VITAMINB12, FOLATE, FERRITIN, TIBC, IRON, RETICCTPCT,  in the last 72 hours  Medications: Scheduled .  atorvastatin  40 mg Oral q1800  . bisacodyl  10 mg Oral Daily  . cholecalciferol  2,000 Units Oral Daily  . flecainide  100 mg Oral BID  . LORazepam  0.5 mg Oral QHS  . metoprolol succinate  100 mg Oral QHS  . pantoprazole  40 mg Oral Daily     Radiology/Studies:  Dg Chest Portable 1 View  04/25/2013   CLINICAL DATA:  Subxiphoid pericardial window, postoperative.  EXAM: PORTABLE CHEST - 1 VIEW  COMPARISON:  DG CHEST 2 VIEW dated 04/24/2013; CT ANGIO CHEST W/CM &/OR WO/CM dated 03/10/2013  FINDINGS: Stably enlarged cardiopericardial silhouette. Subsegmental atelectasis in the lingula and right middle lobe. Mildly tortuous thoracic aorta.  Right IJ line tip: SVC. No appreciable pneumothorax or pneumopericardium.  IMPRESSION: 1. There is subsegmental atelectasis in the lingula and right middle lobe. Stably enlarged cardiopericardial silhouette without pneumothorax or pneumopericardium. 2. Right IJ line tip: SVC.   Electronically Signed   By: Sherryl Barters M.D.   On: 04/25/2013 14:40   CXR stable Chest tube - No air leak, 350 cc out put  INR: Will add last result for INR, ABG once components are confirmed Will add last 4 CBG results once components are confirmed  Assessment/Plan: S/P Procedure(s) (LRB): SUBXYPHOID  PERICARDIAL WINDOW (N/A)  1 doing quite well clinically, still with moderate drainage so will leave tube in place 2  d/c central line 3 routine rehab/ pulm toilett     LOS: 2 days    GOLD,WAYNE E 1/22/20157:32 AM   Chart reviewed, patient examined, agree with above. Will put pericardial tube to water seal Pathology negative. Cytology negative Culture negative. Suspect this is a due to a viral illness.

## 2013-04-27 NOTE — Progress Notes (Signed)
CARDIAC REHAB PHASE I   PRE:  Rate/Rhythm: 81SR  BP:  Supine:   Sitting: 126/83  Standing:    SaO2: 95%RA  MODE:  Ambulation: 900 ft   POST:  Rate/Rhythm: 88SR  BP:  Supine:   Sitting: 115/77  Standing:    SaO2: 96%RA 1400-1450 Pt walked 900 ft on RA independently pushing wheelchair. Tolerated well. Feels better than he has in a long time. 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   Graylon Good, RN BSN  04/27/2013 2:50 PM

## 2013-04-28 LAB — BODY FLUID CULTURE: Culture: NO GROWTH

## 2013-04-28 MED ORDER — FUROSEMIDE 20 MG PO TABS
20.0000 mg | ORAL_TABLET | Freq: Every day | ORAL | Status: AC
  Administered 2013-04-28: 20 mg via ORAL
  Filled 2013-04-28: qty 1

## 2013-04-28 NOTE — Discharge Instructions (Signed)
Activity: 1.May walk up steps                2.No lifting more than ten pounds for four weeks.                 3.No driving for two weeks.                4.Stop any activity that causes chest pain, shortness of breath, dizziness,                                           sweating or excessive weakness.                5.Avoid straining.                6.Continue with your breathing exercises daily.  Diet: Low fat, Low salt diet  Wound Care: May shower.  Clean wounds with mild soap and water daily. Contact the office at [phone] if any problems arise.

## 2013-04-28 NOTE — Progress Notes (Signed)
Patient Name: [name] [name] [name] Date of Encounter: 04/28/2013  Principal Problem:   Pericardial effusion (noninflammatory) Active Problems:   PAF (paroxysmal atrial fibrillation), maintaining SR, last DCCV 02/08/14   Sleep apnea   S/P pericardial operation, 04/25/12, window    SUBJECTIVE: Breathing better than he has in years.  OBJECTIVE Filed Vitals:   04/27/13 1400 04/27/13 1951 04/27/13 2229 04/28/13 0609  BP: 117/78 120/75 132/81 110/68  Pulse: 78 79 83 68  Temp: 98.8 F (37.1 C) 98.7 F (37.1 C) 98.9 F (37.2 C) 98.8 F (37.1 C)  TempSrc: Oral Oral    Resp: 18 18 18 16   Height:      Weight:      SpO2: 93% 96% 94% 98%    Intake/Output Summary (Last 24 hours) at 04/28/13 5643 Last data filed at 04/28/13 0600  Gross per 24 hour  Intake   1200 ml  Output   2970 ml  Net  -1770 ml   Filed Weights   04/25/13 1605  Weight: 220 lb (99.791 kg)   PHYSICAL EXAM General: Well developed, well nourished, male in no acute distress. Head: Normocephalic, atraumatic.  Neck: Supple without bruits, JVD not elevated on left, right bandaged and dressed. Lungs:  Resp regular and unlabored, CTA. Heart: RRR, S1, S2, no S3, S4, or murmur; mild rub. Abdomen: Soft, non-tender, non-distended, BS + x 4.  Extremities: No clubbing, cyanosis, no edema.  Neuro: Alert and oriented X 3. Moves all extremities spontaneously. Psych: Normal affect.  LABS: CBC:No results found for this basename: WBC, NEUTROABS, HGB, HCT, MCV, PLT,  in the last 72 hours INR:No results found for this basename: INR,  in the last 72 hours Basic Metabolic Panel:No results found for this basename: NA, K, CL, CO2, GLUCOSE, BUN, CREATININE, CALCIUM, MG, PHOS,  in the last 72 hours Liver Function Tests:No results found for this basename: AST, ALT, ALKPHOS, BILITOT, PROT, ALBUMIN,  in the last 72 hours BNP: Pro B Natriuretic peptide (BNP)  Date/Time Value Range Status  03/14/2013  4:15 PM 58.97  <126 pg/mL Final    02/06/2013  1:53 PM 400.90* <126 pg/mL Final   TELE:  NSR, no Afib  Radiology/Studies: Dg Chest Port 1 View 04/27/2013   CLINICAL DATA:  Subxiphoid pericardial window  EXAM: PORTABLE CHEST - 1 VIEW  COMPARISON:  04/25/2013  FINDINGS: Increased interstitial markings, favored to reflect mild interstitial edema. Patchy bibasilar opacities, likely atelectasis. Possible small left pleural effusion. No pneumothorax.  Cardiomegaly.  Stable right IJ venous catheter terminating in the lower SVC.  IMPRESSION: Cardiomegaly with suspected mild interstitial edema and possible small left pleural effusion.  Patchy bibasilar opacities, likely atelectasis.   Electronically Signed   By: Julian Hy M.D.   On: 04/27/2013 08:06   Dg Chest Portable 1 View 04/25/2013   CLINICAL DATA:  Subxiphoid pericardial window, postoperative.  EXAM: PORTABLE CHEST - 1 VIEW  COMPARISON:  DG CHEST 2 VIEW dated 04/24/2013; CT ANGIO CHEST W/CM &/OR WO/CM dated 03/10/2013  FINDINGS: Stably enlarged cardiopericardial silhouette. Subsegmental atelectasis in the lingula and right middle lobe. Mildly tortuous thoracic aorta.  Right IJ line tip: SVC. No appreciable pneumothorax or pneumopericardium.  IMPRESSION: 1. There is subsegmental atelectasis in the lingula and right middle lobe. Stably enlarged cardiopericardial silhouette without pneumothorax or pneumopericardium. 2. Right IJ line tip: SVC.   Electronically Signed   By: Sherryl Barters M.D.   On: 04/25/2013 14:40     Current Medications:  . atorvastatin  40 mg Oral q1800  . bisacodyl  10 mg Oral Daily  . cholecalciferol  2,000 Units Oral Daily  . flecainide  100 mg Oral BID  . furosemide  20 mg Oral Daily  . LORazepam  0.5 mg Oral QHS  . metoprolol succinate  100 mg Oral QHS  . pantoprazole  40 mg Oral Daily      ASSESSMENT AND PLAN: Principal Problem:   Pericardial effusion (noninflammatory) - s/p window 04/25/12, still draining, possible removal 01/24, per  TCTS  Active Problems:   PAF (paroxysmal atrial fibrillation), maintaining SR, last DCCV 02/08/14 - still in SR, on flecainide    Sleep apnea - on CPAP    S/P pericardial operation, 04/25/12, window     Chronic anticoagulation - w/ Pradaxa, on hold, restart when OK with TCTS  Generally doing well, d/c when medically stable per TCTS, f/u appt with TK made in 2 weeks. Patient very anxious to have carpel tunnel surgery. Will need to discuss with Dr. Claiborne Billings.  Arie Sabina [facility] 04/28/2013 9:28 AM

## 2013-04-28 NOTE — Discharge Summary (Signed)
Physician Discharge Summary       [address]       [place] [address]             [phone]    Patient ID: [name] [name] MRN: [id number] DOB/AGE: [birth year] 67 y.o.  Admit date: 04/25/2013 Discharge date: 04/29/2013  Admission Diagnoses: 1. Pericardial effusion 2. History of cardiomyopathy 3. History of PAF 4. History of hypertension 5. History of hyperlipidemia 6. History of OSA 7. History of thoracic aortic aneurysm 8. History of tobacco abuse 9. History of depression  Discharge Diagnoses:  1. Pericardial effusion 2. History of cardiomyopathy 3. History of PAF 4. History of hypertension 5. History of hyperlipidemia 6. History of OSA 7. History of thoracic aortic aneurysm 8. History of tobacco abuse 9. History of depression  Procedure (s):  Sub-xyphoid pericardial window by Dr. Cyndia Bent on 04/25/2013.  Pathology:Pericardium, biopsy - BENIGN MESOTHELIAL LINED FIBROADIPOSE TISSUE. - THERE IS NO EVIDENCE OF MALIGNANCY.  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 He also has a history of PAF and underwent 2 ablation procedures in 2002 and 2009. He has been known to have a pericardial effusion since 2007. An echo in 2012 showed a small effusion. He had been maintaining sinus rhythm on Flecainide and anticoagulated with Pradaxa until he returned from [place] after spending last summer there producing a play. He developed recurrent A-fib and was cardioverted on 02/08/2013. He now reports progressive shortness of breath and lower extremity edema. An echo on 03/06/2013 showed a moderate pericardial effusion that had increased from his prior echo. There was no sign of tamponade. His diuretic was increased. A CT scan of the chest ruled out PE. There was borderline enlargement of the ascending aorta at 4.0  cm. It showed a moderate concentric pericardial effusion. There were some small cysts in the liver and a 5 mm rounded hyperenhancing mass in the left lobe of the liver of unclear significance. He subsequently underwent R and L heart cath on 03/17/2013 which showed no significant coronary disease. LV function was preserved at 50-55%. There was no hemodynamic evidence of constrictive pericarditis and his pericardium did not look thickened on CT. Dr. Cyndia Bent discussed the need for subxiphoid pericardial window. Potential risks, benefits, and complications were discussed with the patient and he agreed to proceed. He underwent the aforementioned procedure on 04/25/2013.  Brief Hospital Course:  He has remained afebrile and hemodynamically stable. A line and foley were removed early in his post operative course. He was surgically stable for transfer from ICU to 2000 on 1/21. He was restarted on Flecainide and Toprol XL as taken pre op. Pradaxa has not yet been restarted.Pericardial chest tube drainage gradually decreased. Chest x rays remained stable. Pericardial tube was removed on 1/24. Pathology, cytology, and culture all remained negative. He is tolerating a diet and ambulating on room air. He was seen and evaluated by Jadene Pierini PA-C on 1/24 and was felt surgically stable for discharge.  Latest Vital Signs: Blood pressure 139/88, pulse 74, temperature 98 F (36.7 C), temperature source Oral, resp. rate 20, height 6\' 2"  (1.88 m), weight 99.791 kg (220 lb), SpO2 97.00%.  Physical Exam: General appearance: alert, cooperative and no distress  Heart: regular rate and rhythm  Lungs: clear to auscultation bilaterally  Abdomen: benign  Extremities: min  edema  Wound: incis healing well   Discharge Condition:Stable  Recent laboratory studies:  Lab Results  Component Value Date   WBC 7.0 04/24/2013   HGB 12.7* 04/24/2013   HCT 37.5* 04/24/2013   MCV 87.8 04/24/2013   PLT 206 04/24/2013   Lab Results    Component Value Date   NA 139 04/24/2013   K 4.2 04/24/2013   CL 100 04/24/2013   CO2 22 04/24/2013   CREATININE 1.05 04/24/2013   GLUCOSE 112* 04/24/2013      Diagnostic Studies: Dg Chest Port 1 View  04/27/2013   CLINICAL DATA:  Subxiphoid pericardial window  EXAM: PORTABLE CHEST - 1 VIEW  COMPARISON:  04/25/2013  FINDINGS: Increased interstitial markings, favored to reflect mild interstitial edema. Patchy bibasilar opacities, likely atelectasis. Possible small left pleural effusion. No pneumothorax.  Cardiomegaly.  Stable right IJ venous catheter terminating in the lower SVC.  IMPRESSION: Cardiomegaly with suspected mild interstitial edema and possible small left pleural effusion.  Patchy bibasilar opacities, likely atelectasis.   Electronically Signed   By: Julian Hy M.D.   On: 04/27/2013 08:06     Future Appointments Provider Department Dept Phone   05/10/2013 2:30 PM Gaye Pollack, MD [facility] [phone]   05/16/2013 8:15 AM Troy Sine, MD [facility] [phone]      Discharge Medications:   Medication List    STOP taking these medications       PRADAXA 150 MG Caps capsule  Generic drug:  dabigatran      TAKE these medications       atorvastatin 40 MG tablet  Commonly known as:  LIPITOR  Take 40 mg by mouth daily.     finasteride 1 MG tablet  Commonly known as:  PROPECIA  Take 1 mg by mouth every morning.     flecainide 100 MG tablet  Commonly known as:  TAMBOCOR  Take 100 mg by mouth 2 (two) times daily.     furosemide 40 MG tablet  Commonly known as:  LASIX  Take 1 tablet (40 mg total) by mouth daily. Take another 40 mg in the evening     LORazepam 0.5 MG tablet  Commonly known as:  ATIVAN  Take 0.5 mg by mouth at bedtime.     metoprolol succinate 100 MG 24 hr tablet  Commonly known as:  TOPROL-XL  Take 1 tablet (100 mg total) by mouth daily. Take with or immediately following a meal.      oxyCODONE-acetaminophen 5-325 MG per tablet  Commonly known as:  PERCOCET/ROXICET  Take 1-2 tablets by mouth every 4 (four) hours as needed for severe pain.     pantoprazole 40 MG tablet  Commonly known as:  PROTONIX  Take one tab twice daily for one week, then once daily for silent reflux     potassium chloride SA 20 MEQ tablet  Commonly known as:  K-DUR,KLOR-CON  Take 1 tablet (20 mEq total) by mouth daily.     SAW PALMETTO PO  Take 1 tablet by mouth 2 (two) times daily.     Vitamin D 2000 UNITS Caps  Take 2,000 Units by mouth daily.        Follow Up Appointments: Follow-up Information   Follow up with Troy Sine, MD On 05/16/2013. (at 8:15 am.)    Specialty:  Cardiology   Contact information:   [address] [place] [address] [phone]       Follow  up with Gaye Pollack, MD On 05/17/2013. (PA/LAT CXR to be taken (at [place] which is in the same building as Dr. Vivi Martens office) on 05/17/2013 at 9:30 [id number] with Dr. Ricki Miller is at 10:30 am)    Specialty:  Cardiothoracic Surgery   Contact information:   [address] [place] [address] [phone]       Signed: Lars Pinks MPA-C 05/01/2013, 10:41 AM

## 2013-04-28 NOTE — Progress Notes (Addendum)
[address]       [place] [address]             [phone]      3 Days Post-Op  Procedure(s) (LRB): SUBXYPHOID PERICARDIAL WINDOW (N/A) Subjective: Feels well  Objective  Telemetry sinus rhythm  Temp:  [98.7 F (37.1 C)-98.9 F (37.2 C)] 98.8 F (37.1 C) (01/23 0609) Pulse Rate:  [68-83] 68 (01/23 0609) Resp:  [16-18] 16 (01/23 0609) BP: (110-132)/(68-81) 110/68 mmHg (01/23 0609) SpO2:  [93 %-98 %] 98 % (01/23 0609)   Intake/Output Summary (Last 24 hours) at 04/28/13 0740 Last data filed at 04/28/13 0600  Gross per 24 hour  Intake   1680 ml  Output   3470 ml  Net  -1790 ml       General appearance: alert, cooperative and no distress Heart: regular rate and rhythm Lungs: clear to auscultation bilaterally Abdomen: benign Extremities: mild edema Wound: dressing with minimal old drainage  Lab Results: No results found for this basename: NA, K, CL, CO2, GLUCOSE, BUN, CREATININE, CALCIUM, MG, PHOS,  in the last 72 hours No results found for this basename: AST, ALT, ALKPHOS, BILITOT, PROT, ALBUMIN,  in the last 72 hours No results found for this basename: LIPASE, AMYLASE,  in the last 72 hours No results found for this basename: WBC, NEUTROABS, HGB, HCT, MCV, PLT,  in the last 72 hours No results found for this basename: CKTOTAL, CKMB, TROPONINI,  in the last 72 hours No components found with this basename: POCBNP,  No results found for this basename: DDIMER,  in the last 72 hours No results found for this basename: HGBA1C,  in the last 72 hours No results found for this basename: CHOL, HDL, LDLCALC, TRIG, CHOLHDL,  in the last 72 hours No results found for this basename: TSH, T4TOTAL, FREET3, T3FREE, THYROIDAB,  in the last 72 hours No results found for this basename: VITAMINB12, FOLATE, FERRITIN, TIBC, IRON, RETICCTPCT,  in the last 72 hours  Medications: Scheduled . atorvastatin  40 mg Oral q1800  . bisacodyl  10 mg Oral Daily  .  cholecalciferol  2,000 Units Oral Daily  . flecainide  100 mg Oral BID  . LORazepam  0.5 mg Oral QHS  . metoprolol succinate  100 mg Oral QHS  . pantoprazole  40 mg Oral Daily     Radiology/Studies:  Dg Chest Port 1 View  04/27/2013   CLINICAL DATA:  Subxiphoid pericardial window  EXAM: PORTABLE CHEST - 1 VIEW  COMPARISON:  04/25/2013  FINDINGS: Increased interstitial markings, favored to reflect mild interstitial edema. Patchy bibasilar opacities, likely atelectasis. Possible small left pleural effusion. No pneumothorax.  Cardiomegaly.  Stable right IJ venous catheter terminating in the lower SVC.  IMPRESSION: Cardiomegaly with suspected mild interstitial edema and possible small left pleural effusion.  Patchy bibasilar opacities, likely atelectasis.   Electronically Signed   By: Julian Hy M.D.   On: 04/27/2013 08:06    INR: Will add last result for INR, ABG once components are confirmed Will add last 4 CBG results once components are confirmed  Assessment/Plan: S/P Procedure(s) (LRB): SUBXYPHOID PERICARDIAL WINDOW (N/A)  1 chest drain with 120 cc/24 hr, poss d/c soon 2 will give 20mg  lasix today     LOS: 3 days    GOLD,WAYNE E 1/23/20157:40 AM   Chart reviewed, patient examined, agree with above. The drainage has steadily decreased and there was no drainage recorded overnight. I am going to  leave the tube in today and plan to remove it in the am and send him home tomorrow. I will remove the chest tube suture in the office.

## 2013-04-28 NOTE — Progress Notes (Signed)
Patient ambulated in hallway with cardiac rehab; patient transferred to [id number] out of [facility]; report given to RN; patient in chair with call bell within reach.  BARNETT, Marsh Dolly

## 2013-04-28 NOTE — Telephone Encounter (Signed)
Left message on [employer] voicemail on 04/21/13  Concerning written information from Dr Claiborne Billings

## 2013-04-28 NOTE — Progress Notes (Signed)
CARDIAC REHAB PHASE I   PRE:  Rate/Rhythm: 64 SR    BP: sitting 110/66    SaO2: 97 RA  MODE:  Ambulation: 2400 ft   POST:  Rate/Rhythm: 78 SR    BP: sitting 125/66     SaO2: 96 RA  Tolerated very well, no c/o, conversed the entire walk. Used w/c to hold CT, did not need for stability. Ed completed. Pt feels CRPII would not work with his schedule but took brochure for if something changed.  [id number]   [name] [name] [place] CES, ACSM 04/28/2013 12:04 PM

## 2013-04-29 ENCOUNTER — Other Ambulatory Visit: Payer: Self-pay | Admitting: Cardiovascular Disease

## 2013-04-29 DIAGNOSIS — Z9889 Other specified postprocedural states: Secondary | ICD-10-CM

## 2013-04-29 MED ORDER — POTASSIUM CHLORIDE CRYS ER 20 MEQ PO TBCR: 20.0000 meq | EXTENDED_RELEASE_TABLET | Freq: Every day | ORAL | Status: DC

## 2013-04-29 MED ORDER — FUROSEMIDE 40 MG PO TABS: 40.0000 mg | ORAL_TABLET | Freq: Every day | ORAL | Status: DC

## 2013-04-29 MED ORDER — OXYCODONE-ACETAMINOPHEN 5-325 MG PO TABS: 1.0000 | ORAL_TABLET | ORAL | Status: DC | PRN

## 2013-04-29 NOTE — Progress Notes (Signed)
CARDIAC REHAB PHASE I   PRE:  Rate/Rhythm: 67 sinus  BP:  Sitting: 124/62     MODE:  Ambulation: 1200 ft   POST:  Rate/Rhythem: 75  BP:  Sitting: 132/56     SaO2: 96 RA  Pt ambulated 1200 ft pushing wheelchair with assist x1.  Pt tolerated walk well, stated that it made his back feel better moving around.  Pt returned to bedside with call bell in reach.  Pt had no f/u questions from d/c education yesterday.  Pt stated that he would consider Phase II rehab once he has his wrist/carpel tunnel resolved.  Encouraged pt to exercise on his own in mean time.  Pt voiced understanding. Alberteen Sam, MA, ACSM RCEP   Luan Pulling, Nita Sells

## 2013-04-29 NOTE — Progress Notes (Signed)
Removed chest tube per MD order per hospital policy. Patient tolerated well, tube intact upon removal and CT sutures tied and covered with Vaseline gauze/gauze and Hyperfix tape. Patient reminded to notify nursing for any SOB. Patient resting comfortably in bed and eating lunch. Glade Nurse, RN

## 2013-04-29 NOTE — Progress Notes (Signed)
      Patient Name: [name] [name] Date of Encounter: 04/29/2013  Principal Problem:   Pericardial effusion (noninflammatory) Active Problems:   PAF (paroxysmal atrial fibrillation), maintaining SR, last DCCV 02/08/14   Sleep apnea   S/P pericardial operation, 04/25/12, window    SUBJECTIVE: Doing better. No CP, no SOB. Pericardial window, tube to be removed today.   OBJECTIVE Filed Vitals:   04/28/13 0609 04/28/13 1300 04/28/13 2010 04/29/13 0648  BP: 110/68 144/83 111/69 139/88  Pulse: 68 74 71 74  Temp: 98.8 F (37.1 C) 98.7 F (37.1 C) 97.6 F (36.4 C) 98 F (36.7 C)  TempSrc:  Oral Oral Oral  Resp: 16 20 20 20   Height:      Weight:      SpO2: 98% 98% 95% 97%    Intake/Output Summary (Last 24 hours) at 04/29/13 0957 Last data filed at 04/29/13 0700  Gross per 24 hour  Intake    240 ml  Output   1655 ml  Net  -1415 ml   Filed Weights   04/25/13 1605  Weight: 220 lb (99.791 kg)   PHYSICAL EXAM General: Well developed, well nourished, male in no acute distress. Head: Normocephalic, atraumatic.  Neck: Supple without bruits, JVD not elevated on left, right bandaged and dressed. Lungs:  Resp regular and unlabored, CTA. Heart: RRR, S1, S2, no S3, S4, or murmur; no significant rub. Tube in place.  Abdomen: Soft, non-tender, non-distended, BS + x 4.  Extremities: No clubbing, cyanosis, no edema.  Neuro: Alert and oriented X 3. Moves all extremities spontaneously. Psych: Normal affect.  TELE:  NSR, no Afib  Radiology/Studies: Dg Chest Port 1 View 04/27/2013   CLINICAL DATA:  Subxiphoid pericardial window  EXAM: PORTABLE CHEST - 1 VIEW  COMPARISON:  04/25/2013  FINDINGS: Increased interstitial markings, favored to reflect mild interstitial edema. Patchy bibasilar opacities, likely atelectasis. Possible small left pleural effusion. No pneumothorax.  Cardiomegaly.  Stable right IJ venous catheter terminating in the lower SVC.  IMPRESSION: Cardiomegaly with suspected  mild interstitial edema and possible small left pleural effusion.  Patchy bibasilar opacities, likely atelectasis.   Electronically Signed   By: Julian Hy M.D.   On: 04/27/2013 08:06    Current Medications:  . atorvastatin  40 mg Oral q1800  . bisacodyl  10 mg Oral Daily  . cholecalciferol  2,000 Units Oral Daily  . flecainide  100 mg Oral BID  . LORazepam  0.5 mg Oral QHS  . metoprolol succinate  100 mg Oral QHS  . pantoprazole  40 mg Oral Daily      ASSESSMENT AND PLAN:    Pericardial effusion (noninflammatory) - s/p window 04/25/12, draining decreased,  removal 01/24, per TCTS  Active Problems:   PAF (paroxysmal atrial fibrillation), maintaining SR, last DCCV 02/08/14 - still in SR, on flecainide    Sleep apnea - on CPAP    S/P pericardial operation, 04/25/12, window     Chronic anticoagulation - w/ Pradaxa, on hold, restart when OK with TCTS  Generally doing well, d/c when medically stable per TCTS, f/u appt with Dr. Claiborne Billings made in 2 weeks. Patient very anxious to have carpel tunnel surgery. Will need to discuss with Dr. Claiborne Billings.  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   Levon Hedger, [facility] 04/29/2013 9:57 AM

## 2013-04-29 NOTE — Progress Notes (Signed)
      [address]       [place] [address]             [phone]      4 Days Post-Op  Procedure(s) (LRB): SUBXYPHOID PERICARDIAL WINDOW (N/A) Subjective: Feels well  Objective  Telemetry sinus rhythm   Temp:  [97.6 F (36.4 C)-98.7 F (37.1 C)] 98 F (36.7 C) (01/24 0648) Pulse Rate:  [71-74] 74 (01/24 0648) Resp:  [20] 20 (01/24 0648) BP: (111-144)/(69-88) 139/88 mmHg (01/24 0648) SpO2:  [95 %-98 %] 97 % (01/24 0648)   Intake/Output Summary (Last 24 hours) at 04/29/13 0742 Last data filed at 04/29/13 0700  Gross per 24 hour  Intake    480 ml  Output   1930 ml  Net  -1450 ml       General appearance: alert, cooperative and no distress Heart: regular rate and rhythm Lungs: clear to auscultation bilaterally Abdomen: benign Extremities: min edema Wound: incis healing well  Lab Results: No results found for this basename: NA, K, CL, CO2, GLUCOSE, BUN, CREATININE, CALCIUM, MG, PHOS,  in the last 72 hours No results found for this basename: AST, ALT, ALKPHOS, BILITOT, PROT, ALBUMIN,  in the last 72 hours No results found for this basename: LIPASE, AMYLASE,  in the last 72 hours No results found for this basename: WBC, NEUTROABS, HGB, HCT, MCV, PLT,  in the last 72 hours No results found for this basename: CKTOTAL, CKMB, TROPONINI,  in the last 72 hours No components found with this basename: POCBNP,  No results found for this basename: DDIMER,  in the last 72 hours No results found for this basename: HGBA1C,  in the last 72 hours No results found for this basename: CHOL, HDL, LDLCALC, TRIG, CHOLHDL,  in the last 72 hours No results found for this basename: TSH, T4TOTAL, FREET3, T3FREE, THYROIDAB,  in the last 72 hours No results found for this basename: VITAMINB12, FOLATE, FERRITIN, TIBC, IRON, RETICCTPCT,  in the last 72 hours  Medications: Scheduled . atorvastatin  40 mg Oral q1800  . bisacodyl  10 mg Oral Daily  . cholecalciferol  2,000  Units Oral Daily  . flecainide  100 mg Oral BID  . LORazepam  0.5 mg Oral QHS  . metoprolol succinate  100 mg Oral QHS  . pantoprazole  40 mg Oral Daily    CHEST TUBE: 80 CC IN 24/HR    Radiology/Studies:  No results found.  INR: Will add last result for INR, ABG once components are confirmed Will add last 4 CBG results once components are confirmed  Assessment/Plan: S/P Procedure(s) (LRB): SUBXYPHOID PERICARDIAL WINDOW (N/A)  1 doing well, remove chest drain and discharge home today    LOS: 4 days    Ric Rosenberg E 1/24/20157:42 AM

## 2013-04-29 NOTE — Discharge Planning (Signed)
Discussed follow-up appointments, medications and my chart instructions on discharge summary. Answered all questions and arranged for transportation to [place]. Glade Nurse, RN

## 2013-05-01 ENCOUNTER — Telehealth: Payer: Self-pay | Admitting: Cardiovascular Disease

## 2013-05-01 NOTE — Telephone Encounter (Signed)
Message forwarded to Dr. Kelly/Wanda, CMA.  

## 2013-05-01 NOTE — Telephone Encounter (Signed)
She saiys she is stilll waiting for a medical clarence.Pt wass discharged from the hospital on Sat, he said Dr Berle Mull aid he could have surgery ion 05-11-12. All she need is a note from Dr Claiborne Billings stating that it is okay.

## 2013-05-01 NOTE — Telephone Encounter (Signed)
Rx was sent to pharmacy electronically. 

## 2013-05-02 NOTE — Telephone Encounter (Signed)
Per Mariann Laster, CMA, send to Dr. Claiborne Billings.

## 2013-05-02 NOTE — Telephone Encounter (Signed)
Will defer to Mariann Laster to call pt.

## 2013-05-02 NOTE — Telephone Encounter (Signed)
Please have Mariann Laster to call him-concerning his surgery.He is waiting for Dr Claiborne Billings to approve it,he doesn't know wihy it have not happen.

## 2013-05-02 NOTE — Telephone Encounter (Signed)
Will need to review Dr. Vivi Martens notes; but probably OK for surgery/ will sign clearance tomorrow while in office.

## 2013-05-02 NOTE — Telephone Encounter (Signed)
Spoke with Dr. Claiborne Billings last night informing him of this phone call, and that I have put the chart along with copies of the notes from the cardiothoracic surgeons on his chair. He states that he's unsure if okay to clear the patient. He will discuss with me on next office day. Informed him this message has also been sent to his in basket.

## 2013-05-05 NOTE — Addendum Note (Signed)
Addendum created 05/05/13 1112 by Laurie Panda, MD   Modules edited: Anesthesia Blocks and Procedures, Anesthesia LDA

## 2013-05-08 ENCOUNTER — Other Ambulatory Visit: Payer: Self-pay | Admitting: *Deleted

## 2013-05-08 DIAGNOSIS — I319 Disease of pericardium, unspecified: Secondary | ICD-10-CM

## 2013-05-09 ENCOUNTER — Encounter: Payer: Self-pay | Admitting: Cardiovascular Disease

## 2013-05-09 ENCOUNTER — Ambulatory Visit (INDEPENDENT_AMBULATORY_CARE_PROVIDER_SITE_OTHER): Payer: Medicare Other | Admitting: Cardiovascular Disease

## 2013-05-09 ENCOUNTER — Telehealth: Payer: Self-pay | Admitting: Cardiovascular Disease

## 2013-05-09 VITALS — BP 110/80 | HR 100 | Ht 74.0 in | Wt 215.9 lb

## 2013-05-09 DIAGNOSIS — I4891 Unspecified atrial fibrillation: Secondary | ICD-10-CM

## 2013-05-09 DIAGNOSIS — Z9889 Other specified postprocedural states: Secondary | ICD-10-CM

## 2013-05-09 DIAGNOSIS — I3139 Other pericardial effusion (noninflammatory): Secondary | ICD-10-CM

## 2013-05-09 DIAGNOSIS — G4730 Sleep apnea, unspecified: Secondary | ICD-10-CM

## 2013-05-09 DIAGNOSIS — I10 Essential (primary) hypertension: Secondary | ICD-10-CM

## 2013-05-09 DIAGNOSIS — I319 Disease of pericardium, unspecified: Secondary | ICD-10-CM

## 2013-05-09 DIAGNOSIS — Z7901 Long term (current) use of anticoagulants: Secondary | ICD-10-CM

## 2013-05-09 DIAGNOSIS — I313 Pericardial effusion (noninflammatory): Secondary | ICD-10-CM

## 2013-05-09 NOTE — Patient Instructions (Signed)
Your physician has recommended you make the following change in your medication: increase the toprol to 100 mg in the morning, and 50 mg in the evening.  Continue all other meds as directed. Your physician recommends that you schedule a follow-up appointment in: 2 weeks.

## 2013-05-09 NOTE — Telephone Encounter (Signed)
Message forwarded to W. Waddell, CMA.  

## 2013-05-09 NOTE — Telephone Encounter (Signed)
Pharmacist said you need to contact his insurance to approve aa 90 day supply of Pradaxa. Please [phone]

## 2013-05-09 NOTE — Progress Notes (Signed)
Patient ID: [name] [name], male   DOB: 1945/12/17, 68 y.o.   MRN: [id number]      HPI: MAN MOM is a 68 y.o. male who presents to the office today in followup of his recent pericardial window for chronic pericardial effusion.  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  He has a history of paroxysmal atrial fibrillation. He has undergone 2 previous ablation procedures by Dr. Tally Due in 2002 and in 2009. He has been documented to have a pericardial effusion since 2007. At that time, his pericardial effusion was felt to be small in 2012, the patient had a recurrent episode of atrial fibrillation and since that time has been on pradaxa anticoagulation in addition to flecanide I 100 mg twice a day. He had been maintaining sinus rhythm until recently after his return from [place] where he spent the summer performing a production. Of note, while [place] he did fall and had surgery on his left hand. He has been bothered with recurrent discomfort and may need carpal tunnel surgery subsequently. He had developed recurrent atrial fibrillation again and underwent successful cardioversion by me on 02/08/2013 and had maintained sinus rhythm since that time.  [name] has developed progressive shortness of breath. 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  He has been unable to lie flat in bed due to shortness of breath. He had seen Dr. Rayann Heman and lower in the old subsequent to his cardioversion. A recent echo Doppler study on 03/06/2013 now showed a moderate pericardial effusion. There was mild LVH with normal systolic function. His left atrium was mildly dilated and his right atrium was upper normal in size. When last seen by Garen Lah in our office one week ago, his diuretic regimen was further  increased to Lasix 80 mg in the morning and 40 mg in the afternoon. He states this is intermittently worked in some instances has felt improved with reference to shortness of breath but he still notes swelling., At times he feels that his shortness of breath is becoming more progressive. He subsequently underwent a CT scan of his chest area he previously was noted to have a mild ascending aortic aneurysm. His ascending thoracic aorta on this most recent study measured 4.0 cm in maximum transverse diameter. He was noted to have a moderate-sized pericardial effusion which is clearly increased from 2007. There is no evidence for pulmonary emboli. He also had small probable cysts in the liver and is a small hyperenhancing liver mass.  He recently underwent a right and left heart catheterization to evaluate for possible constrictive pericarditis versus restrictive physiology on 03/17/2013. There was no significant coronary obstructive disease with essentially normal coronary arteries only minimal luminal irregularity of the proximal LAD. And normal to low normal global LV function without focal segmental wall motion abnormalities with ejection fraction of 50-55%. There is no evidence for constrictive pericarditis with a low RVEDP pressure, no evidence for early diastolic rapid Y. descent following a moderate fluid challenge and there is no evidence for equalization of LVEDP and RVEDP. However, because of his moderate-sized pericardial effusion health and was referred for a pericardial window which was recently done by Dr. Cyndia Bent successfully on 04/25/2013.   Subsequently, his  breathing pattern has markedly improved. He is now able to lie  flat without dyspnea. He is able to have a full breath. He is not been using his CPAP.  [name] has had continued pain with reference to his hand injury which he sustained in [place]. He is anxiously awaiting elective surgery to to have this repaired. He presents for  evaluation prior to getting operative consent.  Past Medical History  Diagnosis Date  . Paroxysmal atrial fibrillation     s/p afib 2001 and 2009 by Dr Rolland Porter  . Coronary artery disease     no significant CAD by cath 5/12  . Cardiomyopathy     hx of tachycardia mediated CM, now resolved  . Hypertension   . Hyperlipidemia   . Thoracic aortic aneurysm   . Depression   . Sleep apnea     AHI avg 5/hr  . Limb pain 06/20/2007    LE venous doppler - no evidence of thrombus or thrombophlebitis  . PVC (premature ventricular contraction) 12/18/2009    cardionet monitor 21 days - some PVCs, ventricular bigeminy  . Pericardial effusion   . Echocardiogram abnormal     EF60%, mild LVH, PA pk pressure 35, pericardial effusion mild increase from 2012   . Shortness of breath   . Complication of anesthesia     Pt has anxiety issues and thinks he is going to die.  . S/P pericardial operation, 04/25/12, window 04/26/2013    Past Surgical History  Procedure Laterality Date  . Cardiac catheterization  08/25/10    NO SIGNIFICANT CAD  . Cardioversion  10/18/2007    successful  . Elbow / upper arm foreign body removal    . Incision and drainage of wound  09/07/2011    Procedure: IRRIGATION AND DEBRIDEMENT WOUND;  Surgeon: Kerin Salen, MD;  Location: [facility];  Service: Orthopedics;  Laterality: Left;  . Transesophageal echocardiogram  08/26/2010    see Notes tab  . Cardiovascular stress test  09/08/2005    R/S MV - EF 61%; Exercises capacity 12 Mets; essentially normal perfusion myocardial scan    . Cardiac electrophysiology mapping and ablation  [id number]    Dr. Tally Due  . Osteosynthesis Left 09/30/2012    with small plate in the ulna  . Cardioversion N/A 02/08/2013    Procedure: CARDIOVERSION;  Surgeon: Troy Sine, MD;  Location: [facility] ENDOSCOPY;  Service: Cardiovascular;  Laterality: N/A;  . Fracture surgery Left     Left wrist  . Subxyphoid pericardial window N/A 04/25/2013    Procedure:  SUBXYPHOID PERICARDIAL WINDOW;  Surgeon: Gaye Pollack, MD;  Location: [facility] OR;  Service: Thoracic;  Laterality: N/A;    Allergies  Allergen Reactions  . Tetracyclines & Related Hives    Current Outpatient Prescriptions  Medication Sig Dispense Refill  . atorvastatin (LIPITOR) 40 MG tablet Take 40 mg by mouth daily.      . Cholecalciferol (VITAMIN D) 2000 UNITS CAPS Take 2,000 Units by mouth daily.      . finasteride (PROPECIA) 1 MG tablet Take 1 mg by mouth every morning.       . flecainide (TAMBOCOR) 100 MG tablet Take 100 mg by mouth 2 (two) times daily.      . furosemide (LASIX) 40 MG tablet Take 1 tablet (40 mg total) by mouth daily. Take another 40 mg in the evening  30 tablet  0  . LORazepam (ATIVAN) 0.5 MG tablet Take 0.5 mg by mouth at bedtime.       . metoprolol succinate (TOPROL-XL) 100 MG 24  hr tablet Take 1 tablet (100 mg total) by mouth daily. Take with or immediately following a meal.  90 tablet  4  . oxyCODONE-acetaminophen (PERCOCET/ROXICET) 5-325 MG per tablet Take 1-2 tablets by mouth every 4 (four) hours as needed for severe pain.  30 tablet  0  . pantoprazole (PROTONIX) 40 MG tablet Take one tab twice daily for one week, then once daily for silent reflux  90 tablet  2  . potassium chloride SA (K-DUR,KLOR-CON) 20 MEQ tablet Take 1 tablet (20 mEq total) by mouth daily.  30 tablet  0  . PRADAXA 150 MG CAPS capsule TAKE ONE CAPSULE BY MOUTH TWICE A DAY  180 capsule  3  . Saw Palmetto, Serenoa repens, (SAW PALMETTO PO) Take 1 tablet by mouth 2 (two) times daily.       No current facility-administered medications for this visit.    History   Social History  . Marital Status: Single    Spouse Name: N/A    Number of Children: 0  . Years of Education: N/A   Occupational History  .  [employer] History Main Topics  . Smoking status: Former Smoker -- 1.00 packs/day for 10 years    Quit date: 02/06/1989  . Smokeless tobacco: Never Used  . Alcohol Use:  Yes     Comment: 2 bottles of wine per week  . Drug Use: No  . Sexual Activity: Not on file   Other Topics Concern  . 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    Family History  Problem Relation Age of Onset  . Coronary artery disease Father   . Stroke Mother     died  . Atrial fibrillation Brother   . Heart attack Father   . Mitral valve prolapse Sister     ROS is negative for fevers, chills or night sweats. He denies rash. He denies visual changes. He denies change in hearing. He is unaware of lymphadenopathy. His shortness of breath with activity as well as lying flat has resolved following his pericardial window.  A previous cardiac catheterization had shown mild luminal irregularities of his coronary anatomy. He denies any prolonged episode of chest pressure. He states probably 4 days ago he noticed some irregularity to his heart rhythm. He denies nausea vomiting or diarrhea. He denies blood in stool or urine. He continues to have difficulty with his left hand.  He continues to note minimal ankle swelling bilaterally left leg greater than right. He denies tremors. There is no diabetes   Other comprehensive 14 point system review is negative.  PE BP: 110/80  P 100  WT 215   6'2" No obvious pulsus paradoxicus General: Alert, oriented, no distress.  Skin: normal turgor, no rashes HEENT: Normocephalic, atraumatic. Pupils round and reactive; sclera anicteric;no lid lag.  Nose without nasal septal hypertrophy Mouth/Parynx benign; Mallinpatti scale 3 Neck: No JVD, no carotid briuts; no hepatojugular reflux jugular reflux Lungs: clear to ausculatation and percussion; no wheezing or rales Heart: Irregularly irregular with ventricular rate approximately 100 compatible with recurrent atrial  fibrillation, s1 s2 normal soft friction rub, 1/6 systolic murmur at the apex. Abdomen: soft, nontender; no hepatosplenomehaly, BS+; abdominal aorta nontender and not dilated by palpation. Pulses 2+ Extremities: no  clubbing cyanosis or edema, Homan's sign negative  Neurologic: grossly nonfocal Psychologic: normal affect, very frustrated with his recurrent AF  ECG (independently read by me): Atrial fibrillation at 100 beats per minute with nonspecific ST-T changes.   Prior ECG :Sinus rhythm at 90 beats per minute. Poor anterior R-wave progression V1 through V4 with QRS complex. Mild left axis deviation.  LABS:  BMET    Component Value Date/Time   NA 139 04/24/2013 1409   K 4.2 04/24/2013 1409   CL 100 04/24/2013 1409   CO2 22 04/24/2013 1409   GLUCOSE 112* 04/24/2013 1409   BUN 27* 04/24/2013 1409   CREATININE 1.05 04/24/2013 1409   CREATININE 1.07 03/14/2013 1617   CALCIUM 9.4 04/24/2013 1409   GFRNONAA 72* 04/24/2013 1409   GFRAA 83* 04/24/2013 1409     Hepatic Function Panel     Component Value Date/Time   PROT 7.0 04/24/2013 1409   ALBUMIN 4.1 04/24/2013 1409   AST 28 04/24/2013 1409   ALT 30 04/24/2013 1409   ALKPHOS 76 04/24/2013 1409   BILITOT 0.4 04/24/2013 1409     CBC    Component Value Date/Time   WBC 7.0 04/24/2013 1409   RBC 4.27 04/24/2013 1409   HGB 12.7* 04/24/2013 1409   HCT 37.5* 04/24/2013 1409   PLT 206 04/24/2013 1409   MCV 87.8 04/24/2013 1409   MCH 29.7 04/24/2013 1409   MCHC 33.9 04/24/2013 1409   RDW 13.7 04/24/2013 1409   LYMPHSABS 2.3 02/06/2013 1355   MONOABS 0.8 02/06/2013 1355   EOSABS 0.2 02/06/2013 1355   BASOSABS 0.0 02/06/2013 1355     BNP    Component Value Date/Time   PROBNP 58.97 03/14/2013 1615    Lipid Panel     Component Value Date/Time   CHOL  Value: 188        ATP III CLASSIFICATION:  <200     mg/dL   Desirable  200-239  mg/dL   Borderline High  >=240    mg/dL   High 08/23/2007 0910   TRIG 63 08/23/2007 0910   HDL 47 08/23/2007 0910    CHOLHDL 4.0 08/23/2007 0910   VLDL 13 08/23/2007 0910   LDLCALC  Value: 128        Total Cholesterol/HDL:CHD Risk Coronary Heart Disease Risk Table                     Men   Women  1/2 Average Risk   3.4   3.3* 08/23/2007 0910     RADIOLOGY: Dg Chest 2 View  03/10/2013   CLINICAL DATA:  Short of breath  EXAM: CHEST  2 VIEW  COMPARISON:  08/22/2010  FINDINGS: Mild cardiac enlargement. Negative for heart failure. Lungs are clear without infiltrate effusion or mass.  IMPRESSION: No active cardiopulmonary disease.   Electronically Signed   By: Franchot Gallo M.D.   On: 03/10/2013 15:53   Ct Angio Chest W/cm &/or Wo Cm  03/10/2013   CLINICAL DATA:  Shortness of breath. Ascending thoracic aortic aneurysm. Pericardial effusion.  EXAM: CT ANGIOGRAPHY CHEST WITH CONTRAST  TECHNIQUE: Multidetector CT imaging of the chest was performed using the standard protocol during bolus administration of intravenous contrast. Multiplanar CT image reconstructions including MIPs were obtained to evaluate the vascular anatomy.  CONTRAST:  120mL OMNIPAQUE IOHEXOL 350 MG/ML SOLN  COMPARISON:  Chest radiographs obtained earlier today and chest CT dated 09/17/2005.  FINDINGS: The ascending thoracic aorta remains borderline enlarged, measuring 4.0 cm  in maximum transverse diameter on image number 158 of series 5. Moderate sized pericardial effusion measuring 17.5 mm in thickness on image number 79 of series 4. Normally opacified pulmonary arteries. No pulmonary arterial filling defects are seen. Clear lungs. No lung nodules or enlarged lymph nodes. 8 mm probable cyst in the superior aspect of the lateral segment of the left lobe of the liver on image number 85 and 11 mm probable cyst in the lateral segment of the left lobe of the liver on image number 93, both mildly increased in size since the previous examination. There is also a 5 mm rounded hyper enhancing mass in the anterior aspect of the lateral segment of the left lobe of the  liver on image number 89, not definitely seen previously. Thoracic spine degenerative changes.  Review of the MIP images confirms the above findings.  IMPRESSION: 1. Moderate-sized pericardial effusion, increased in size. 2. Stable borderline aneurysmal dilatation of the ascending thoracic aorta. 3. No pulmonary emboli. 4. Small probable cysts in the liver and small hyper enhancing liver mass, as described above.   Electronically Signed   By: Enrique Sack M.D.   On: 03/10/2013 15:50      ASSESSMENT AND PLAN:  I had a long discussion with Mr. Talamo. 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 His ejection fraction susceptibly improved. He is status post prior atrial fibrillation  ablations with his last being done in 2009. He developed chronic per cardial effusion dating back to 2007. He is now approximately 2 weeks since his subxiphoid pericardial window. This has resulted in significant improvement in his prior shortness of breath. He now can be supine. He is able to take a full breath without a "catch. ". Apparently he has not been on per Dr. anticoagulation since his pericardial window surgery. His EKG today confirms that he is back in April fibrillation. By clinical history I suspect this is approximately at least 4 days in duration if not longer. I've given him samples of per data to initiate now and take 150 mg twice a day. He hours he will need to defer his hand surgery. I'm increasing his Toprol-XL to 100 mg in the morning and 50 mg at night. He is continuing to take his flecainide. In the past, he was intolerant to amiodarone. He states he could not tolerate Tikosyn. He is very frustrated that he cannot get his hand operated on. I will see him back in the office in 2 weeks. If he still atrial fibrillation, plans were made for attempt at cardioversion.   Troy Sine, MD, [facility]  05/09/2013 8:06 AM

## 2013-05-10 ENCOUNTER — Ambulatory Visit (INDEPENDENT_AMBULATORY_CARE_PROVIDER_SITE_OTHER): Payer: Self-pay | Admitting: Surgery

## 2013-05-10 ENCOUNTER — Encounter: Payer: Self-pay | Admitting: Surgery

## 2013-05-10 ENCOUNTER — Ambulatory Visit
Admission: RE | Admit: 2013-05-10 | Discharge: 2013-05-10 | Disposition: A | Discharge status: Home / Self Care | Payer: Medicare Other | Source: Ambulatory Visit | Attending: Surgery | Admitting: Surgery

## 2013-05-10 VITALS — BP 121/78 | HR 61 | Resp 20 | Ht 74.0 in | Wt 215.0 lb

## 2013-05-10 DIAGNOSIS — Z09 Encounter for follow-up examination after completed treatment for conditions other than malignant neoplasm: Secondary | ICD-10-CM

## 2013-05-10 DIAGNOSIS — I3139 Other pericardial effusion (noninflammatory): Secondary | ICD-10-CM

## 2013-05-10 DIAGNOSIS — I319 Disease of pericardium, unspecified: Secondary | ICD-10-CM

## 2013-05-10 DIAGNOSIS — I313 Pericardial effusion (noninflammatory): Secondary | ICD-10-CM

## 2013-05-10 NOTE — Telephone Encounter (Signed)
Message forwarded to Wanda, CMA.  

## 2013-05-10 NOTE — Progress Notes (Signed)
HPI:  Patient returns for routine postoperative follow-up having undergone sub-xyphoid pericardial window on 04/25/2013. The patient's early postoperative recovery while in the hospital was notable for an uncomplicated postop course. Since hospital discharge the patient reports that he has been feeling well with no dyspnea or orthopnea which he had before. He did go back into A-fib recently and saw Dr. Claiborne Billings. He was started back on Pradaxa and plans have been made for DCCV in a few weeks.   Current Outpatient Prescriptions  Medication Sig Dispense Refill  . atorvastatin (LIPITOR) 40 MG tablet Take 40 mg by mouth daily.      . Cholecalciferol (VITAMIN D) 2000 UNITS CAPS Take 2,000 Units by mouth daily.      . finasteride (PROPECIA) 1 MG tablet Take 1 mg by mouth every morning.       . flecainide (TAMBOCOR) 100 MG tablet Take 100 mg by mouth 2 (two) times daily.      . furosemide (LASIX) 40 MG tablet Take 40 mg by mouth 2 (two) times daily. Take another 40 mg in the evening      . LORazepam (ATIVAN) 0.5 MG tablet Take 0.5 mg by mouth at bedtime.       . metoprolol succinate (TOPROL-XL) 100 MG 24 hr tablet Take 100 mg by mouth daily. Takes 100 mg every AM and 50 mg every PM      . oxyCODONE-acetaminophen (PERCOCET/ROXICET) 5-325 MG per tablet Take 1-2 tablets by mouth every 4 (four) hours as needed for severe pain.  30 tablet  0  . pantoprazole (PROTONIX) 40 MG tablet Take one tab twice daily for one week, then once daily for silent reflux  90 tablet  2  . potassium chloride SA (K-DUR,KLOR-CON) 20 MEQ tablet Take 1 tablet (20 mEq total) by mouth daily.  30 tablet  0  . PRADAXA 150 MG CAPS capsule TAKE ONE CAPSULE BY MOUTH TWICE A DAY  180 capsule  3  . Saw Palmetto, Serenoa repens, (SAW PALMETTO PO) Take 1 tablet by mouth 2 (two) times daily. 900 MG       No current facility-administered medications for this visit.    Physical Exam: BP 121/78  Pulse 61  Resp 20  Ht 6\' 2"  (1.88 m)   Wt 215 lb (97.523 kg)  BMI 27.59 kg/m2  SpO2 96% He looks well Lungs are clear Cardiac exam shows an irregular rate and rhythm. The incision is healing well. The chest tube incision was removed.  Diagnostic Tests:  CLINICAL DATA: Hypertension, pericardial disease  EXAM:  CHEST 2 VIEW  COMPARISON: Prior chest x-ray 04/27/2013; CT PE study 03/10/2013  FINDINGS:  Interval resolution of enlargement of the cardiopericardial  silhouette. Trace atherosclerotic calcification in the transverse  aorta. Interval resolution of a pleural effusions, and a background  pulmonary vascular congestion. No focal airspace consolidation or  evidence of a pneumothorax. No suspicious pulmonary nodule. No acute  osseous abnormality. Mild degenerative spurring in the mid thoracic  spine.  IMPRESSION:  1. No evidence of active cardiopulmonary process.  2. Significant interval reduction in the size of the  cardiopericardial silhouette likely reflecting resolution of the  pericardial effusion.  Electronically Signed  By: Jacqulynn Cadet M.D.  On: 05/10/2013 14:33   Impression:  He is doing well after surgery with resolution of his exertional dyspnea and orthopnea. He is back in A-fib and this is being managed by Dr. Claiborne Billings. He has had 2 prior ablations. I asked  him not to do any heavy lifting for 3 months postop but he can otherwise get back to normal activity as he feels able to.   Plan:  He will follow-up with Dr. Claiborne Billings and will call me if he has any problems with his incisions.

## 2013-05-10 NOTE — Telephone Encounter (Signed)
RECEIVED A PAPER FROM [facility] -stating prior auth. Is needed for quantity exception for 180 tablets for 3 months.    Spoke to rep [facility]- information given  -reason (Patient travels a great deal and would like to get medication every 3 months.)  [phone]   REP.states the information has to go to a pharmacy to review ,and will send response by fax    Prior OIN #[id number]  Will inform Patient of the process.

## 2013-05-11 ENCOUNTER — Telehealth: Payer: Self-pay | Admitting: *Deleted

## 2013-05-11 NOTE — Telephone Encounter (Signed)
Notified [facility]- spoke to [employer] -informed her of approval for 90 day supply Pradaxa.

## 2013-05-11 NOTE — Telephone Encounter (Signed)
Spoke with [employer] @ [facility] to see if she could review patients chart so that we can try to find out what he is requesting in reference to his CPAP supplies. Patient has called and left a message about ? Nasal pillows. I have no recall of what he is referring to. Gwinda Passe will call to see if she can help the patient. She will call me back if she needs anything from Dr. Claiborne Billings.

## 2013-05-11 NOTE — Telephone Encounter (Signed)
Left message on patient voice mail -- Pradaxa has been approve for 90 day supply  For 1 year (05/10/14)

## 2013-05-16 ENCOUNTER — Ambulatory Visit: Payer: Medicare Other | Admitting: Cardiovascular Disease

## 2013-05-17 ENCOUNTER — Ambulatory Visit: Payer: Medicare Other | Admitting: Surgery

## 2013-05-24 ENCOUNTER — Ambulatory Visit (INDEPENDENT_AMBULATORY_CARE_PROVIDER_SITE_OTHER): Payer: Medicare Other | Admitting: Cardiovascular Disease

## 2013-05-24 ENCOUNTER — Encounter: Payer: Self-pay | Admitting: Cardiovascular Disease

## 2013-05-24 VITALS — BP 108/72 | HR 133 | Ht 74.0 in | Wt 216.1 lb

## 2013-05-24 DIAGNOSIS — M6281 Muscle weakness (generalized): Secondary | ICD-10-CM

## 2013-05-24 DIAGNOSIS — I10 Essential (primary) hypertension: Secondary | ICD-10-CM

## 2013-05-24 DIAGNOSIS — I4891 Unspecified atrial fibrillation: Secondary | ICD-10-CM

## 2013-05-24 DIAGNOSIS — Z7901 Long term (current) use of anticoagulants: Secondary | ICD-10-CM

## 2013-05-24 DIAGNOSIS — Z9889 Other specified postprocedural states: Secondary | ICD-10-CM

## 2013-05-24 DIAGNOSIS — R29898 Other symptoms and signs involving the musculoskeletal system: Secondary | ICD-10-CM

## 2013-05-24 DIAGNOSIS — G4730 Sleep apnea, unspecified: Secondary | ICD-10-CM

## 2013-05-24 MED ORDER — METOPROLOL SUCCINATE ER 100 MG PO TB24: ORAL_TABLET | ORAL | Status: DC

## 2013-05-24 NOTE — Progress Notes (Signed)
Patient ID: [name] [name], male   DOB: 11-18-1945, 68 y.o.   MRN: [id number]      HPI: [name] [name] is a 68 y.o. male who presents to the office today in followup of his recent recurrent atrial fibrillation following his subxiphoid window for chronic pericardial effusion.  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  He has a history of paroxysmal atrial fibrillation. He has undergone 2 previous ablation procedures by Dr. Tally Due in 2002 and in 2009. He has been documented to have a pericardial effusion since 2007. At that time, his pericardial effusion was felt to be small in 2012, the patient had a recurrent episode of atrial fibrillation and since that time has been on pradaxa anticoagulation in addition to flecanide I 100 mg twice a day. He had been maintaining sinus rhythm until recently after his return from [place] where he spent the summer performing a production. Of note, while [place] he did fall and had surgery on his left hand. He has been bothered with recurrent discomfort and may need carpal tunnel surgery subsequently. He had developed recurrent atrial fibrillation again and underwent successful cardioversion by me on 02/08/2013 and had maintained sinus rhythm since that time.  [name] [name] has developed progressive shortness of breath. 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  He had been unable to lie flat in bed due to shortness of breath. He had seen Dr. Rayann Heman  subsequent to his cardioversion. An echo Doppler study on 03/06/2013 showed a moderate pericardial effusion. There was mild LVH with normal systolic function. His left atrium was mildly dilated and his right atrium was upper normal in size. Shortness of breath and edema persisted despite increasing diuretic regimen. He  subsequently underwent a CT scan of his chest and was noted to have a mild ascending aortic aneurysm. His ascending thoracic aorta on this most recent study measured 4.0 cm in maximum transverse diameter. He was noted to have a moderate-sized pericardial effusion which is clearly increased from 2007. There is no evidence for pulmonary emboli. He also had small probable cysts in the liver and is a small hyperenhancing liver mass.  He  underwent a right and left heart catheterization to evaluate for possible constrictive pericarditis versus restrictive physiology on 03/17/2013. There was no significant coronary obstructive disease with essentially normal coronary arteries only minimal luminal irregularity of the proximal LAD. There was normal to low normal global LV function without focal segmental wall motion abnormalities with ejection fraction of 50-55%. There is no evidence for constrictive pericarditis with a low RVEDP pressure, no evidence for early diastolic rapid Y descent following a moderate fluid challenge and there is no evidence for equalization of LVEDP and RVEDP. However, because of his moderate-sized pericardial effusion and symptoms he was referred for a pericardial window which was  done by Dr. Cyndia Bent successfully on 04/25/2013.   Subsequently, his  breathing pattern has markedly improved. He is now able to lie flat without dyspnea. He is able to have a full breath. He is not been using his CPAP.  [name] has had continued pain with reference to his hand injury which he sustained in [place]. He is anxiously awaiting elective surgery to to have this repaired. He presents for evaluation prior to getting operative consent.  When  I saw him for his followup evaluation on 05/09/2013 prior to giving consent for his hand surgery he was in atrial fibrillation of at least 4 days duration. At that time, I reinitiated anticoagulation with  pradaxa 150 mg twice a day and further titrated his beta  blocker therapyto Toprol 100 mg in the morning and 50 mg at night and continue him on his current dose of flecainide 100 mg twice a day. He had told me that remotely in the past he may have had intolerance to amiodarone and possibly Tikosyn. He presents now for 2 week followup evaluation  Past Medical History  Diagnosis Date  . Paroxysmal atrial fibrillation     s/p afib 2001 and 2009 by Dr Rolland Porter  . Coronary artery disease     no significant CAD by cath 5/12  . Cardiomyopathy     hx of tachycardia mediated CM, now resolved  . Hypertension   . Hyperlipidemia   . Thoracic aortic aneurysm   . Depression   . Sleep apnea     AHI avg 5/hr  . Limb pain 06/20/2007    LE venous doppler - no evidence of thrombus or thrombophlebitis  . PVC (premature ventricular contraction) 12/18/2009    cardionet monitor 21 days - some PVCs, ventricular bigeminy  . Pericardial effusion   . Echocardiogram abnormal     EF60%, mild LVH, PA pk pressure 35, pericardial effusion mild increase from 2012   . Shortness of breath   . Complication of anesthesia     Pt has anxiety issues and thinks he is going to die.  . S/P pericardial operation, 04/25/12, window 04/26/2013    Past Surgical History  Procedure Laterality Date  . Cardiac catheterization  08/25/10    NO SIGNIFICANT CAD  . Cardioversion  10/18/2007    successful  . Elbow / upper arm foreign body removal    . Incision and drainage of wound  09/07/2011    Procedure: IRRIGATION AND DEBRIDEMENT WOUND;  Surgeon: Kerin Salen, MD;  Location: [place];  Service: Orthopedics;  Laterality: Left;  . Transesophageal echocardiogram  08/26/2010    see Notes tab  . Cardiovascular stress test  09/08/2005    R/S MV - EF 61%; Exercises capacity 12 Mets; essentially normal perfusion myocardial scan    . Cardiac electrophysiology mapping and ablation  [id number]    Dr. Tally Due  . Osteosynthesis Left 09/30/2012    with small plate in the ulna  . Cardioversion N/A  02/08/2013    Procedure: CARDIOVERSION;  Surgeon: Troy Sine, MD;  Location: [facility] ENDOSCOPY;  Service: Cardiovascular;  Laterality: N/A;  . Fracture surgery Left     Left wrist  . Subxyphoid pericardial window N/A 04/25/2013    Procedure: SUBXYPHOID PERICARDIAL WINDOW;  Surgeon: Gaye Pollack, MD;  Location: [facility] OR;  Service: Thoracic;  Laterality: N/A;    Allergies  Allergen Reactions  . Tetracyclines & Related Hives    Current Outpatient Prescriptions  Medication Sig Dispense Refill  . atorvastatin (LIPITOR) 40 MG tablet Take 40 mg by mouth daily.      . Cholecalciferol (VITAMIN D) 2000 UNITS CAPS Take 2,000 Units by mouth daily.      . finasteride (PROPECIA) 1 MG tablet Take 1 mg by mouth every morning.       . flecainide (TAMBOCOR) 100 MG tablet Take 100 mg by mouth 2 (two) times daily.      . furosemide (LASIX) 40 MG tablet Take 40 mg  by mouth 2 (two) times daily. Take another 40 mg in the evening      . LORazepam (ATIVAN) 0.5 MG tablet Take 0.5 mg by mouth at bedtime.       . metoprolol succinate (TOPROL-XL) 100 MG 24 hr tablet Takes 100 mg twice daily  60 tablet  6  . oxyCODONE-acetaminophen (PERCOCET/ROXICET) 5-325 MG per tablet Take 1-2 tablets by mouth every 4 (four) hours as needed for severe pain.  30 tablet  0  . pantoprazole (PROTONIX) 40 MG tablet Take one tab twice daily for one week, then once daily for silent reflux  90 tablet  2  . potassium chloride SA (K-DUR,KLOR-CON) 20 MEQ tablet Take 1 tablet (20 mEq total) by mouth daily.  30 tablet  0  . PRADAXA 150 MG CAPS capsule TAKE ONE CAPSULE BY MOUTH TWICE A DAY  180 capsule  3  . Saw Palmetto, Serenoa repens, (SAW PALMETTO PO) Take 1 tablet by mouth 2 (two) times daily. 900 MG       No current facility-administered medications for this visit.    History   Social History  . Marital Status: Single    Spouse Name: N/A    Number of Children: 0  . Years of Education: N/A   Occupational History  .  [place] History Main Topics  . Smoking status: Former Smoker -- 1.00 packs/day for 10 years    Quit date: 02/06/1989  . Smokeless tobacco: Never Used  . Alcohol Use: Yes     Comment: 2 bottles of wine per week  . Drug Use: No  . Sexual Activity: Not on file   Other Topics Concern  . 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    Family History  Problem Relation Age of Onset  . Coronary artery disease Father   . Stroke Mother     died  . Atrial fibrillation Brother   . Heart attack Father   . Mitral valve prolapse Sister     ROS is negative for fevers, chills or night sweats. He denies rash. He denies visual changes. He denies change in hearing. He is unaware of lymphadenopathy. His shortness of breath with activity as well as lying flat has resolved following his pericardial window.  A previous cardiac catheterization had shown mild luminal irregularities of his coronary anatomy. He denies any prolonged episode of chest pressure. He has not noticed any improvement in his atrial fibrillation and ventricular rate has not significantly slowed further on the higher dose beta blocker therapy. He denies nausea vomiting or diarrhea. He is unaware of bleeding. He continues to have difficulty with his left hand weakness and is very frustrated on the delay that he may have relative to his hand surgery. He does note some mild ankle swelling intermittently. There is no diabetes. He denies cold or heat intolerance. Other 14 point comprehensive system review is negative.   PE BP: 110/80  P 100  WT 215   6'2" No obvious pulsus paradoxicus General: Alert, oriented, no distress.  Skin: normal turgor, no rashes HEENT: Normocephalic, atraumatic. Pupils round and reactive; sclera anicteric;no lid lag.  Nose  without nasal septal hypertrophy Mouth/Parynx benign; Mallinpatti scale 3 Neck: No JVD, no carotid briuts;  no hepatojugular reflux jugular reflux Lungs: clear to ausculatation and percussion; no wheezing or rales Heart: Irregularly irregular with ventricular rate approximately 120-130 compatible with recurrent atrial fibrillation, s1 s2 normal soft friction rub, 1/6 systolic murmur at the apex. Abdomen: soft, nontender; no hepatosplenomehaly, BS+; abdominal aorta nontender and not dilated by palpation. Pulses 2+ Extremities: no clubbing cyanosis or edema, Homan's sign negative  Neurologic: grossly nonfocal Psychologic: normal affect, very frustrated with his recurrent AF  ECG (independently read by me): Atrial  fibrillation at an average ventricular rate of 133 beats per minute her QTc interval 416 ms.  ECG of 05/09/2013: Atrial fibrillation at 100 beats per minute with nonspecific ST-T changes.   Prior ECG :Sinus rhythm at 90 beats per minute. Poor anterior R-wave progression V1 through V4 with QRS complex. Mild left axis deviation.  LABS:  BMET    Component Value Date/Time   NA 139 04/24/2013 1409   K 4.2 04/24/2013 1409   CL 100 04/24/2013 1409   CO2 22 04/24/2013 1409   GLUCOSE 112* 04/24/2013 1409   BUN 27* 04/24/2013 1409   CREATININE 1.05 04/24/2013 1409   CREATININE 1.07 03/14/2013 1617   CALCIUM 9.4 04/24/2013 1409   GFRNONAA 72* 04/24/2013 1409   GFRAA 83* 04/24/2013 1409     Hepatic Function Panel     Component Value Date/Time   PROT 7.0 04/24/2013 1409   ALBUMIN 4.1 04/24/2013 1409   AST 28 04/24/2013 1409   ALT 30 04/24/2013 1409   ALKPHOS 76 04/24/2013 1409   BILITOT 0.4 04/24/2013 1409     CBC    Component Value Date/Time   WBC 7.0 04/24/2013 1409   RBC 4.27 04/24/2013 1409   HGB 12.7* 04/24/2013 1409   HCT 37.5* 04/24/2013 1409   PLT 206 04/24/2013 1409   MCV 87.8 04/24/2013 1409   MCH 29.7 04/24/2013 1409   MCHC 33.9 04/24/2013 1409   RDW 13.7 04/24/2013 1409    LYMPHSABS 2.3 02/06/2013 1355   MONOABS 0.8 02/06/2013 1355   EOSABS 0.2 02/06/2013 1355   BASOSABS 0.0 02/06/2013 1355     BNP    Component Value Date/Time   PROBNP 58.97 03/14/2013 1615    Lipid Panel     Component Value Date/Time   CHOL  Value: 188        ATP III CLASSIFICATION:  <200     mg/dL   Desirable  200-239  mg/dL   Borderline High  >=240    mg/dL   High 08/23/2007 0910   TRIG 63 08/23/2007 0910   HDL 47 08/23/2007 0910   CHOLHDL 4.0 08/23/2007 0910   VLDL 13 08/23/2007 0910   LDLCALC  Value: 128        Total Cholesterol/HDL:CHD Risk Coronary Heart Disease Risk Table                     Men   Women  1/2 Average Risk   3.4   3.3* 08/23/2007 0910     RADIOLOGY: Dg Chest 2 View  03/10/2013   CLINICAL DATA:  Short of breath  EXAM: CHEST  2 VIEW  COMPARISON:  08/22/2010  FINDINGS: Mild cardiac enlargement. Negative for heart failure. Lungs are clear without infiltrate effusion or mass.  IMPRESSION: No active cardiopulmonary disease.   Electronically Signed   By: Franchot Gallo M.D.   On: 03/10/2013 15:53   Ct Angio Chest W/cm &/or Wo Cm  03/10/2013   CLINICAL DATA:  Shortness of breath.  Ascending thoracic aortic aneurysm. Pericardial effusion.  EXAM: CT ANGIOGRAPHY CHEST WITH CONTRAST  TECHNIQUE: Multidetector CT imaging of the chest was performed using the standard protocol during bolus administration of intravenous contrast. Multiplanar CT image reconstructions including MIPs were obtained to evaluate the vascular anatomy.  CONTRAST:  137mL OMNIPAQUE IOHEXOL 350 MG/ML SOLN  COMPARISON:  Chest radiographs obtained earlier today and chest CT dated 09/17/2005.  FINDINGS: The ascending thoracic aorta remains borderline enlarged, measuring 4.0 cm in maximum transverse diameter on image number 158 of series 5. Moderate sized pericardial effusion measuring 17.5 mm in thickness on image number 79 of series 4. Normally opacified pulmonary arteries. No pulmonary arterial filling defects are seen.  Clear lungs. No lung nodules or enlarged lymph nodes. 8 mm probable cyst in the superior aspect of the lateral segment of the left lobe of the liver on image number 85 and 11 mm probable cyst in the lateral segment of the left lobe of the liver on image number 93, both mildly increased in size since the previous examination. There is also a 5 mm rounded hyper enhancing mass in the anterior aspect of the lateral segment of the left lobe of the liver on image number 89, not definitely seen previously. Thoracic spine degenerative changes.  Review of the MIP images confirms the above findings.  IMPRESSION: 1. Moderate-sized pericardial effusion, increased in size. 2. Stable borderline aneurysmal dilatation of the ascending thoracic aorta. 3. No pulmonary emboli. 4. Small probable cysts in the liver and small hyper enhancing liver mass, as described above.   Electronically Signed   By: Enrique Sack M.D.   On: 03/10/2013 15:50      ASSESSMENT AND PLAN:  I again had  a long discussion with Mr. Sorby. He has a remote history of viral cardiomyopathy in the early 1990s at which time his EF was approximately 20% with subsequent improvement to the 50-55% range. He is status post prior atrial fibrillation ablations with his last being done in 2009. He developed chronic pericardial effusion dating back to 2007. He is now approximately 4 weeks since his subxiphoid pericardial window. This has resulted in significant improvement in his prior shortness of breath. He now can be supine. He is able to take a full breath. When I saw him 2 weeks ago he was back in atrial fibrillation of at least 4 days duration and at that time I felt it was important to reinstitute anticoagulation therapy and attempt further rate control. His rate today is still fast despite taking Toprol 100 mg in the morning and 50 mg at night. I will further titrate this to 100 mg twice a day. I also called Dr. Cathi Roan who is seeing the patient in the  past and arrange for Dr. Rayann Heman to see Mr. Hudecek hopefully later this week. Mr. Steidinger is very frustrated about the delay in obtaining his hand surgery. It may be possible that once we obtain rate control we will be able to allow him to have elective left hand surgery off anticoagulation for several days but then reinstitution of anticoagulation prior to attempting repeat cardioversion since he will need anticoagulation for a minimum of 4 weeks following cardioversion. I also discussed with him the potential need for subsequent ablation and the consideration for possible hybrid procedure since he has had 2 previous ablations thus far. I also discussed with him the potential benefit for ranolazine in atrial fibrillation is currently undergoing further investigation but this is not FDA approved for this  indication and I will not initiate this. I will defer further antiarrhythmic drug therapy to Dr. Rayann Heman.  Troy Sine, MD, [facility]  05/24/2013 7:08 PM

## 2013-05-24 NOTE — Patient Instructions (Addendum)
Your physician has recommended you make the following change in your medication:increase the metoprolol succ 100 mg to twice daily.  You have been referred to Dr. Thompson Grayer for evaluation of your a-fib. This appointment will be scheduled  If possible for this week.  Your physician recommends that you schedule a follow-up appointment with Dr. Claiborne Billings pending Dr. Jackalyn Lombard appointment and recommendations.

## 2013-05-26 ENCOUNTER — Ambulatory Visit (INDEPENDENT_AMBULATORY_CARE_PROVIDER_SITE_OTHER): Payer: Medicare Other | Admitting: Internal Medicine

## 2013-05-26 ENCOUNTER — Encounter: Payer: Self-pay | Admitting: Internal Medicine

## 2013-05-26 VITALS — BP 132/84 | HR 135 | Ht 74.0 in | Wt 210.4 lb

## 2013-05-26 DIAGNOSIS — Z7901 Long term (current) use of anticoagulants: Secondary | ICD-10-CM

## 2013-05-26 DIAGNOSIS — R0602 Shortness of breath: Secondary | ICD-10-CM

## 2013-05-26 DIAGNOSIS — I10 Essential (primary) hypertension: Secondary | ICD-10-CM

## 2013-05-26 DIAGNOSIS — I313 Pericardial effusion (noninflammatory): Secondary | ICD-10-CM

## 2013-05-26 DIAGNOSIS — I3139 Other pericardial effusion (noninflammatory): Secondary | ICD-10-CM

## 2013-05-26 DIAGNOSIS — G4730 Sleep apnea, unspecified: Secondary | ICD-10-CM

## 2013-05-26 DIAGNOSIS — I319 Disease of pericardium, unspecified: Secondary | ICD-10-CM

## 2013-05-26 DIAGNOSIS — I4891 Unspecified atrial fibrillation: Secondary | ICD-10-CM

## 2013-05-26 MED ORDER — DILTIAZEM HCL ER COATED BEADS 180 MG PO CP24: 180.0000 mg | ORAL_CAPSULE | Freq: Every day | ORAL | Status: DC

## 2013-05-26 NOTE — Patient Instructions (Addendum)
Your physician recommends that you schedule a follow-up appointment on Monday with Dr Rayann Heman at 10:15   Your physician has recommended you make the following change in your medication:  1) Start Cardizem 180mg  daily

## 2013-05-28 NOTE — Progress Notes (Signed)
PCP:  [name], MD Primary Cardiologist:  Dr Claiborne Billings  The patient presents today for urgent add on electrophysiology followup after recent worsening of his atrial fibrillation.  He recently underwent pericardial window procedure for symptomatic chronic effusion.  With resolution of his effusion, his SOB/ orthopnea resolved.  He has however developed recurrent afib.  V rates have been very difficult to control.  Interestingly, he is less symptomatic with his afib at this time.  He does notice palpitations but has less SOB.  He also has fatigue.  He continues to struggle with his L wrist/ hand and now needs carpal tunnel surgery.  Surgery cannot be scheduled until his ventricular rates are better controlled. Today, he denies symptoms of chest pain, dizziness, presyncope, syncope, or neurologic sequela.  The patient feels that he is tolerating medications without difficulties and is otherwise without complaint today.   Past Medical History  Diagnosis Date  . Paroxysmal atrial fibrillation     s/p afib 2001 and 2009 by Dr Rolland Porter  . Coronary artery disease     no significant CAD by cath 5/12  . Cardiomyopathy     hx of tachycardia mediated CM, now resolved  . Hypertension   . Hyperlipidemia   . Thoracic aortic aneurysm   . Depression   . Sleep apnea     AHI avg 5/hr  . Limb pain 06/20/2007    LE venous doppler - no evidence of thrombus or thrombophlebitis  . PVC (premature ventricular contraction) 12/18/2009    cardionet monitor 21 days - some PVCs, ventricular bigeminy  . Pericardial effusion   . Echocardiogram abnormal     EF60%, mild LVH, PA pk pressure 35, pericardial effusion mild increase from 2012   . Shortness of breath   . Complication of anesthesia     Pt has anxiety issues and thinks he is going to die.  . S/P pericardial operation, 04/25/12, window 04/26/2013   Past Surgical History  Procedure Laterality Date  . Cardiac catheterization  08/25/10    NO SIGNIFICANT CAD  .  Cardioversion  10/18/2007    successful  . Elbow / upper arm foreign body removal    . Incision and drainage of wound  09/07/2011    Procedure: IRRIGATION AND DEBRIDEMENT WOUND;  Surgeon: Kerin Salen, MD;  Location: [place];  Service: Orthopedics;  Laterality: Left;  . Transesophageal echocardiogram  08/26/2010    see Notes tab  . Cardiovascular stress test  09/08/2005    R/S MV - EF 61%; Exercises capacity 12 Mets; essentially normal perfusion myocardial scan    . Cardiac electrophysiology mapping and ablation  [id number]    Dr. Tally Due  . Osteosynthesis Left 09/30/2012    with small plate in the ulna  . Cardioversion N/A 02/08/2013    Procedure: CARDIOVERSION;  Surgeon: Troy Sine, MD;  Location[facility] ENDOSCOPY;  Service: Cardiovascular;  Laterality: N/A;  . Fracture surgery Left     Left wrist  . Subxyphoid pericardial window N/A 04/25/2013    Procedure: SUBXYPHOID PERICARDIAL WINDOW;  Surgeon: Gaye Pollack, MD;  Location: [place] [facility];  Service: Thoracic;  Laterality: N/A;    Current Outpatient Prescriptions  Medication Sig Dispense Refill  . atorvastatin (LIPITOR) 40 MG tablet Take 40 mg by mouth daily.      . Cholecalciferol (VITAMIN D) 2000 UNITS CAPS Take 2,000 Units by mouth daily.      . finasteride (PROPECIA) 1 MG tablet Take 1 mg by mouth every morning.       Marland Kitchen  flecainide (TAMBOCOR) 100 MG tablet Take 100 mg by mouth 2 (two) times daily.      . furosemide (LASIX) 40 MG tablet Take 40 mg by mouth 2 (two) times daily.       Marland Kitchen LORazepam (ATIVAN) 0.5 MG tablet Take 0.5 mg by mouth at bedtime.       . metoprolol succinate (TOPROL-XL) 100 MG 24 hr tablet Takes 100 mg twice daily  60 tablet  6  . oxyCODONE-acetaminophen (PERCOCET/ROXICET) 5-325 MG per tablet Take 1-2 tablets by mouth every 4 (four) hours as needed for severe pain.  30 tablet  0  . pantoprazole (PROTONIX) 40 MG tablet Take 40 mg by mouth daily.      . potassium chloride SA (K-DUR,KLOR-CON) 20 MEQ tablet Take 1 tablet  (20 mEq total) by mouth daily.  30 tablet  0  . PRADAXA 150 MG CAPS capsule TAKE ONE CAPSULE BY MOUTH TWICE A DAY  180 capsule  3  . Saw Palmetto, Serenoa repens, (SAW PALMETTO PO) Take 900 MG twice a day      . diltiazem (CARDIZEM CD) 180 MG 24 hr capsule Take 1 capsule (180 mg total) by mouth daily.  90 capsule  3   No current facility-administered medications for this visit.    Allergies  Allergen Reactions  . Tetracyclines & Related Hives    History   Social History  . Marital Status: Single    Spouse Name: N/A    Number of Children: 0  . Years of Education: N/A   Occupational History  .  [place] History Main Topics  . Smoking status: Former Smoker -- 1.00 packs/day for 10 years    Quit date: 02/06/1989  . Smokeless tobacco: Never Used  . Alcohol Use: Yes     Comment: 2 bottles of wine per week  . Drug Use: No  . Sexual Activity: Not on file   Other Topics Concern  . 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    Family History  Problem Relation Age of Onset  . Coronary artery disease Father   . Stroke Mother     died  . Atrial fibrillation Brother   . Heart attack Father   . Mitral valve prolapse Sister     ROS-  All systems are reviewed and are negative except as outlined in the HPI above  Physical Exam: Filed Vitals:   05/26/13 1455  BP: 132/84  Pulse: 135  Height: 6\' 2"  (1.88 m)  Weight: 210 lb 6.4 oz (95.437 kg)    GEN- The patient is well appearing, alert and oriented x 3 today.   Head- normocephalic, atraumatic Eyes-  Sclera clear, conjunctiva pink Ears- hearing intact Oropharynx- clear Neck- supple, no JVD Lungs- clear, normal wob Heart- tachycardic irregular rhythm GI- soft, NT, ND, + BS Extremities- no clubbing, cyanosis, no  edema MS- L wrist scar is noted Skin- no rash or lesion Psych- euthymic mood, full affect Neuro- strength and sensation are intact  ekg today reveals afib, V rates 135 bpm, septal infarct pattern  Assessment and Plan:  1. Persistent afib He is back in afib and V rates are elevated Afib may have been insighted by his pericardial window procedure His CHADS2VASC score is  2.  He is appropriately anticoagulated with pradaxa. I think that the most prudent course is to rate control and proceed with carpal tunnel surgery.  After surgery, we can resume anticoagulation and then cardiovert.  If we try to cardiovert now, then he could not have his hand surgery for a month.  He could easily return to afib with the stress of that proceed... Long term, our options are tikosyn, sotalol, or ablation.  I would favor a hybrid approach to ablation as he has had two prior ablations already by Dr Rolland Porter  2. Shortness of breath/ edema Much improved with pericardial window procedure  3. OSA Compliance with CPAP is encouraged  4. HTN Stable  Follow-up with me on Monday to assess response to diltiazem I will increase diltiazem at that time if V rates are >100 bpm

## 2013-05-29 ENCOUNTER — Encounter: Payer: Self-pay | Admitting: Internal Medicine

## 2013-05-29 ENCOUNTER — Telehealth: Payer: Self-pay | Admitting: Internal Medicine

## 2013-05-29 ENCOUNTER — Ambulatory Visit (INDEPENDENT_AMBULATORY_CARE_PROVIDER_SITE_OTHER): Payer: Medicare Other | Admitting: Internal Medicine

## 2013-05-29 ENCOUNTER — Telehealth: Payer: Self-pay | Admitting: Cardiovascular Disease

## 2013-05-29 VITALS — BP 104/62 | HR 85 | Ht 74.0 in | Wt 216.0 lb

## 2013-05-29 DIAGNOSIS — M6281 Muscle weakness (generalized): Secondary | ICD-10-CM

## 2013-05-29 DIAGNOSIS — R29898 Other symptoms and signs involving the musculoskeletal system: Secondary | ICD-10-CM

## 2013-05-29 DIAGNOSIS — R0602 Shortness of breath: Secondary | ICD-10-CM

## 2013-05-29 DIAGNOSIS — Z7901 Long term (current) use of anticoagulants: Secondary | ICD-10-CM

## 2013-05-29 DIAGNOSIS — I319 Disease of pericardium, unspecified: Secondary | ICD-10-CM

## 2013-05-29 DIAGNOSIS — I313 Pericardial effusion (noninflammatory): Secondary | ICD-10-CM

## 2013-05-29 DIAGNOSIS — I4891 Unspecified atrial fibrillation: Secondary | ICD-10-CM

## 2013-05-29 DIAGNOSIS — I3139 Other pericardial effusion (noninflammatory): Secondary | ICD-10-CM

## 2013-05-29 MED ORDER — DILTIAZEM HCL ER COATED BEADS 240 MG PO CP24: 240.0000 mg | ORAL_CAPSULE | Freq: Every day | ORAL | Status: DC

## 2013-05-29 NOTE — Telephone Encounter (Signed)
Is wanting something in writing saying that this patient has cardiac clearance.. Please fax to [phone]

## 2013-05-29 NOTE — Telephone Encounter (Signed)
Office note faxed.

## 2013-05-29 NOTE — Progress Notes (Signed)
PCP:  [name], MD Primary Cardiologist:  Dr Claiborne Billings  The patient presents today for followup.  He was last seen by me on Friday (see my note) with rapidly conducting afib.  I added cardizem CD 180mg  daily and his rates are improved.  His SOB is better though he is tired in the afternoon. Today, he denies symptoms of chest pain, dizziness, presyncope, syncope, or neurologic sequela.  The patient feels that he is tolerating medications without difficulties and is otherwise without complaint today.   Past Medical History  Diagnosis Date  . Paroxysmal atrial fibrillation     s/p afib 2001 and 2009 by Dr Rolland Porter  . Coronary artery disease     no significant CAD by cath 5/12  . Cardiomyopathy     hx of tachycardia mediated CM, now resolved  . Hypertension   . Hyperlipidemia   . Thoracic aortic aneurysm   . Depression   . Sleep apnea     AHI avg 5/hr  . Limb pain 06/20/2007    LE venous doppler - no evidence of thrombus or thrombophlebitis  . PVC (premature ventricular contraction) 12/18/2009    cardionet monitor 21 days - some PVCs, ventricular bigeminy  . Pericardial effusion   . Echocardiogram abnormal     EF60%, mild LVH, PA pk pressure 35, pericardial effusion mild increase from 2012   . Shortness of breath   . Complication of anesthesia     Pt has anxiety issues and thinks he is going to die.  . S/P pericardial operation, 04/25/12, window 04/26/2013   Past Surgical History  Procedure Laterality Date  . Cardiac catheterization  08/25/10    NO SIGNIFICANT CAD  . Cardioversion  10/18/2007    successful  . Elbow / upper arm foreign body removal    . Incision and drainage of wound  09/07/2011    Procedure: IRRIGATION AND DEBRIDEMENT WOUND;  Surgeon: Kerin Salen, MD;  Location: [place];  Service: Orthopedics;  Laterality: Left;  . Transesophageal echocardiogram  08/26/2010    see Notes tab  . Cardiovascular stress test  09/08/2005    R/S MV - EF 61%; Exercises capacity 12 Mets;  essentially normal perfusion myocardial scan    . Cardiac electrophysiology mapping and ablation  [id number]    Dr. Tally Due  . Osteosynthesis Left 09/30/2012    with small plate in the ulna  . Cardioversion N/A 02/08/2013    Procedure: CARDIOVERSION;  Surgeon: Troy Sine, MD;  Location: [facility] ENDOSCOPY;  Service: Cardiovascular;  Laterality: N/A;  . Fracture surgery Left     Left wrist  . Subxyphoid pericardial window N/A 04/25/2013    Procedure: SUBXYPHOID PERICARDIAL WINDOW;  Surgeon: Gaye Pollack, MD;  Location: [place] [facility];  Service: Thoracic;  Laterality: N/A;    Current Outpatient Prescriptions  Medication Sig Dispense Refill  . atorvastatin (LIPITOR) 40 MG tablet Take 40 mg by mouth daily.      . Cholecalciferol (VITAMIN D) 2000 UNITS CAPS Take 2,000 Units by mouth daily.      Marland Kitchen diltiazem (CARDIZEM CD) 240 MG 24 hr capsule Take 1 capsule (240 mg total) by mouth daily.  90 capsule  3  . finasteride (PROPECIA) 1 MG tablet Take 1 mg by mouth every morning.       . flecainide (TAMBOCOR) 100 MG tablet Take 100 mg by mouth 2 (two) times daily.      . furosemide (LASIX) 40 MG tablet Take 40 mg by mouth 2 (two) times  daily.       Marland Kitchen LORazepam (ATIVAN) 0.5 MG tablet Take 0.5 mg by mouth at bedtime.       . metoprolol succinate (TOPROL-XL) 100 MG 24 hr tablet Takes 100 mg twice daily  60 tablet  6  . oxyCODONE-acetaminophen (PERCOCET/ROXICET) 5-325 MG per tablet Take 1-2 tablets by mouth every 4 (four) hours as needed for severe pain.  30 tablet  0  . pantoprazole (PROTONIX) 40 MG tablet Take 40 mg by mouth daily.      . potassium chloride SA (K-DUR,KLOR-CON) 20 MEQ tablet Take 1 tablet (20 mEq total) by mouth daily.  30 tablet  0  . PRADAXA 150 MG CAPS capsule TAKE ONE CAPSULE BY MOUTH TWICE A DAY  180 capsule  3  . Saw Palmetto, Serenoa repens, (SAW PALMETTO PO) Take 900 MG twice a day       No current facility-administered medications for this visit.    Allergies  Allergen Reactions   . Tetracyclines & Related Hives    History   Social History  . Marital Status: Single    Spouse Name: N/A    Number of Children: 0  . Years of Education: N/A   Occupational History  .  [employer] History Main Topics  . Smoking status: Former Smoker -- 1.00 packs/day for 10 years    Quit date: 02/06/1989  . Smokeless tobacco: Never Used  . Alcohol Use: Yes     Comment: 2 bottles of wine per week  . Drug Use: No  . Sexual Activity: Not on file   Other Topics Concern  . 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    Family History  Problem Relation Age of Onset  . Coronary artery disease Father   . Stroke Mother     died  . Atrial fibrillation Brother   . Heart attack Father   . Mitral valve prolapse Sister     Physical Exam: Filed Vitals:   05/29/13 0920  BP: 104/62  Pulse: 85  Height: 6\' 2"  (1.88 m)  Weight: 216 lb (97.977 kg)    GEN- The patient is well appearing, alert and oriented x 3 today.   Head- normocephalic, atraumatic Eyes-  Sclera clear, conjunctiva pink Ears- hearing intact Oropharynx- clear Neck- supple, no JVD Lungs- clear, normal wob Heart- iRRR GI- soft, NT, ND, + BS Extremities- no clubbing, cyanosis, no edema MS- L wrist scar is noted Skin- no rash or lesion Psych- euthymic mood, full affect Neuro- strength and sensation are intact  ekg today reveals afib, V rates 85 bpm, septal infarct pattern  Assessment and Plan:  1. Persistent afib Improved with diltiazem I will increase diltiazem to 240mg  daily today Check flecainide level  2. Shortness of breath/ edema Much improved with pericardial window procedure Repeat echo at this time post window  3. Preop Given better control of V rates, proceed with hand surgery if  medically indicated Echo is ordered  I would like to see him after his surgery has been performed at the time that he can be placed back on anticoagulation to determine the treatment plan going forward at that point (likely 4-6 weeks from now).

## 2013-05-29 NOTE — Telephone Encounter (Signed)
Returned call to [facility] and left message that pt was not cleared for surgery.  Advised she contact Dr. Jackalyn Lombard office.  Call back today before 4pm if questions.

## 2013-05-29 NOTE — Patient Instructions (Signed)
Your physician recommends that you schedule a follow-up appointment Fri for an EKG in nurse room and 4-6 weeks with Dr Rayann Heman   Your physician has requested that you have an echocardiogram. Echocardiography is a painless test that uses sound waves to create images of your heart. It provides your doctor with information about the size and shape of your heart and how well your heart's chambers and valves are working. This procedure takes approximately one hour. There are no restrictions for this procedure.---Dr Rayann Heman would like it to be Fri   Your physician recommends that you return for lab work today  Your physician has recommended you make the following change in your medication:  1) Increase Cardizem to 240mg  daily

## 2013-05-29 NOTE — Telephone Encounter (Signed)
New message    Need something in written stating patient is clear for surgery     Fax [phone]

## 2013-05-30 ENCOUNTER — Other Ambulatory Visit: Payer: Self-pay | Admitting: *Deleted

## 2013-05-30 DIAGNOSIS — I4891 Unspecified atrial fibrillation: Secondary | ICD-10-CM

## 2013-05-30 DIAGNOSIS — I319 Disease of pericardium, unspecified: Secondary | ICD-10-CM

## 2013-06-02 ENCOUNTER — Telehealth: Payer: Self-pay | Admitting: Internal Medicine

## 2013-06-02 ENCOUNTER — Ambulatory Visit (HOSPITAL_COMMUNITY)
Admission: RE | Admit: 2013-06-02 | Discharge: 2013-06-02 | Disposition: A | Discharge status: Home / Self Care | Payer: Medicare Other | Source: Ambulatory Visit | Attending: Internal Medicine | Admitting: Internal Medicine

## 2013-06-02 DIAGNOSIS — I77819 Aortic ectasia, unspecified site: Secondary | ICD-10-CM | POA: Insufficient documentation

## 2013-06-02 DIAGNOSIS — I517 Cardiomegaly: Secondary | ICD-10-CM | POA: Insufficient documentation

## 2013-06-02 DIAGNOSIS — I4891 Unspecified atrial fibrillation: Secondary | ICD-10-CM

## 2013-06-02 DIAGNOSIS — I319 Disease of pericardium, unspecified: Secondary | ICD-10-CM

## 2013-06-02 NOTE — Telephone Encounter (Signed)
Spoke with patient and let him know his clearance was faxed and i gave him his echo results.  Still waiting of Flecainide level

## 2013-06-02 NOTE — Telephone Encounter (Signed)
Follow up     Pt want tikosyn level results and echo results.  Pt is not very happy because he needs to have surgery soon. He said Dr Leda Min office does not have the clearance.

## 2013-06-02 NOTE — Progress Notes (Signed)
  Echocardiogram 2D Echocardiogram has been performed.  Mauricio Po 06/02/2013, 1:11 PM

## 2013-06-02 NOTE — Telephone Encounter (Addendum)
New message    Waiting to get clearance for surgery.  pls fax to [phone].

## 2013-06-02 NOTE — Telephone Encounter (Signed)
Left fa message for the patient and [name] [name] at Dr Angus Palms office that the note was faxed on 05/29/13 with clearance

## 2013-06-03 LAB — FLECAINIDE LEVEL: Flecainide: 0.28 ug/mL (ref 0.20–1.00)

## 2013-06-05 NOTE — Telephone Encounter (Signed)
Left patient a message this morning with normal Flecainide level

## 2013-06-06 MED ORDER — FLECAINIDE ACETATE 150 MG PO TABS: 150.0000 mg | ORAL_TABLET | Freq: Two times a day (BID) | ORAL | Status: DC

## 2013-06-06 NOTE — Telephone Encounter (Signed)
Message copied by Dionicio Stall on Tue Jun 06, 2013  8:07 AM ------      Message from: Thompson Grayer      Created: Mon Jun 05, 2013  9:35 PM       Results reviewed.  Please inform pt of result.  Increase flecainide to 150mg  BID. ------

## 2013-06-06 NOTE — Telephone Encounter (Signed)
Spoke with patient and will increase Flecainide to 150mg  bid

## 2013-06-26 ENCOUNTER — Ambulatory Visit (INDEPENDENT_AMBULATORY_CARE_PROVIDER_SITE_OTHER): Payer: Medicare Other | Admitting: *Deleted

## 2013-06-26 VITALS — BP 112/70 | HR 58 | Wt 217.8 lb

## 2013-06-26 DIAGNOSIS — I4891 Unspecified atrial fibrillation: Secondary | ICD-10-CM

## 2013-06-26 NOTE — Progress Notes (Signed)
In for nurse visit; EKG due to increase of his Flecainide.  Pt alert and oriented.  Skin warm and dry to touch. Reviewed medications.  States he is taking Lasix 40 mg BID; KCL 20 meq 1 1/2 tablets daily; has increased Flecainide to 150 mg BID;and Cardizem 240 mg daily. Pt does have a slight hacky cough that he states everyone in his office has the cough.  Thinks related to allergy or mold.  EKG performed.  Shows AF w/slow ventricular response.  Reviewed by Dr. Rayann Heman who came in to speak with pt.  Pt is scheduled to have shoulder surgery this Wed and wanted to know if he could continue as scheduled.  Dr. Rayann Heman approved his continuing with the surgery and wants to see him back in 4-5 weeks post surgery.  Gave pt copy of his EKG and made him an appointment to see Dr. Rayann Heman on 4/30 at 9:45 depending on his recovery from surgery. Pt will continue his dose of Lasix and KCL and when seen back in office Dr. Rayann Heman may adjust according to BMET pre surgery.

## 2013-06-27 ENCOUNTER — Encounter: Payer: Self-pay | Admitting: Internal Medicine

## 2013-07-05 HISTORY — PX: CARPAL TUNNEL RELEASE: SHX101

## 2013-08-03 ENCOUNTER — Encounter: Payer: Self-pay | Admitting: *Deleted

## 2013-08-03 ENCOUNTER — Ambulatory Visit (INDEPENDENT_AMBULATORY_CARE_PROVIDER_SITE_OTHER): Payer: Medicare Other | Admitting: Internal Medicine

## 2013-08-03 ENCOUNTER — Encounter: Payer: Self-pay | Admitting: Internal Medicine

## 2013-08-03 VITALS — BP 140/90 | HR 71 | Ht 74.0 in | Wt 222.0 lb

## 2013-08-03 DIAGNOSIS — G4730 Sleep apnea, unspecified: Secondary | ICD-10-CM

## 2013-08-03 DIAGNOSIS — I10 Essential (primary) hypertension: Secondary | ICD-10-CM

## 2013-08-03 DIAGNOSIS — I428 Other cardiomyopathies: Secondary | ICD-10-CM

## 2013-08-03 DIAGNOSIS — I313 Pericardial effusion (noninflammatory): Secondary | ICD-10-CM

## 2013-08-03 DIAGNOSIS — I3139 Other pericardial effusion (noninflammatory): Secondary | ICD-10-CM

## 2013-08-03 DIAGNOSIS — R0602 Shortness of breath: Secondary | ICD-10-CM

## 2013-08-03 DIAGNOSIS — I4891 Unspecified atrial fibrillation: Secondary | ICD-10-CM

## 2013-08-03 DIAGNOSIS — I319 Disease of pericardium, unspecified: Secondary | ICD-10-CM

## 2013-08-03 DIAGNOSIS — I429 Cardiomyopathy, unspecified: Secondary | ICD-10-CM

## 2013-08-03 LAB — CBC WITH DIFFERENTIAL/PLATELET
BASOS ABS: 0 10*3/uL (ref 0.0–0.1)
Basophils Relative: 0.4 % (ref 0.0–3.0)
Eosinophils Absolute: 0.2 10*3/uL (ref 0.0–0.7)
Eosinophils Relative: 2.2 % (ref 0.0–5.0)
HEMATOCRIT: 38.4 % — AB (ref 39.0–52.0)
Hemoglobin: 12.6 g/dL — ABNORMAL LOW (ref 13.0–17.0)
LYMPHS ABS: 2.2 10*3/uL (ref 0.7–4.0)
Lymphocytes Relative: 23 % (ref 12.0–46.0)
MCHC: 32.7 g/dL (ref 30.0–36.0)
MCV: 88.1 fl (ref 78.0–100.0)
Monocytes Absolute: 0.7 10*3/uL (ref 0.1–1.0)
Monocytes Relative: 7.2 % (ref 3.0–12.0)
Neutro Abs: 6.5 10*3/uL (ref 1.4–7.7)
Neutrophils Relative %: 67.2 % (ref 43.0–77.0)
Platelets: 313 10*3/uL (ref 150.0–400.0)
RBC: 4.36 Mil/uL (ref 4.22–5.81)
RDW: 13.4 % (ref 11.5–14.6)
WBC: 9.7 10*3/uL (ref 4.5–10.5)

## 2013-08-03 LAB — BASIC METABOLIC PANEL
BUN: 14 mg/dL (ref 6–23)
CHLORIDE: 107 meq/L (ref 96–112)
CO2: 29 mEq/L (ref 19–32)
Calcium: 9.2 mg/dL (ref 8.4–10.5)
Creatinine, Ser: 1 mg/dL (ref 0.4–1.5)
GFR: 78.33 mL/min (ref >60.00)
GLUCOSE: 83 mg/dL (ref 70–99)
POTASSIUM: 4.4 meq/L (ref 3.5–5.1)
Sodium: 141 mEq/L (ref 135–145)

## 2013-08-03 NOTE — Patient Instructions (Addendum)
Your physician recommends that you schedule a follow-up appointment in: 5 weeks with Dr Rayann Heman  Your physician has recommended that you have a Cardioversion (DCCV). Electrical Cardioversion uses a jolt of electricity to your heart either through paddles or wired patches attached to your chest. This is a controlled, usually prescheduled, procedure. Defibrillation is done under light anesthesia in the hospital, and you usually go home the day of the procedure. This is done to get your heart back into a normal rhythm. You are not awake for the procedure. Please see the instruction sheet given to you today.   See instruction sheet for Cardioversion

## 2013-08-03 NOTE — Progress Notes (Signed)
PCP:  [name], MD Primary Cardiologist:  Dr Claiborne Billings  The patient presents today for electrophysiology followup.  His afib is stable but persistent.  He recently underwent hand surgery which was uneventful.  He has fatigue and palpitations with afib. Today, he denies symptoms of chest pain, dizziness, presyncope, syncope, or neurologic sequela.  The patient feels that he is tolerating medications without difficulties and is otherwise without complaint today.   Past Medical History  Diagnosis Date  . Paroxysmal atrial fibrillation     s/p afib 2001 and 2009 by Dr Rolland Porter  . Coronary artery disease     no significant CAD by cath 5/12  . Cardiomyopathy     hx of tachycardia mediated CM, now resolved  . Hypertension   . Hyperlipidemia   . Thoracic aortic aneurysm   . Depression   . Sleep apnea     AHI avg 5/hr  . Limb pain 06/20/2007    LE venous doppler - no evidence of thrombus or thrombophlebitis  . PVC (premature ventricular contraction) 12/18/2009    cardionet monitor 21 days - some PVCs, ventricular bigeminy  . Pericardial effusion   . Echocardiogram abnormal     EF60%, mild LVH, PA pk pressure 35, pericardial effusion mild increase from 2012   . Shortness of breath   . Complication of anesthesia     Pt has anxiety issues and thinks he is going to die.  . S/P pericardial operation, 04/25/12, window 04/26/2013   Past Surgical History  Procedure Laterality Date  . Cardiac catheterization  08/25/10    NO SIGNIFICANT CAD  . Cardioversion  10/18/2007    successful  . Elbow / upper arm foreign body removal    . Incision and drainage of wound  09/07/2011    Procedure: IRRIGATION AND DEBRIDEMENT WOUND;  Surgeon: Kerin Salen, MD;  Location: [place];  Service: Orthopedics;  Laterality: Left;  . Transesophageal echocardiogram  08/26/2010    see Notes tab  . Cardiovascular stress test  09/08/2005    R/S MV - EF 61%; Exercises capacity 12 Mets; essentially normal perfusion myocardial scan     . Cardiac electrophysiology mapping and ablation  [id number]    Dr. Tally Due  . Osteosynthesis Left 09/30/2012    with small plate in the ulna  . Cardioversion N/A 02/08/2013    Procedure: CARDIOVERSION;  Surgeon: Troy Sine, MD;  Location: [facility] ENDOSCOPY;  Service: Cardiovascular;  Laterality: N/A;  . Fracture surgery Left     Left wrist  . Subxyphoid pericardial window N/A 04/25/2013    Procedure: SUBXYPHOID PERICARDIAL WINDOW;  Surgeon: Gaye Pollack, MD;  Location: [facility];  Service: Thoracic;  Laterality: N/A;    Current Outpatient Prescriptions  Medication Sig Dispense Refill  . atorvastatin (LIPITOR) 40 MG tablet Take 40 mg by mouth daily.      . Cholecalciferol (VITAMIN D) 2000 UNITS CAPS Take 2,000 Units by mouth daily.      Marland Kitchen diltiazem (CARDIZEM CD) 240 MG 24 hr capsule Take 1 capsule (240 mg total) by mouth daily.  90 capsule  3  . finasteride (PROPECIA) 1 MG tablet Take 1 mg by mouth every morning.       . flecainide (TAMBOCOR) 150 MG tablet Take 1 tablet (150 mg total) by mouth 2 (two) times daily.  60 tablet  6  . furosemide (LASIX) 40 MG tablet Take 40 mg by mouth 2 (two) times daily.       Marland Kitchen LORazepam (ATIVAN) 0.5  MG tablet Take 0.5 mg by mouth at bedtime.       . metoprolol succinate (TOPROL-XL) 100 MG 24 hr tablet Takes 100 mg twice daily  60 tablet  6  . OVER THE COUNTER MEDICATION Taking Tussionex-DM for coughing as needed      . oxyCODONE-acetaminophen (PERCOCET/ROXICET) 5-325 MG per tablet Take 1-2 tablets by mouth every 4 (four) hours as needed for severe pain.  30 tablet  0  . pantoprazole (PROTONIX) 40 MG tablet Take 40 mg by mouth daily.      . potassium chloride SA (K-DUR,KLOR-CON) 20 MEQ tablet Take 1 1/2 tablets daily      . PRADAXA 150 MG CAPS capsule TAKE ONE CAPSULE BY MOUTH TWICE A DAY  180 capsule  3  . Saw Palmetto, Serenoa repens, (SAW PALMETTO PO) Take 900 MG twice a day       No current facility-administered medications for this visit.     Allergies  Allergen Reactions  . Tetracyclines & Related Hives    History   Social History  . Marital Status: Single    Spouse Name: N/A    Number of Children: 0  . Years of Education: N/A   Occupational History  .  [employer] [employer] History Main Topics  . Smoking status: Former Smoker -- 1.00 packs/day for 10 years    Quit date: 02/06/1989  . Smokeless tobacco: Never Used  . Alcohol Use: Yes     Comment: 2 bottles of wine per week  . Drug Use: No  . Sexual Activity: Not on file   Other Topics Concern  . 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    Family History  Problem Relation Age of Onset  . Coronary artery disease Father   . Stroke Mother     died  . Atrial fibrillation Brother   . Heart attack Father   . Mitral valve prolapse Sister     ROS-  All systems are reviewed and are negative except as outlined in the HPI above  Physical Exam: Filed Vitals:   08/03/13 1000  BP: 140/90  Pulse: 71  Height: 6\' 2"  (1.88 m)  Weight: 222 lb (100.699 kg)    GEN- The patient is well appearing, alert and oriented x 3 today.   Head- normocephalic, atraumatic Eyes-  Sclera clear, conjunctiva pink Ears- hearing intact Oropharynx- clear Neck- supple, no JVD Lungs- clear, normal wob Heart- iRRR, no murmurs GI- soft, NT, ND, + BS Extremities- no clubbing, cyanosis,+1 edema MS- L wrist scar is noted Skin- no rash or lesion Psych- euthymic mood, full affect Neuro- strength and sensation are intact  ekg today reveals afib, V rates 71 bpm, septal infarct pattern Echo is reviewed Epic notes are reviewed  Assessment and Plan:  1. Persistent afib He has been back on anticoagulation with pradaxa 150mg  BID for at least 4 weeks.  Risks, benefits, and alternatives  to cardioversion were discussed with him today.  He will continue flecainide.  I have instructed him to hold toprol the night before cardioversion and cardizem the morning of cardioversion.  If he does not maintain sinus long term, our options are tikosyn, sotalol, or ablation.  I would favor a hybrid approach to ablation as he  has had two prior ablations already by Dr Rolland Porter  2. Shortness of breath/ edema Much improved with pericardial window procedure  3. OSA Compliance with CPAP is encouraged  4. HTN Stable  Return to see me 4 weeks after cardioversion

## 2013-08-15 ENCOUNTER — Encounter (HOSPITAL_COMMUNITY): Payer: Self-pay | Admitting: Pharmacy Technician

## 2013-08-17 ENCOUNTER — Encounter (HOSPITAL_COMMUNITY)
Admission: RE | Disposition: A | Discharge status: Home / Self Care | Payer: Self-pay | Source: Ambulatory Visit | Attending: Cardiovascular Disease

## 2013-08-17 ENCOUNTER — Ambulatory Visit (HOSPITAL_COMMUNITY)
Admission: RE | Admit: 2013-08-17 | Discharge: 2013-08-17 | Disposition: A | Discharge status: Home / Self Care | Payer: Medicare Other | Source: Ambulatory Visit | Attending: Cardiovascular Disease | Admitting: Cardiovascular Disease

## 2013-08-17 ENCOUNTER — Encounter (HOSPITAL_COMMUNITY): Payer: Medicare Other | Admitting: Anesthesiology

## 2013-08-17 ENCOUNTER — Encounter (HOSPITAL_COMMUNITY): Payer: Self-pay | Admitting: Anesthesiology

## 2013-08-17 ENCOUNTER — Ambulatory Visit (HOSPITAL_COMMUNITY): Payer: Medicare Other | Admitting: Anesthesiology

## 2013-08-17 DIAGNOSIS — F3289 Other specified depressive episodes: Secondary | ICD-10-CM | POA: Insufficient documentation

## 2013-08-17 DIAGNOSIS — Z87891 Personal history of nicotine dependence: Secondary | ICD-10-CM | POA: Insufficient documentation

## 2013-08-17 DIAGNOSIS — Z7901 Long term (current) use of anticoagulants: Secondary | ICD-10-CM | POA: Insufficient documentation

## 2013-08-17 DIAGNOSIS — I4891 Unspecified atrial fibrillation: Secondary | ICD-10-CM | POA: Insufficient documentation

## 2013-08-17 DIAGNOSIS — F329 Major depressive disorder, single episode, unspecified: Secondary | ICD-10-CM | POA: Insufficient documentation

## 2013-08-17 DIAGNOSIS — G4733 Obstructive sleep apnea (adult) (pediatric): Secondary | ICD-10-CM | POA: Insufficient documentation

## 2013-08-17 DIAGNOSIS — Z79899 Other long term (current) drug therapy: Secondary | ICD-10-CM | POA: Insufficient documentation

## 2013-08-17 DIAGNOSIS — I509 Heart failure, unspecified: Secondary | ICD-10-CM | POA: Insufficient documentation

## 2013-08-17 DIAGNOSIS — I10 Essential (primary) hypertension: Secondary | ICD-10-CM | POA: Insufficient documentation

## 2013-08-17 DIAGNOSIS — I739 Peripheral vascular disease, unspecified: Secondary | ICD-10-CM | POA: Insufficient documentation

## 2013-08-17 DIAGNOSIS — Z881 Allergy status to other antibiotic agents status: Secondary | ICD-10-CM | POA: Insufficient documentation

## 2013-08-17 DIAGNOSIS — E785 Hyperlipidemia, unspecified: Secondary | ICD-10-CM | POA: Insufficient documentation

## 2013-08-17 HISTORY — PX: CARDIOVERSION: SHX1299

## 2013-08-17 SURGERY — CARDIOVERSION
Anesthesia: Monitor Anesthesia Care

## 2013-08-17 MED ORDER — SODIUM CHLORIDE 0.9 % IV SOLN
INTRAVENOUS | Status: DC | PRN
  Administered 2013-08-17 (×2): via INTRAVENOUS

## 2013-08-17 MED ORDER — PROPOFOL 10 MG/ML IV BOLUS
INTRAVENOUS | Status: DC | PRN
  Administered 2013-08-17: 120 mg via INTRAVENOUS

## 2013-08-17 MED ORDER — SODIUM CHLORIDE 0.9 % IV SOLN
INTRAVENOUS | Status: DC
  Administered 2013-08-17: 11:00:00 via INTRAVENOUS

## 2013-08-17 NOTE — H&P (View-Only) (Signed)
PCP:  [name], MD Primary Cardiologist:  Dr Claiborne Billings  The patient presents today for electrophysiology followup.  His afib is stable but persistent.  He recently underwent hand surgery which was uneventful.  He has fatigue and palpitations with afib. Today, he denies symptoms of chest pain, dizziness, presyncope, syncope, or neurologic sequela.  The patient feels that he is tolerating medications without difficulties and is otherwise without complaint today.   Past Medical History  Diagnosis Date  . Paroxysmal atrial fibrillation     s/p afib 2001 and 2009 by Dr Rolland Porter  . Coronary artery disease     no significant CAD by cath 5/12  . Cardiomyopathy     hx of tachycardia mediated CM, now resolved  . Hypertension   . Hyperlipidemia   . Thoracic aortic aneurysm   . Depression   . Sleep apnea     AHI avg 5/hr  . Limb pain 06/20/2007    LE venous doppler - no evidence of thrombus or thrombophlebitis  . PVC (premature ventricular contraction) 12/18/2009    cardionet monitor 21 days - some PVCs, ventricular bigeminy  . Pericardial effusion   . Echocardiogram abnormal     EF60%, mild LVH, PA pk pressure 35, pericardial effusion mild increase from 2012   . Shortness of breath   . Complication of anesthesia     Pt has anxiety issues and thinks he is going to die.  . S/P pericardial operation, 04/25/12, window 04/26/2013   Past Surgical History  Procedure Laterality Date  . Cardiac catheterization  08/25/10    NO SIGNIFICANT CAD  . Cardioversion  10/18/2007    successful  . Elbow / upper arm foreign body removal    . Incision and drainage of wound  09/07/2011    Procedure: IRRIGATION AND DEBRIDEMENT WOUND;  Surgeon: Kerin Salen, MD;  Location: [place];  Service: Orthopedics;  Laterality: Left;  . Transesophageal echocardiogram  08/26/2010    see Notes tab  . Cardiovascular stress test  09/08/2005    R/S MV - EF 61%; Exercises capacity 12 Mets; essentially normal perfusion myocardial scan     . Cardiac electrophysiology mapping and ablation  [id number]    Dr. Tally Due  . Osteosynthesis Left 09/30/2012    with small plate in the ulna  . Cardioversion N/A 02/08/2013    Procedure: CARDIOVERSION;  Surgeon: Troy Sine, MD;  Location: [facility] ENDOSCOPY;  Service: Cardiovascular;  Laterality: N/A;  . Fracture surgery Left     Left wrist  . Subxyphoid pericardial window N/A 04/25/2013    Procedure: SUBXYPHOID PERICARDIAL WINDOW;  Surgeon: Gaye Pollack, MD;  Location: [facility];  Service: Thoracic;  Laterality: N/A;    Current Outpatient Prescriptions  Medication Sig Dispense Refill  . atorvastatin (LIPITOR) 40 MG tablet Take 40 mg by mouth daily.      . Cholecalciferol (VITAMIN D) 2000 UNITS CAPS Take 2,000 Units by mouth daily.      Marland Kitchen diltiazem (CARDIZEM CD) 240 MG 24 hr capsule Take 1 capsule (240 mg total) by mouth daily.  90 capsule  3  . finasteride (PROPECIA) 1 MG tablet Take 1 mg by mouth every morning.       . flecainide (TAMBOCOR) 150 MG tablet Take 1 tablet (150 mg total) by mouth 2 (two) times daily.  60 tablet  6  . furosemide (LASIX) 40 MG tablet Take 40 mg by mouth 2 (two) times daily.       Marland Kitchen LORazepam (ATIVAN) 0.5  MG tablet Take 0.5 mg by mouth at bedtime.       . metoprolol succinate (TOPROL-XL) 100 MG 24 hr tablet Takes 100 mg twice daily  60 tablet  6  . OVER THE COUNTER MEDICATION Taking Tussionex-DM for coughing as needed      . oxyCODONE-acetaminophen (PERCOCET/ROXICET) 5-325 MG per tablet Take 1-2 tablets by mouth every 4 (four) hours as needed for severe pain.  30 tablet  0  . pantoprazole (PROTONIX) 40 MG tablet Take 40 mg by mouth daily.      . potassium chloride SA (K-DUR,KLOR-CON) 20 MEQ tablet Take 1 1/2 tablets daily      . PRADAXA 150 MG CAPS capsule TAKE ONE CAPSULE BY MOUTH TWICE A DAY  180 capsule  3  . Saw Palmetto, Serenoa repens, (SAW PALMETTO PO) Take 900 MG twice a day       No current facility-administered medications for this visit.     Allergies  Allergen Reactions  . Tetracyclines & Related Hives    History   Social History  . Marital Status: Single    Spouse Name: N/A    Number of Children: 0  . Years of Education: N/A   Occupational History  .  [employer]   Social History Main Topics  . Smoking status: Former Smoker -- 1.00 packs/day for 10 years    Quit date: 02/06/1989  . Smokeless tobacco: Never Used  . Alcohol Use: Yes     Comment: 2 bottles of wine per week  . Drug Use: No  . Sexual Activity: Not on file   Other Topics Concern  . 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  He is single and has no children.    Family History  Problem Relation Age of Onset  . Coronary artery disease Father   . Stroke Mother     died  . Atrial fibrillation Brother   . Heart attack Father   . Mitral valve prolapse Sister     ROS-  All systems are reviewed and are negative except as outlined in the HPI above  Physical Exam: Filed Vitals:   08/03/13 1000  BP: 140/90  Pulse: 71  Height: 6' 2" (1.88 m)  Weight: 222 lb (100.699 kg)    GEN- The patient is well appearing, alert and oriented x 3 today.   Head- normocephalic, atraumatic Eyes-  Sclera clear, conjunctiva pink Ears- hearing intact Oropharynx- clear Neck- supple, no JVD Lungs- clear, normal wob Heart- iRRR, no murmurs GI- soft, NT, ND, + BS Extremities- no clubbing, cyanosis,+1 edema MS- L wrist scar is noted Skin- no rash or lesion Psych- euthymic mood, full affect Neuro- strength and sensation are intact  ekg today reveals afib, V rates 71 bpm, septal infarct pattern Echo is reviewed Epic notes are reviewed  Assessment and Plan:  1. Persistent afib He has been back on anticoagulation with pradaxa 150mg BID for at least 4 weeks.  Risks, benefits, and alternatives  to cardioversion were discussed with him today.  He will continue flecainide.  I have instructed him to hold toprol the night before cardioversion and cardizem the morning of cardioversion.  If he does not maintain sinus long term, our options are tikosyn, sotalol, or ablation.  I would favor a hybrid approach to ablation as he   has had two prior ablations already by Dr Wharton  2. Shortness of breath/ edema Much improved with pericardial window procedure  3. OSA Compliance with CPAP is encouraged  4. HTN Stable  Return to see me 4 weeks after cardioversion 

## 2013-08-17 NOTE — Anesthesia Postprocedure Evaluation (Signed)
Anesthesia Post Note  Patient: [name] [name]  Procedure(s) Performed: Procedure(s) (LRB): CARDIOVERSION (N/A)  Anesthesia type: General  Patient location: PACU  Post pain: Pain level controlled  Post assessment: Patient's Cardiovascular Status Stable  Last Vitals:  Filed Vitals:   08/17/13 1200  BP: 140/92  Pulse: 42  Temp:   Resp: 26    Post vital signs: Reviewed and stable  Level of consciousness: alert  Complications: No apparent anesthesia complications

## 2013-08-17 NOTE — Interval H&P Note (Signed)
History and Physical Interval Note:  08/17/2013 10:50 AM  [name] [name]  has presented today for surgery, with the diagnosis of AFIB  The various methods of treatment have been discussed with the patient and family. After consideration of risks, benefits and other options for treatment, the patient has consented to  Procedure(s): CARDIOVERSION (N/A) as a surgical intervention .  The patient's history has been reviewed, patient examined, no change in status, stable for surgery.  I have reviewed the patient's chart and labs.  Questions were answered to the patient's satisfaction.     [name]

## 2013-08-17 NOTE — Discharge Instructions (Signed)
Moderate Sedation, Adult °Moderate sedation is given to help you relax or even sleep through a procedure. You may remain sleepy, be clumsy, or have poor balance for several hours following this procedure. Arrange for a responsible adult, family member, or friend to take you home. A responsible adult should stay with you for at least 24 hours or until the medicines have worn off. °· Do not participate in any activities where you could become injured for the next 24 hours, or until you feel normal again. Do not: °· Drive. °· Swim. °· Ride a bicycle. °· Operate heavy machinery. °· Cook. °· Use power tools. °· Climb ladders. °· Work at heights. °· Do not make important decisions or sign legal documents until you are improved. °· Vomiting may occur if you eat too soon. When you can drink without vomiting, try water, juice, or soup. Try solid foods if you feel little or no nausea. °· Only take over-the-counter or prescription medications for pain, discomfort, or fever as directed by your caregiver.If pain medications have been prescribed for you, ask your caregiver how soon it is safe to take them. °· Make sure you and your family fully understands everything about the medication given to you. Make sure you understand what side effects may occur. °· You should not drink alcohol, take sleeping pills, or medications that cause drowsiness for at least 24 hours. °· If you smoke, do not smoke alone. °· If you are feeling better, you may resume normal activities 24 hours after receiving sedation. °· Keep all appointments as scheduled. Follow all instructions. °· Ask questions if you do not understand. °SEEK MEDICAL CARE IF:  °· Your skin is pale or bluish in color. °· You continue to feel sick to your stomach (nauseous) or throw up (vomit). °· Your pain is getting worse and not helped by medication. °· You have bleeding or swelling. °· You are still sleepy or feeling clumsy after 24 hours. °SEEK IMMEDIATE MEDICAL CARE IF:   °· You develop a rash. °· You have difficulty breathing. °· You develop any type of allergic problem. °· You have a fever. °Document Released: 12/16/2000 Document Revised: 06/15/2011 Document Reviewed: 11/28/2012 °ExitCare® Patient Information ©2014 [employer]. °Electrical Cardioversion °Electrical cardioversion is the delivery of a jolt of electricity to change the rhythm of the heart. Sticky patches or metal paddles are placed on the chest to deliver the electricity from a device. This is done to restore a normal rhythm. A rhythm that is too fast or not regular keeps the heart from pumping well. °Electrical cardioversion is done in an emergency if:  °· There is low or no blood pressure as a result of the heart rhythm.   °· Normal rhythm must be restored as fast as possible to protect the brain and heart from further damage.   °· It may save a life. °Cardioversion may be done for heart rhythms that are not immediately life-threatening, such as atrial fibrillation or flutter, in which:  °· The heart is beating too fast or is not regular.   °· Medicine to change the rhythm has not worked.   °· It is safe to wait in order to allow time for preparation. °· Symptoms of the abnormal rhythm are bothersome. °· The risk of stroke and other serious complications can be reduced. °LET YOUR CAREGIVER KNOW ABOUT:  °· All medicines you are taking, including vitamins, herbs, eye drops, creams, and over-the-counter medicines.   °· Previous problems you or members of your family have had with   the use of anesthetics.   °· Any blood disorders you have.   °· Previous surgeries you have had.   °· Medical conditions you have. °RISKS AND COMPLICATIONS  °Generally, this is a safe procedure. However, as with any procedure, complications can occur. Possible complications include:  °· Breathing problems related to the anesthetic used. °· Cardiac arrest This risk is rare. °· A blood clot that breaks free and travels to other parts of your  body. This could cause a stroke or other problems. The risk of this is lowered by use of blood thinning medicine (anticoagulant) prior to the procedure. °BEFORE THE PROCEDURE  °· You may have tests to detect blood clots in your heart and evaluate heart function.  °· You may start taking anticoagulants so your blood does not clot as easily.   °· Medicines may be given to help stabilize your heart rate and rhythm. °PROCEDURE °· You will be given medicine through an IV tube to reduce discomfort and make you sleepy (sedative).   °· An electrical shock will be delivered. °AFTER THE PROCEDURE °Your heart rhythm will be watched to make sure it does not change. You may be able to go home within a few hours.  °Document Released: 03/13/2002 Document Revised: 01/11/2013 Document Reviewed: 10/05/2012 °ExitCare® Patient Information ©2014 [employer]. ° °

## 2013-08-17 NOTE — Op Note (Addendum)
Procedure: Electrical Cardioversion Indications:  Atrial Fibrillation  Procedure Details:  Consent: Risks of procedure as well as the alternatives and risks of each were explained to the (patient/caregiver).  Consent for procedure obtained.  Time Out: Verified patient identification, verified procedure, site/side was marked, verified correct patient position, special equipment/implants available, medications/allergies/relevent history reviewed, required imaging and test results available.  Performed  Patient placed on cardiac monitor, pulse oximetry, supplemental oxygen as necessary.  Sedation given: Propofol 120 mg IV Anesthesiology, Dr. Vanita Ingles Pacer pads placed anterior and posterior chest.  Cardioverted 1 time(s).  Cardioverted at 150J. Synchronized biphasic  Evaluation: Findings: Post procedure EKG shows: NSR 88 bpm, rare PACs Complications: None Patient did tolerate procedure well.  Time Spent Directly with the Patient:  30 minutes   Sylvia Helms 08/17/2013, 11:25 AM

## 2013-08-17 NOTE — Transfer of Care (Signed)
Immediate Anesthesia Transfer of Care Note  Patient: [name] [name]  Procedure(s) Performed: Procedure(s): CARDIOVERSION (N/A)  Patient Location: PACU and Endoscopy Unit  Anesthesia Type:MAC and General  Level of Consciousness: awake and alert   Airway & Oxygen Therapy: Patient Spontanous Breathing and Patient connected to nasal cannula oxygen  Post-op Assessment: Report given to PACU RN and Post -op Vital signs reviewed and stable  Post vital signs: Reviewed and stable  Complications: No apparent anesthesia complications

## 2013-08-17 NOTE — Anesthesia Preprocedure Evaluation (Addendum)
Anesthesia Evaluation  Patient identified by MRN, date of birth, ID band Patient awake    Reviewed: Allergy & Precautions, H&P , NPO status , Patient's Chart, lab work & pertinent test results, reviewed documented beta blocker date and time   History of Anesthesia Complications (+) history of anesthetic complications  Airway Mallampati: II TM Distance: >3 FB Neck ROM: full    Dental  (+) Dental Advisory Given   Pulmonary neg pulmonary ROS, shortness of breath and with exertion, former smoker,  breath sounds clear to auscultation        Cardiovascular hypertension, + CAD, + Peripheral Vascular Disease and +CHF negative cardio ROS  Atrial Fibrillation Rhythm:regular     Neuro/Psych PSYCHIATRIC DISORDERS negative neurological ROS  negative psych ROS   GI/Hepatic negative GI ROS, Neg liver ROS,   Endo/Other  negative endocrine ROS  Renal/GU negative Renal ROS  negative genitourinary   Musculoskeletal   Abdominal   Peds  Hematology negative hematology ROS (+)   Anesthesia Other Findings See surgeon's H&P   Reproductive/Obstetrics negative OB ROS                          Anesthesia Physical Anesthesia Plan  ASA: III  Anesthesia Plan: General and MAC   Post-op Pain Management:    Induction: Intravenous  Airway Management Planned: Nasal Cannula and Mask  Additional Equipment:   Intra-op Plan:   Post-operative Plan:   Informed Consent: I have reviewed the patients History and Physical, chart, labs and discussed the procedure including the risks, benefits and alternatives for the proposed anesthesia with the patient or authorized representative who has indicated his/her understanding and acceptance.   Dental Advisory Given  Plan Discussed with: CRNA and Surgeon  Anesthesia Plan Comments:         Anesthesia Quick Evaluation

## 2013-08-18 ENCOUNTER — Encounter (HOSPITAL_COMMUNITY): Payer: Self-pay | Admitting: Cardiovascular Disease

## 2013-08-30 ENCOUNTER — Encounter (HOSPITAL_COMMUNITY): Payer: Self-pay | Admitting: Cardiovascular Disease

## 2013-08-30 NOTE — Addendum Note (Signed)
Addendum created 08/30/13 9767 by Fulton Reek, MD   Modules edited: Anesthesia Events, Anesthesia Responsible Staff

## 2013-08-30 NOTE — Addendum Note (Signed)
Addendum created 08/30/13 1101 by Josephine Igo, CRNA   Modules edited: Anesthesia Events, Anesthesia Responsible Staff

## 2013-09-18 ENCOUNTER — Other Ambulatory Visit: Payer: Self-pay

## 2013-09-27 ENCOUNTER — Ambulatory Visit (INDEPENDENT_AMBULATORY_CARE_PROVIDER_SITE_OTHER): Payer: Medicare Other | Admitting: Internal Medicine

## 2013-09-27 ENCOUNTER — Encounter: Payer: Self-pay | Admitting: Internal Medicine

## 2013-09-27 VITALS — BP 121/77 | HR 60 | Ht 74.0 in | Wt 208.0 lb

## 2013-09-27 DIAGNOSIS — R0602 Shortness of breath: Secondary | ICD-10-CM

## 2013-09-27 DIAGNOSIS — I428 Other cardiomyopathies: Secondary | ICD-10-CM

## 2013-09-27 DIAGNOSIS — I4819 Other persistent atrial fibrillation: Secondary | ICD-10-CM

## 2013-09-27 DIAGNOSIS — I10 Essential (primary) hypertension: Secondary | ICD-10-CM

## 2013-09-27 DIAGNOSIS — I429 Cardiomyopathy, unspecified: Secondary | ICD-10-CM

## 2013-09-27 DIAGNOSIS — I480 Paroxysmal atrial fibrillation: Secondary | ICD-10-CM

## 2013-09-27 DIAGNOSIS — I4891 Unspecified atrial fibrillation: Secondary | ICD-10-CM

## 2013-09-27 MED ORDER — METOPROLOL SUCCINATE ER 100 MG PO TB24: 100.0000 mg | ORAL_TABLET | Freq: Two times a day (BID) | ORAL | Status: DC

## 2013-09-27 MED ORDER — DABIGATRAN ETEXILATE MESYLATE 150 MG PO CAPS: 150.0000 mg | ORAL_CAPSULE | Freq: Two times a day (BID) | ORAL | Status: DC

## 2013-09-27 MED ORDER — DILTIAZEM HCL ER COATED BEADS 240 MG PO CP24: 240.0000 mg | ORAL_CAPSULE | Freq: Every day | ORAL | Status: DC

## 2013-09-27 MED ORDER — FLECAINIDE ACETATE 100 MG PO TABS: 150.0000 mg | ORAL_TABLET | Freq: Two times a day (BID) | ORAL | Status: DC

## 2013-09-27 MED ORDER — FUROSEMIDE 40 MG PO TABS: 40.0000 mg | ORAL_TABLET | Freq: Two times a day (BID) | ORAL | Status: DC

## 2013-09-27 MED ORDER — ATORVASTATIN CALCIUM 40 MG PO TABS: 40.0000 mg | ORAL_TABLET | Freq: Every day | ORAL | Status: DC

## 2013-09-27 MED ORDER — POTASSIUM CHLORIDE CRYS ER 20 MEQ PO TBCR: 30.0000 meq | EXTENDED_RELEASE_TABLET | Freq: Every day | ORAL | Status: DC

## 2013-09-27 NOTE — Patient Instructions (Signed)
Your physician recommends that you schedule a follow-up appointment in: Oct 2015 with Dr Rayann Heman  Try and reduce Furosemide to 40mg  daily

## 2013-09-30 NOTE — Progress Notes (Signed)
PCP:  [name], MD Primary Cardiologist:  Dr Claiborne Billings  The patient presents today for electrophysiology followup.  He is doing very well at this time.  He remains in sinus rhythm since his cardioversion in May.  His SOB is resolved. Today, he denies symptoms of chest pain, dizziness, presyncope, syncope, or neurologic sequela.  The patient feels that he is tolerating medications without difficulties and is otherwise without complaint today.   Past Medical History  Diagnosis Date  . Paroxysmal atrial fibrillation     s/p afib 2001 and 2009 by Dr Rolland Porter  . Coronary artery disease     no significant CAD by cath 5/12  . Cardiomyopathy     hx of tachycardia mediated CM, now resolved  . Hypertension   . Hyperlipidemia   . Thoracic aortic aneurysm   . Depression   . Sleep apnea     AHI avg 5/hr  . Limb pain 06/20/2007    LE venous doppler - no evidence of thrombus or thrombophlebitis  . PVC (premature ventricular contraction) 12/18/2009    cardionet monitor 21 days - some PVCs, ventricular bigeminy  . Pericardial effusion   . Echocardiogram abnormal     EF60%, mild LVH, PA pk pressure 35, pericardial effusion mild increase from 2012   . Shortness of breath   . Complication of anesthesia     Pt has anxiety issues and thinks he is going to die.  . S/P pericardial operation, 04/25/12, window 04/26/2013   Past Surgical History  Procedure Laterality Date  . Cardiac catheterization  08/25/10    NO SIGNIFICANT CAD  . Cardioversion  10/18/2007    successful  . Elbow / upper arm foreign body removal    . Incision and drainage of wound  09/07/2011    Procedure: IRRIGATION AND DEBRIDEMENT WOUND;  Surgeon: Kerin Salen, MD;  Location: [facility];  Service: Orthopedics;  Laterality: Left;  . Transesophageal echocardiogram  08/26/2010    see Notes tab  . Cardiovascular stress test  09/08/2005    R/S MV - EF 61%; Exercises capacity 12 Mets; essentially normal perfusion myocardial scan    . Cardiac  electrophysiology mapping and ablation  [id number]    Dr. Tally Due  . Osteosynthesis Left 09/30/2012    with small plate in the ulna  . Cardioversion N/A 02/08/2013    Procedure: CARDIOVERSION;  Surgeon: Troy Sine, MD;  Location: [facility] ENDOSCOPY;  Service: Cardiovascular;  Laterality: N/A;  . Fracture surgery Left     Left wrist  . Subxyphoid pericardial window N/A 04/25/2013    Procedure: SUBXYPHOID PERICARDIAL WINDOW;  Surgeon: Gaye Pollack, MD;  Location: [facility] OR;  Service: Thoracic;  Laterality: N/A;  . Carpal tunnel release Left April 2015  . Cardioversion N/A 08/17/2013    Procedure: CARDIOVERSION;  Surgeon: Sanda Klein, MD;  Location: [facility] ENDOSCOPY;  Service: Cardiovascular;  Laterality: N/A;    Current Outpatient Prescriptions  Medication Sig Dispense Refill  . atorvastatin (LIPITOR) 40 MG tablet Take 1 tablet (40 mg total) by mouth daily.  90 tablet  3  . Cholecalciferol (VITAMIN D) 2000 UNITS CAPS Take 2,000 Units by mouth daily.      . dabigatran (PRADAXA) 150 MG CAPS capsule Take 1 capsule (150 mg total) by mouth 2 (two) times daily.  180 capsule  3  . diltiazem (CARDIZEM CD) 240 MG 24 hr capsule Take 1 capsule (240 mg total) by mouth daily.  90 capsule  3  . finasteride (PROPECIA) 1 MG  tablet Take 1 mg by mouth every morning.       . flecainide (TAMBOCOR) 100 MG tablet Take 1.5 tablets (150 mg total) by mouth 2 (two) times daily.  270 tablet  3  . furosemide (LASIX) 40 MG tablet Take 1 tablet (40 mg total) by mouth 2 (two) times daily.  180 tablet  3  . metoprolol succinate (TOPROL-XL) 100 MG 24 hr tablet Take 1 tablet (100 mg total) by mouth 2 (two) times daily. Take with or immediately following a meal.  180 tablet  3  . pantoprazole (PROTONIX) 40 MG tablet Take 40 mg by mouth at bedtime.       . potassium chloride SA (K-DUR,KLOR-CON) 20 MEQ tablet Take 1.5 tablets (30 mEq total) by mouth daily.  90 tablet  3  . Saw Palmetto, Serenoa repens, (SAW PALMETTO PO) Take 900  mg by mouth 2 (two) times daily.        No current facility-administered medications for this visit.    Allergies  Allergen Reactions  . Tetracyclines & Related Hives    History   Social History  . Marital Status: Single    Spouse Name: N/A    Number of Children: 0  . Years of Education: N/A   Occupational History  .  [place] History Main Topics  . Smoking status: Former Smoker -- 1.00 packs/day for 10 years    Quit date: 02/06/1989  . Smokeless tobacco: Never Used  . Alcohol Use: Yes     Comment: 2 bottles of wine per week  . Drug Use: No  . Sexual Activity: Not on file   Other Topics Concern  . 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    Family History  Problem Relation Age of Onset  . Coronary artery disease Father   . Stroke Mother     died  . Atrial fibrillation Brother   . Heart attack Father   . Mitral valve prolapse Sister     ROS-  All systems are reviewed and are negative except as outlined in the HPI above  Physical Exam: Filed Vitals:   09/27/13 1613  BP: 121/77  Pulse: 60  Height: 6\' 2"  (1.88 m)  Weight: 208 lb (94.348 kg)    GEN- The patient is well appearing, alert and oriented x 3 today.   Head- normocephalic, atraumatic Eyes-  Sclera clear, conjunctiva pink Ears- hearing intact Oropharynx- clear Neck- supple, no JVD Lungs- clear, normal wob Heart- RRR, no murmurs GI- soft, NT, ND, + BS Extremities- no clubbing, cyanosis,trace edema MS- L wrist scar is well healed Skin- no rash or lesion Psych- euthymic mood, full affect Neuro- strength and sensation are intact  ekg today reveals  Sinus rhythm 60 bpm, anterior infarction pattern Echo is reviewed Epic notes are reviewed  Assessment and Plan:  1. Persistent  afib Doing well at this time.  He remains in sinus rhythm.  If he does not maintain sinus long term, our options are tikosyn, sotalol, or ablation.  I would favor a hybrid approach to ablation as he has had two prior ablations already by Dr Rolland Porter. He remains on high doses of beta blocker and CCB.  Though he is tolerating these rather well, I would  like to reduce these.  He is very reluctant to make any changes today as he will be leaving the country for several months and does not want to "rock the boat" in the interim. We will consider reducing his beta blocker upon return.  2. Shortness of breath/ edema Resolved with sinus rhythm Reduce lasix to 40mg  daily.  Daily weights are encouraged Hopefully we can get him off of lasix at some point  3. OSA Compliance with CPAP is encouraged.    4. HTN Stable  Return to see in October.

## 2013-10-02 ENCOUNTER — Other Ambulatory Visit: Payer: Self-pay | Admitting: *Deleted

## 2013-10-02 ENCOUNTER — Telehealth: Payer: Self-pay | Admitting: Internal Medicine

## 2013-10-02 MED ORDER — PANTOPRAZOLE SODIUM 40 MG PO TBEC: 40.0000 mg | DELAYED_RELEASE_TABLET | Freq: Every day | ORAL | Status: DC

## 2013-10-02 NOTE — Telephone Encounter (Signed)
Refilled patient's pantoprazole as requested.

## 2013-10-02 NOTE — Telephone Encounter (Signed)
Yes please refill, some of his shortness of breath may have been related to regurg.

## 2013-10-02 NOTE — Telephone Encounter (Signed)
Please refill pantoprazole, thanks

## 2013-10-02 NOTE — Telephone Encounter (Signed)
New message    Patient calling regarding pantoprazole 40 mg 3 months supply .   [facility] on [address]

## 2013-12-20 ENCOUNTER — Encounter: Payer: Self-pay | Admitting: Internal Medicine

## 2013-12-27 ENCOUNTER — Encounter: Payer: Self-pay | Admitting: *Deleted

## 2013-12-27 ENCOUNTER — Ambulatory Visit (INDEPENDENT_AMBULATORY_CARE_PROVIDER_SITE_OTHER): Payer: Medicare Other | Admitting: Physician Assistant

## 2013-12-27 ENCOUNTER — Encounter: Payer: Self-pay | Admitting: Physician Assistant

## 2013-12-27 VITALS — BP 110/70 | HR 86 | Ht 74.0 in | Wt 220.0 lb

## 2013-12-27 DIAGNOSIS — I319 Disease of pericardium, unspecified: Secondary | ICD-10-CM

## 2013-12-27 DIAGNOSIS — I313 Pericardial effusion (noninflammatory): Secondary | ICD-10-CM

## 2013-12-27 DIAGNOSIS — I480 Paroxysmal atrial fibrillation: Secondary | ICD-10-CM

## 2013-12-27 DIAGNOSIS — I509 Heart failure, unspecified: Secondary | ICD-10-CM

## 2013-12-27 DIAGNOSIS — I3139 Other pericardial effusion (noninflammatory): Secondary | ICD-10-CM

## 2013-12-27 DIAGNOSIS — I77810 Thoracic aortic ectasia: Secondary | ICD-10-CM

## 2013-12-27 DIAGNOSIS — I4891 Unspecified atrial fibrillation: Secondary | ICD-10-CM

## 2013-12-27 DIAGNOSIS — I5033 Acute on chronic diastolic (congestive) heart failure: Secondary | ICD-10-CM

## 2013-12-27 NOTE — Patient Instructions (Addendum)
Your physician recommends that you continue on your current medications as directed. Please refer to the Current Medication list given to you today.   Your physician has requested that you have an echocardiogram. Echocardiography is a painless test that uses sound waves to create images of your heart. It provides your doctor with information about the size and shape of your heart and how well your heart's chambers and valves are working. This procedure takes approximately one hour. There are no restrictions for this procedure.   MAKE SURE YOUR PRIMARY CARE DOCTER HAVE LABS DONE : CBC TSH BNP AND CMET   MAKE SURE  YOUR PRIMARY CARE DOCTER FOLLOWS UP ON LIVER CYSTS ON CT ON 03/2013   Your physician recommends that you schedule a follow-up appointment in: WITH [facility] IN 1 MONTH

## 2013-12-27 NOTE — Progress Notes (Signed)
[address], [place], [place]  [address] Phone: [phone] Fax:  [phone]  Date:  12/27/2013   Patient ID:  Gina, Costilla 1945/11/18, MRN [id number]   PCP:  [name], MD  Cardiologist:  Previously Dr. Claiborne Billings Electrophysiologist:  [facility]   History of Present Illness: TERMAINE ROUPP is a 68 y.o. 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 He presents today for clinic f/u due to going back into AF.  He underwent DCCV 08/2013 and has been doing well on flecainide. Dr. Rayann Heman saw him in 09/2013 and noted "If he does not maintain sinus long term [on flecainide], our options are tikosyn, sotalol, or ablation. I would favor a hybrid approach to ablation as he has had two prior ablations already by Dr Rolland Porter." There is mention in Dr. Evette Georges prior note that the patient told him that remotely he may have been intolerant to amiodarone and possibly Tikosyn, but the patient is now unsure of this. Last echo 05/2013: mild LVH, EF 55-60%, mildly dilated aortic root, mod dilated LA/RA, small pericrdial effusion (smaller). At last OV, Dr. Rayann Heman suggested he wean himself off Lasix and recommended to lower his dose of metoprolol to 100mg  daily since he was on high doses. The patient went to [place] on business, and while there, noted that he was having increasing edema so went back to full dose Lasix. He doesn't know for sure if he was in AF at the time but does note he was more fatigued in the afternoon than usual. On Sunday 12/17/13 however, he definitely felt  the sensation of AF literally immediately after drinking a shot of whisky. He went back to Toprol 100mg  BID and requested an appointment due to recurrence of AF. EKG today confirms this. He is rate controlled. He is able to detect that he doesn't feel quite right, and LEE persists. No significant SOB, nausea, chest pain, syncope. + 8lb weight gain and exacerbation of chronic L>RLE. (This LEE precedes his trip.) No LE erythema, tachycardia or tachypnea. Denies any missed anticoagulation doses and reports good compliance with meds and bipap for OSA.   Recent Labs: 03/14/2013: Pro B Natriuretic peptide (BNP) 58.97  04/24/2013: ALT 30  08/03/2013: Creatinine 1.0; Hemoglobin 12.6*; Potassium 4.4   Wt Readings from Last 3 Encounters:  12/27/13 220 lb (99.791 kg)  09/27/13 208 lb (94.348 kg)  08/03/13 222 lb (100.699 kg)     Past Medical History  Diagnosis Date  . Paroxysmal atrial fibrillation     a. s/p afib ablations 2002 and 2009 by Dr Rolland Porter. b. s/p DCCV 02/2013.   . H/O cardiac catheterization     no significant CAD by cath 5/12  . Cardiomyopathy     a. 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 b. also hx of tachycardia mediated CM, now resolved  . Hypertension   . Hyperlipidemia   . Thoracic aortic aneurysm     a. Borderline enlarged by CT 12/14 at 4.0cm.  . Depression   . OSA (obstructive sleep apnea)     AHI avg 5/hr  . Limb  pain 06/20/2007    LE venous doppler - no evidence of thrombus or thrombophlebitis  . PVC (premature ventricular contraction) 12/18/2009    cardionet monitor 21 days - some PVCs, ventricular bigeminy  . Pericardial effusion     a. Since 2007. b. s/p pericardial window 04/2013.  Marland Kitchen Echocardiogram abnormal     EF60%, mild LVH, PA pk pressure 35, pericardial effusion mild increase from 2012   . Complication of anesthesia     Pt has anxiety issues and thinks he is going to die.  . Liver mass     a. CT 03/2013: Small probable cysts in the liver and  small hyperenhancing liver mass.    Current Outpatient Prescriptions  Medication Sig Dispense Refill  . atorvastatin (LIPITOR) 40 MG tablet Take 1 tablet (40 mg total) by mouth daily.  90 tablet  3  . Cholecalciferol (VITAMIN D) 2000 UNITS CAPS Take 2,000 Units by mouth daily.      . dabigatran (PRADAXA) 150 MG CAPS capsule Take 1 capsule (150 mg total) by mouth 2 (two) times daily.  180 capsule  3  . diltiazem (CARDIZEM CD) 240 MG 24 hr capsule Take 1 capsule (240 mg total) by mouth daily.  90 capsule  3  . finasteride (PROPECIA) 1 MG tablet Take 1 mg by mouth every morning.       . flecainide (TAMBOCOR) 100 MG tablet Take 1.5 tablets (150 mg total) by mouth 2 (two) times daily.  270 tablet  3  . furosemide (LASIX) 40 MG tablet Take 1 tablet (40 mg total) by mouth 2 (two) times daily.  180 tablet  3  . metoprolol succinate (TOPROL-XL) 100 MG 24 hr tablet Take 1 tablet (100 mg total) by mouth 2 (two) times daily. Take with or immediately following a meal.  180 tablet  3  . pantoprazole (PROTONIX) 40 MG tablet Take 1 tablet (40 mg total) by mouth at bedtime.  90 tablet  3  . potassium chloride SA (K-DUR,KLOR-CON) 20 MEQ tablet Take 1.5 tablets (30 mEq total) by mouth daily.  90 tablet  3  . Saw Palmetto, Serenoa repens, (SAW PALMETTO PO) Take 900 mg by mouth 2 (two) times daily.        No current facility-administered medications for this visit.    Allergies:   Tetracyclines & related   Social History:  The patient  reports that he quit smoking about 24 years ago. He has never used smokeless tobacco. He reports that he drinks alcohol. He reports that he does not use illicit drugs.   Family History:  The patient's family history includes Atrial fibrillation in his brother; Coronary artery disease in his father; Heart attack in his father; Mitral valve prolapse in his sister; Stroke in his mother.   ROS:  Please see the history of present illness.   All other systems reviewed and negative. He  did have an incident over 3 weeks ago where he took his PM meds twice, but adjusted his med schedule to accommodate the double dose without missing any doses.  PHYSICAL EXAM:  VS:  BP 110/70  Pulse 86  Ht 6\' 2"  (1.88 m)  Wt 220 lb (99.791 kg)  BMI 28.23 kg/m2 Well nourished, well developed, in no acute distress HEENT: normal Neck: mildly elevated JVD Cardiac:  Irregularly irregular, S1, S2; RRR; no murmur Lungs:  clear to auscultation bilaterally, no wheezing, rhonchi or rales Abd: soft, nontender, no hepatomegaly Ext: Tr-1+ LLE edema, minimal RLE edema, no erythema  or cords Skin: warm and dry Neuro:  moves all extremities spontaneously, no focal abnormalities noted  EKG:  Atrial fibrillation 86bpm, no acute ST-T changes, cannot r/o prior septal infarct, poor R wave progression, no acute change from prior    ASSESSMENT AND PLAN:  1. PAF - back in atrial fib. In general, the flecainide had been working well for him. I discussed with Dr. Rayann Heman who recommended DCCV and continuation of current meds for now. The patient has been compliant with his meds. He also has labs at his PCP's office tomorrow - since the last labs we have on him are from April, I have requested they make sure to get a CBC, TSH, BNP and CMET. Will also update echo. 2. Suspected acute on chronic diastolic CHF but also with history of suspected viral vs tachy-mediated cardiomyopathy - I think his edema is likely driven by his AF. Will continue current Lasix dose and adjust if needed after restoration of NSR. Will reassess echo, given symptoms and the fact that he continues on flecainide. Last EF was normal. 3. Chronic pericardial effusion s/p window 04/2013 - given LEE and mild JVD, will reassess by echo. He is not dyspneic and BP stable. 4. Thoracic aorta dilatation - would consider repeat imaging in December 2015 since his dilatation was stable by CT last year. 5. Liver lesions on imaging 03/2013 - I inquired about the  follow-up of this and he indicates he did not know of this finding. I have asked him to please discuss with his PCP at his appointment next week.  Dispo: F/u Dr. Rayann Heman 1 month post-DCCV.  Signed, Melina Copa, PA-C  12/27/2013 5:38 PM

## 2013-12-28 ENCOUNTER — Encounter (HOSPITAL_COMMUNITY): Payer: Self-pay | Admitting: *Deleted

## 2013-12-28 ENCOUNTER — Encounter (HOSPITAL_COMMUNITY): Payer: Medicare Other | Admitting: Anesthesiology

## 2013-12-28 ENCOUNTER — Ambulatory Visit (HOSPITAL_COMMUNITY): Payer: Medicare Other | Admitting: Anesthesiology

## 2013-12-28 ENCOUNTER — Encounter (HOSPITAL_COMMUNITY)
Admission: RE | Disposition: A | Discharge status: Home / Self Care | Payer: Self-pay | Source: Ambulatory Visit | Attending: Internal Medicine

## 2013-12-28 ENCOUNTER — Ambulatory Visit (HOSPITAL_COMMUNITY)
Admission: RE | Admit: 2013-12-28 | Discharge: 2013-12-28 | Disposition: A | Discharge status: Home / Self Care | Payer: Medicare Other | Source: Ambulatory Visit | Attending: Internal Medicine | Admitting: Internal Medicine

## 2013-12-28 DIAGNOSIS — F329 Major depressive disorder, single episode, unspecified: Secondary | ICD-10-CM | POA: Diagnosis not present

## 2013-12-28 DIAGNOSIS — G4733 Obstructive sleep apnea (adult) (pediatric): Secondary | ICD-10-CM | POA: Diagnosis not present

## 2013-12-28 DIAGNOSIS — Z87891 Personal history of nicotine dependence: Secondary | ICD-10-CM | POA: Insufficient documentation

## 2013-12-28 DIAGNOSIS — Z7901 Long term (current) use of anticoagulants: Secondary | ICD-10-CM | POA: Diagnosis not present

## 2013-12-28 DIAGNOSIS — I480 Paroxysmal atrial fibrillation: Secondary | ICD-10-CM

## 2013-12-28 DIAGNOSIS — I739 Peripheral vascular disease, unspecified: Secondary | ICD-10-CM | POA: Diagnosis not present

## 2013-12-28 DIAGNOSIS — I712 Thoracic aortic aneurysm, without rupture, unspecified: Secondary | ICD-10-CM | POA: Diagnosis not present

## 2013-12-28 DIAGNOSIS — E785 Hyperlipidemia, unspecified: Secondary | ICD-10-CM | POA: Diagnosis not present

## 2013-12-28 DIAGNOSIS — F3289 Other specified depressive episodes: Secondary | ICD-10-CM | POA: Insufficient documentation

## 2013-12-28 DIAGNOSIS — I318 Other specified diseases of pericardium: Secondary | ICD-10-CM | POA: Diagnosis not present

## 2013-12-28 DIAGNOSIS — I4891 Unspecified atrial fibrillation: Secondary | ICD-10-CM | POA: Diagnosis not present

## 2013-12-28 DIAGNOSIS — I2510 Atherosclerotic heart disease of native coronary artery without angina pectoris: Secondary | ICD-10-CM | POA: Diagnosis not present

## 2013-12-28 DIAGNOSIS — I10 Essential (primary) hypertension: Secondary | ICD-10-CM | POA: Diagnosis not present

## 2013-12-28 HISTORY — PX: CARDIOVERSION: SHX1299

## 2013-12-28 LAB — POCT I-STAT 4, (NA,K, GLUC, HGB,HCT)
Glucose, Bld: 98 mg/dL (ref 70–99)
HCT: 42 % (ref 39.0–52.0)
Hemoglobin: 14.3 g/dL (ref 13.0–17.0)
Potassium: 3.8 mEq/L (ref 3.7–5.3)
Sodium: 140 mEq/L (ref 137–147)

## 2013-12-28 SURGERY — CARDIOVERSION
Anesthesia: Monitor Anesthesia Care

## 2013-12-28 MED ORDER — PROPOFOL 10 MG/ML IV BOLUS
INTRAVENOUS | Status: DC | PRN
  Administered 2013-12-28: 90 mg via INTRAVENOUS

## 2013-12-28 MED ORDER — SODIUM CHLORIDE 0.9 % IV SOLN
INTRAVENOUS | Status: DC
  Administered 2013-12-28: 11:00:00 via INTRAVENOUS

## 2013-12-28 MED ORDER — LIDOCAINE HCL (CARDIAC) 20 MG/ML IV SOLN
INTRAVENOUS | Status: DC | PRN
  Administered 2013-12-28: 60 mg via INTRAVENOUS

## 2013-12-28 NOTE — Interval H&P Note (Signed)
History and Physical Interval Note:  12/28/2013 11:29 AM  [name] [name]  has presented today for surgery, with the diagnosis of afib  The various methods of treatment have been discussed with the patient and family. After consideration of risks, benefits and other options for treatment, the patient has consented to  Procedure(s): CARDIOVERSION (N/A) as a surgical intervention .  The patient's history has been reviewed, patient examined, no change in status, stable for surgery.  I have reviewed the patient's chart and labs.  Questions were answered to the patient's satisfaction.     [name]

## 2013-12-28 NOTE — Anesthesia Procedure Notes (Addendum)
Procedures

## 2013-12-28 NOTE — Interval H&P Note (Signed)
History and Physical Interval Note:  12/28/2013 10:58 AM  [name] [name]  has presented today for surgery, with the diagnosis of afib  The various methods of treatment have been discussed with the patient and family. After consideration of risks, benefits and other options for treatment, the patient has consented to  Procedure(s): CARDIOVERSION (N/A) as a surgical intervention .  The patient's history has been reviewed, patient examined, no change in status, stable for surgery.  I have reviewed the patient's chart and labs.  Questions were answered to the patient's satisfaction.     [name]

## 2013-12-28 NOTE — Transfer of Care (Signed)
Immediate Anesthesia Transfer of Care Note  Patient: [name] [name]  Procedure(s) Performed: Procedure(s): CARDIOVERSION (N/A)  Patient Location: Endoscopy Unit  Anesthesia Type:General  Level of Consciousness: awake, alert , oriented and patient cooperative  Airway & Oxygen Therapy: Patient Spontanous Breathing and Patient connected to nasal cannula oxygen  Post-op Assessment: Report given to PACU RN and Post -op Vital signs reviewed and stable  Post vital signs: Reviewed  Complications: No apparent anesthesia complications

## 2013-12-28 NOTE — Anesthesia Preprocedure Evaluation (Signed)
Anesthesia Evaluation  Patient identified by MRN, date of birth, ID band Patient awake    Airway Mallampati: II      Dental   Pulmonary former smoker,          Cardiovascular hypertension, + Peripheral Vascular Disease + dysrhythmias Atrial Fibrillation     Neuro/Psych    GI/Hepatic negative GI ROS, Neg liver ROS,   Endo/Other  negative endocrine ROS  Renal/GU negative Renal ROS     Musculoskeletal   Abdominal   Peds  Hematology   Anesthesia Other Findings   Reproductive/Obstetrics                           Anesthesia Physical Anesthesia Plan  ASA: III  Anesthesia Plan: MAC   Post-op Pain Management:    Induction: Intravenous  Airway Management Planned: Mask  Additional Equipment:   Intra-op Plan:   Post-operative Plan:   Informed Consent: I have reviewed the patients History and Physical, chart, labs and discussed the procedure including the risks, benefits and alternatives for the proposed anesthesia with the patient or authorized representative who has indicated his/her understanding and acceptance.   Dental advisory given  Plan Discussed with: CRNA and Anesthesiologist  Anesthesia Plan Comments:         Anesthesia Quick Evaluation

## 2013-12-28 NOTE — Op Note (Signed)
Patient anestheized with 90 mg Propofol and 60 mg lidocaine With pads in AP position patient cardioverted with 200 J synchronized biphasic energy. 12 lead EKG pending.

## 2013-12-28 NOTE — Discharge Instructions (Signed)

## 2013-12-28 NOTE — H&P (View-Only) (Signed)
[address] [place], [place]  [address] Phone: [phone] Fax:  [phone]  Date:  12/27/2013   Patient ID:  [name], [name] 1946/02/07, MRN [id number]   PCP:  [name], MD  Cardiologist:  Previously Dr. Claiborne Billings Electrophysiologist:  Allred   History of Present Illness: [name] [name] is a 68 y.o. 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 He presents today for clinic f/u due to going back into AF.  He underwent DCCV 08/2013 and has been doing well on flecainide. Dr. Rayann Heman saw him in 09/2013 and noted "If he does not maintain sinus long term [on flecainide], our options are tikosyn, sotalol, or ablation. I would favor a hybrid approach to ablation as he has had two prior ablations already by Dr Rolland Porter." There is mention in Dr. Evette Georges prior note that the patient told him that remotely he may have been intolerant to amiodarone and possibly Tikosyn, but the patient is now unsure of this. Last echo 05/2013: mild LVH, EF 55-60%, mildly dilated aortic root, mod dilated LA/RA, small pericrdial effusion (smaller). At last OV, Dr. Rayann Heman suggested he wean himself off Lasix and recommended to lower his dose of metoprolol to 100mg  daily since he was on high doses. The patient went to [place] on business, and while there, noted that he was having increasing edema so went back to full dose Lasix. He doesn't know for sure if he was in AF at the time but does note he was more fatigued in the afternoon than usual. On Sunday 12/17/13 however, he definitely felt  the sensation of AF literally immediately after drinking a shot of whisky. He went back to Toprol 100mg  BID and requested an appointment due to recurrence of AF. EKG today confirms this. He is rate controlled. He is able to detect that he doesn't feel quite right, and LEE persists. No significant SOB, nausea, chest pain, syncope. + 8lb weight gain and exacerbation of chronic L>RLE. (This LEE precedes his trip.) No LE erythema, tachycardia or tachypnea. Denies any missed anticoagulation doses and reports good compliance with meds and bipap for OSA.   Recent Labs: 03/14/2013: Pro B Natriuretic peptide (BNP) 58.97  04/24/2013: ALT 30  08/03/2013: Creatinine 1.0; Hemoglobin 12.6*; Potassium 4.4   Wt Readings from Last 3 Encounters:  12/27/13 220 lb (99.791 kg)  09/27/13 208 lb (94.348 kg)  08/03/13 222 lb (100.699 kg)     Past Medical History  Diagnosis Date  . Paroxysmal atrial fibrillation     a. s/p afib ablations 2002 and 2009 by Dr Rolland Porter. b. s/p DCCV 02/2013.   . H/O cardiac catheterization     no significant CAD by cath 5/12  . Cardiomyopathy     a. 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 b. also hx of tachycardia mediated CM, now resolved  . Hypertension   . Hyperlipidemia   . Thoracic aortic aneurysm     a. Borderline enlarged by CT 12/14 at 4.0cm.  . Depression   . OSA (obstructive sleep apnea)     AHI avg 5/hr  . Limb  pain 06/20/2007    LE venous doppler - no evidence of thrombus or thrombophlebitis  . PVC (premature ventricular contraction) 12/18/2009    cardionet monitor 21 days - some PVCs, ventricular bigeminy  . Pericardial effusion     a. Since 2007. b. s/p pericardial window 04/2013.  Marland Kitchen Echocardiogram abnormal     EF60%, mild LVH, PA pk pressure 35, pericardial effusion mild increase from 2012   . Complication of anesthesia     Pt has anxiety issues and thinks he is going to die.  . Liver mass     a. CT 03/2013: Small probable cysts in the liver and  small hyperenhancing liver mass.    Current Outpatient Prescriptions  Medication Sig Dispense Refill  . atorvastatin (LIPITOR) 40 MG tablet Take 1 tablet (40 mg total) by mouth daily.  90 tablet  3  . Cholecalciferol (VITAMIN D) 2000 UNITS CAPS Take 2,000 Units by mouth daily.      . dabigatran (PRADAXA) 150 MG CAPS capsule Take 1 capsule (150 mg total) by mouth 2 (two) times daily.  180 capsule  3  . diltiazem (CARDIZEM CD) 240 MG 24 hr capsule Take 1 capsule (240 mg total) by mouth daily.  90 capsule  3  . finasteride (PROPECIA) 1 MG tablet Take 1 mg by mouth every morning.       . flecainide (TAMBOCOR) 100 MG tablet Take 1.5 tablets (150 mg total) by mouth 2 (two) times daily.  270 tablet  3  . furosemide (LASIX) 40 MG tablet Take 1 tablet (40 mg total) by mouth 2 (two) times daily.  180 tablet  3  . metoprolol succinate (TOPROL-XL) 100 MG 24 hr tablet Take 1 tablet (100 mg total) by mouth 2 (two) times daily. Take with or immediately following a meal.  180 tablet  3  . pantoprazole (PROTONIX) 40 MG tablet Take 1 tablet (40 mg total) by mouth at bedtime.  90 tablet  3  . potassium chloride SA (K-DUR,KLOR-CON) 20 MEQ tablet Take 1.5 tablets (30 mEq total) by mouth daily.  90 tablet  3  . Saw Palmetto, Serenoa repens, (SAW PALMETTO PO) Take 900 mg by mouth 2 (two) times daily.        No current facility-administered medications for this visit.    Allergies:   Tetracyclines & related   Social History:  The patient  reports that he quit smoking about 24 years ago. He has never used smokeless tobacco. He reports that he drinks alcohol. He reports that he does not use illicit drugs.   Family History:  The patient's family history includes Atrial fibrillation in his brother; Coronary artery disease in his father; Heart attack in his father; Mitral valve prolapse in his sister; Stroke in his mother.   ROS:  Please see the history of present illness.   All other systems reviewed and negative. He  did have an incident over 3 weeks ago where he took his PM meds twice, but adjusted his med schedule to accommodate the double dose without missing any doses.  PHYSICAL EXAM:  VS:  BP 110/70  Pulse 86  Ht 6\' 2"  (1.88 m)  Wt 220 lb (99.791 kg)  BMI 28.23 kg/m2 Well nourished, well developed, in no acute distress HEENT: normal Neck: mildly elevated JVD Cardiac:  Irregularly irregular, S1, S2; RRR; no murmur Lungs:  clear to auscultation bilaterally, no wheezing, rhonchi or rales Abd: soft, nontender, no hepatomegaly Ext: Tr-1+ LLE edema, minimal RLE edema, no erythema  or cords Skin: warm and dry Neuro:  moves all extremities spontaneously, no focal abnormalities noted  EKG:  Atrial fibrillation 86bpm, no acute ST-T changes, cannot r/o prior septal infarct, poor R wave progression, no acute change from prior    ASSESSMENT AND PLAN:  1. PAF - back in atrial fib. In general, the flecainide had been working well for him. I discussed with Dr. Rayann Heman who recommended DCCV and continuation of current meds for now. The patient has been compliant with his meds. He also has labs at his PCP's office tomorrow - since the last labs we have on him are from April, I have requested they make sure to get a CBC, TSH, BNP and CMET. Will also update echo. 2. Suspected acute on chronic diastolic CHF but also with history of suspected viral vs tachy-mediated cardiomyopathy - I think his edema is likely driven by his AF. Will continue current Lasix dose and adjust if needed after restoration of NSR. Will reassess echo, given symptoms and the fact that he continues on flecainide. Last EF was normal. 3. Chronic pericardial effusion s/p window 04/2013 - given LEE and mild JVD, will reassess by echo. He is not dyspneic and BP stable. 4. Thoracic aorta dilatation - would consider repeat imaging in December 2015 since his dilatation was stable by CT last year. 5. Liver lesions on imaging 03/2013 - I inquired about the  follow-up of this and he indicates he did not know of this finding. I have asked him to please discuss with his PCP at his appointment next week.  Dispo: F/u Dr. Rayann Heman 1 month post-DCCV.  Signed, Melina Copa, PA-C  12/27/2013 5:38 PM

## 2013-12-28 NOTE — Anesthesia Postprocedure Evaluation (Signed)
  Anesthesia Post-op Note  Patient: [name] [name]  Procedure(s) Performed: Procedure(s): CARDIOVERSION (N/A)  Patient Location: PACU and Endoscopy Unit  Anesthesia Type:MAC  Level of Consciousness: awake  Airway and Oxygen Therapy: Patient Spontanous Breathing  Post-op Pain: mild  Post-op Assessment: Post-op Vital signs reviewed  Post-op Vital Signs: Reviewed  Last Vitals:  Filed Vitals:   12/28/13 1320  BP: 104/65  Pulse: 89  Temp:   Resp: 21    Complications: No apparent anesthesia complications

## 2013-12-29 ENCOUNTER — Ambulatory Visit (HOSPITAL_COMMUNITY): Payer: Medicare Other | Attending: Physician Assistant | Admitting: Cardiology

## 2013-12-29 ENCOUNTER — Encounter (HOSPITAL_COMMUNITY): Payer: Self-pay | Admitting: Internal Medicine

## 2013-12-29 DIAGNOSIS — I429 Cardiomyopathy, unspecified: Secondary | ICD-10-CM

## 2013-12-29 DIAGNOSIS — I509 Heart failure, unspecified: Secondary | ICD-10-CM | POA: Insufficient documentation

## 2013-12-29 DIAGNOSIS — I3139 Other pericardial effusion (noninflammatory): Secondary | ICD-10-CM

## 2013-12-29 DIAGNOSIS — I319 Disease of pericardium, unspecified: Secondary | ICD-10-CM | POA: Insufficient documentation

## 2013-12-29 DIAGNOSIS — I313 Pericardial effusion (noninflammatory): Secondary | ICD-10-CM

## 2013-12-29 DIAGNOSIS — I4891 Unspecified atrial fibrillation: Secondary | ICD-10-CM | POA: Insufficient documentation

## 2013-12-29 DIAGNOSIS — E785 Hyperlipidemia, unspecified: Secondary | ICD-10-CM | POA: Diagnosis not present

## 2013-12-29 DIAGNOSIS — I10 Essential (primary) hypertension: Secondary | ICD-10-CM | POA: Insufficient documentation

## 2013-12-29 NOTE — Progress Notes (Signed)
Echo performed. 

## 2014-01-01 ENCOUNTER — Encounter: Payer: Self-pay | Admitting: *Deleted

## 2014-01-03 DIAGNOSIS — H43811 Vitreous degeneration, right eye: Secondary | ICD-10-CM | POA: Insufficient documentation

## 2014-01-13 ENCOUNTER — Telehealth: Payer: Self-pay | Admitting: Adult Health

## 2014-01-13 NOTE — Telephone Encounter (Signed)
[name] is a 68 y/o patient of Dr. Rayann Heman with know history of PAF. He is usually in NSR, but noticed that his HR and become irregular. He called to "check in" and report he change. He continued on anticoagulant therapy and finds that his HR is in the  70's to 80;s. He is uncomfortable but not having pain or DOE. He doesn't like how it feels to be in atrial fib.   He states that he was told by Dr. Rayann Heman that as long as he continues to take the anticoagulant and if is HR is controlled not to worry. He still worries and wants to be seen. I have left a message to have him to seen in the [facility] next week. They will call him for appt.

## 2014-01-17 ENCOUNTER — Ambulatory Visit (INDEPENDENT_AMBULATORY_CARE_PROVIDER_SITE_OTHER): Payer: Medicare Other | Admitting: *Deleted

## 2014-01-17 ENCOUNTER — Encounter: Payer: Self-pay | Admitting: *Deleted

## 2014-01-17 VITALS — BP 115/72 | HR 90

## 2014-01-17 DIAGNOSIS — I480 Paroxysmal atrial fibrillation: Secondary | ICD-10-CM

## 2014-01-17 LAB — CBC WITH DIFFERENTIAL/PLATELET
BASOS ABS: 0 10*3/uL (ref 0.0–0.1)
Basophils Relative: 0.3 % (ref 0.0–3.0)
EOS PCT: 1.3 % (ref 0.0–5.0)
Eosinophils Absolute: 0.1 10*3/uL (ref 0.0–0.7)
HCT: 39 % (ref 39.0–52.0)
HEMOGLOBIN: 12.7 g/dL — AB (ref 13.0–17.0)
LYMPHS ABS: 2 10*3/uL (ref 0.7–4.0)
Lymphocytes Relative: 18.6 % (ref 12.0–46.0)
MCHC: 32.6 g/dL (ref 30.0–36.0)
MCV: 88.2 fl (ref 78.0–100.0)
MONOS PCT: 9.3 % (ref 3.0–12.0)
Monocytes Absolute: 1 10*3/uL (ref 0.1–1.0)
NEUTROS ABS: 7.6 10*3/uL (ref 1.4–7.7)
Neutrophils Relative %: 70.5 % (ref 43.0–77.0)
PLATELETS: 225 10*3/uL (ref 150.0–400.0)
RBC: 4.43 Mil/uL (ref 4.22–5.81)
RDW: 12.7 % (ref 11.5–15.5)
WBC: 10.9 10*3/uL — ABNORMAL HIGH (ref 4.0–10.5)

## 2014-01-17 LAB — BASIC METABOLIC PANEL
BUN: 17 mg/dL (ref 6–23)
CHLORIDE: 100 meq/L (ref 96–112)
CO2: 31 mEq/L (ref 19–32)
Calcium: 9.3 mg/dL (ref 8.4–10.5)
Creatinine, Ser: 1.3 mg/dL (ref 0.4–1.5)
GFR: 61.17 mL/min (ref >60.00)
GLUCOSE: 99 mg/dL (ref 70–99)
POTASSIUM: 3.6 meq/L (ref 3.5–5.1)
SODIUM: 137 meq/L (ref 135–145)

## 2014-01-17 NOTE — Progress Notes (Signed)
Patient ID: [name] [name], male   DOB: 12/19/1945, 68 y.o.   MRN: [id number] Paftient called over the weekend c/o going back into afib.  He was given an appointment with Dr Rayann Heman next Wed 10/21.  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  His EKG shows afib with a HR of 90.  He has not missed any doses of his Pradaxa and wishes to try DCCV again.  Really feels the glass of wine he had on Thurs night is the trigger for this event of which started on Fri.  I have discussed with Dr Rayann Heman and will proceed with DCCV on Friday at 10:00am

## 2014-01-17 NOTE — Patient Instructions (Signed)
Your physician has recommended that you have a Cardioversion (DCCV). Electrical Cardioversion uses a jolt of electricity to your heart either through paddles or wired patches attached to your chest. This is a controlled, usually prescheduled, procedure. Defibrillation is done under light anesthesia in the hospital, and you usually go home the day of the procedure. This is done to get your heart back into a normal rhythm. You are not awake for the procedure. Please see the instruction sheet given to you today.  Please arrive at [facility] at 8:30am fo Fri 01/19/14  Do not eat or drink after midnight the night before

## 2014-01-18 ENCOUNTER — Encounter (HOSPITAL_COMMUNITY): Payer: Self-pay | Admitting: Pharmacy Technician

## 2014-01-19 ENCOUNTER — Ambulatory Visit (HOSPITAL_COMMUNITY)
Admission: RE | Admit: 2014-01-19 | Discharge: 2014-01-19 | Disposition: A | Discharge status: Home / Self Care | Payer: Medicare Other | Source: Ambulatory Visit | Attending: Cardiology | Admitting: Cardiology

## 2014-01-19 ENCOUNTER — Encounter (HOSPITAL_COMMUNITY): Payer: Medicare Other | Admitting: Anesthesiology

## 2014-01-19 ENCOUNTER — Encounter (HOSPITAL_COMMUNITY): Payer: Self-pay

## 2014-01-19 ENCOUNTER — Encounter (HOSPITAL_COMMUNITY)
Admission: RE | Disposition: A | Discharge status: Home / Self Care | Payer: Self-pay | Source: Ambulatory Visit | Attending: Cardiology

## 2014-01-19 ENCOUNTER — Ambulatory Visit (HOSPITAL_COMMUNITY): Payer: Medicare Other | Admitting: Anesthesiology

## 2014-01-19 DIAGNOSIS — Z87891 Personal history of nicotine dependence: Secondary | ICD-10-CM | POA: Diagnosis not present

## 2014-01-19 DIAGNOSIS — I480 Paroxysmal atrial fibrillation: Secondary | ICD-10-CM | POA: Diagnosis present

## 2014-01-19 DIAGNOSIS — G4733 Obstructive sleep apnea (adult) (pediatric): Secondary | ICD-10-CM | POA: Insufficient documentation

## 2014-01-19 DIAGNOSIS — Z79899 Other long term (current) drug therapy: Secondary | ICD-10-CM | POA: Diagnosis not present

## 2014-01-19 DIAGNOSIS — I429 Cardiomyopathy, unspecified: Secondary | ICD-10-CM | POA: Diagnosis not present

## 2014-01-19 DIAGNOSIS — I10 Essential (primary) hypertension: Secondary | ICD-10-CM | POA: Diagnosis not present

## 2014-01-19 DIAGNOSIS — I712 Thoracic aortic aneurysm, without rupture: Secondary | ICD-10-CM | POA: Insufficient documentation

## 2014-01-19 DIAGNOSIS — E785 Hyperlipidemia, unspecified: Secondary | ICD-10-CM | POA: Diagnosis not present

## 2014-01-19 HISTORY — PX: CARDIOVERSION: SHX1299

## 2014-01-19 SURGERY — CARDIOVERSION
Anesthesia: General

## 2014-01-19 MED ORDER — SODIUM CHLORIDE 0.9 % IV SOLN
INTRAVENOUS | Status: DC
  Administered 2014-01-19: 09:00:00 via INTRAVENOUS

## 2014-01-19 MED ORDER — LIDOCAINE HCL (CARDIAC) 20 MG/ML IV SOLN
INTRAVENOUS | Status: DC | PRN
  Administered 2014-01-19: 20 mg via INTRAVENOUS

## 2014-01-19 MED ORDER — PROPOFOL 10 MG/ML IV BOLUS
INTRAVENOUS | Status: DC | PRN
  Administered 2014-01-19: 80 mg via INTRAVENOUS

## 2014-01-19 MED ORDER — PROPOFOL 10 MG/ML IV BOLUS
INTRAVENOUS | Status: AC
  Filled 2014-01-19: qty 20

## 2014-01-19 NOTE — Anesthesia Postprocedure Evaluation (Signed)
  Anesthesia Post-op Note  Patient: [name] [name]  Procedure(s) Performed: Procedure(s): CARDIOVERSION (N/A)  Patient Location: Endoscopy Unit  Anesthesia Type:General  Level of Consciousness: awake, alert  and oriented  Airway and Oxygen Therapy: Patient Spontanous Breathing and Patient connected to nasal cannula oxygen  Post-op Pain: none  Post-op Assessment: Post-op Vital signs reviewed, Respiratory Function Stable and Patent Airway  Post-op Vital Signs: Reviewed  Last Vitals:  Filed Vitals:   01/19/14 0943  BP: 118/82  Pulse: 75  Temp:   Resp: 18    Complications: No apparent anesthesia complications

## 2014-01-19 NOTE — H&P (View-Only) (Signed)
[address] [place], [place]  [address] Phone: [phone] Fax:  [phone]  Date:  12/27/2013   Patient ID:  [name] [name], [name] [name] 11-25-1945, MRN [id number]   PCP:  [name], MD  Cardiologist:  Previously Dr. Claiborne Billings Electrophysiologist:  Allred   History of Present Illness: [name] [name] is a 68 y.o. 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 He presents today for clinic f/u due to going back into AF.  He underwent DCCV 08/2013 and has been doing well on flecainide. Dr. Rayann Heman saw him in 09/2013 and noted "If he does not maintain sinus long term [on flecainide], our options are tikosyn, sotalol, or ablation. I would favor a hybrid approach to ablation as he has had two prior ablations already by Dr Rolland Porter." There is mention in Dr. Evette Georges prior note that the patient told him that remotely he may have been intolerant to amiodarone and possibly Tikosyn, but the patient is now unsure of this. Last echo 05/2013: mild LVH, EF 55-60%, mildly dilated aortic root, mod dilated LA/RA, small pericrdial effusion (smaller). At last OV, Dr. Rayann Heman suggested he wean himself off Lasix and recommended to lower his dose of metoprolol to 100mg  daily since he was on high doses. The patient went to [place] on business, and while there, noted that he was having increasing edema so went back to full dose Lasix. He doesn't know for sure if he was in AF at the time but does note he was more fatigued in the afternoon than usual. On Sunday 12/17/13 however, he definitely felt  the sensation of AF literally immediately after drinking a shot of whisky. He went back to Toprol 100mg  BID and requested an appointment due to recurrence of AF. EKG today confirms this. He is rate controlled. He is able to detect that he doesn't feel quite right, and LEE persists. No significant SOB, nausea, chest pain, syncope. + 8lb weight gain and exacerbation of chronic L>RLE. (This LEE precedes his trip.) No LE erythema, tachycardia or tachypnea. Denies any missed anticoagulation doses and reports good compliance with meds and bipap for OSA.   Recent Labs: 03/14/2013: Pro B Natriuretic peptide (BNP) 58.97  04/24/2013: ALT 30  08/03/2013: Creatinine 1.0; Hemoglobin 12.6*; Potassium 4.4   Wt Readings from Last 3 Encounters:  12/27/13 220 lb (99.791 kg)  09/27/13 208 lb (94.348 kg)  08/03/13 222 lb (100.699 kg)     Past Medical History  Diagnosis Date  . Paroxysmal atrial fibrillation     a. s/p afib ablations 2002 and 2009 by Dr Rolland Porter. b. s/p DCCV 02/2013.   . H/O cardiac catheterization     no significant CAD by cath 5/12  . Cardiomyopathy     a. 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 b. also hx of tachycardia mediated CM, now resolved  . Hypertension   . Hyperlipidemia   . Thoracic aortic aneurysm     a. Borderline enlarged by CT 12/14 at 4.0cm.  . Depression   . OSA (obstructive sleep apnea)     AHI avg 5/hr  . Limb  pain 06/20/2007    LE venous doppler - no evidence of thrombus or thrombophlebitis  . PVC (premature ventricular contraction) 12/18/2009    cardionet monitor 21 days - some PVCs, ventricular bigeminy  . Pericardial effusion     a. Since 2007. b. s/p pericardial window 04/2013.  Marland Kitchen Echocardiogram abnormal     EF60%, mild LVH, PA pk pressure 35, pericardial effusion mild increase from 2012   . Complication of anesthesia     Pt has anxiety issues and thinks he is going to die.  . Liver mass     a. CT 03/2013: Small probable cysts in the liver and  small hyperenhancing liver mass.    Current Outpatient Prescriptions  Medication Sig Dispense Refill  . atorvastatin (LIPITOR) 40 MG tablet Take 1 tablet (40 mg total) by mouth daily.  90 tablet  3  . Cholecalciferol (VITAMIN D) 2000 UNITS CAPS Take 2,000 Units by mouth daily.      . dabigatran (PRADAXA) 150 MG CAPS capsule Take 1 capsule (150 mg total) by mouth 2 (two) times daily.  180 capsule  3  . diltiazem (CARDIZEM CD) 240 MG 24 hr capsule Take 1 capsule (240 mg total) by mouth daily.  90 capsule  3  . finasteride (PROPECIA) 1 MG tablet Take 1 mg by mouth every morning.       . flecainide (TAMBOCOR) 100 MG tablet Take 1.5 tablets (150 mg total) by mouth 2 (two) times daily.  270 tablet  3  . furosemide (LASIX) 40 MG tablet Take 1 tablet (40 mg total) by mouth 2 (two) times daily.  180 tablet  3  . metoprolol succinate (TOPROL-XL) 100 MG 24 hr tablet Take 1 tablet (100 mg total) by mouth 2 (two) times daily. Take with or immediately following a meal.  180 tablet  3  . pantoprazole (PROTONIX) 40 MG tablet Take 1 tablet (40 mg total) by mouth at bedtime.  90 tablet  3  . potassium chloride SA (K-DUR,KLOR-CON) 20 MEQ tablet Take 1.5 tablets (30 mEq total) by mouth daily.  90 tablet  3  . Saw Palmetto, Serenoa repens, (SAW PALMETTO PO) Take 900 mg by mouth 2 (two) times daily.        No current facility-administered medications for this visit.    Allergies:   Tetracyclines & related   Social History:  The patient  reports that he quit smoking about 24 years ago. He has never used smokeless tobacco. He reports that he drinks alcohol. He reports that he does not use illicit drugs.   Family History:  The patient's family history includes Atrial fibrillation in his brother; Coronary artery disease in his father; Heart attack in his father; Mitral valve prolapse in his sister; Stroke in his mother.   ROS:  Please see the history of present illness.   All other systems reviewed and negative. He  did have an incident over 3 weeks ago where he took his PM meds twice, but adjusted his med schedule to accommodate the double dose without missing any doses.  PHYSICAL EXAM:  VS:  BP 110/70  Pulse 86  Ht 6\' 2"  (1.88 m)  Wt 220 lb (99.791 kg)  BMI 28.23 kg/m2 Well nourished, well developed, in no acute distress HEENT: normal Neck: mildly elevated JVD Cardiac:  Irregularly irregular, S1, S2; RRR; no murmur Lungs:  clear to auscultation bilaterally, no wheezing, rhonchi or rales Abd: soft, nontender, no hepatomegaly Ext: Tr-1+ LLE edema, minimal RLE edema, no erythema  or cords Skin: warm and dry Neuro:  moves all extremities spontaneously, no focal abnormalities noted  EKG:  Atrial fibrillation 86bpm, no acute ST-T changes, cannot r/o prior septal infarct, poor R wave progression, no acute change from prior    ASSESSMENT AND PLAN:  1. PAF - back in atrial fib. In general, the flecainide had been working well for him. I discussed with Dr. Rayann Heman who recommended DCCV and continuation of current meds for now. The patient has been compliant with his meds. He also has labs at his PCP's office tomorrow - since the last labs we have on him are from April, I have requested they make sure to get a CBC, TSH, BNP and CMET. Will also update echo. 2. Suspected acute on chronic diastolic CHF but also with history of suspected viral vs tachy-mediated cardiomyopathy - I think his edema is likely driven by his AF. Will continue current Lasix dose and adjust if needed after restoration of NSR. Will reassess echo, given symptoms and the fact that he continues on flecainide. Last EF was normal. 3. Chronic pericardial effusion s/p window 04/2013 - given LEE and mild JVD, will reassess by echo. He is not dyspneic and BP stable. 4. Thoracic aorta dilatation - would consider repeat imaging in December 2015 since his dilatation was stable by CT last year. 5. Liver lesions on imaging 03/2013 - I inquired about the  follow-up of this and he indicates he did not know of this finding. I have asked him to please discuss with his PCP at his appointment next week.  Dispo: F/u Dr. Rayann Heman 1 month post-DCCV.  Signed, Melina Copa, PA-C  12/27/2013 5:38 PM

## 2014-01-19 NOTE — Transfer of Care (Signed)
Immediate Anesthesia Transfer of Care Note  Patient: [name] [name]  Procedure(s) Performed: Procedure(s): CARDIOVERSION (N/A)  Patient Location: PACU  Anesthesia Type:General  Level of Consciousness: awake, alert  and patient cooperative  Airway & Oxygen Therapy: Patient Spontanous Breathing and Patient connected to nasal cannula oxygen  Post-op Assessment: Report given to PACU RN, Post -op Vital signs reviewed and stable and Patient moving all extremities  Post vital signs: Reviewed and stable  Complications: No apparent anesthesia complications

## 2014-01-19 NOTE — Interval H&P Note (Signed)
History and Physical Interval Note:  01/19/2014 8:39 AM  [name]  has presented today for surgery, with the diagnosis of A FIB  The various methods of treatment have been discussed with the patient and family. After consideration of risks, benefits and other options for treatment, the patient has consented to  Procedure(s): CARDIOVERSION (N/A) as a surgical intervention .  The patient's history has been reviewed, patient examined, no change in status, stable for surgery.  I have reviewed the patient's chart and labs.  Questions were answered to the patient's satisfaction.     Catricia Scheerer

## 2014-01-19 NOTE — Op Note (Signed)
Procedure: Electrical Cardioversion Indications:  Atrial Fibrillation  Procedure Details:  Consent: Risks of procedure as well as the alternatives and risks of each were explained to the (patient/caregiver).  Consent for procedure obtained.  Time Out: Verified patient identification, verified procedure, site/side was marked, verified correct patient position, special equipment/implants available, medications/allergies/relevent history reviewed, required imaging and test results available.  Performed  Patient placed on cardiac monitor, pulse oximetry, supplemental oxygen as necessary.  Sedation given: propofol IV, Dr. Linna Caprice Pacer pads placed anterior and posterior chest.  Cardioverted 1 time(s).  Cardioverted at 200J sync biphasic  Evaluation: Findings: Post procedure EKG shows: NSR 62 bpm Complications: None Patient did tolerate procedure well.  Time Spent Directly with the Patient:  45 minutes   [name] [name] 01/19/2014, 9:39 AM

## 2014-01-19 NOTE — Anesthesia Preprocedure Evaluation (Addendum)
Anesthesia Evaluation  Patient identified by MRN, date of birth, ID band Patient awake    Reviewed: Allergy & Precautions, H&P , NPO status , Patient's Chart, lab work & pertinent test results  Airway Mallampati: II TM Distance: >3 FB Neck ROM: Full    Dental  (+) Teeth Intact, Dental Advisory Given   Pulmonary former smoker,  breath sounds clear to auscultation        Cardiovascular hypertension, Rhythm:Irregular Rate:Normal     Neuro/Psych    GI/Hepatic   Endo/Other    Renal/GU      Musculoskeletal   Abdominal   Peds  Hematology   Anesthesia Other Findings   Reproductive/Obstetrics                          Anesthesia Physical Anesthesia Plan  ASA: III  Anesthesia Plan: General   Post-op Pain Management:    Induction: Intravenous  Airway Management Planned: Mask  Additional Equipment:   Intra-op Plan:   Post-operative Plan:   Informed Consent: I have reviewed the patients History and Physical, chart, labs and discussed the procedure including the risks, benefits and alternatives for the proposed anesthesia with the patient or authorized representative who has indicated his/her understanding and acceptance.   Dental advisory given  Plan Discussed with: CRNA and Anesthesiologist  Anesthesia Plan Comments:         Anesthesia Quick Evaluation

## 2014-01-22 ENCOUNTER — Encounter (HOSPITAL_COMMUNITY): Payer: Self-pay | Admitting: Cardiovascular Disease

## 2014-01-24 ENCOUNTER — Ambulatory Visit (INDEPENDENT_AMBULATORY_CARE_PROVIDER_SITE_OTHER): Payer: Medicare Other | Admitting: Internal Medicine

## 2014-01-24 ENCOUNTER — Encounter: Payer: Self-pay | Admitting: Internal Medicine

## 2014-01-24 VITALS — BP 122/74 | HR 89 | Ht 74.0 in | Wt 214.0 lb

## 2014-01-24 DIAGNOSIS — I10 Essential (primary) hypertension: Secondary | ICD-10-CM

## 2014-01-24 DIAGNOSIS — I481 Persistent atrial fibrillation: Secondary | ICD-10-CM

## 2014-01-24 DIAGNOSIS — I4819 Other persistent atrial fibrillation: Secondary | ICD-10-CM

## 2014-01-24 NOTE — Patient Instructions (Signed)
Your physician has recommended you make the following change in your medication:  1) Stop Flecainide

## 2014-01-25 NOTE — Progress Notes (Signed)
PCP:  [name], MD Primary Cardiologist:  Dr Claiborne Billings  The patient presents today for electrophysiology followup.  He is again struggling with AF.  He has had several cardioversions recently and remains in afib today.  He has symptoms of fatigue.  His SOB and edema are presently controlled.  Today, he denies symptoms of chest pain, dizziness, presyncope, syncope, or neurologic sequela.  The patient feels that he is tolerating medications without difficulties and is otherwise without complaint today.   Past Medical History  Diagnosis Date  . Paroxysmal atrial fibrillation     a. s/p afib ablations 2002 and 2009 by Dr Rolland Porter. b. s/p DCCV 02/2013.   . H/O cardiac catheterization     no significant CAD by cath 5/12  . Cardiomyopathy     a. 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 b. also hx of tachycardia mediated CM, now resolved  . Hypertension   . Hyperlipidemia   . Thoracic aortic aneurysm     a. Borderline enlarged by CT 12/14 at 4.0cm.  . Depression   . OSA (obstructive sleep apnea)     AHI avg 5/hr  . Limb pain 06/20/2007    LE venous doppler - no evidence of thrombus or thrombophlebitis  . PVC (premature ventricular contraction) 12/18/2009    cardionet monitor 21 days - some PVCs, ventricular bigeminy  . Pericardial effusion     a. Since 2007. b. s/p pericardial window 04/2013.  Marland Kitchen Echocardiogram abnormal     EF60%, mild LVH, PA pk pressure 35, pericardial effusion mild increase from 2012   . Complication of anesthesia     Pt has anxiety issues and thinks he is going to die.  . Liver mass     a. CT 03/2013: Small probable cysts in the liver and small hyperenhancing liver mass.   Past Surgical History  Procedure Laterality Date  . Cardiac catheterization  08/25/10    NO SIGNIFICANT CAD  . Cardioversion  10/18/2007    successful  . Elbow / upper arm foreign body removal    . Incision and drainage of wound  09/07/2011    Procedure: IRRIGATION AND DEBRIDEMENT  WOUND;  Surgeon: Kerin Salen, MD;  Location: [place];  Service: Orthopedics;  Laterality: Left;  . Transesophageal echocardiogram  08/26/2010    see Notes tab  . Cardiovascular stress test  09/08/2005    R/S MV - EF 61%; Exercises capacity 12 Mets; essentially normal perfusion myocardial scan    . Cardiac electrophysiology mapping and ablation  [id number]    Dr. Tally Due  . Osteosynthesis Left 09/30/2012    with small plate in the ulna  . Cardioversion N/A 02/08/2013    Procedure: CARDIOVERSION;  Surgeon: Troy Sine, MD;  Location: [facility] ENDOSCOPY;  Service: Cardiovascular;  Laterality: N/A;  . Fracture surgery Left     Left wrist  . Subxyphoid pericardial window N/A 04/25/2013    Procedure: SUBXYPHOID PERICARDIAL WINDOW;  Surgeon: Gaye Pollack, MD;  Location: [facility] [facility];  Service: Thoracic;  Laterality: N/A;  . Carpal tunnel release Left April 2015  . Cardioversion N/A 08/17/2013    Procedure: CARDIOVERSION;  Surgeon: Sanda Klein, MD;  Location: [place];  Service: Cardiovascular;  Laterality: N/A;  . Cardioversion N/A 12/28/2013    Procedure: CARDIOVERSION;  Surgeon: Fay Records, MD;  Location: [place];  Service: Cardiovascular;  Laterality: N/A;  . Cardioversion N/A 01/19/2014    Procedure: CARDIOVERSION;  Surgeon: Sanda Klein, MD;  Location: [facility]  ENDOSCOPY;  Service: Cardiovascular;  Laterality: N/A;    Current Outpatient Prescriptions  Medication Sig Dispense Refill  . atorvastatin (LIPITOR) 40 MG tablet Take 1 tablet (40 mg total) by mouth daily.  90 tablet  3  . Cholecalciferol (VITAMIN D) 2000 UNITS CAPS Take 2,000 Units by mouth daily.      . dabigatran (PRADAXA) 150 MG CAPS capsule Take 1 capsule (150 mg total) by mouth 2 (two) times daily.  180 capsule  3  . diltiazem (CARDIZEM CD) 240 MG 24 hr capsule Take 1 capsule (240 mg total) by mouth daily.  90 capsule  3  . finasteride (PROPECIA) 1 MG tablet Take 1 mg by mouth every morning.       . furosemide (LASIX) 40 MG  tablet Take 1 tablet (40 mg total) by mouth 2 (two) times daily.  180 tablet  3  . metoprolol succinate (TOPROL-XL) 100 MG 24 hr tablet Take 1 tablet (100 mg total) by mouth 2 (two) times daily. Take with or immediately following a meal.  180 tablet  3  . pantoprazole (PROTONIX) 40 MG tablet Take 1 tablet (40 mg total) by mouth at bedtime.  90 tablet  3  . potassium chloride SA (K-DUR,KLOR-CON) 20 MEQ tablet Take 10-20 mEq by mouth 2 (two) times daily. Take 1/2 tablet in the morning and 1 tablet in the evening.      . Saw Palmetto, Serenoa repens, (SAW PALMETTO PO) Take 900 mg by mouth 2 (two) times daily.        No current facility-administered medications for this visit.    Allergies  Allergen Reactions  . Tetracyclines & Related Hives    History   Social History  . Marital Status: Single    Spouse Name: N/A    Number of Children: 0  . Years of Education: N/A   Occupational History  .  [place] History Main Topics  . Smoking status: Former Smoker -- 1.00 packs/day for 10 years    Quit date: 02/06/1989  . Smokeless tobacco: Never Used  . Alcohol Use: Yes     Comment: 2 bottles of wine per week  . Drug Use: No  . Sexual Activity: Not on file   Other Topics Concern  . 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    Family History  Problem Relation Age of Onset  . Coronary artery disease Father   . Stroke Mother     died  . Atrial fibrillation Brother   . Heart attack Father   . Mitral valve prolapse Sister     ROS-  All systems are reviewed and are negative except as outlined in the HPI above  Physical Exam: Filed Vitals:   01/24/14 1453  BP: 122/74  Pulse: 89  Height: 6\' 2"  (1.88 m)  Weight: 214 lb (97.07 kg)    GEN- The patient is well appearing,  alert and oriented x 3 today.   Head- normocephalic, atraumatic Eyes-  Sclera clear, conjunctiva pink Ears- hearing intact Oropharynx- clear Neck- supple, no JVD Lungs- clear, normal wob Heart- rRRR, no murmurs GI- soft, NT, ND, + BS Extremities- no clubbing, cyanosis,trace edema MS- L wrist scar is well healed Skin- no rash or lesion Psych- euthymic mood,  full affect Neuro- strength and sensation are intact  ekg today reveals afib 67 bpm, septal infarction pattern, qtc 412 Echo is reviewed Epic notes are reviewed  Assessment and Plan:  1. Persistent afib He continues to struggle with symptomatic persistent afib.  He is s/p prior ablation at [facility] Therapeutic strategies for afib including medicine and ablation were discussed in detail with the patient today.  We discussed medicine options of tikosyn and amiodarone as well as risks with each.  We also discussed MAZE vs hybrid ablation and pros/cons of each.  Given prior pericardial window this procedure may prove to be a challenge. At this time, he would like to proceed with tikosyn. I will therefore refer to the [place] clinic for initiation of tikosyn.  2. Shortness of breath/ edema Resolved with sinus rhythm  3. OSA Compliance with CPAP is encouraged.    4. HTN Stable  Arrange tikosyn admit and then follow-up with me in 4-6 weeks

## 2014-02-09 ENCOUNTER — Telehealth: Payer: Self-pay | Admitting: Internal Medicine

## 2014-02-09 NOTE — Telephone Encounter (Signed)
New message  Pt called states that he has a chemical cardioversion scheduled for Wednesday Feb 14, 2014. (Nothing is scheduled at this time) he is upset and says that he will call Ms. Emeline Darling to find out why he does not have an appt.. Please call the pt back to discuss.

## 2014-02-09 NOTE — Telephone Encounter (Signed)
Spoke with patient and went and discussed with Gay Filler.  We have arranged for him to come in on Thurs 02/15/14 at 11:00 for Tikosyn admission.  He will hold his Furosemide on Wed and Thurs am prior to coming.  I explained to patient that I was not sure where the 02/14/14 date came from and apologized that he had not gotten a call to come in yet.

## 2014-02-15 ENCOUNTER — Ambulatory Visit: Payer: Medicare Other | Admitting: Pharmacist

## 2014-02-16 ENCOUNTER — Telehealth: Payer: Self-pay | Admitting: Pharmacist

## 2014-02-16 NOTE — Telephone Encounter (Signed)
New message      Talk to Sally----have an tikosyn appt on mon

## 2014-02-16 NOTE — Telephone Encounter (Signed)
Discussed with Dr. Rayann Heman.  Would prefer to wait 3 weeks but probably low risk of clot development if he wanted to proceed on Monday.  Discussed with pt.  He would feel more comfortable waiting.  We were able to reschedule to 03/09/14.

## 2014-02-16 NOTE — Telephone Encounter (Signed)
Spoke with pt.  He missed a dose of Pradaxa last night.  He is scheduled for Tikosyn start on Monday.  He wants to make sure nothing needs to be changed.  Pt states he is always in afib now.  Will discuss with Dr. Rayann Heman then let pt know.

## 2014-02-19 ENCOUNTER — Ambulatory Visit: Payer: Medicare Other | Admitting: Pharmacist

## 2014-03-09 ENCOUNTER — Inpatient Hospital Stay (HOSPITAL_COMMUNITY)
Admission: AD | Admit: 2014-03-09 | Discharge: 2014-03-12 | DRG: 309 | Disposition: A | Discharge status: Home / Self Care | Payer: Medicare Other | Source: Ambulatory Visit | Attending: Internal Medicine | Admitting: Internal Medicine

## 2014-03-09 ENCOUNTER — Ambulatory Visit (INDEPENDENT_AMBULATORY_CARE_PROVIDER_SITE_OTHER): Payer: Medicare Other | Admitting: Pharmacist

## 2014-03-09 ENCOUNTER — Encounter (HOSPITAL_COMMUNITY): Payer: Self-pay | Admitting: Physician Assistant

## 2014-03-09 ENCOUNTER — Encounter: Payer: Self-pay | Admitting: Internal Medicine

## 2014-03-09 DIAGNOSIS — Z7901 Long term (current) use of anticoagulants: Secondary | ICD-10-CM

## 2014-03-09 DIAGNOSIS — I429 Cardiomyopathy, unspecified: Secondary | ICD-10-CM | POA: Diagnosis present

## 2014-03-09 DIAGNOSIS — I10 Essential (primary) hypertension: Secondary | ICD-10-CM | POA: Diagnosis present

## 2014-03-09 DIAGNOSIS — R160 Hepatomegaly, not elsewhere classified: Secondary | ICD-10-CM | POA: Diagnosis present

## 2014-03-09 DIAGNOSIS — Z8249 Family history of ischemic heart disease and other diseases of the circulatory system: Secondary | ICD-10-CM | POA: Diagnosis not present

## 2014-03-09 DIAGNOSIS — I481 Persistent atrial fibrillation: Secondary | ICD-10-CM | POA: Diagnosis present

## 2014-03-09 DIAGNOSIS — Z823 Family history of stroke: Secondary | ICD-10-CM | POA: Diagnosis not present

## 2014-03-09 DIAGNOSIS — I4891 Unspecified atrial fibrillation: Secondary | ICD-10-CM | POA: Diagnosis present

## 2014-03-09 DIAGNOSIS — F329 Major depressive disorder, single episode, unspecified: Secondary | ICD-10-CM | POA: Diagnosis present

## 2014-03-09 DIAGNOSIS — Z881 Allergy status to other antibiotic agents status: Secondary | ICD-10-CM

## 2014-03-09 DIAGNOSIS — I493 Ventricular premature depolarization: Secondary | ICD-10-CM | POA: Diagnosis present

## 2014-03-09 DIAGNOSIS — I480 Paroxysmal atrial fibrillation: Secondary | ICD-10-CM | POA: Diagnosis present

## 2014-03-09 DIAGNOSIS — G4733 Obstructive sleep apnea (adult) (pediatric): Secondary | ICD-10-CM | POA: Diagnosis present

## 2014-03-09 DIAGNOSIS — I712 Thoracic aortic aneurysm, without rupture: Secondary | ICD-10-CM | POA: Diagnosis present

## 2014-03-09 DIAGNOSIS — I313 Pericardial effusion (noninflammatory): Secondary | ICD-10-CM | POA: Diagnosis present

## 2014-03-09 DIAGNOSIS — E785 Hyperlipidemia, unspecified: Secondary | ICD-10-CM | POA: Diagnosis present

## 2014-03-09 DIAGNOSIS — I4819 Other persistent atrial fibrillation: Secondary | ICD-10-CM

## 2014-03-09 LAB — CBC
HEMATOCRIT: 40.4 % (ref 39.0–52.0)
Hemoglobin: 13.1 g/dL (ref 13.0–17.0)
MCH: 28.4 pg (ref 26.0–34.0)
MCHC: 32.4 g/dL (ref 30.0–36.0)
MCV: 87.6 fL (ref 78.0–100.0)
Platelets: 224 10*3/uL (ref 150–400)
RBC: 4.61 MIL/uL (ref 4.22–5.81)
RDW: 13.5 % (ref 11.5–15.5)
WBC: 6.3 10*3/uL (ref 4.0–10.5)

## 2014-03-09 LAB — BASIC METABOLIC PANEL
Anion gap: 13 (ref 5–15)
BUN: 27 mg/dL — ABNORMAL HIGH (ref 6–23)
CO2: 25 meq/L (ref 19–32)
Calcium: 9.5 mg/dL (ref 8.4–10.5)
Chloride: 101 mEq/L (ref 96–112)
Creatinine, Ser: 1.1 mg/dL (ref 0.50–1.35)
GFR calc Af Amer: 78 mL/min — ABNORMAL LOW (ref >90)
GFR calc non Af Amer: 68 mL/min — ABNORMAL LOW (ref >90)
GLUCOSE: 120 mg/dL — AB (ref 70–99)
POTASSIUM: 4.3 meq/L (ref 3.7–5.3)
SODIUM: 139 meq/L (ref 137–147)

## 2014-03-09 LAB — MAGNESIUM: Magnesium: 2.4 mg/dL (ref 1.5–2.5)

## 2014-03-09 MED ORDER — DILTIAZEM HCL ER COATED BEADS 240 MG PO CP24
240.0000 mg | ORAL_CAPSULE | Freq: Every day | ORAL | Status: DC
  Administered 2014-03-09 – 2014-03-11 (×3): 240 mg via ORAL
  Filled 2014-03-09 (×4): qty 1

## 2014-03-09 MED ORDER — FINASTERIDE 1 MG PO TABS: 1.0000 mg | ORAL_TABLET | Freq: Every morning | ORAL | Status: DC

## 2014-03-09 MED ORDER — METOPROLOL SUCCINATE ER 100 MG PO TB24
100.0000 mg | ORAL_TABLET | Freq: Two times a day (BID) | ORAL | Status: DC
  Administered 2014-03-09 – 2014-03-12 (×6): 100 mg via ORAL
  Filled 2014-03-09 (×7): qty 1

## 2014-03-09 MED ORDER — DOFETILIDE 500 MCG PO CAPS
500.0000 ug | ORAL_CAPSULE | Freq: Two times a day (BID) | ORAL | Status: DC
  Administered 2014-03-09 – 2014-03-12 (×6): 500 ug via ORAL
  Filled 2014-03-09 (×8): qty 1

## 2014-03-09 MED ORDER — POTASSIUM CHLORIDE CRYS ER 20 MEQ PO TBCR
30.0000 meq | EXTENDED_RELEASE_TABLET | Freq: Every day | ORAL | Status: DC
  Administered 2014-03-10 – 2014-03-12 (×3): 30 meq via ORAL
  Filled 2014-03-09 (×3): qty 1

## 2014-03-09 MED ORDER — VITAMIN D 50 MCG (2000 UT) PO CAPS: 2000.0000 [IU] | ORAL_CAPSULE | Freq: Every day | ORAL | Status: DC

## 2014-03-09 MED ORDER — SODIUM CHLORIDE 0.9 % IJ SOLN
3.0000 mL | Freq: Two times a day (BID) | INTRAMUSCULAR | Status: DC
  Administered 2014-03-09 – 2014-03-12 (×4): 3 mL via INTRAVENOUS

## 2014-03-09 MED ORDER — ATORVASTATIN CALCIUM 40 MG PO TABS
40.0000 mg | ORAL_TABLET | Freq: Every day | ORAL | Status: DC
  Administered 2014-03-09 – 2014-03-11 (×3): 40 mg via ORAL
  Filled 2014-03-09 (×4): qty 1

## 2014-03-09 MED ORDER — VITAMIN D3 25 MCG (1000 UNIT) PO TABS
2000.0000 [IU] | ORAL_TABLET | Freq: Every day | ORAL | Status: DC
  Administered 2014-03-10 – 2014-03-12 (×3): 2000 [IU] via ORAL
  Filled 2014-03-09 (×3): qty 2

## 2014-03-09 MED ORDER — FUROSEMIDE 40 MG PO TABS
40.0000 mg | ORAL_TABLET | Freq: Two times a day (BID) | ORAL | Status: DC
  Administered 2014-03-09 – 2014-03-12 (×6): 40 mg via ORAL
  Filled 2014-03-09 (×8): qty 1

## 2014-03-09 MED ORDER — PANTOPRAZOLE SODIUM 40 MG PO TBEC
40.0000 mg | DELAYED_RELEASE_TABLET | Freq: Every day | ORAL | Status: DC
  Administered 2014-03-09 – 2014-03-11 (×3): 40 mg via ORAL
  Filled 2014-03-09 (×2): qty 1

## 2014-03-09 MED ORDER — SODIUM CHLORIDE 0.9 % IV SOLN
250.0000 mL | INTRAVENOUS | Status: DC | PRN
  Administered 2014-03-11: 09:00:00 via INTRAVENOUS

## 2014-03-09 MED ORDER — DABIGATRAN ETEXILATE MESYLATE 150 MG PO CAPS
150.0000 mg | ORAL_CAPSULE | Freq: Two times a day (BID) | ORAL | Status: DC
  Administered 2014-03-09 – 2014-03-12 (×6): 150 mg via ORAL
  Filled 2014-03-09 (×8): qty 1

## 2014-03-09 MED ORDER — SODIUM CHLORIDE 0.9 % IJ SOLN
3.0000 mL | INTRAMUSCULAR | Status: DC | PRN
  Administered 2014-03-10: 3 mL via INTRAVENOUS
  Filled 2014-03-09: qty 3

## 2014-03-09 NOTE — Progress Notes (Signed)
Notified Lab about stat lab orders.

## 2014-03-09 NOTE — Progress Notes (Signed)
Pt arrived to room 2w29 via wheelchair. Denies needs at this time. No orders at present. Charge Nurse Kim notified and going to call Dr. Rayann Heman.

## 2014-03-09 NOTE — Progress Notes (Signed)
Pharmacy Consult for Dofetilide (Tikosyn) Initiation  Admit Complaint: 68 y.o. male admitted 03/09/2014 with atrial fibrillation to be initiated on dofetilide.   Assessment:  Patient Exclusion Criteria: If any screening criteria checked as "Yes", then  patient  should NOT receive dofetilide until criteria item is corrected. If "Yes" please indicate correction plan.  YES  NO Patient  Exclusion Criteria Correction Plan  []  [x]  Baseline QTc interval is greater than or equal to 440 msec. IF above YES box checked dofetilide contraindicated unless patient has ICD; then may proceed if QTc 500-550 msec or with known ventricular conduction abnormalities may proceed with QTc 550-600 msec. QTc = 427   []  [x]  Magnesium level is less than 1.8 mEq/l : Last magnesium:  Lab Results  Component Value Date   MG 2.4 03/09/2014         []  [x]  Potassium level is less than 4 mEq/l : Last potassium:  Lab Results  Component Value Date   K 4.3 03/09/2014         []  [x]  Patient is known or suspected to have a digoxin level greater than 2 ng/ml: No results found for: DIGOXIN    []  [x]  Creatinine clearance less than 20 ml/min (calculated using Cockcroft-Gault, actual body weight and serum creatinine): Estimated Creatinine Clearance: 75.8 mL/min (by C-G formula based on Cr of 1.1).    []  [x]  Patient has received drugs known to prolong the QT intervals within the last 48 hours(phenothiazines, tricyclics or tetracyclic antidepressants, erythromycin, H-1 antihistamines, cisapride, fluoroquinolones, azithromycin). Drugs not listed above may have an, as yet, undetected potential to prolong the QT interval, updated information on QT prolonging agents is available at this website:QT prolonging agents   []  [x]  Patient received a dose of hydrochlorothiazide (Oretic) alone or in any combination including triamterene (Dyazide, Maxzide) in the last 48 hours.   []  [x]  Patient received a medication known to increase  dofetilide plasma concentrations prior to initial dofetilide dose:  . Trimethoprim (Primsol, Proloprim) in the last 36 hours . Verapamil (Calan, Verelan) in the last 36 hours or a sustained release dose in the last 72 hours . Megestrol (Megace) in the last 5 days  . Cimetidine (Tagamet) in the last 6 hours . Ketoconazole (Nizoral) in the last 24 hours . Itraconazole (Sporanox) in the last 48 hours  . Prochlorperazine (Compazine) in the last 36 hours    []  [x]  Patient is known to have a history of torsades de pointes; congenital or acquired long QT syndromes.   []  [x]  Patient has received a Class 1 antiarrhythmic with less than 2 half-lives since last dose. (Disopyramide, Quinidine, Procainamide, Lidocaine, Mexiletine, Flecainide, Propafenone)   []  [x]  Patient has received amiodarone therapy in the past 3 months or amiodarone level is greater than 0.3 ng/ml.    Patient has been appropriately anticoagulated with dabigatran.  Ordering provider was confirmed at [email or web address] if they are not listed on the [place] Prescribers list.  Goal of Therapy: Follow renal function, electrolytes, potential drug interactions, and dose adjustment. Provide education and 1 week supply at discharge.  Plan:  [x]   Physician selected initial dose within range recommended for patients level of renal function - will monitor for response.  []   Physician selected initial dose outside of range recommended for patients level of renal function - will discuss if the dose should be altered at this time.   Select One Calculated CrCl  Dose q12h  [x]  > 60 ml/min 500 mcg  []  40-60 ml/min  250 mcg  []  20-40 ml/min 125 mcg   2. Follow up QTc after the first 5 doses, renal function, electrolytes (K & Mg) daily x 3     days, dose adjustment, success of initiation and facilitate 1 week discharge supply as     clinically indicated.  3. Initiate Tikosyn education video (Call [phone] and ask for video # 116).  4.  Place Enrollment Form on the chart for discharge supply of dofetilide.   Carisma Troupe, Rande Lawman 9:33 PM 03/09/2014

## 2014-03-09 NOTE — H&P (Signed)
History and Physical   Patient ID: [name] [name] MRN: [id number], DOB/AGE: 11-09-1945 68 y.o. Date of Encounter: 03/09/2014  Primary Physician: [name] Evener, MD Primary Cardiologist: Dr [name] [name]  Chief Complaint:  Atrial fib  HPI: [name] [name] is a 68 y.o. male with a history of paroxysmal atrial fib. He was last cardioverted 01/19/2014, but it only lasted 2 days. When in atrial fib, he feels terrible. He is very tired, not able to do much, also has problems with LE edema.  Since the last cardioversion, he has continued to experience the fatigue. He has also had some mild dyspnea on exertion. There has been no presyncope or syncope. He has had no chest pain. Dr. Rayann [name] reviewed the various medication options with him. The decision was made to start Tikosyn. He is here today for Tikosyn loading, he has no questions or concerns about starting the medication. He is compliant with medications and has not missed any doses of Pradaxa. He is also compliant with C Pap.  Of note, his last echocardiogram was September 2015 and his EF was 60-65 percent with normal diastolic parameters.   Past Medical History  Diagnosis Date  . Paroxysmal atrial fibrillation     a. s/p afib ablations 2002 and 2009 by Dr [name] [name]. b. s/p DCCV 02/2013. c. s/p DCCV 01/19/2014  . H/O cardiac catheterization     no significant CAD by cath 5/12  . Cardiomyopathy     a. 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 b. also hx of tachycardia mediated CM, now resolved  . Hypertension   . Hyperlipidemia   . Thoracic aortic aneurysm     a. Borderline enlarged by CT 12/14 at 4.0cm.  . Depression   . OSA (obstructive sleep apnea)     AHI avg 5/hr  . Limb pain 06/20/2007    LE venous doppler - no evidence of thrombus or thrombophlebitis  . PVC (premature ventricular contraction) 12/18/2009    cardionet monitor 21 days - some PVCs, ventricular bigeminy  . Pericardial effusion     a. Since 2007. b. s/p  pericardial window 04/2013.  Marland Kitchen Echocardiogram abnormal     EF60%, mild LVH, PA pk pressure 35, pericardial effusion mild increase from 2012   . Complication of anesthesia     Pt has anxiety issues and thinks he is going to die.  . Liver mass     a. CT 03/2013: Small probable cysts in the liver and small hyperenhancing liver mass.    Surgical History:  Past Surgical History  Procedure Laterality Date  . Cardiac catheterization  08/25/10    NO SIGNIFICANT CAD  . Cardioversion  10/18/2007    successful  . Elbow / upper arm foreign body removal    . Incision and drainage of wound  09/07/2011    Procedure: IRRIGATION AND DEBRIDEMENT WOUND;  Surgeon: [name] Salen, MD;  Location: [name];  Service: Orthopedics;  Laterality: Left;  . Transesophageal echocardiogram  08/26/2010    see Notes tab  . Cardiovascular stress test  09/08/2005    R/S MV - EF 61%; Exercises capacity 12 Mets; essentially normal perfusion myocardial scan    . Cardiac electrophysiology mapping and ablation  [id number]    Dr. Tally [name]  . Osteosynthesis Left 09/30/2012    with small plate in the ulna  . Cardioversion N/A 02/08/2013    Procedure: CARDIOVERSION;  Surgeon: [name] Sine, MD;  Location: [place];  Service: Cardiovascular;  Laterality: N/A;  . Fracture surgery Left     Left wrist  . Subxyphoid pericardial window N/A 04/25/2013    Procedure: SUBXYPHOID PERICARDIAL WINDOW;  Surgeon: [name] Pollack, MD;  Location: [place] [facility];  Service: Thoracic;  Laterality: N/A;  . Carpal tunnel release Left April 2015  . Cardioversion N/A 08/17/2013    Procedure: CARDIOVERSION;  Surgeon: [name] Klein, MD;  Location: [place];  Service: Cardiovascular;  Laterality: N/A;  . Cardioversion N/A 12/28/2013    Procedure: CARDIOVERSION;  Surgeon: [name] Records, MD;  Location: [place];  Service: Cardiovascular;  Laterality: N/A;  . Cardioversion N/A 01/19/2014    Procedure: CARDIOVERSION;  Surgeon: [name] Klein, MD;  Location: [facility]  ENDOSCOPY;  Service: Cardiovascular;  Laterality: N/A;     I have reviewed the patient's current medications. Prior to Admission medications   Medication Sig Start Date End Date Taking? Authorizing Provider  atorvastatin (LIPITOR) 40 MG tablet Take 1 tablet (40 mg total) by mouth daily. 09/27/13  Yes [name] Grayer, MD  Cholecalciferol (VITAMIN D) 2000 UNITS CAPS Take 2,000 Units by mouth daily.   Yes Historical Provider, MD  dabigatran (PRADAXA) 150 MG CAPS capsule Take 1 capsule (150 mg total) by mouth 2 (two) times daily. 09/27/13  Yes [name] Grayer, MD  diltiazem (CARDIZEM CD) 240 MG 24 hr capsule Take 1 capsule (240 mg total) by mouth daily. 09/27/13  Yes [name] Grayer, MD  finasteride (PROPECIA) 1 MG tablet Take 1 mg by mouth every morning.    Yes Historical Provider, MD  furosemide (LASIX) 40 MG tablet Take 1 tablet (40 mg total) by mouth 2 (two) times daily. 09/27/13  Yes [name] Grayer, MD  metoprolol succinate (TOPROL-XL) 100 MG 24 hr tablet Take 1 tablet (100 mg total) by mouth 2 (two) times daily. Take with or immediately following a meal. 09/27/13  Yes [name] Grayer, MD  pantoprazole (PROTONIX) 40 MG tablet Take 1 tablet (40 mg total) by mouth at bedtime. 10/02/13  Yes [name] Serge, NP  potassium chloride SA (K-DUR,KLOR-CON) 20 MEQ tablet Take 1 1/2 tablets (30mg ) in the morning   Yes Historical Provider, MD  Saw Palmetto, Serenoa repens, (SAW PALMETTO PO) Take 900 mg by mouth 2 (two) times daily.    Yes Historical Provider, MD   Allergies:  Allergies  Allergen Reactions  . Tetracyclines & Related Hives    History   Social History  . Marital Status: Single    Spouse Name: N/A    Number of Children: 0  . Years of Education: N/A   Occupational History  .  [place] History Main Topics  . Smoking status: Former Smoker -- 1.00 packs/day for 10 years    Quit date: 02/06/1989  . Smokeless tobacco: Never Used  . Alcohol Use: Yes     Comment: 2 bottles of wine per  week  . Drug Use: No  . Sexual Activity: Not on file   Other Topics Concern  . 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    Family History  Problem Relation Age of Onset  . Coronary artery disease Father   . Stroke Mother     died  . Atrial fibrillation Brother   . Heart attack Father   . Mitral  valve prolapse Sister    Family Status  Relation Status Death Age  . Father Deceased   . Mother Deceased   . Brother Alive   . Sister Alive     Review of Systems:   Full 14-point review of systems otherwise negative except as noted above.  Physical Exam: Blood pressure 113/74, pulse 83, temperature 98.7 F (37.1 C), temperature source Oral, resp. rate 18, height 6\' 2"  (1.88 m), weight 212 lb 4.9 oz (96.3 kg), SpO2 100 %. General: Well developed, well nourished,male in no acute distress. Head: Normocephalic, atraumatic, sclera non-icteric, no xanthomas, nares are without discharge. Dentition: Good Neck: No carotid bruits. JVD not elevated. No thyromegally Lungs: Good expansion bilaterally. without wheezes or rhonchi.  Heart: Rapid and IRRegular rate and rhythm with S1 S2.  No S3 or S4.  No murmur, no rubs, or gallops appreciated. Abdomen: Soft, non-tender, non-distended with normoactive bowel sounds. No hepatomegaly. No rebound/guarding. No obvious abdominal masses. Msk:  Strength and tone appear normal for age. No joint deformities or effusions, no spine or costo-vertebral angle tenderness. Extremities: No clubbing or cyanosis. Trace edema.  Distal pedal pulses are 2+ in 4 extrem Neuro: Alert and oriented X 3. Moves all extremities spontaneously. No focal deficits noted. Psych:  Responds to questions appropriately with a normal affect. Skin: No rashes or lesions noted  Labs:   Pending  ECG: Pending  ASSESSMENT AND PLAN:  Principal Problem:   Atrial fibrillation with rapid ventricular response - he is here today for Tikosyn loading. Check ECG, check labs and start Tikosyn this PM. Consider DC CV after loading if the patient does not revert back to NSR on Tikosyn.   Otherwise, continue current medications. Active Problems:   Essential hypertension, benign   Chronic anticoagulation, pradaxa   Jonetta Speak, PA-C 03/09/2014 5:39 PM Beeper [phone]  EP Attending  Patient seen and examined. Agree with above history, exam, assessment and plan. Will admit for Tikosyn and follow ECG and potassium level and renal function.  Hopefully he will revert back to NSR but if not, he will undergo DCCV  Cristopher Peru, M.D.

## 2014-03-09 NOTE — Progress Notes (Signed)
HPI  The patient presents today for Tikosyn admission.  He is a pt of Dr. Rayann Heman. He has a longstanding history of atrial fibrillation.  He has undergone 2 ablations in the past and multiple cardioversion (pt reports more than 50).  He has also tried several antiarrhythmics in the past including Tikosyn and flecainide.  He was seen by Dr. Rayann Heman on 10/21 after having a DCCV on 12/16.  Pt was back in atrial fibrillation so plan was to proceed with Tikosyn admission in November.  Unfortunately he missed a dose of Pradaxa during that time and so we had to wait another 3 weeks before starting Tikosyn.    Discussed potential side effects with patient including QTc prolongation.  He is aware of the importance of compliance and to call the office if he misses more than 2 doses in a row.  He is currently not taking any QTc prolongating or contraindicated medications.  He has not missed any doses of Pradaxa in the past month.  He has held his furosemide for the past 2 days as instructed to help ensure his potassium would be >4 today.  Pt does a lot of traveling and does request a 90 day prescription when he leaves the hospital.   EKG reviewed by Dr. Lovena Le today.  Atrial fibrillation with vent rate of 93 bpm.  QTc 427 msec.    Current Outpatient Prescriptions  Medication Sig Dispense Refill  . atorvastatin (LIPITOR) 40 MG tablet Take 1 tablet (40 mg total) by mouth daily. 90 tablet 3  . Cholecalciferol (VITAMIN D) 2000 UNITS CAPS Take 2,000 Units by mouth daily.    . dabigatran (PRADAXA) 150 MG CAPS capsule Take 1 capsule (150 mg total) by mouth 2 (two) times daily. 180 capsule 3  . diltiazem (CARDIZEM CD) 240 MG 24 hr capsule Take 1 capsule (240 mg total) by mouth daily. 90 capsule 3  . finasteride (PROPECIA) 1 MG tablet Take 1 mg by mouth every morning.     . furosemide (LASIX) 40 MG tablet Take 1 tablet (40 mg total) by mouth 2 (two) times daily. 180 tablet 3  . metoprolol succinate (TOPROL-XL) 100 MG 24  hr tablet Take 1 tablet (100 mg total) by mouth 2 (two) times daily. Take with or immediately following a meal. 180 tablet 3  . pantoprazole (PROTONIX) 40 MG tablet Take 1 tablet (40 mg total) by mouth at bedtime. 90 tablet 3  . potassium chloride SA (K-DUR,KLOR-CON) 20 MEQ tablet Take 1 1/2 tablets (30mg ) in the morning    . Saw Palmetto, Serenoa repens, (SAW PALMETTO PO) Take 900 mg by mouth 2 (two) times daily.      No current facility-administered medications for this visit.    Allergies  Allergen Reactions  . Tetracyclines & Related Hives   Assessment and Plan  1.  Atrial Fibrillation: Labs unavailable this afternoon due to computer issues.  Will have pt go ahead for admission to the hospital due to his travel schedule in hopes that they are available by the time he is in his room.

## 2014-03-10 ENCOUNTER — Other Ambulatory Visit: Payer: Self-pay

## 2014-03-10 ENCOUNTER — Encounter (HOSPITAL_COMMUNITY): Payer: Self-pay

## 2014-03-10 ENCOUNTER — Encounter: Payer: Self-pay | Admitting: Internal Medicine

## 2014-03-10 ENCOUNTER — Encounter: Payer: Self-pay | Admitting: Pharmacist

## 2014-03-10 LAB — BASIC METABOLIC PANEL
ANION GAP: 14 (ref 5–15)
BUN: 23 mg/dL (ref 6–23)
CALCIUM: 9.4 mg/dL (ref 8.4–10.5)
CHLORIDE: 100 meq/L (ref 96–112)
CO2: 25 mEq/L (ref 19–32)
CREATININE: 1.08 mg/dL (ref 0.50–1.35)
GFR calc Af Amer: 80 mL/min — ABNORMAL LOW (ref >90)
GFR, EST NON AFRICAN AMERICAN: 69 mL/min — AB (ref >90)
Glucose, Bld: 93 mg/dL (ref 70–99)
Potassium: 4 mEq/L (ref 3.7–5.3)
Sodium: 139 mEq/L (ref 137–147)

## 2014-03-10 LAB — MAGNESIUM: MAGNESIUM: 2.3 mg/dL (ref 1.5–2.5)

## 2014-03-10 NOTE — Progress Notes (Signed)
Patient ID: DOMIQUE CLAPPER, male   DOB: Jun 10, 1945, 68 y.o.   MRN: [id number]    Patient Name: [name] [name] Date of Encounter: 03/10/2014     Principal Problem:   Atrial fibrillation with rapid ventricular response Active Problems:   Essential hypertension, benign   Chronic anticoagulation, pradaxa   Atrial fibrillation, rapid    SUBJECTIVE  Admit for tikosyn initiation, no chest pain or sob.  CURRENT MEDS . atorvastatin  40 mg Oral Daily  . cholecalciferol  2,000 Units Oral Daily  . dabigatran  150 mg Oral BID  . diltiazem  240 mg Oral Daily  . dofetilide  500 mcg Oral BID  . furosemide  40 mg Oral BID  . metoprolol succinate  100 mg Oral BID  . pantoprazole  40 mg Oral QHS  . potassium chloride SA  30 mEq Oral Daily  . sodium chloride  3 mL Intravenous Q12H    OBJECTIVE  Filed Vitals:   03/09/14 1504 03/09/14 2042 03/10/14 0434  BP: 113/74 96/63 112/75  Pulse: 83 101 81  Temp: 98.7 F (37.1 C) 98.2 F (36.8 C) 97.6 F (36.4 C)  TempSrc: Oral Oral Oral  Resp: 18 19 20   Height: 6\' 2"  (1.88 m)    Weight: 212 lb 4.9 oz (96.3 kg)    SpO2: 100% 96% 98%    Intake/Output Summary (Last 24 hours) at 03/10/14 0706 Last data filed at 03/10/14 0444  Gross per 24 hour  Intake      0 ml  Output   1400 ml  Net  -1400 ml   Filed Weights   03/09/14 1504  Weight: 212 lb 4.9 oz (96.3 kg)    PHYSICAL EXAM  General: Pleasant, NAD. Neuro: Alert and oriented X 3. Moves all extremities spontaneously. Psych: Normal affect. HEENT:  Normal  Neck: Supple without bruits or JVD. Lungs:  Resp regular and unlabored, CTA. Heart: IRRR no s3, s4, or murmurs. Abdomen: Soft, non-tender, non-distended, BS + x 4.  Extremities: No clubbing, cyanosis or edema. DP/PT/Radials 2+ and equal bilaterally.  Accessory Clinical Findings  CBC  Recent Labs  03/09/14 1950  WBC 6.3  HGB 13.1  HCT 40.4  MCV 87.6  PLT 465   Basic Metabolic Panel  Recent Labs  03/09/14 1950  03/10/14 0427  NA 139 139  K 4.3 4.0  CL 101 100  CO2 25 25  GLUCOSE 120* 93  BUN 27* 23  CREATININE 1.10 1.08  CALCIUM 9.5 9.4  MG 2.4 2.3   Liver Function Tests No results for input(s): AST, ALT, ALKPHOS, BILITOT, PROT, ALBUMIN in the last 72 hours. No results for input(s): LIPASE, AMYLASE in the last 72 hours. Cardiac Enzymes No results for input(s): CKTOTAL, CKMB, CKMBINDEX, TROPONINI in the last 72 hours. BNP Invalid input(s): POCBNP D-Dimer No results for input(s): DDIMER in the last 72 hours. Hemoglobin A1C No results for input(s): HGBA1C in the last 72 hours. Fasting Lipid Panel No results for input(s): CHOL, HDL, LDLCALC, TRIG, CHOLHDL, LDLDIRECT in the last 72 hours. Thyroid Function Tests No results for input(s): TSH, T4TOTAL, T3FREE, THYROIDAB in the last 72 hours.  Invalid input(s): FREET3  TELE Atrial fib with a CVR  Radiology/Studies  No results found.  ASSESSMENT AND PLAN  1. Atrial fib with a controlled Vr 2. Admit for Tikosyn initiation Plan - continue Tikosyn, follow ECG and electrolytes and renal function. DCCV on Monday if not back to NSR.  Janah Mcculloh,M.D.  03/10/2014 7:06 AM

## 2014-03-10 NOTE — Plan of Care (Signed)
Problem: Phase I Progression Outcomes Goal: Anticoagulation Therapy per MD order Outcome: Completed/Met Date Met:  03/10/14 Goal: Heart rate or rhythm control medication Outcome: Completed/Met Date Met:  03/10/14 Goal: Pain controlled with appropriate interventions Outcome: Completed/Met Date Met:  03/10/14 Goal: Initial discharge plan identified Outcome: Completed/Met Date Met:  03/10/14 Goal: Hemodynamically stable Outcome: Completed/Met Date Met:  03/10/14 Goal: Other Phase I Outcomes/Goals Outcome: Completed/Met Date Met:  03/10/14

## 2014-03-11 ENCOUNTER — Inpatient Hospital Stay (HOSPITAL_COMMUNITY): Payer: Medicare Other | Admitting: Anesthesiology

## 2014-03-11 ENCOUNTER — Encounter (HOSPITAL_COMMUNITY)
Admission: AD | Disposition: A | Discharge status: Home / Self Care | Payer: Self-pay | Source: Ambulatory Visit | Attending: Internal Medicine

## 2014-03-11 HISTORY — PX: CARDIOVERSION: SHX1299

## 2014-03-11 LAB — BASIC METABOLIC PANEL
Anion gap: 12 (ref 5–15)
BUN: 28 mg/dL — ABNORMAL HIGH (ref 6–23)
BUN: 20 mg/dL (ref 6–23)
CALCIUM: 9.5 mg/dL (ref 8.4–10.5)
CHLORIDE: 104 meq/L (ref 96–112)
CO2: 25 meq/L (ref 19–32)
CO2: 26 meq/L (ref 19–32)
CREATININE: 1.05 mg/dL (ref 0.50–1.35)
Calcium: 9.5 mg/dL (ref 8.4–10.5)
Chloride: 101 mEq/L (ref 96–112)
Creatinine, Ser: 1.1 mg/dL (ref 0.4–1.5)
GFR calc Af Amer: 83 mL/min — ABNORMAL LOW (ref >90)
GFR, EST NON AFRICAN AMERICAN: 71 mL/min — AB (ref >90)
GFR: 73.95 mL/min (ref >60.00)
Glucose, Bld: 88 mg/dL (ref 70–99)
Glucose, Bld: 108 mg/dL — ABNORMAL HIGH (ref 70–99)
POTASSIUM: 4.5 meq/L (ref 3.5–5.1)
Potassium: 3.9 mEq/L (ref 3.7–5.3)
SODIUM: 139 meq/L (ref 137–147)
Sodium: 139 mEq/L (ref 135–145)

## 2014-03-11 LAB — MAGNESIUM
MAGNESIUM: 2.3 mg/dL (ref 1.5–2.5)
Magnesium: 2.3 mg/dL (ref 1.5–2.5)

## 2014-03-11 SURGERY — CARDIOVERSION
Anesthesia: Choice

## 2014-03-11 MED ORDER — LIDOCAINE HCL (CARDIAC) 20 MG/ML IV SOLN
INTRAVENOUS | Status: DC | PRN
  Administered 2014-03-11: 90 mg via INTRAVENOUS

## 2014-03-11 MED ORDER — PROPOFOL 10 MG/ML IV BOLUS
INTRAVENOUS | Status: DC | PRN
  Administered 2014-03-11: 120 mg via INTRAVENOUS

## 2014-03-11 NOTE — Progress Notes (Addendum)
Post procedure EKG completed.  Sharyn Lull, RN

## 2014-03-11 NOTE — Anesthesia Preprocedure Evaluation (Addendum)
Anesthesia Evaluation  Patient identified by MRN, date of birth, ID band Patient awake    Reviewed: Allergy & Precautions, H&P , NPO status , Patient's Chart, lab work & pertinent test results  History of Anesthesia Complications (+) history of anesthetic complications (Patient states he has a "huge" anxiety being masked)  Airway Mallampati: II  TM Distance: >3 FB Neck ROM: Full    Dental  (+) Teeth Intact, Dental Advisory Given   Pulmonary sleep apnea and Continuous Positive Airway Pressure Ventilation , former smoker,  breath sounds clear to auscultation        Cardiovascular hypertension, Pt. on medications and Pt. on home beta blockers + Peripheral Vascular Disease Rhythm:Irregular Rate:Normal     Neuro/Psych PSYCHIATRIC DISORDERS Anxiety negative neurological ROS     GI/Hepatic negative GI ROS, Neg liver ROS,   Endo/Other  negative endocrine ROS  Renal/GU negative Renal ROS     Musculoskeletal   Abdominal   Peds  Hematology   Anesthesia Other Findings   Reproductive/Obstetrics negative OB ROS                          Anesthesia Physical Anesthesia Plan  ASA: III  Anesthesia Plan: General   Post-op Pain Management:    Induction: Intravenous  Airway Management Planned: Natural Airway and Mask  Additional Equipment:   Intra-op Plan:   Post-operative Plan: Extubation in OR  Informed Consent: I have reviewed the patients History and Physical, chart, labs and discussed the procedure including the risks, benefits and alternatives for the proposed anesthesia with the patient or authorized representative who has indicated his/her understanding and acceptance.   Dental advisory given  Plan Discussed with: CRNA and Surgeon  Anesthesia Plan Comments:         Anesthesia Quick Evaluation

## 2014-03-11 NOTE — Progress Notes (Signed)
Patient ID: [name] [name], male   DOB: Aug 24, 1945, 68 y.o.   MRN: [id number]  EP Procedure Note  Procedure: DCCV  Indication: symptomatic atrial fibrillation  Description of the Procedure: after informed consent was obtained, the patient was prepped in the usual fashion. The patient was sedated with IV propofol under the direction of Dr. Orene Desanctis. He was cardioverted with 200 J of synchronized biphasic energy via the electrodispersive pad in the AP position, restoring NSR. An ecg was ordered.   Complications: none immediately  Conclusion: successful DCCV in a patient with persistent atrial fibrillation.  Cristopher Peru, M.D.

## 2014-03-11 NOTE — Anesthesia Postprocedure Evaluation (Signed)
  Anesthesia Post-op Note  Patient: [name] [name]  Procedure(s) Performed: Procedure(s): CARDIOVERSION (N/A)  Patient Location: Nursing Unit  Anesthesia Type:MAC  Level of Consciousness: awake, alert , oriented and patient cooperative  Airway and Oxygen Therapy: Patient Spontanous Breathing and Patient connected to nasal cannula oxygen  Post-op Pain: none  Post-op Assessment: Post-op Vital signs reviewed, Patient's Cardiovascular Status Stable, Respiratory Function Stable, Patent Airway and No signs of Nausea or vomiting  Post-op Vital Signs: Reviewed  Last Vitals:  Filed Vitals:   03/11/14 0949  BP: 106/78  Pulse: 65  Temp:   Resp: 18    Complications: No apparent anesthesia complications

## 2014-03-11 NOTE — Progress Notes (Signed)
Patient ID: [name] [name], male   DOB: Jul 24, 1945, 68 y.o.   MRN: [id number]    Patient Name: [name] [name] Date of Encounter: 03/11/2014     Principal Problem:   Atrial fibrillation with rapid ventricular response Active Problems:   Essential hypertension, benign   Chronic anticoagulation, pradaxa   Atrial fibrillation, rapid    SUBJECTIVE  Remains in atrial fibrillation, no chest pain or sob.  CURRENT MEDS . atorvastatin  40 mg Oral Daily  . cholecalciferol  2,000 Units Oral Daily  . dabigatran  150 mg Oral BID  . diltiazem  240 mg Oral Daily  . dofetilide  500 mcg Oral BID  . furosemide  40 mg Oral BID  . metoprolol succinate  100 mg Oral BID  . pantoprazole  40 mg Oral QHS  . potassium chloride SA  30 mEq Oral Daily  . sodium chloride  3 mL Intravenous Q12H    OBJECTIVE  Filed Vitals:   03/10/14 0434 03/10/14 1452 03/10/14 1941 03/11/14 0500  BP: 112/75 97/71 109/70 116/82  Pulse: 81 96 99 118  Temp: 97.6 F (36.4 C) 98 F (36.7 C) 98.4 F (36.9 C) 97.5 F (36.4 C)  TempSrc: Oral Oral Oral Oral  Resp: 20 18 18 18   Height:      Weight:      SpO2: 98% 100% 99% 100%    Intake/Output Summary (Last 24 hours) at 03/11/14 0721 Last data filed at 03/10/14 1800  Gross per 24 hour  Intake    720 ml  Output   1100 ml  Net   -380 ml   Filed Weights   03/09/14 1504  Weight: 212 lb 4.9 oz (96.3 kg)    PHYSICAL EXAM  General: Pleasant, NAD. Neuro: Alert and oriented X 3. Moves all extremities spontaneously. Psych: Normal affect. HEENT:  Normal  Neck: Supple without bruits or JVD. Lungs:  Resp regular and unlabored, CTA. Heart: IRRR no s3, s4, or murmurs. Abdomen: Soft, non-tender, non-distended, BS + x 4.  Extremities: No clubbing, cyanosis or edema. DP/PT/Radials 2+ and equal bilaterally.  Accessory Clinical Findings  CBC  Recent Labs  03/09/14 1950  WBC 6.3  HGB 13.1  HCT 40.4  MCV 87.6  PLT 147   Basic Metabolic Panel  Recent Labs  03/10/14 0427 03/11/14 0521  NA 139 139  K 4.0 3.9  CL 100 101  CO2 25 26  GLUCOSE 93 108*  BUN 23 20  CREATININE 1.08 1.05  CALCIUM 9.4 9.5  MG 2.3 2.3   Liver Function Tests No results for input(s): AST, ALT, ALKPHOS, BILITOT, PROT, ALBUMIN in the last 72 hours. No results for input(s): LIPASE, AMYLASE in the last 72 hours. Cardiac Enzymes No results for input(s): CKTOTAL, CKMB, CKMBINDEX, TROPONINI in the last 72 hours. BNP Invalid input(s): POCBNP D-Dimer No results for input(s): DDIMER in the last 72 hours. Hemoglobin A1C No results for input(s): HGBA1C in the last 72 hours. Fasting Lipid Panel No results for input(s): CHOL, HDL, LDLCALC, TRIG, CHOLHDL, LDLDIRECT in the last 72 hours. Thyroid Function Tests No results for input(s): TSH, T4TOTAL, T3FREE, THYROIDAB in the last 72 hours.  Invalid input(s): FREET3  TELE Atrial fib with a CVR  ECG - atrial fib with an RVR. I have reviewed his QT and his corrected was 440.  Radiology/Studies  No results found.  ASSESSMENT AND PLAN  1. Atrial fib with a controlled Vr 2. Admit for Tikosyn initiation, still in atrial fib on Tikosyn  but QT is ok Plan - continue Tikosyn, follow ECG and electrolytes and renal function. DCCV this morning unless he reverts to NSR on next dose.  Hattie Pine,M.D.  03/11/2014 7:21 AM

## 2014-03-11 NOTE — Plan of Care (Signed)
Problem: Phase II Progression Outcomes Goal: Ventricular heart rate < 100/min Outcome: Progressing

## 2014-03-11 NOTE — Anesthesia Procedure Notes (Signed)
Procedure Name: MAC Date/Time: 03/11/2014 9:12 AM Performed by: Jenne Campus Pre-anesthesia Checklist: Patient identified, Emergency Drugs available, Suction available, Patient being monitored and Timeout performed Patient Re-evaluated:Patient Re-evaluated prior to inductionOxygen Delivery Method: Ambu bag

## 2014-03-11 NOTE — Transfer of Care (Signed)
Immediate Anesthesia Transfer of Care Note  Patient: [name] [name]  Procedure(s) Performed: Procedure(s): CARDIOVERSION (N/A)  Patient Location: Nursing Unit  Anesthesia Type:MAC  Level of Consciousness: awake, alert  and patient cooperative  Airway & Oxygen Therapy: Patient Spontanous Breathing and Patient connected to nasal cannula oxygen  Post-op Assessment: Report given to PACU RN and Post -op Vital signs reviewed and stable  Post vital signs: Reviewed  Complications: No apparent anesthesia complications

## 2014-03-12 ENCOUNTER — Other Ambulatory Visit: Payer: Self-pay | Admitting: *Deleted

## 2014-03-12 ENCOUNTER — Telehealth: Payer: Self-pay | Admitting: Internal Medicine

## 2014-03-12 ENCOUNTER — Encounter (HOSPITAL_COMMUNITY): Payer: Self-pay | Admitting: Internal Medicine

## 2014-03-12 ENCOUNTER — Other Ambulatory Visit: Payer: Self-pay | Admitting: Pharmacist

## 2014-03-12 DIAGNOSIS — I481 Persistent atrial fibrillation: Secondary | ICD-10-CM

## 2014-03-12 LAB — BASIC METABOLIC PANEL
Anion gap: 16 — ABNORMAL HIGH (ref 5–15)
BUN: 23 mg/dL (ref 6–23)
CO2: 26 meq/L (ref 19–32)
Calcium: 9.6 mg/dL (ref 8.4–10.5)
Chloride: 98 mEq/L (ref 96–112)
Creatinine, Ser: 1.07 mg/dL (ref 0.50–1.35)
GFR calc Af Amer: 81 mL/min — ABNORMAL LOW (ref >90)
GFR calc non Af Amer: 70 mL/min — ABNORMAL LOW (ref >90)
GLUCOSE: 105 mg/dL — AB (ref 70–99)
POTASSIUM: 4.7 meq/L (ref 3.7–5.3)
Sodium: 140 mEq/L (ref 137–147)

## 2014-03-12 LAB — MAGNESIUM: Magnesium: 2.3 mg/dL (ref 1.5–2.5)

## 2014-03-12 MED ORDER — DOFETILIDE 500 MCG PO CAPS: 500.0000 ug | ORAL_CAPSULE | Freq: Two times a day (BID) | ORAL | Status: DC

## 2014-03-12 NOTE — Discharge Summary (Signed)
ELECTROPHYSIOLOGY PROCEDURE DISCHARGE SUMMARY    Patient ID: [name] [name],  MRN: [id number], DOB/AGE: 1946-04-04 68 y.o.  Admit date: 03/09/2014 Discharge date: 03/12/2014  Primary Care Physician: [name], MD Primary Cardiologist: Claiborne Billings Electrophysiologist: Allred  Primary Discharge Diagnosis:  1.  Persistent atrial fibrillation status post Tikosyn loading this admission  Secondary Discharge Diagnosis:  1.  History of viral cardiomyopathy - resolved 2.  Hypertension 3.  Hyperlipidemia 4.  Depression 5.  Sleep apnea - on CPAP 6.  Pericardial effusion s/p window 04/2013   Allergies  Allergen Reactions  . Tetracyclines & Related Hives     Procedures This Admission:  1.  Tikosyn loading 2.  Direct current cardioversion on 03/11/2014 by Dr Lovena Le which successfully restored SR.  There were no early apparent complications.   Brief HPI: [name] [name] is a 68 y.o. male with a past medical history as noted above.  He is s/p prior PVI at [facility] but has had recurrent atrial fibrillation. Risks, benefits, and alternatives to Tikosyn were reviewed with the patient who wished to proceed.    Hospital Course:  The patient was admitted and Tikosyn was initiated.  Renal function and electrolytes were followed during the hospitalization.  Their QTc remained stable.  On 03/11/2014 they underwent direct current cardioversion which restored sinus rhythm.  They were monitored until discharge on telemetry which demonstrated sinus rhythm.  On the day of discharge, they were examined by Dr Lovena Le who considered them stable for discharge to home.  Follow-up has been arranged with [facility] pharmacists in 1 week, Roderic Palau, NP in 4 weeks, and Dr Rayann Heman in 3 months. His QTc was stable this AM and he will be discharged home on all of his same medications. A 1 week supply for Tikosyn was written for him to fill before leaving the hospital.   Discharge Vitals: Blood pressure 111/73, pulse 58,  temperature 97.8 F (36.6 C), temperature source Oral, resp. rate 18, height 6\' 2"  (1.88 m), weight 212 lb 4.9 oz (96.3 kg), SpO2 99 %.   Labs:   Lab Results  Component Value Date   WBC 6.3 03/09/2014   HGB 13.1 03/09/2014   HCT 40.4 03/09/2014   MCV 87.6 03/09/2014   PLT 224 03/09/2014     Recent Labs Lab 03/12/14 0335  NA 140  K 4.7  CL 98  CO2 26  BUN 23  CREATININE 1.07  CALCIUM 9.6  GLUCOSE 105*     Discharge Medications:    Medication List    TAKE these medications        atorvastatin 40 MG tablet  Commonly known as:  LIPITOR  Take 1 tablet (40 mg total) by mouth daily.     dabigatran 150 MG Caps capsule  Commonly known as:  PRADAXA  Take 1 capsule (150 mg total) by mouth 2 (two) times daily.     diltiazem 240 MG 24 hr capsule  Commonly known as:  CARDIZEM CD  Take 1 capsule (240 mg total) by mouth daily.     dofetilide 500 MCG capsule  Commonly known as:  TIKOSYN  Take 1 capsule (500 mcg total) by mouth 2 (two) times daily.     finasteride 1 MG tablet  Commonly known as:  PROPECIA  Take 1 mg by mouth every morning.     furosemide 40 MG tablet  Commonly known as:  LASIX  Take 1 tablet (40 mg total) by mouth 2 (two) times daily.  metoprolol succinate 100 MG 24 hr tablet  Commonly known as:  TOPROL-XL  Take 1 tablet (100 mg total) by mouth 2 (two) times daily. Take with or immediately following a meal.     pantoprazole 40 MG tablet  Commonly known as:  PROTONIX  Take 1 tablet (40 mg total) by mouth at bedtime.     potassium chloride SA 20 MEQ tablet  Commonly known as:  K-DUR,KLOR-CON  Take 1 1/2 tablets (30mg ) in the morning     SAW PALMETTO PO  Take 900 mg by mouth 2 (two) times daily.     Vitamin D 2000 UNITS Caps  Take 2,000 Units by mouth daily.        Disposition:   Follow-up Information    Follow up with CARROLL,DONNA, NP On 04/10/2014.   Specialty:  Nurse Practitioner   Why:  @ 3:30pm   Contact information:   [place] [address] [phone]       Follow up with Suyash Amory Grayer, MD On 06/11/2014.   Specialty:  Cardiology   Why:  @ 9:45am   Contact information:   [place] [address] [phone]       Duration of Discharge Encounter: Greater than 30 minutes including physician time.  Signed,

## 2014-03-12 NOTE — Progress Notes (Signed)
Patient d/c this afternoon, d/c instructions given and all questions answered. Assessments remains unchanged prior to d/c.

## 2014-03-12 NOTE — Telephone Encounter (Signed)
New message        Pt would like to have prescription for tikosyn sent in for 3 month supply

## 2014-03-12 NOTE — Care Management Note (Signed)
    Page 1 of 1   03/12/2014     2:11:25 PM CARE MANAGEMENT NOTE 03/12/2014  Patient:  [name] [name]   Account Number:  [id number]  Date Initiated:  03/12/2014  Documentation initiated by:  Marvetta Gibbons  Subjective/Objective Assessment:   Pt admitted for Tikosyn load/ afib     Action/Plan:   PTA pt lived at home   Anticipated DC Date:  03/12/2014   Anticipated DC Plan:  [place]  CM consult  Medication Assistance      Choice offered to / List presented to:             Status of service:  Completed, signed off Medicare Important Message given?  YES (If response is "NO", the following Medicare IM given date fields will be blank) Date Medicare IM given:  03/12/2014 Medicare IM given by:  Marvetta Gibbons Date Additional Medicare IM given:   Additional Medicare IM given by:    Discharge Disposition:  HOME/SELF CARE  Per UR Regulation:  Reviewed for med. necessity/level of care/duration of stay  If discussed at [place] of Stay Meetings, dates discussed:    Comments:  03/12/14- 1200- Marvetta Gibbons RN, BSN [phone] Referral for Tikosyn needs- benefits check requested- pt copay will be 25% of the drug cost- prior auth is not required-- in to speak with pt at bedside- per pt his tier 5 drugs are normally $50 copays- [facility] [address]- [facility] there- however this [facility] is not in the [place] program and will not be able to order drug- next closest [facility]- this location is in the [place] program - call made to this location which has [name] bottle of 500 mg in stock and states that usually it takes 4 days to drop ship once they have script in hand. Per pt he is to leave to go on business trip this friday the 11th through the 20th. Have asked the PA with cardiology to please send [facility] on [address]- pt plans to go there to pick up- have given pt 7 day supply through [facility].

## 2014-03-12 NOTE — Telephone Encounter (Signed)
Rx sent earlier [facility] [address]

## 2014-03-15 ENCOUNTER — Encounter (HOSPITAL_COMMUNITY): Payer: Self-pay | Admitting: Cardiovascular Disease

## 2014-03-19 ENCOUNTER — Ambulatory Visit: Payer: Medicare Other | Admitting: Pharmacist

## 2014-03-26 ENCOUNTER — Other Ambulatory Visit: Payer: Self-pay | Admitting: Cardiovascular Disease

## 2014-03-26 IMAGING — CR DG CHEST 2V
2 series · 2 of 2 positions shown · non-contrast
Comparison: 08/22/2010

CLINICAL DATA: Short of breath

EXAM:
CHEST  2 VIEW

[w chest pa]
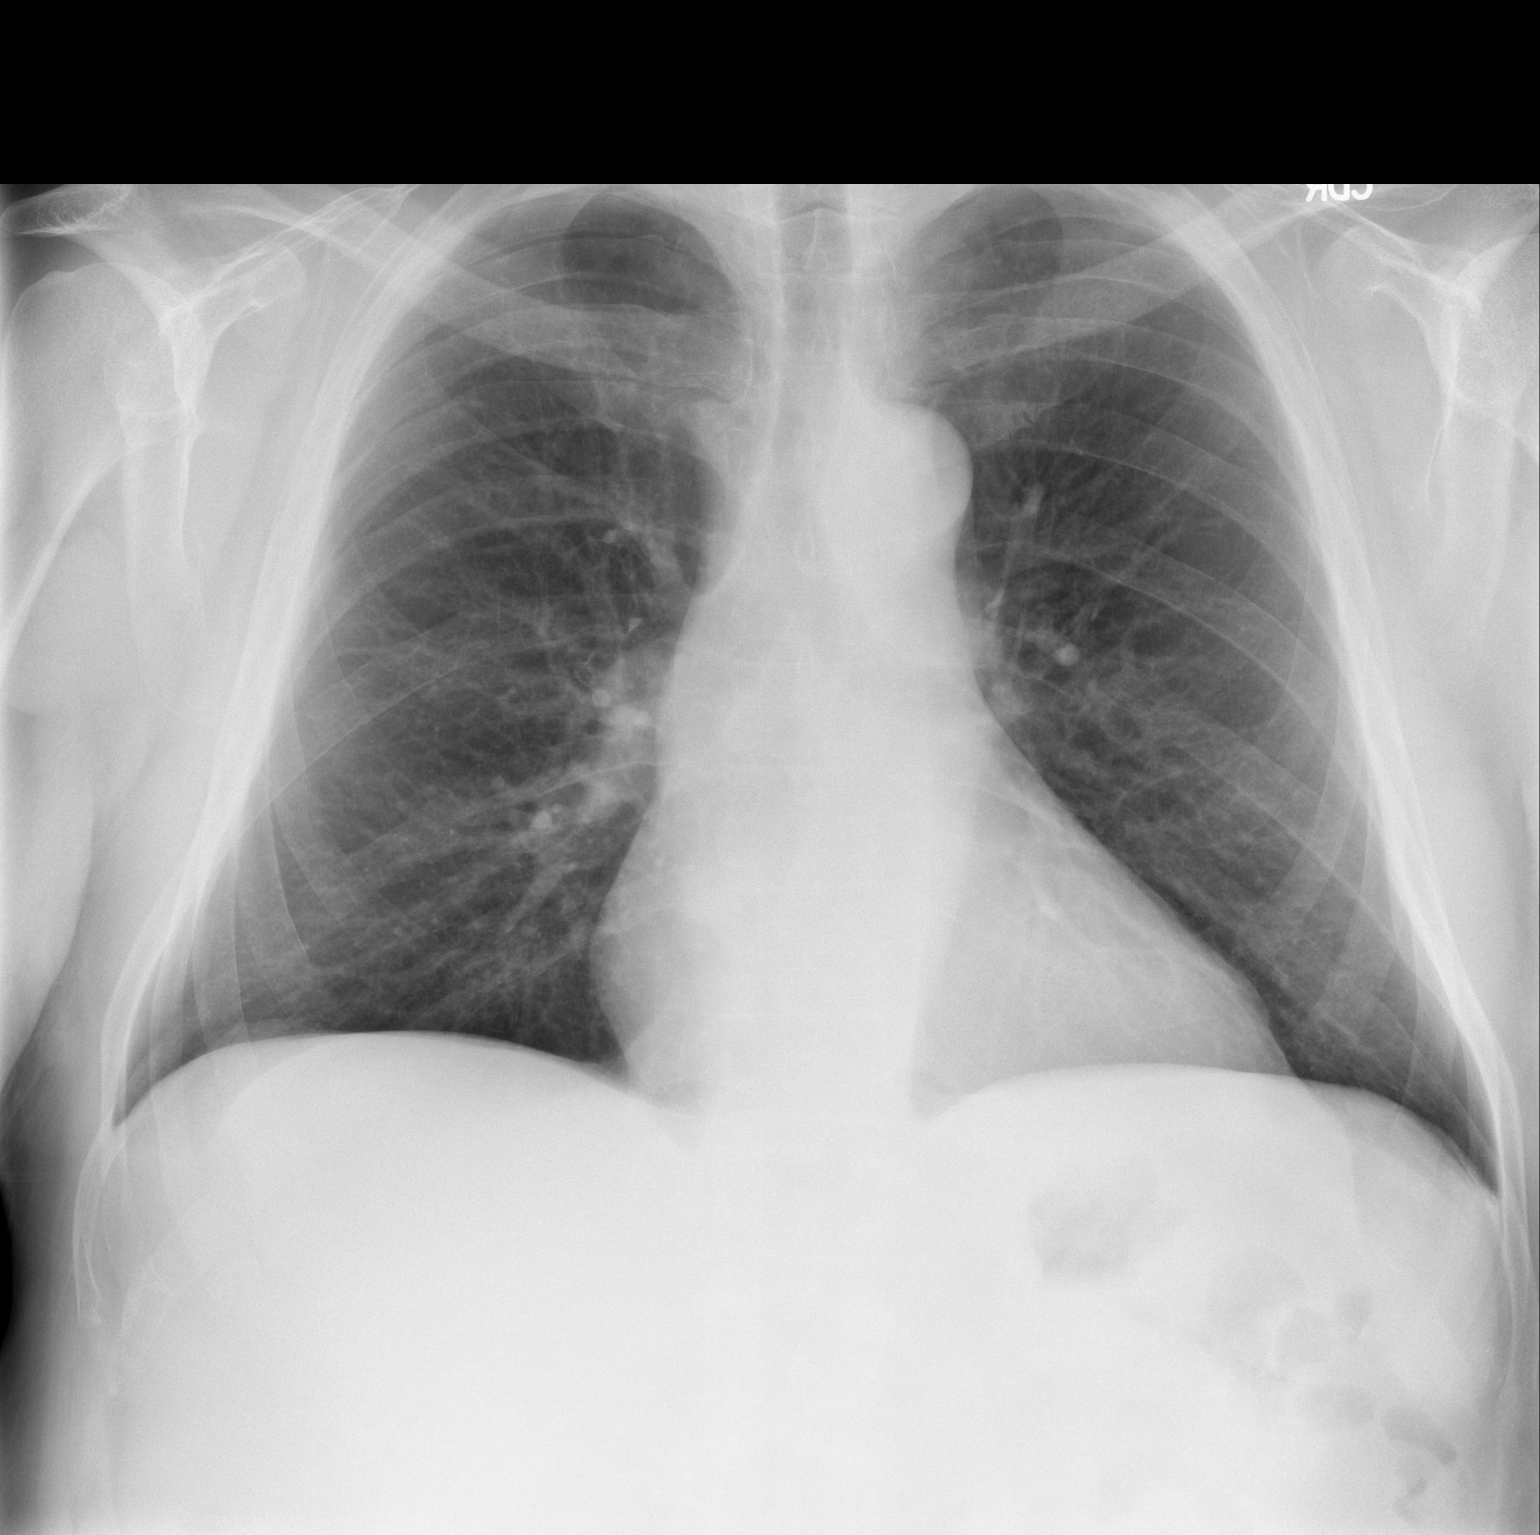

[w chest lat]
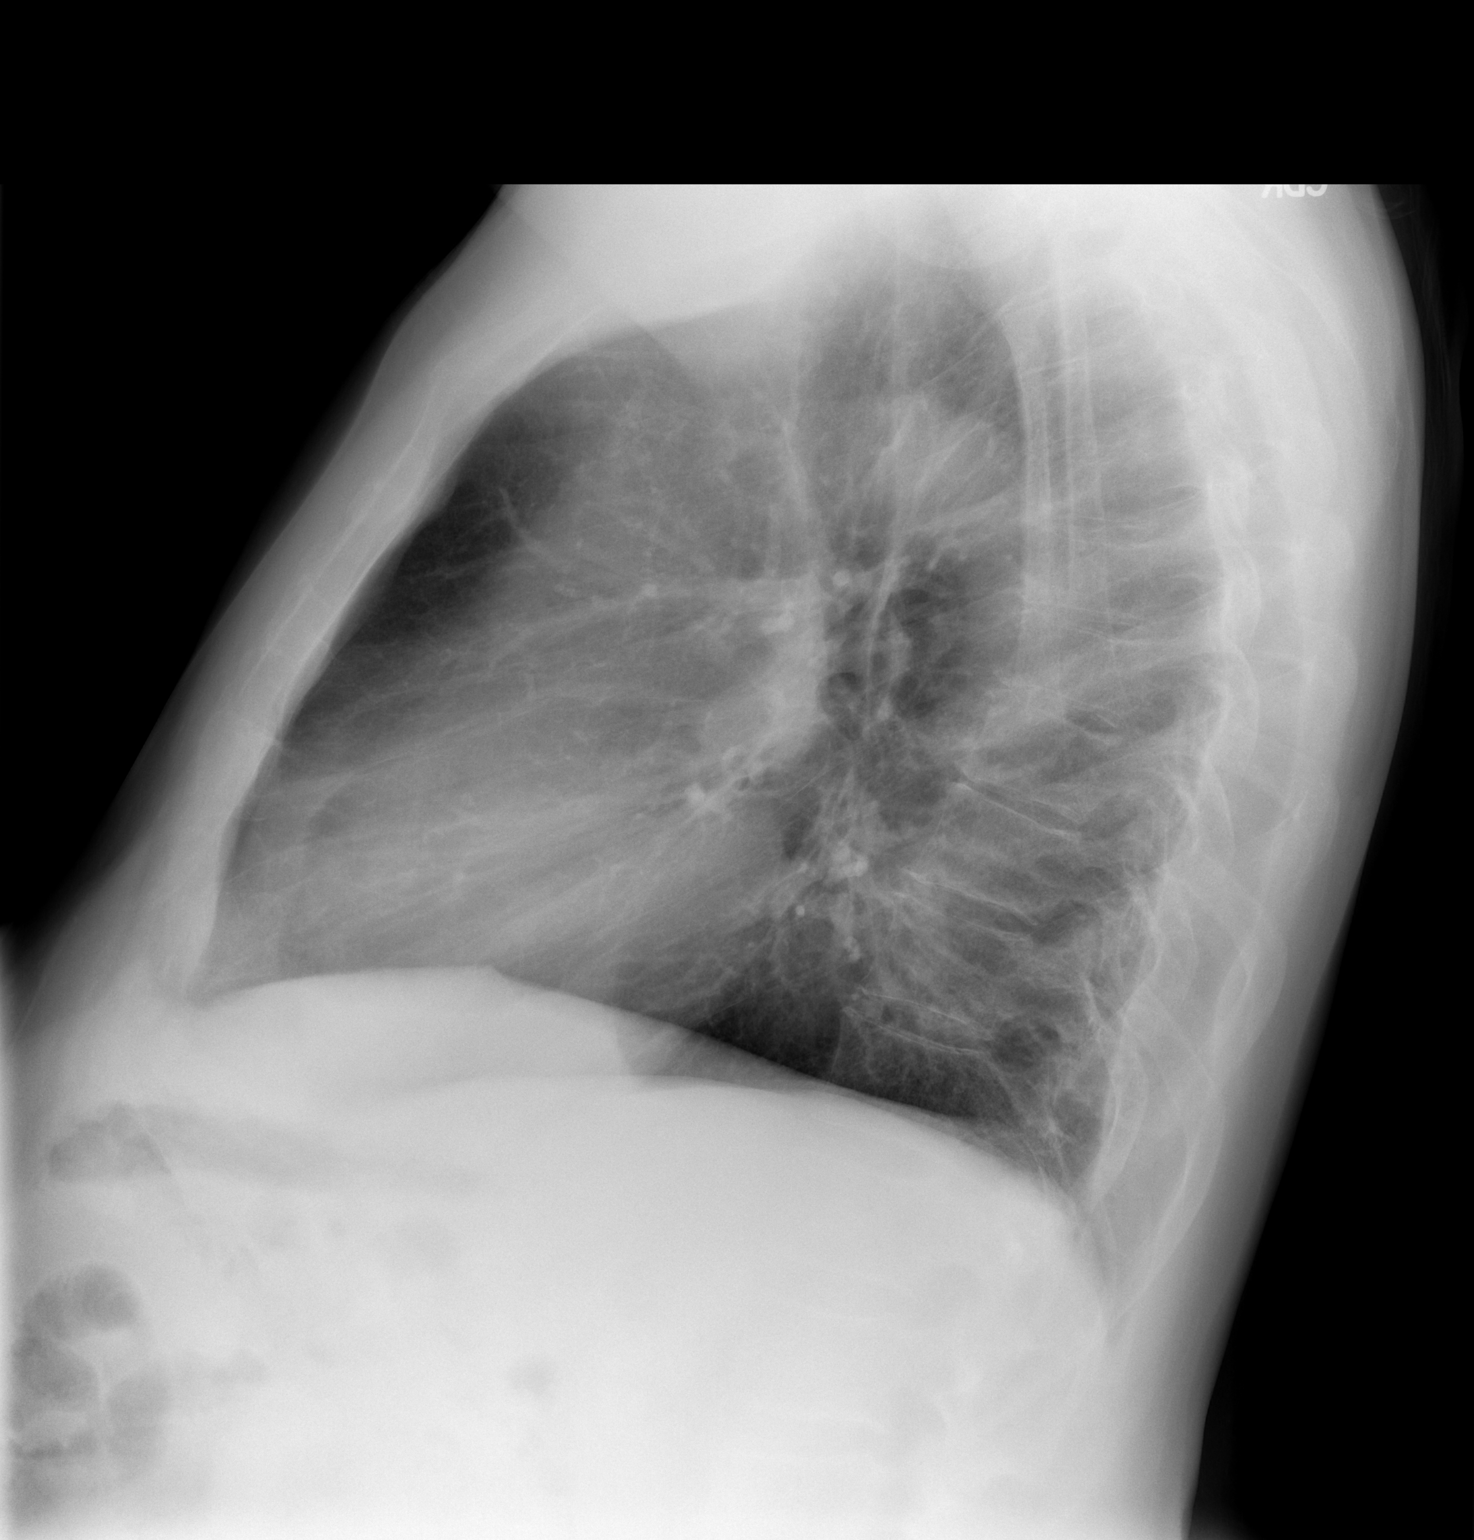

[2 of 2 positions shown; findings below may reference images not displayed]

FINDINGS: Mild cardiac enlargement. Negative for heart failure. Lungs are
clear without infiltrate effusion or mass.
IMPRESSION: No active cardiopulmonary disease.

## 2014-04-09 ENCOUNTER — Ambulatory Visit: Payer: Medicare Other | Admitting: Pharmacist

## 2014-04-10 ENCOUNTER — Ambulatory Visit (HOSPITAL_COMMUNITY)
Admission: RE | Admit: 2014-04-10 | Discharge: 2014-04-10 | Disposition: A | Discharge status: Home / Self Care | Payer: Medicare Other | Source: Ambulatory Visit | Attending: Nurse Practitioner | Admitting: Nurse Practitioner

## 2014-04-10 ENCOUNTER — Encounter (HOSPITAL_COMMUNITY): Payer: Self-pay | Admitting: Nurse Practitioner

## 2014-04-10 DIAGNOSIS — Z79899 Other long term (current) drug therapy: Secondary | ICD-10-CM | POA: Insufficient documentation

## 2014-04-10 DIAGNOSIS — I480 Paroxysmal atrial fibrillation: Secondary | ICD-10-CM | POA: Diagnosis not present

## 2014-04-10 DIAGNOSIS — Z87891 Personal history of nicotine dependence: Secondary | ICD-10-CM | POA: Diagnosis not present

## 2014-04-10 DIAGNOSIS — E785 Hyperlipidemia, unspecified: Secondary | ICD-10-CM | POA: Diagnosis not present

## 2014-04-10 DIAGNOSIS — I440 Atrioventricular block, first degree: Secondary | ICD-10-CM | POA: Insufficient documentation

## 2014-04-10 DIAGNOSIS — I429 Cardiomyopathy, unspecified: Secondary | ICD-10-CM | POA: Diagnosis not present

## 2014-04-10 DIAGNOSIS — R0602 Shortness of breath: Secondary | ICD-10-CM | POA: Diagnosis not present

## 2014-04-10 DIAGNOSIS — I481 Persistent atrial fibrillation: Secondary | ICD-10-CM

## 2014-04-10 DIAGNOSIS — I10 Essential (primary) hypertension: Secondary | ICD-10-CM | POA: Diagnosis not present

## 2014-04-10 DIAGNOSIS — I4819 Other persistent atrial fibrillation: Secondary | ICD-10-CM

## 2014-04-10 DIAGNOSIS — F329 Major depressive disorder, single episode, unspecified: Secondary | ICD-10-CM | POA: Diagnosis not present

## 2014-04-10 DIAGNOSIS — G4733 Obstructive sleep apnea (adult) (pediatric): Secondary | ICD-10-CM | POA: Diagnosis not present

## 2014-04-10 NOTE — Progress Notes (Signed)
PCP:  [name], MD Primary Cardiologist:  Dr Claiborne Billings EP- Dr. Rayann Heman  The patient presents today for electrophysiology followup.  [name] [name] is [name] [name] has had several cardioversions recently and remains in afib doing well since tikosyn load on 03/09/14.[name] [name] is greatly enjoying normal rhythm and feels much improved. Energy level much higher and sleeping very soundly. Last labs on 03/12/14, mag 2.3 and Kt 4.7.  Today, [name] [name] denies symptoms of chest pain, dizziness, presyncope, syncope, or neurologic sequela.  The patient feels that [name] [name] is tolerating medications without difficulties and is otherwise without complaint today.   Past Medical History  Diagnosis Date  . Paroxysmal atrial fibrillation     a. s/p afib ablations 2002 and 2009 by Dr Rolland Porter. b. s/p DCCV 02/2013. c. s/p DCCV 01/19/2014  . H/O cardiac catheterization     no significant CAD by cath 5/12  . Cardiomyopathy     a. 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 b. also hx of tachycardia mediated CM, now resolved  . Hypertension   . Hyperlipidemia   . Thoracic aortic aneurysm     a. Borderline enlarged by CT 12/14 at 4.0cm.  . Depression   . OSA (obstructive sleep apnea)     AHI avg 5/hr  . Limb pain 06/20/2007    LE venous doppler - no evidence of thrombus or thrombophlebitis  . PVC (premature ventricular contraction) 12/18/2009    cardionet monitor 21 days - some PVCs, ventricular bigeminy  . Pericardial effusion     a. Since 2007. b. s/p pericardial window 04/2013.  Marland Kitchen Echocardiogram abnormal     EF60%, mild LVH, PA pk pressure 35, pericardial effusion mild increase from 2012   . Complication of anesthesia     Pt has anxiety issues and thinks [name] [name] is going to die.  . Liver mass     a. CT 03/2013: Small probable cysts in the liver and small hyperenhancing liver mass.   Past Surgical History  Procedure Laterality Date  . Cardiac catheterization  08/25/10    NO SIGNIFICANT CAD  . Cardioversion  10/18/2007    successful   . Elbow / upper arm foreign body removal    . Incision and drainage of wound  09/07/2011    Procedure: IRRIGATION AND DEBRIDEMENT WOUND;  Surgeon: Kerin Salen, MD;  Location: [place];  Service: Orthopedics;  Laterality: Left;  . Transesophageal echocardiogram  08/26/2010    see Notes tab  . Cardiovascular stress test  09/08/2005    R/S MV - EF 61%; Exercises capacity 12 Mets; essentially normal perfusion myocardial scan    . Cardiac electrophysiology mapping and ablation  [id number]    Dr. Tally Due  . Osteosynthesis Left 09/30/2012    with small plate in the ulna  . Cardioversion N/A 02/08/2013    Procedure: CARDIOVERSION;  Surgeon: Troy Sine, MD;  Location: [facility] ENDOSCOPY;  Service: Cardiovascular;  Laterality: N/A;  . Fracture surgery Left     Left wrist  . Subxyphoid pericardial window N/A 04/25/2013    Procedure: SUBXYPHOID PERICARDIAL WINDOW;  Surgeon: Gaye Pollack, MD;  Location: [place] OR;  Service: Thoracic;  Laterality: N/A;  . Carpal tunnel release Left April 2015  . Cardioversion N/A 08/17/2013    Procedure: CARDIOVERSION;  Surgeon: Sanda Klein, MD;  Location: [place];  Service: Cardiovascular;  Laterality: N/A;  . Cardioversion N/A 12/28/2013    Procedure: CARDIOVERSION;  Surgeon: Fay Records, MD;  Location: [place];  Service:  Cardiovascular;  Laterality: N/A;  . Cardioversion N/A 01/19/2014    Procedure: CARDIOVERSION;  Surgeon: Sanda Klein, MD;  Location: [place];  Service: Cardiovascular;  Laterality: N/A;  . Cardioversion N/A 03/11/2014    Procedure: CARDIOVERSION;  Surgeon: Evans Lance, MD;  Location: [place];  Service: Cardiovascular;  Laterality: N/A;  . Left and right heart catheterization with coronary angiogram N/A 03/17/2013    Procedure: LEFT AND RIGHT HEART CATHETERIZATION WITH CORONARY ANGIOGRAM;  Surgeon: Troy Sine, MD;  Location: [facility] CATH LAB;  Service: Cardiovascular;  Laterality: N/A;    Current Outpatient Prescriptions  Medication  Sig Dispense Refill  . atorvastatin (LIPITOR) 40 MG tablet Take 1 tablet (40 mg total) by mouth daily. 90 tablet 3  . Cholecalciferol (VITAMIN D) 2000 UNITS CAPS Take 2,000 Units by mouth daily.    . dabigatran (PRADAXA) 150 MG CAPS capsule Take 1 capsule (150 mg total) by mouth 2 (two) times daily. 180 capsule 3  . diltiazem (CARDIZEM CD) 240 MG 24 hr capsule Take 1 capsule (240 mg total) by mouth daily. 90 capsule 3  . dofetilide (TIKOSYN) 500 MCG capsule Take 1 capsule (500 mcg total) by mouth 2 (two) times daily. 180 capsule 4  . finasteride (PROPECIA) 1 MG tablet Take 1 mg by mouth every morning.     . furosemide (LASIX) 40 MG tablet Take 1 tablet (40 mg total) by mouth 2 (two) times daily. 180 tablet 3  . metoprolol succinate (TOPROL-XL) 100 MG 24 hr tablet Take 1 tablet (100 mg total) by mouth 2 (two) times daily. Take with or immediately following a meal. 180 tablet 3  . pantoprazole (PROTONIX) 40 MG tablet Take 1 tablet (40 mg total) by mouth at bedtime. 90 tablet 3  . potassium chloride SA (K-DUR,KLOR-CON) 20 MEQ tablet Take 1 1/2 tablets (30mg ) in the morning    . PRADAXA 150 MG CAPS capsule TAKE ONE CAPSULE BY MOUTH TWICE A DAY 180 capsule 1  . Saw Palmetto, Serenoa repens, (SAW PALMETTO PO) Take 900 mg by mouth 2 (two) times daily.      No current facility-administered medications for this encounter.    Allergies  Allergen Reactions  . Tetracyclines & Related Hives    History   Social History  . Marital Status: Single    Spouse Name: N/A    Number of Children: 0  . Years of Education: N/A   Occupational History  .  [place] History Main Topics  . Smoking status: Former Smoker -- 1.00 packs/day for 10 years    Quit date: 02/06/1989  . Smokeless tobacco: Never Used  . Alcohol Use: Yes     Comment: 2 bottles of wine per week  . Drug Use: No  . Sexual Activity: Not on file   Other Topics Concern  . 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  [name] [name] is single and has no children.    Family History  Problem Relation Age of Onset  . Coronary artery disease Father   . Stroke Mother     died  . Atrial fibrillation Brother   . Heart attack Father   . Mitral valve prolapse Sister     ROS-  Reviewed and negative.  Physical Exam: Filed Vitals:   04/10/14 1604  BP:  122/78  Pulse: 67  Resp: 12  Height: 6\' 2"  (1.88 m)  Weight: 207 lb 12.8 oz (94.257 kg)  SpO2: 100%    GEN- The patient is well appearing, alert and oriented x 3 today.   Head- normocephalic, atraumatic Eyes-  Sclera clear, conjunctiva pink Ears- hearing intact Oropharynx- clear Neck- supple, no JVD Lungs- clear, normal wob Heart- RRR, no murmurs GI- soft, NT, ND, + BS Extremities- no clubbing, cyanosis,trace edema MS- L wrist scar is well healed Skin- no rash or lesion Psych- euthymic mood, full affect Neuro- strength and sensation are intact  ekg today reveals SR with first degree av block, 64 bpm,, PR 220ms, QRS 90 ms, QTc 440ms. Echo is reviewed Epic notes are reviewed  Assessment and Plan:  1. Persistent afib Maintaining SR on Tikosyn.  Last labs normal, will need to be repeated in early march.  2. Shortness of breath/ edema Resolved with sinus rhythm  3. OSA Compliance with CPAP is encouraged.    4. HTN Stable  F/u with Dr. Rayann Heman, in March, as already scheduled, sooner if needed.

## 2014-04-10 NOTE — Patient Instructions (Signed)
Your physician recommends that you continue on your current medications as directed. Please refer to the Current Medication list given to you today.   Your physician recommends that you schedule a follow-up appointment in: [place].

## 2014-04-19 ENCOUNTER — Encounter (HOSPITAL_COMMUNITY): Payer: Self-pay | Admitting: Orthopedic Surgery

## 2014-05-10 IMAGING — CR DG CHEST 2V
2 series · 2 of 2 positions shown · non-contrast
Comparison: None.

CLINICAL DATA: Preop radiograph.  Pericardial effusion.

EXAM:
CHEST  2 VIEW

[w chest pa]
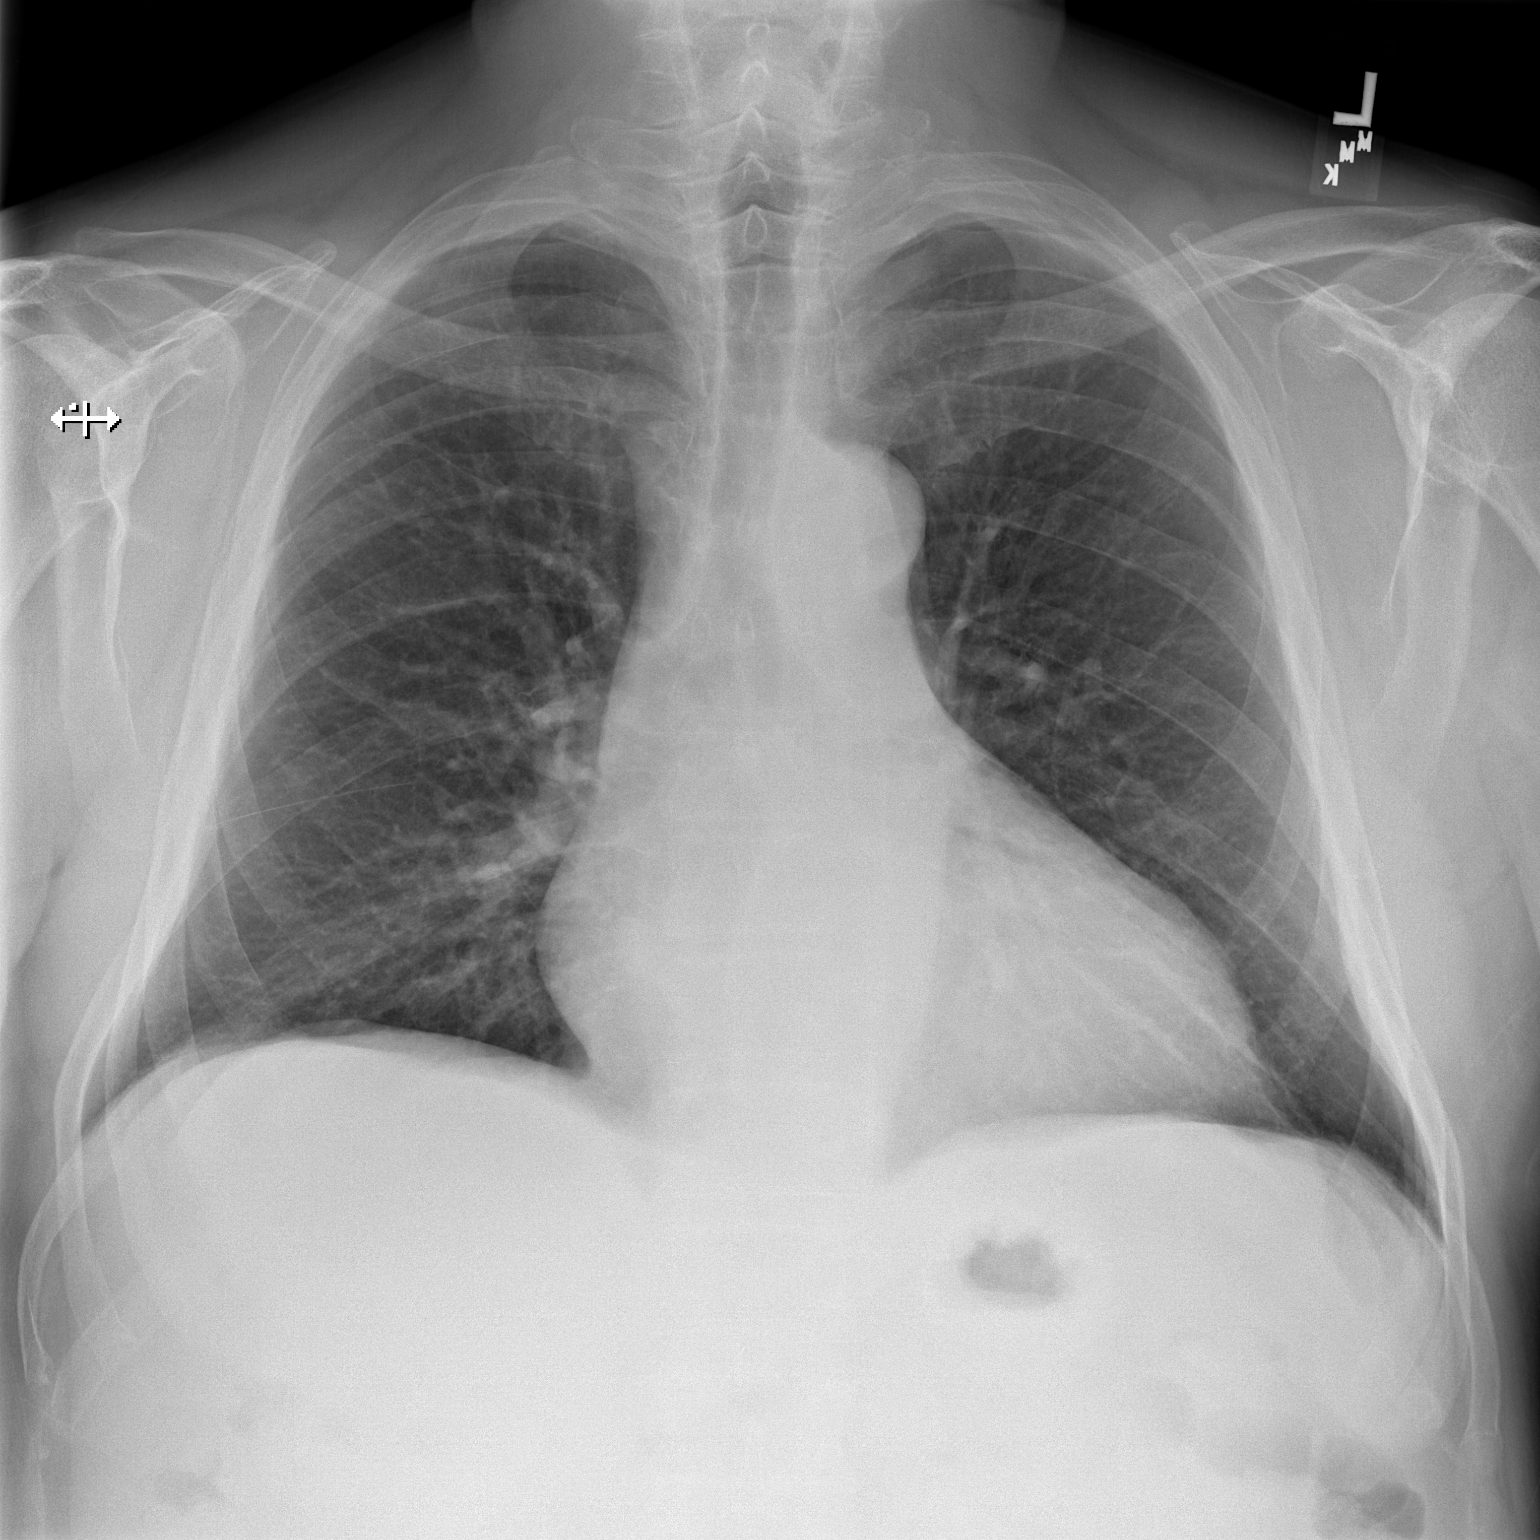

[w chest lat]
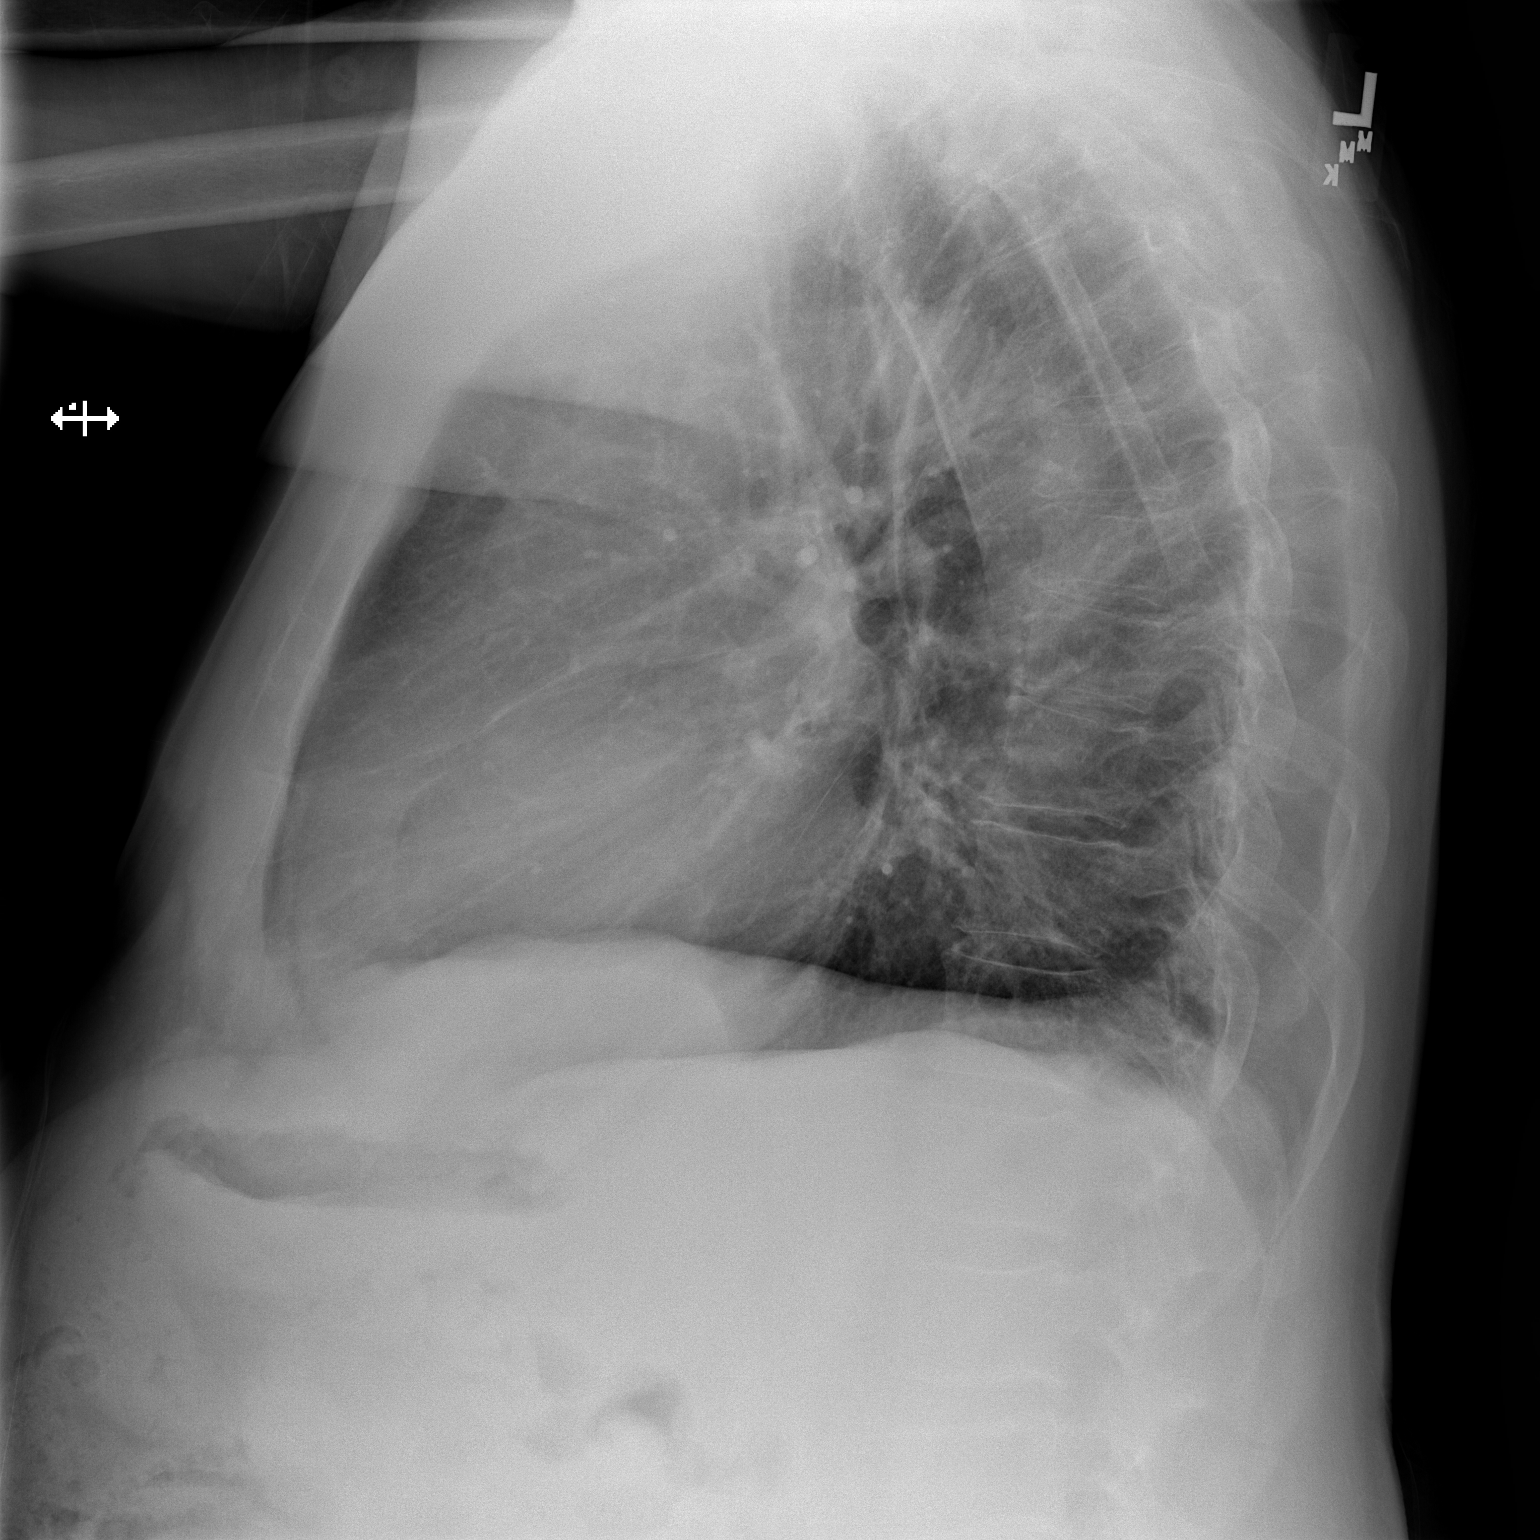

[2 of 2 positions shown; findings below may reference images not displayed]

FINDINGS: Moderate enlargement of the cardiac silhouette is identified. Both
lungs are clear. The visualized skeletal structures are
unremarkable.
IMPRESSION: Moderate enlargement of the cardiac silhouette.

## 2014-05-11 ENCOUNTER — Telehealth: Payer: Self-pay | Admitting: Internal Medicine

## 2014-05-11 NOTE — Telephone Encounter (Signed)
[name] [name] called stating that he is in [place], and after giving a talk at [facility], he felt lightheaded and was back in A-Fib.  He is concerned that this is the first time he has been in fib since starting tikosyn.  He feels well now, but is still in afib.  He plans to go to the urgent care center/ER tonight or tomorrow morning for electrolyte check and possible cardioversion.    Stephani Police, MD

## 2014-05-13 ENCOUNTER — Encounter: Payer: Self-pay | Admitting: Internal Medicine

## 2014-06-11 ENCOUNTER — Encounter: Payer: Self-pay | Admitting: Internal Medicine

## 2014-06-11 ENCOUNTER — Ambulatory Visit (INDEPENDENT_AMBULATORY_CARE_PROVIDER_SITE_OTHER): Payer: Medicare Other | Admitting: Internal Medicine

## 2014-06-11 VITALS — BP 118/78 | HR 66 | Ht 74.0 in | Wt 206.8 lb

## 2014-06-11 DIAGNOSIS — I10 Essential (primary) hypertension: Secondary | ICD-10-CM

## 2014-06-11 DIAGNOSIS — I481 Persistent atrial fibrillation: Secondary | ICD-10-CM

## 2014-06-11 DIAGNOSIS — I4819 Other persistent atrial fibrillation: Secondary | ICD-10-CM

## 2014-06-11 LAB — CBC WITH DIFFERENTIAL/PLATELET
Basophils Absolute: 0 10*3/uL (ref 0.0–0.1)
Basophils Relative: 0.5 % (ref 0.0–3.0)
Eosinophils Absolute: 0.2 10*3/uL (ref 0.0–0.7)
Eosinophils Relative: 1.7 % (ref 0.0–5.0)
HCT: 40.8 % (ref 39.0–52.0)
Hemoglobin: 13.6 g/dL (ref 13.0–17.0)
Lymphocytes Relative: 27.2 % (ref 12.0–46.0)
Lymphs Abs: 2.5 10*3/uL (ref 0.7–4.0)
MCHC: 33.4 g/dL (ref 30.0–36.0)
MCV: 85.2 fl (ref 78.0–100.0)
Monocytes Absolute: 0.8 10*3/uL (ref 0.1–1.0)
Monocytes Relative: 8.8 % (ref 3.0–12.0)
Neutro Abs: 5.7 10*3/uL (ref 1.4–7.7)
Neutrophils Relative %: 61.8 % (ref 43.0–77.0)
Platelets: 247 10*3/uL (ref 150.0–400.0)
RBC: 4.79 Mil/uL (ref 4.22–5.81)
RDW: 12.9 % (ref 11.5–15.5)
WBC: 9.2 10*3/uL (ref 4.0–10.5)

## 2014-06-11 LAB — BASIC METABOLIC PANEL
BUN: 20 mg/dL (ref 6–23)
CO2: 33 mEq/L — ABNORMAL HIGH (ref 19–32)
Calcium: 10.1 mg/dL (ref 8.4–10.5)
Chloride: 99 mEq/L (ref 96–112)
Creatinine, Ser: 1.18 mg/dL (ref 0.40–1.50)
GFR: 65.29 mL/min (ref >60.00)
Glucose, Bld: 100 mg/dL — ABNORMAL HIGH (ref 70–99)
Potassium: 3.9 mEq/L (ref 3.5–5.1)
SODIUM: 138 meq/L (ref 135–145)

## 2014-06-11 LAB — MAGNESIUM: Magnesium: 2.3 mg/dL (ref 1.5–2.5)

## 2014-06-11 MED ORDER — FUROSEMIDE 40 MG PO TABS: 40.0000 mg | ORAL_TABLET | Freq: Every day | ORAL | Status: DC

## 2014-06-11 NOTE — Progress Notes (Signed)
Electrophysiology Office Note   Date:  06/11/2014   ID:  [name] [name], DOB 12-25-1945, MRN [id number]  PCP:  [name], MD  Cardiologist:  Dr Claiborne Billings Primary Electrophysiologist: Thompson Grayer, MD    Chief Complaint  Patient presents with  . Follow-up    Persistent AFIB     History of Present Illness: [name] [name] is a 69 y.o. male who presents today for electrophysiology evaluation.   SInce I saw him last, he returned to afib while in [place].  He required cardioversion.  He is unaware of triggers for the episode.  He has had no alcohol.  He did not well tolerate afib.  He reports compliance with medicine.  His SOB is improved.  Today, he denies symptoms of palpitations, chest pain,  orthopnea, PND, lower extremity edema, claudication, dizziness, presyncope, syncope, bleeding, or neurologic sequela. The patient is tolerating medications without difficulties and is otherwise without complaint today.    Past Medical History  Diagnosis Date  . Paroxysmal atrial fibrillation     a. s/p afib ablations 2002 and 2009 by Dr Rolland Porter. b. s/p DCCV 02/2013. c. s/p DCCV 01/19/2014  . H/O cardiac catheterization     no significant CAD by cath 5/12  . Cardiomyopathy     a. 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 b. also hx of tachycardia mediated CM, now resolved  . Hypertension   . Hyperlipidemia   . Thoracic aortic aneurysm     a. Borderline enlarged by CT 12/14 at 4.0cm.  . Depression   . OSA (obstructive sleep apnea)     AHI avg 5/hr  . Limb pain 06/20/2007    LE venous doppler - no evidence of thrombus or thrombophlebitis  . PVC (premature ventricular contraction) 12/18/2009    cardionet monitor 21 days - some PVCs, ventricular bigeminy  . Pericardial effusion     a. Since 2007. b. s/p pericardial window 04/2013.  Marland Kitchen Echocardiogram abnormal     EF60%, mild LVH, PA pk pressure 35, pericardial effusion mild increase from 2012   . Complication of anesthesia    Pt has anxiety issues and thinks he is going to die.  . Liver mass     a. CT 03/2013: Small probable cysts in the liver and small hyperenhancing liver mass.   Past Surgical History  Procedure Laterality Date  . Cardiac catheterization  08/25/10    NO SIGNIFICANT CAD  . Cardioversion  10/18/2007    successful  . Elbow / upper arm foreign body removal    . Transesophageal echocardiogram  08/26/2010    see Notes tab  . Cardiovascular stress test  09/08/2005    R/S MV - EF 61%; Exercises capacity 12 Mets; essentially normal perfusion myocardial scan    . Cardiac electrophysiology mapping and ablation  [id number]    Dr. Tally Due  . Osteosynthesis Left 09/30/2012    with small plate in the ulna  . Cardioversion N/A 02/08/2013    Procedure: CARDIOVERSION;  Surgeon: Troy Sine, MD;  Location: [facility] ENDOSCOPY;  Service: Cardiovascular;  Laterality: N/A;  . Fracture surgery Left     Left wrist  . Subxyphoid pericardial window N/A 04/25/2013    Procedure: SUBXYPHOID PERICARDIAL WINDOW;  Surgeon: Gaye Pollack, MD;  Location: [place] OR;  Service: Thoracic;  Laterality: N/A;  . Carpal tunnel release Left April 2015  . Cardioversion N/A 08/17/2013    Procedure: CARDIOVERSION;  Surgeon: Sanda Klein, MD;  Location: [place];  Service: Cardiovascular;  Laterality: N/A;  . Cardioversion N/A 12/28/2013    Procedure: CARDIOVERSION;  Surgeon: Fay Records, MD;  Location: [place];  Service: Cardiovascular;  Laterality: N/A;  . Cardioversion N/A 01/19/2014    Procedure: CARDIOVERSION;  Surgeon: Sanda Klein, MD;  Location: [facility] ENDOSCOPY;  Service: Cardiovascular;  Laterality: N/A;  . Cardioversion N/A 03/11/2014    Procedure: CARDIOVERSION;  Surgeon: Evans Lance, MD;  Location: [place];  Service: Cardiovascular;  Laterality: N/A;  . Left and right heart catheterization with coronary angiogram N/A 03/17/2013    Procedure: LEFT AND RIGHT HEART CATHETERIZATION WITH CORONARY ANGIOGRAM;  Surgeon: Troy Sine, MD;  Location: [facility] CATH LAB;  Service: Cardiovascular;  Laterality: N/A;     Current Outpatient Prescriptions  Medication Sig Dispense Refill  . atorvastatin (LIPITOR) 40 MG tablet Take 1 tablet (40 mg total) by mouth daily. 90 tablet 3  . Cholecalciferol (VITAMIN D) 2000 UNITS CAPS Take 2,000 Units by mouth daily.    . dabigatran (PRADAXA) 150 MG CAPS capsule Take 1 capsule (150 mg total) by mouth 2 (two) times daily. 180 capsule 3  . diltiazem (CARDIZEM CD) 240 MG 24 hr capsule Take 1 capsule (240 mg total) by mouth daily. 90 capsule 3  . dofetilide (TIKOSYN) 500 MCG capsule Take 1 capsule (500 mcg total) by mouth 2 (two) times daily. 180 capsule 4  . finasteride (PROPECIA) 1 MG tablet Take 1 mg by mouth every morning.     . furosemide (LASIX) 40 MG tablet Take 1 tablet (40 mg total) by mouth daily. 90 tablet 3  . metoprolol succinate (TOPROL-XL) 100 MG 24 hr tablet Take 1 tablet (100 mg total) by mouth 2 (two) times daily. Take with or immediately following a meal. 180 tablet 3  . pantoprazole (PROTONIX) 40 MG tablet Take 1 tablet (40 mg total) by mouth at bedtime. 90 tablet 3  . potassium chloride SA (K-DUR,KLOR-CON) 20 MEQ tablet Take 1 1/2 tablets (30mg ) by mouth in the morning    . Saw Palmetto, Serenoa repens, (SAW PALMETTO PO) Take 900 mg by mouth 2 (two) times daily.      No current facility-administered medications for this visit.    Allergies:   Tetracyclines & related   Social History:  The patient  reports that he quit smoking about 25 years ago. He has never used smokeless tobacco. He reports that he drinks alcohol. He reports that he does not use illicit drugs.   Family History:  The patient's  family history includes Atrial fibrillation in his brother; Coronary artery disease in his father; Heart attack in his father; Mitral valve prolapse in his sister; Stroke in his mother.    ROS:  Please see the history of present illness.   All other systems are reviewed and  negative.    PHYSICAL EXAM: VS:  BP 118/78 mmHg  Pulse 66  Ht 6\' 2"  (1.88 m)  Wt 206 lb 12.8 oz (93.804 kg)  BMI 26.54 kg/m2 , BMI Body mass index is 26.54 kg/(m^2). GEN: Well nourished, well developed, in no acute distress HEENT: normal Neck: no JVD, carotid bruits, or masses Cardiac: RRR; no murmurs, rubs, or gallops,no edema  Respiratory:  clear to auscultation bilaterally, normal work of breathing GI: soft, nontender, nondistended, + BS MS: no deformity or atrophy Skin: warm and dry  Neuro:  Strength and sensation are intact Psych: euthymic mood, full affect  EKG:  EKG is ordered today. The ekg ordered today shows sinus  rhythm 66 bpm, PR 218, septal infarct, otherwise normal ekg   Recent Labs: 06/11/2014: BUN 20; Creatinine 1.18; Hemoglobin 13.6; Magnesium 2.3; Platelets 247.0; Potassium 3.9; Sodium 138    Lipid Panel     Component Value Date/Time   CHOL  08/23/2007 0910    188        ATP III CLASSIFICATION:  <200     mg/dL   Desirable  200-239  mg/dL   Borderline High  >=240    mg/dL   High   TRIG 63 08/23/2007 0910   HDL 47 08/23/2007 0910   CHOLHDL 4.0 08/23/2007 0910   VLDL 13 08/23/2007 0910   LDLCALC * 08/23/2007 0910    128        Total Cholesterol/HDL:CHD Risk Coronary Heart Disease Risk Table                     Men   Women  1/2 Average Risk   3.4   3.3     Wt Readings from Last 3 Encounters:  06/11/14 206 lb 12.8 oz (93.804 kg)  03/09/14 212 lb 4.9 oz (96.3 kg)  01/24/14 214 lb (97.07 kg)     ASSESSMENT AND PLAN:  1. Persistent afib He continues to struggle with symptomatic persistent afib.  He is s/p prior ablation at [facility] in 2002 and 2009. Therapeutic strategies for afib including medicine and ablation were discussed in detail with the patient today.  Risk, benefits, and alternatives to EP study and repeat radiofrequency ablation for afib were also discussed in detail today. The patient would like to consider repeat ablation this summer.  He  will return in 2 months to discuss this further.  I would likely plan for a cardiac CT prior to ablation to evaluate for pulmonary vein stenosis from prior ablations at [facility]. Continue current medicines at this time.  BMET, MG on tikosyn.  CBC today on pradaxa.  2. Shortness of breath/ edema Resolved with sinus rhythm  3. OSA Compliance with CPAP is encouraged.    4. HTN Stable  Current medicines are reviewed at length with the patient today.   The patient does not have concerns regarding his medicines.  The following changes were made today:  none  Labs/ tests ordered today include:  Orders Placed This Encounter  Procedures  . CBC with Differential  . Basic metabolic panel  . Magnesium  . EKG 12-Lead    Follow-up: return to see me in 2 months to discuss possible ablation   Signed, Thompson Grayer, MD  06/11/2014 11:21 PM     [facility] [address] [place] [address] [phone] (office) [phone] (fax)

## 2014-06-11 NOTE — Patient Instructions (Signed)
Your physician recommends that you schedule a follow-up appointment in: 2 months with Dr Rayann Heman  Your physician recommends that you return for lab work today: CBC/BMP/Mag  Your physician has recommended you make the following change in your medication:  1) Decrease Furosemide to 40 mg daily

## 2014-06-14 ENCOUNTER — Encounter: Payer: Self-pay | Admitting: Internal Medicine

## 2014-07-18 DIAGNOSIS — Z973 Presence of spectacles and contact lenses: Secondary | ICD-10-CM | POA: Insufficient documentation

## 2014-08-13 ENCOUNTER — Encounter: Payer: Self-pay | Admitting: Internal Medicine

## 2014-08-13 ENCOUNTER — Ambulatory Visit (INDEPENDENT_AMBULATORY_CARE_PROVIDER_SITE_OTHER): Payer: Medicare Other | Admitting: Internal Medicine

## 2014-08-13 ENCOUNTER — Encounter: Payer: Self-pay | Admitting: *Deleted

## 2014-08-13 VITALS — BP 118/72 | HR 62 | Ht 74.0 in | Wt 204.8 lb

## 2014-08-13 DIAGNOSIS — I481 Persistent atrial fibrillation: Secondary | ICD-10-CM | POA: Diagnosis not present

## 2014-08-13 DIAGNOSIS — I480 Paroxysmal atrial fibrillation: Secondary | ICD-10-CM

## 2014-08-13 DIAGNOSIS — I10 Essential (primary) hypertension: Secondary | ICD-10-CM

## 2014-08-13 DIAGNOSIS — Z7901 Long term (current) use of anticoagulants: Secondary | ICD-10-CM

## 2014-08-13 DIAGNOSIS — I4819 Other persistent atrial fibrillation: Secondary | ICD-10-CM

## 2014-08-13 NOTE — Patient Instructions (Addendum)
Medication Instructions:  Your physician recommends that you continue on your current medications as directed. Please refer to the Current Medication list given to you today.   Labwork: Your physician recommends that you return for lab work on 09/18/14 at 12:00noon  You do not have to fast   Testing/Procedures: Your physician has recommended that you have an ablation. Catheter ablation is a medical procedure used to treat some cardiac arrhythmias (irregular heartbeats). During catheter ablation, a long, thin, flexible tube is put into a blood vessel in your groin (upper thigh), or neck. This tube is called an ablation catheter. It is then guided to your heart through the blood vessel. Radio frequency waves destroy small areas of heart tissue where abnormal heartbeats may cause an arrhythmia to start. Please see the instruction sheet given to you today.   Your physician has requested that you have cardiac CT. Cardiac computed tomography (CT) is a painless test that uses an x-ray machine to take clear, detailed pictures of your heart. For further information please visit [email or web address]. Please follow instruction sheet as given.       Follow-Up: They will schedule a follow up at the hospital  Any Other Special Instructions Will Be Listed Below (If Applicable).

## 2014-08-13 NOTE — Progress Notes (Signed)
Electrophysiology Office Note   Date:  08/13/2014   ID:  [name] [name], DOB 13-Sep-1945, MRN [id number]  PCP:  [name], MD  Cardiologist:  Dr Claiborne Billings Primary Electrophysiologist: Thompson Grayer, MD    Chief Complaint  Patient presents with  . Persistent AFIB     History of Present Illness: [name] [name] is a 69 y.o. male who presents today for electrophysiology evaluation.  He is still in sinus rhythm.  He has difficulty with poor concentration on metoprolol.   He has a mildly elevated PSA and is following with urology. Today, he denies symptoms of palpitations, chest pain,  orthopnea, PND, lower extremity edema, claudication, dizziness, presyncope, syncope, bleeding, or neurologic sequela.    Past Medical History  Diagnosis Date  . Paroxysmal atrial fibrillation     a. s/p afib ablations 2002 and 2009 by Dr Rolland Porter. b. s/p DCCV 02/2013. c. s/p DCCV 01/19/2014  . H/O cardiac catheterization     no significant CAD by cath 5/12  . Cardiomyopathy     a. 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 b. also hx of tachycardia mediated CM, now resolved  . Hypertension   . Hyperlipidemia   . Thoracic aortic aneurysm     a. Borderline enlarged by CT 12/14 at 4.0cm.  . Depression   . OSA (obstructive sleep apnea)     AHI avg 5/hr  . Limb pain 06/20/2007    LE venous doppler - no evidence of thrombus or thrombophlebitis  . PVC (premature ventricular contraction) 12/18/2009    cardionet monitor 21 days - some PVCs, ventricular bigeminy  . Pericardial effusion     a. Since 2007. b. s/p pericardial window 04/2013.  Marland Kitchen Echocardiogram abnormal     EF60%, mild LVH, PA pk pressure 35, pericardial effusion mild increase from 2012   . Complication of anesthesia     Pt has anxiety issues and thinks he is going to die.  . Liver mass     a. CT 03/2013: Small probable cysts in the liver and small hyperenhancing liver mass.   Past Surgical History  Procedure Laterality Date  .  Cardiac catheterization  08/25/10    NO SIGNIFICANT CAD  . Cardioversion  10/18/2007    successful  . Elbow / upper arm foreign body removal    . Transesophageal echocardiogram  08/26/2010    see Notes tab  . Cardiovascular stress test  09/08/2005    R/S MV - EF 61%; Exercises capacity 12 Mets; essentially normal perfusion myocardial scan    . Cardiac electrophysiology mapping and ablation  [id number]    Dr. Tally Due  . Osteosynthesis Left 09/30/2012    with small plate in the ulna  . Cardioversion N/A 02/08/2013    Procedure: CARDIOVERSION;  Surgeon: Troy Sine, MD;  Location: [facility] ENDOSCOPY;  Service: Cardiovascular;  Laterality: N/A;  . Fracture surgery Left     Left wrist  . Subxyphoid pericardial window N/A 04/25/2013    Procedure: SUBXYPHOID PERICARDIAL WINDOW;  Surgeon: Gaye Pollack, MD;  Location: [place] OR;  Service: Thoracic;  Laterality: N/A;  . Carpal tunnel release Left April 2015  . Cardioversion N/A 08/17/2013    Procedure: CARDIOVERSION;  Surgeon: Sanda Klein, MD;  Location: [place];  Service: Cardiovascular;  Laterality: N/A;  . Cardioversion N/A 12/28/2013    Procedure: CARDIOVERSION;  Surgeon: Fay Records, MD;  Location: [place];  Service: Cardiovascular;  Laterality: N/A;  . Cardioversion N/A 01/19/2014  Procedure: CARDIOVERSION;  Surgeon: Sanda Klein, MD;  Location: [facility] ENDOSCOPY;  Service: Cardiovascular;  Laterality: N/A;  . Cardioversion N/A 03/11/2014    Procedure: CARDIOVERSION;  Surgeon: Evans Lance, MD;  Location: [place];  Service: Cardiovascular;  Laterality: N/A;  . Left and right heart catheterization with coronary angiogram N/A 03/17/2013    Procedure: LEFT AND RIGHT HEART CATHETERIZATION WITH CORONARY ANGIOGRAM;  Surgeon: Troy Sine, MD;  Location: [facility] CATH LAB;  Service: Cardiovascular;  Laterality: N/A;     Current Outpatient Prescriptions  Medication Sig Dispense Refill  . atorvastatin (LIPITOR) 40 MG tablet Take 1 tablet (40 mg  total) by mouth daily. 90 tablet 3  . Cholecalciferol (VITAMIN D) 2000 UNITS CAPS Take 2,000 Units by mouth daily.    . dabigatran (PRADAXA) 150 MG CAPS capsule Take 1 capsule (150 mg total) by mouth 2 (two) times daily. 180 capsule 3  . diltiazem (CARDIZEM CD) 240 MG 24 hr capsule Take 1 capsule (240 mg total) by mouth daily. 90 capsule 3  . dofetilide (TIKOSYN) 500 MCG capsule Take 1 capsule (500 mcg total) by mouth 2 (two) times daily. 180 capsule 4  . finasteride (PROPECIA) 1 MG tablet Take 1 mg by mouth every morning.     . furosemide (LASIX) 40 MG tablet Take 1 tablet (40 mg total) by mouth daily. 90 tablet 3  . metoprolol succinate (TOPROL-XL) 100 MG 24 hr tablet Take 1 tablet (100 mg total) by mouth 2 (two) times daily. Take with or immediately following a meal. 180 tablet 3  . NON FORMULARY Take 1 capsule by mouth 2 (two) times daily. Urinozinc    . pantoprazole (PROTONIX) 40 MG tablet Take 1 tablet (40 mg total) by mouth at bedtime. 90 tablet 3  . potassium chloride SA (K-DUR,KLOR-CON) 20 MEQ tablet Take 1 1/2 tablets (30mg ) by mouth in the morning    . Saw Palmetto, Serenoa repens, (SAW PALMETTO PO) Take 600 mg by mouth 2 (two) times daily.      No current facility-administered medications for this visit.    Allergies:   Tetracyclines & related   Social History:  The patient  reports that he quit smoking about 25 years ago. He has never used smokeless tobacco. He reports that he drinks alcohol. He reports that he does not use illicit drugs.   Family History:  The patient's  family history includes Atrial fibrillation in his brother; Coronary artery disease in his father; Heart attack in his father; Mitral valve prolapse in his sister; Stroke in his mother.    ROS:  Please see the history of present illness.   All other systems are reviewed and negative.    PHYSICAL EXAM: VS:  BP 118/72 mmHg  Pulse 62  Ht 6\' 2"  (1.88 m)  Wt 204 lb 12.8 oz (92.897 kg)  BMI 26.28 kg/m2 , BMI  Body mass index is 26.28 kg/(m^2). GEN: Well nourished, well developed, in no acute distress HEENT: normal Neck: no JVD, carotid bruits, or masses Cardiac: RRR; no murmurs, rubs, or gallops,no edema  Respiratory:  clear to auscultation bilaterally, normal work of breathing GI: soft, nontender, nondistended, + BS MS: no deformity or atrophy Skin: warm and dry  Neuro:  Strength and sensation are intact Psych: euthymic mood, full affect  EKG:  EKG is ordered today. The ekg ordered today shows sinus rhythm 62 bpm, PR 212, septal infarct, otherwise normal ekg Recent Labs: 06/11/2014: BUN 20; Creatinine 1.18; Hemoglobin 13.6; Magnesium 2.3; Platelets 247.0;  Potassium 3.9; Sodium 138    Lipid Panel     Component Value Date/Time   CHOL  08/23/2007 0910    188        ATP III CLASSIFICATION:  <200     mg/dL   Desirable  200-239  mg/dL   Borderline High  >=240    mg/dL   High   TRIG 63 08/23/2007 0910   HDL 47 08/23/2007 0910   CHOLHDL 4.0 08/23/2007 0910   VLDL 13 08/23/2007 0910   LDLCALC * 08/23/2007 0910    128        Total Cholesterol/HDL:CHD Risk Coronary Heart Disease Risk Table                     Men   Women  1/2 Average Risk   3.4   3.3     Wt Readings from Last 3 Encounters:  08/13/14 204 lb 12.8 oz (92.897 kg)  06/11/14 206 lb 12.8 oz (93.804 kg)  03/09/14 212 lb 4.9 oz (96.3 kg)     ASSESSMENT AND PLAN:  1. Persistent afib He continues to struggle with symptomatic persistent afib.  He is s/p prior ablation at [facility] in 2002 and 2009. Therapeutic strategies for afib including medicine and ablation were discussed in detail with the patient today. Risk, benefits, and alternatives to EP study and radiofrequency ablation for afib were also discussed in detail today. These risks include but are not limited to stroke, bleeding, vascular damage, tamponade, perforation, damage to the esophagus, lungs, and other structures, pulmonary vein stenosis, worsening renal function, and  death. The patient understands these risk and wishes to proceed.  We will therefore proceed with catheter ablation.    I would likely plan for a cardiac CT prior to ablation to evaluate for pulmonary vein stenosis from prior ablations at [facility]. Continue current medicines at this time.     2. Shortness of breath/ edema Resolved with sinus rhythm  3. OSA Compliance with CPAP is encouraged.    4. HTN Stable  Current medicines are reviewed at length with the patient today.   The patient does not have concerns regarding his medicines.  The following changes were made today:  none  Labs/ tests ordered today include:  Orders Placed This Encounter  Procedures  . Basic metabolic panel  . CBC with Differential/Platelet  . EKG 12-Lead       Signed, Thompson Grayer, MD  08/13/2014 5:18 PM     [place] [address] [phone] (office) [phone] (fax)

## 2014-08-18 ENCOUNTER — Telehealth: Payer: Self-pay | Admitting: Internal Medicine

## 2014-08-18 NOTE — Telephone Encounter (Signed)
[name] called and said he went into atrial fibrillation overnight tonight. He just saw Dr. Rayann Heman. He feels ok but kind of "crummy" like he always feels in atrial fibrillation. He says he typically needs a cardioversion to get out of atrial fibrillation. He is stable with controlled HR and blood pressure. We discussed ER warnings - chest pain, dizziness or any other concerns to come to ER/call 911. He will call the office first thing Monday Monday as well. He has a scheduled ablation in June with Dr. Rayann Heman.   Jules Husbands, MD

## 2014-08-20 ENCOUNTER — Encounter: Payer: Self-pay | Admitting: Internal Medicine

## 2014-08-21 ENCOUNTER — Other Ambulatory Visit: Payer: Self-pay | Admitting: *Deleted

## 2014-08-21 DIAGNOSIS — I480 Paroxysmal atrial fibrillation: Secondary | ICD-10-CM

## 2014-08-23 ENCOUNTER — Encounter (HOSPITAL_COMMUNITY): Payer: Self-pay | Admitting: Anesthesiology

## 2014-08-23 ENCOUNTER — Encounter (HOSPITAL_COMMUNITY)
Admission: RE | Disposition: A | Discharge status: Home / Self Care | Payer: Self-pay | Source: Ambulatory Visit | Attending: Cardiology

## 2014-08-23 ENCOUNTER — Ambulatory Visit (HOSPITAL_COMMUNITY): Payer: Medicare Other | Admitting: Anesthesiology

## 2014-08-23 ENCOUNTER — Ambulatory Visit (HOSPITAL_COMMUNITY)
Admission: RE | Admit: 2014-08-23 | Discharge: 2014-08-23 | Disposition: A | Discharge status: Home / Self Care | Payer: Medicare Other | Source: Ambulatory Visit | Attending: Cardiology | Admitting: Cardiology

## 2014-08-23 DIAGNOSIS — I480 Paroxysmal atrial fibrillation: Secondary | ICD-10-CM | POA: Insufficient documentation

## 2014-08-23 DIAGNOSIS — I712 Thoracic aortic aneurysm, without rupture: Secondary | ICD-10-CM | POA: Insufficient documentation

## 2014-08-23 DIAGNOSIS — G4730 Sleep apnea, unspecified: Secondary | ICD-10-CM | POA: Diagnosis not present

## 2014-08-23 DIAGNOSIS — Z888 Allergy status to other drugs, medicaments and biological substances status: Secondary | ICD-10-CM | POA: Insufficient documentation

## 2014-08-23 DIAGNOSIS — I429 Cardiomyopathy, unspecified: Secondary | ICD-10-CM | POA: Insufficient documentation

## 2014-08-23 DIAGNOSIS — F329 Major depressive disorder, single episode, unspecified: Secondary | ICD-10-CM | POA: Insufficient documentation

## 2014-08-23 DIAGNOSIS — Z87891 Personal history of nicotine dependence: Secondary | ICD-10-CM | POA: Insufficient documentation

## 2014-08-23 DIAGNOSIS — I10 Essential (primary) hypertension: Secondary | ICD-10-CM | POA: Insufficient documentation

## 2014-08-23 DIAGNOSIS — G4733 Obstructive sleep apnea (adult) (pediatric): Secondary | ICD-10-CM | POA: Diagnosis not present

## 2014-08-23 DIAGNOSIS — Z9861 Coronary angioplasty status: Secondary | ICD-10-CM | POA: Diagnosis not present

## 2014-08-23 DIAGNOSIS — I4891 Unspecified atrial fibrillation: Secondary | ICD-10-CM | POA: Diagnosis present

## 2014-08-23 HISTORY — PX: CARDIOVERSION: SHX1299

## 2014-08-23 LAB — POCT I-STAT 4, (NA,K, GLUC, HGB,HCT)
GLUCOSE: 97 mg/dL (ref 65–99)
HEMATOCRIT: 43 % (ref 39.0–52.0)
Hemoglobin: 14.6 g/dL (ref 13.0–17.0)
Potassium: 3.7 mmol/L (ref 3.5–5.1)
Sodium: 139 mmol/L (ref 135–145)

## 2014-08-23 SURGERY — CARDIOVERSION
Anesthesia: Monitor Anesthesia Care

## 2014-08-23 MED ORDER — PROPOFOL 10 MG/ML IV BOLUS
INTRAVENOUS | Status: DC | PRN
  Administered 2014-08-23: 40 mg via INTRAVENOUS

## 2014-08-23 MED ORDER — SODIUM CHLORIDE 0.9 % IV SOLN
INTRAVENOUS | Status: DC
  Administered 2014-08-23: 500 mL via INTRAVENOUS

## 2014-08-23 MED ORDER — LIDOCAINE HCL (CARDIAC) 20 MG/ML IV SOLN
INTRAVENOUS | Status: DC | PRN
  Administered 2014-08-23: 40 mg via INTRAVENOUS

## 2014-08-23 NOTE — H&P (Signed)
[name] [name]  06/11/2014 9:45 AM  Office Visit  MRN:  [id number]   Description: Male DOB: Aug 04, 1945  Provider: Thompson Grayer, MD  Department: [address] Office       Vital Signs  Most recent update: 06/11/2014 9:59 AM by Jordan Likes, CMA    BP Pulse Ht Wt BMI    118/78 mmHg 66 6\' 2"  (1.88 m) 206 lb 12.8 oz (93.804 kg) 26.54 kg/m2    Vitals History     Progress Notes      Thompson Grayer, MD at 06/11/2014 11:21 PM     Status: Signed       Expand All Collapse All      Electrophysiology Office Note   Date: 06/11/2014   ID: [name] [name], DOB 12/04/1945, MRN [id number]  PCP: [name], MD Cardiologist: Dr Claiborne Billings Primary Electrophysiologist: Thompson Grayer, MD   Chief Complaint  Patient presents with  . Follow-up    Persistent AFIB    History of Present Illness: [name] [name] is a 69 y.o. male who presents today for electrophysiology evaluation. SInce I saw him last, he returned to afib while in [place]. He required cardioversion. He is unaware of triggers for the episode. He has had no alcohol. He did not well tolerate afib. He reports compliance with medicine. His SOB is improved.  Today, he denies symptoms of palpitations, chest pain, orthopnea, PND, lower extremity edema, claudication, dizziness, presyncope, syncope, bleeding, or neurologic sequela. The patient is tolerating medications without difficulties and is otherwise without complaint today.    Past Medical History  Diagnosis Date  . Paroxysmal atrial fibrillation     a. s/p afib ablations 2002 and 2009 by Dr Rolland Porter. b. s/p DCCV 02/2013. c. s/p DCCV 01/19/2014  . H/O cardiac catheterization     no significant CAD by cath 5/12  . Cardiomyopathy     a. 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 b. also hx of tachycardia mediated CM, now resolved  . Hypertension   . Hyperlipidemia   . Thoracic aortic aneurysm      a. Borderline enlarged by CT 12/14 at 4.0cm.  . Depression   . OSA (obstructive sleep apnea)     AHI avg 5/hr  . Limb pain 06/20/2007    LE venous doppler - no evidence of thrombus or thrombophlebitis  . PVC (premature ventricular contraction) 12/18/2009    cardionet monitor 21 days - some PVCs, ventricular bigeminy  . Pericardial effusion     a. Since 2007. b. s/p pericardial window 04/2013.  Marland Kitchen Echocardiogram abnormal     EF60%, mild LVH, PA pk pressure 35, pericardial effusion mild increase from 2012   . Complication of anesthesia     Pt has anxiety issues and thinks he is going to die.  . Liver mass     a. CT 03/2013: Small probable cysts in the liver and small hyperenhancing liver mass.   Past Surgical History  Procedure Laterality Date  . Cardiac catheterization  08/25/10    NO SIGNIFICANT CAD  . Cardioversion  10/18/2007    successful  . Elbow / upper arm foreign body removal    . Transesophageal echocardiogram  08/26/2010    see Notes tab  . Cardiovascular stress test  09/08/2005    R/S MV - EF 61%; Exercises capacity 12 Mets; essentially normal perfusion myocardial scan   . Cardiac electrophysiology mapping and ablation  [id number]    Dr. Tally Due  . Osteosynthesis Left 09/30/2012  with small plate in the ulna  . Cardioversion N/A 02/08/2013    Procedure: CARDIOVERSION; Surgeon: Troy Sine, MD; Location: [facility] ENDOSCOPY; Service: Cardiovascular; Laterality: N/A;  . Fracture surgery Left     Left wrist  . Subxyphoid pericardial window N/A 04/25/2013    Procedure: SUBXYPHOID PERICARDIAL WINDOW; Surgeon: Gaye Pollack, MD; Location: [place] OR; Service: Thoracic; Laterality: N/A;  . Carpal tunnel release Left April 2015  . Cardioversion N/A 08/17/2013    Procedure: CARDIOVERSION; Surgeon: Sanda Klein, MD; Location: [place]; Service:  Cardiovascular; Laterality: N/A;  . Cardioversion N/A 12/28/2013    Procedure: CARDIOVERSION; Surgeon: Fay Records, MD; Location: [place]; Service: Cardiovascular; Laterality: N/A;  . Cardioversion N/A 01/19/2014    Procedure: CARDIOVERSION; Surgeon: Sanda Klein, MD; Location: [facility] ENDOSCOPY; Service: Cardiovascular; Laterality: N/A;  . Cardioversion N/A 03/11/2014    Procedure: CARDIOVERSION; Surgeon: Evans Lance, MD; Location: [place]; Service: Cardiovascular; Laterality: N/A;  . Left and right heart catheterization with coronary angiogram N/A 03/17/2013    Procedure: LEFT AND RIGHT HEART CATHETERIZATION WITH CORONARY ANGIOGRAM; Surgeon: Troy Sine, MD; Location: [facility] CATH LAB; Service: Cardiovascular; Laterality: N/A;     Current Outpatient Prescriptions  Medication Sig Dispense Refill  . atorvastatin (LIPITOR) 40 MG tablet Take 1 tablet (40 mg total) by mouth daily. 90 tablet 3  . Cholecalciferol (VITAMIN D) 2000 UNITS CAPS Take 2,000 Units by mouth daily.    . dabigatran (PRADAXA) 150 MG CAPS capsule Take 1 capsule (150 mg total) by mouth 2 (two) times daily. 180 capsule 3  . diltiazem (CARDIZEM CD) 240 MG 24 hr capsule Take 1 capsule (240 mg total) by mouth daily. 90 capsule 3  . dofetilide (TIKOSYN) 500 MCG capsule Take 1 capsule (500 mcg total) by mouth 2 (two) times daily. 180 capsule 4  . finasteride (PROPECIA) 1 MG tablet Take 1 mg by mouth every morning.     . furosemide (LASIX) 40 MG tablet Take 1 tablet (40 mg total) by mouth daily. 90 tablet 3  . metoprolol succinate (TOPROL-XL) 100 MG 24 hr tablet Take 1 tablet (100 mg total) by mouth 2 (two) times daily. Take with or immediately following a meal. 180 tablet 3  . pantoprazole (PROTONIX) 40 MG tablet Take 1 tablet (40 mg total) by mouth at bedtime. 90 tablet 3  . potassium chloride SA (K-DUR,KLOR-CON) 20 MEQ tablet Take 1  1/2 tablets (30mg ) by mouth in the morning    . Saw Palmetto, Serenoa repens, (SAW PALMETTO PO) Take 900 mg by mouth 2 (two) times daily.      No current facility-administered medications for this visit.    Allergies: Tetracyclines & related   Social History: The patient  reports that he quit smoking about 25 years ago. He has never used smokeless tobacco. He reports that he drinks alcohol. He reports that he does not use illicit drugs.   Family History: The patient's family history includes Atrial fibrillation in his brother; Coronary artery disease in his father; Heart attack in his father; Mitral valve prolapse in his sister; Stroke in his mother.    ROS: Please see the history of present illness. All other systems are reviewed and negative.    PHYSICAL EXAM: VS: BP 118/78 mmHg  Pulse 66  Ht 6\' 2"  (1.88 m)  Wt 206 lb 12.8 oz (93.804 kg)  BMI 26.54 kg/m2 , BMI Body mass index is 26.54 kg/(m^2). GEN: Well nourished, well developed, in no acute distress  HEENT: normal  Neck:  no JVD, carotid bruits, or masses Cardiac: RRR; no murmurs, rubs, or gallops,no edema  Respiratory: clear to auscultation bilaterally, normal work of breathing GI: soft, nontender, nondistended, + BS MS: no deformity or atrophy  Skin: warm and dry  Neuro: Strength and sensation are intact Psych: euthymic mood, full affect  EKG: EKG is ordered today. The ekg ordered today shows sinus rhythm 66 bpm, PR 218, septal infarct, otherwise normal ekg   Recent Labs: 06/11/2014: BUN 20; Creatinine 1.18; Hemoglobin 13.6; Magnesium 2.3; Platelets 247.0; Potassium 3.9; Sodium 138    Lipid Panel   Labs (Brief)       Component Value Date/Time   CHOL  08/23/2007 0910    188  ATP III CLASSIFICATION: <200 mg/dL Desirable 200-239 mg/dL Borderline High >=240 mg/dL High   TRIG 63 08/23/2007 0910   HDL 47 08/23/2007 0910   CHOLHDL 4.0 08/23/2007  0910   VLDL 13 08/23/2007 0910   LDLCALC * 08/23/2007 0910    128  Total Cholesterol/HDL:CHD Risk Coronary Heart Disease Risk Table  Men Women 1/2 Average Risk 3.4 3.3       Wt Readings from Last 3 Encounters:  06/11/14 206 lb 12.8 oz (93.804 kg)  03/09/14 212 lb 4.9 oz (96.3 kg)  01/24/14 214 lb (97.07 kg)     ASSESSMENT AND PLAN:  1. Persistent afib He continues to struggle with symptomatic persistent afib. He is s/p prior ablation at [facility] in 2002 and 2009. Therapeutic strategies for afib including medicine and ablation were discussed in detail with the patient today. Risk, benefits, and alternatives to EP study and repeat radiofrequency ablation for afib were also discussed in detail today. The patient would like to consider repeat ablation this summer. He will return in 2 months to discuss this further. I would likely plan for a cardiac CT prior to ablation to evaluate for pulmonary vein stenosis from prior ablations at [facility]. Continue current medicines at this time. BMET, MG on tikosyn. CBC today on pradaxa.  2. Shortness of breath/ edema Resolved with sinus rhythm  3. OSA Compliance with CPAP is encouraged.   4. HTN Stable  Current medicines are reviewed at length with the patient today.  The patient does not have concerns regarding his medicines. The following changes were made today: none  Labs/ tests ordered today include:  Orders Placed This Encounter  Procedures  . CBC with Differential  . Basic metabolic panel  . Magnesium  . EKG 12-Lead    Follow-up: return to see me in 2 months to discuss possible ablation   Signed, Thompson Grayer, MD  06/11/2014 11:21 PM   [place] [address] [place] [address] [phone] (office) [phone] (fax)       Patient developed recurrent atrial fibrillation and DCCV arranged by Dr Rayann Heman today. He has  been taking pradaxa. Kirk Ruths

## 2014-08-23 NOTE — Discharge Instructions (Signed)
Electrical Cardioversion, Care After °Refer to this sheet in the next few weeks. These instructions provide you with information on caring for yourself after your procedure. Your health care provider may also give you more specific instructions. Your treatment has been planned according to current medical practices, but problems sometimes occur. Call your health care provider if you have any problems or questions after your procedure. °WHAT TO EXPECT AFTER THE PROCEDURE °After your procedure, it is typical to have the following sensations: °· Some redness on the skin where the shocks were delivered. If this is tender, a sunburn lotion or hydrocortisone cream may help. °· Possible return of an abnormal heart rhythm within hours or days after the procedure. °HOME CARE INSTRUCTIONS °· Take medicines only as directed by your health care provider. Be sure you understand how and when to take your medicine. °· Learn how to feel your pulse and check it often. °· Limit your activity for 48 hours after the procedure or as directed by your health care provider. °· Avoid or minimize caffeine and other stimulants as directed by your health care provider. °SEEK MEDICAL CARE IF: °· You feel like your heart is beating too fast or your pulse is not regular. °· You have any questions about your medicines. °· You have bleeding that will not stop. °SEEK IMMEDIATE MEDICAL CARE IF: °· You are dizzy or feel faint. °· It is hard to breathe or you feel short of breath. °· There is a change in discomfort in your chest. °· Your speech is slurred or you have trouble moving an arm or leg on one side of your body. °· You get a serious muscle cramp that does not go away. °· Your fingers or toes turn cold or blue. °Document Released: 01/11/2013 Document Revised: 08/07/2013 Document Reviewed: 01/11/2013 °Exit[employer]® Patient Information ©2015 [employer]. This information is not intended to replace advice given to you by your health care provider.  Make sure you discuss any questions you have with your health care provider. ° ° °Conscious Sedation, Adult, Care After °Refer to this sheet in the next few weeks. These instructions provide you with information on caring for yourself after your procedure. Your health care provider may also give you more specific instructions. Your treatment has been planned according to current medical practices, but problems sometimes occur. Call your health care provider if you have any problems or questions after your procedure. °WHAT TO EXPECT AFTER THE PROCEDURE  °After your procedure: °· You may feel sleepy, clumsy, and have poor balance for several hours. °· Vomiting may occur if you eat too soon after the procedure. °HOME CARE INSTRUCTIONS °· Do not participate in any activities where you could become injured for at least 24 hours. Do not: °¨ Drive. °¨ Swim. °¨ Ride a bicycle. °¨ Operate heavy machinery. °¨ Cook. °¨ Use power tools. °¨ Climb ladders. °¨ Work from a high place. °· Do not make important decisions or sign legal documents until you are improved. °· If you vomit, drink water, juice, or soup when you can drink without vomiting. Make sure you have little or no nausea before eating solid foods. °· Only take over-the-counter or prescription medicines for pain, discomfort, or fever as directed by your health care provider. °· Make sure you and your family fully understand everything about the medicines given to you, including what side effects may occur. °· You should not drink alcohol, take sleeping pills, or take medicines that cause drowsiness for at least 24   hours. °· If you smoke, do not smoke without supervision. °· If you are feeling better, you may resume normal activities 24 hours after you were sedated. °· Keep all appointments with your health care provider. °SEEK MEDICAL CARE IF: °· Your skin is pale or bluish in color. °· You continue to feel nauseous or vomit. °· Your pain is getting worse and is not  helped by medicine. °· You have bleeding or swelling. °· You are still sleepy or feeling clumsy after 24 hours. °SEEK IMMEDIATE MEDICAL CARE IF: °· You develop a rash. °· You have difficulty breathing. °· You develop any type of allergic problem. °· You have a fever. °MAKE SURE YOU: °· Understand these instructions. °· Will watch your condition. °· Will get help right away if you are not doing well or get worse. °Document Released: 01/11/2013 Document Reviewed: 01/11/2013 °Exit[employer]® Patient Information ©2015 [employer]. This information is not intended to replace advice given to you by your health care provider. Make sure you discuss any questions you have with your health care provider. ° °

## 2014-08-23 NOTE — Transfer of Care (Signed)
Immediate Anesthesia Transfer of Care Note  Patient: [name] [name]  Procedure(s) Performed: Procedure(s): CARDIOVERSION (N/A)  Patient Location: PACU  Anesthesia Type:MAC  Level of Consciousness: awake, alert , oriented and patient cooperative  Airway & Oxygen Therapy: Patient Spontanous Breathing and Patient connected to nasal cannula oxygen  Post-op Assessment: Report given to RN, Post -op Vital signs reviewed and stable and Patient moving all extremities  Post vital signs: Stable   Last Vitals:  Filed Vitals:   08/23/14 1257  BP:   Pulse:   Temp:   Resp: 16    Complications: No apparent anesthesia complications

## 2014-08-23 NOTE — Procedures (Signed)
Electrical Cardioversion Procedure Note [place] FRIEDEN [id number] 07-04-1945  Procedure: Electrical Cardioversion Indications:  Atrial Fibrillation  Procedure Details Consent: Risks of procedure as well as the alternatives and risks of each were explained to the (patient/caregiver).  Consent for procedure obtained. Time Out: Verified patient identification, verified procedure, site/side was marked, verified correct patient position, special equipment/implants available, medications/allergies/relevent history reviewed, required imaging and test results available.  Performed  Patient placed on cardiac monitor, pulse oximetry, supplemental oxygen as necessary.  Sedation given: Patient sedated by anesthesia with lidocaine 40 mg and diprovan 80 mg IV. Pacer pads placed anterior and posterior chest.  Cardioverted 1 time(s).  Cardioverted at 120J.  Evaluation Findings: Post procedure EKG shows: NSR Complications: None Patient did tolerate procedure well.   Kirk Ruths 08/23/2014, 12:32 PM

## 2014-08-23 NOTE — Anesthesia Postprocedure Evaluation (Signed)
  Anesthesia Post-op Note  Patient: [name] [name]  Procedure(s) Performed: Procedure(s): CARDIOVERSION (N/A)  Patient Location: PACU  Anesthesia Type:MAC  Level of Consciousness: awake  Airway and Oxygen Therapy: Patient Spontanous Breathing  Post-op Pain: mild  Post-op Assessment: Post-op Vital signs reviewed  Post-op Vital Signs: Reviewed  Last Vitals:  Filed Vitals:   08/23/14 1310  BP: 110/67  Pulse: 62  Temp:   Resp: 20    Complications: No apparent anesthesia complications

## 2014-08-23 NOTE — Anesthesia Postprocedure Evaluation (Signed)
  Anesthesia Post-op Note  Patient: [name] [name]  Procedure(s) Performed: Procedure(s): CARDIOVERSION (N/A)  Patient Location: PACU  Anesthesia Type:MAC  Level of Consciousness: awake, alert , oriented and patient cooperative  Airway and Oxygen Therapy: Patient Spontanous Breathing and Patient connected to nasal cannula oxygen  Post-op Pain: none  Post-op Assessment: Post-op Vital signs reviewed, Patient's Cardiovascular Status Stable, Respiratory Function Stable, Patent Airway and No signs of Nausea or vomiting  Post-op Vital Signs: Reviewed and stable  Last Vitals:  Filed Vitals:   08/23/14 1257  BP:   Pulse:   Temp:   Resp: 16    Complications: No apparent anesthesia complications

## 2014-08-23 NOTE — Anesthesia Preprocedure Evaluation (Addendum)
Anesthesia Evaluation  Patient identified by MRN, date of birth, ID band Patient awake    Reviewed: Allergy & Precautions, NPO status , Patient's Chart, lab work & pertinent test results, reviewed documented beta blocker date and time , Unable to perform ROS - Chart review only  Airway Mallampati: II  TM Distance: >3 FB Neck ROM: Full    Dental  (+) Teeth Intact   Pulmonary sleep apnea , former smoker,  breath sounds clear to auscultation        Cardiovascular hypertension, Pt. on medications + Peripheral Vascular Disease Rhythm:Irregular Rate:Normal     Neuro/Psych    GI/Hepatic negative GI ROS, Neg liver ROS,   Endo/Other  negative endocrine ROS  Renal/GU negative Renal ROS     Musculoskeletal   Abdominal   Peds  Hematology   Anesthesia Other Findings   Reproductive/Obstetrics                            Anesthesia Physical Anesthesia Plan  ASA: III  Anesthesia Plan: MAC   Post-op Pain Management:    Induction: Intravenous  Airway Management Planned: Simple Face Mask  Additional Equipment:   Intra-op Plan:   Post-operative Plan:   Informed Consent: I have reviewed the patients History and Physical, chart, labs and discussed the procedure including the risks, benefits and alternatives for the proposed anesthesia with the patient or authorized representative who has indicated his/her understanding and acceptance.   Dental advisory given  Plan Discussed with: CRNA, Anesthesiologist and Surgeon  Anesthesia Plan Comments:         Anesthesia Quick Evaluation

## 2014-08-24 ENCOUNTER — Encounter (HOSPITAL_COMMUNITY): Payer: Self-pay | Admitting: Anesthesiology

## 2014-08-24 ENCOUNTER — Encounter (HOSPITAL_COMMUNITY): Payer: Self-pay | Admitting: Cardiology

## 2014-08-25 ENCOUNTER — Encounter (HOSPITAL_COMMUNITY): Payer: Self-pay | Admitting: Cardiology

## 2014-09-01 ENCOUNTER — Encounter: Payer: Self-pay | Admitting: Internal Medicine

## 2014-09-05 DIAGNOSIS — Z961 Presence of intraocular lens: Secondary | ICD-10-CM | POA: Insufficient documentation

## 2014-09-09 ENCOUNTER — Encounter: Payer: Self-pay | Admitting: Internal Medicine

## 2014-09-12 ENCOUNTER — Telehealth: Payer: Self-pay | Admitting: Internal Medicine

## 2014-09-12 ENCOUNTER — Encounter: Payer: Self-pay | Admitting: Internal Medicine

## 2014-09-12 NOTE — Telephone Encounter (Signed)
New Prob  Pt calling regarding message he left 3 days ago and medication mix up. Requesting a call back or a response on MyChart.

## 2014-09-12 NOTE — Telephone Encounter (Signed)
See my chart message.  Okay to proceed with ablation

## 2014-09-17 ENCOUNTER — Telehealth: Payer: Self-pay | Admitting: Internal Medicine

## 2014-09-17 DIAGNOSIS — H26493 Other secondary cataract, bilateral: Secondary | ICD-10-CM | POA: Insufficient documentation

## 2014-09-17 NOTE — Telephone Encounter (Signed)
Pt calling re TEE 09-24-14-states he has not gotten any info on it from [place] or mychart-needs to know where to go and what time pls call [phone] and requests message on mychart

## 2014-09-17 NOTE — Telephone Encounter (Signed)
Called patient back about his instructions for his TEE on 09/24/2014 and Ablation on 09/25/2014. Patient was given instructions according to his letter written on 08/13/2014. Patient verbalized understanding and reminded him of his labs and CT appointments tomorrow.

## 2014-09-18 ENCOUNTER — Encounter: Payer: Self-pay | Admitting: Internal Medicine

## 2014-09-18 ENCOUNTER — Other Ambulatory Visit (INDEPENDENT_AMBULATORY_CARE_PROVIDER_SITE_OTHER): Payer: Medicare Other | Admitting: *Deleted

## 2014-09-18 ENCOUNTER — Ambulatory Visit (HOSPITAL_COMMUNITY)
Admission: RE | Admit: 2014-09-18 | Discharge: 2014-09-18 | Disposition: A | Discharge status: Home / Self Care | Payer: Medicare Other | Source: Ambulatory Visit | Attending: Internal Medicine | Admitting: Internal Medicine

## 2014-09-18 DIAGNOSIS — I480 Paroxysmal atrial fibrillation: Secondary | ICD-10-CM

## 2014-09-18 DIAGNOSIS — I481 Persistent atrial fibrillation: Secondary | ICD-10-CM | POA: Insufficient documentation

## 2014-09-18 DIAGNOSIS — I4819 Other persistent atrial fibrillation: Secondary | ICD-10-CM

## 2014-09-18 DIAGNOSIS — R079 Chest pain, unspecified: Secondary | ICD-10-CM | POA: Diagnosis not present

## 2014-09-18 LAB — CBC WITH DIFFERENTIAL/PLATELET
BASOS PCT: 0.6 % (ref 0.0–3.0)
Basophils Absolute: 0 10*3/uL (ref 0.0–0.1)
EOS PCT: 1.8 % (ref 0.0–5.0)
Eosinophils Absolute: 0.1 10*3/uL (ref 0.0–0.7)
HCT: 39.2 % (ref 39.0–52.0)
Hemoglobin: 12.9 g/dL — ABNORMAL LOW (ref 13.0–17.0)
Lymphocytes Relative: 32.1 % (ref 12.0–46.0)
Lymphs Abs: 2.1 10*3/uL (ref 0.7–4.0)
MCHC: 33 g/dL (ref 30.0–36.0)
MCV: 87 fl (ref 78.0–100.0)
Monocytes Absolute: 0.7 10*3/uL (ref 0.1–1.0)
Monocytes Relative: 10.3 % (ref 3.0–12.0)
NEUTROS PCT: 55.2 % (ref 43.0–77.0)
Neutro Abs: 3.5 10*3/uL (ref 1.4–7.7)
Platelets: 203 10*3/uL (ref 150.0–400.0)
RBC: 4.51 Mil/uL (ref 4.22–5.81)
RDW: 13.6 % (ref 11.5–15.5)
WBC: 6.4 10*3/uL (ref 4.0–10.5)

## 2014-09-18 LAB — BASIC METABOLIC PANEL
BUN: 19 mg/dL (ref 6–23)
CHLORIDE: 103 meq/L (ref 96–112)
CO2: 27 mEq/L (ref 19–32)
CREATININE: 0.97 mg/dL (ref 0.40–1.50)
Calcium: 9.5 mg/dL (ref 8.4–10.5)
GFR: 81.8 mL/min (ref >60.00)
GLUCOSE: 90 mg/dL (ref 70–99)
Potassium: 3.6 mEq/L (ref 3.5–5.1)
Sodium: 137 mEq/L (ref 135–145)

## 2014-09-18 MED ORDER — IOHEXOL 350 MG/ML SOLN
80.0000 mL | Freq: Once | INTRAVENOUS | Status: AC | PRN
  Administered 2014-09-18: 100 mL via INTRAVENOUS

## 2014-09-18 NOTE — Addendum Note (Signed)
Addended by: Eulis Foster on: 09/18/2014 09:33 AM   Modules accepted: Orders

## 2014-09-18 NOTE — Addendum Note (Signed)
Addended by: Eulis Foster on: 09/18/2014 09:34 AM   Modules accepted: Orders

## 2014-09-24 ENCOUNTER — Encounter (HOSPITAL_COMMUNITY): Payer: Self-pay | Admitting: *Deleted

## 2014-09-24 ENCOUNTER — Encounter (HOSPITAL_COMMUNITY)
Admission: RE | Disposition: A | Discharge status: Home / Self Care | Payer: Self-pay | Source: Ambulatory Visit | Attending: Cardiology

## 2014-09-24 ENCOUNTER — Ambulatory Visit (HOSPITAL_BASED_OUTPATIENT_CLINIC_OR_DEPARTMENT_OTHER): Payer: Medicare Other

## 2014-09-24 ENCOUNTER — Ambulatory Visit (HOSPITAL_COMMUNITY)
Admission: RE | Admit: 2014-09-24 | Discharge: 2014-09-24 | Disposition: A | Discharge status: Home / Self Care | Payer: Medicare Other | Source: Ambulatory Visit | Attending: Cardiology | Admitting: Cardiology

## 2014-09-24 DIAGNOSIS — I481 Persistent atrial fibrillation: Secondary | ICD-10-CM | POA: Diagnosis not present

## 2014-09-24 DIAGNOSIS — I4891 Unspecified atrial fibrillation: Secondary | ICD-10-CM | POA: Diagnosis present

## 2014-09-24 DIAGNOSIS — E785 Hyperlipidemia, unspecified: Secondary | ICD-10-CM | POA: Insufficient documentation

## 2014-09-24 DIAGNOSIS — I083 Combined rheumatic disorders of mitral, aortic and tricuspid valves: Secondary | ICD-10-CM | POA: Diagnosis not present

## 2014-09-24 DIAGNOSIS — Z87891 Personal history of nicotine dependence: Secondary | ICD-10-CM | POA: Insufficient documentation

## 2014-09-24 DIAGNOSIS — I10 Essential (primary) hypertension: Secondary | ICD-10-CM | POA: Diagnosis not present

## 2014-09-24 DIAGNOSIS — I77819 Aortic ectasia, unspecified site: Secondary | ICD-10-CM | POA: Insufficient documentation

## 2014-09-24 DIAGNOSIS — G4733 Obstructive sleep apnea (adult) (pediatric): Secondary | ICD-10-CM | POA: Diagnosis not present

## 2014-09-24 DIAGNOSIS — Z79899 Other long term (current) drug therapy: Secondary | ICD-10-CM | POA: Diagnosis not present

## 2014-09-24 HISTORY — PX: TEE WITHOUT CARDIOVERSION: SHX5443

## 2014-09-24 SURGERY — ECHOCARDIOGRAM, TRANSESOPHAGEAL
Anesthesia: Moderate Sedation

## 2014-09-24 MED ORDER — SODIUM CHLORIDE 0.9 % IV SOLN
INTRAVENOUS | Status: DC
Start: 2014-09-24 — End: 2014-09-24

## 2014-09-24 MED ORDER — MIDAZOLAM HCL 5 MG/ML IJ SOLN
INTRAMUSCULAR | Status: AC
Start: 2014-09-24 — End: 2014-09-24
  Filled 2014-09-24: qty 2

## 2014-09-24 MED ORDER — FENTANYL CITRATE (PF) 100 MCG/2ML IJ SOLN
INTRAMUSCULAR | Status: AC
Start: 2014-09-24 — End: 2014-09-24
  Filled 2014-09-24: qty 2

## 2014-09-24 MED ORDER — MIDAZOLAM HCL 10 MG/2ML IJ SOLN
INTRAMUSCULAR | Status: DC | PRN
  Administered 2014-09-24: 2 mg via INTRAVENOUS
  Administered 2014-09-24: 1 mg via INTRAVENOUS
  Administered 2014-09-24: 2 mg via INTRAVENOUS

## 2014-09-24 MED ORDER — FENTANYL CITRATE (PF) 100 MCG/2ML IJ SOLN
INTRAMUSCULAR | Status: DC | PRN
  Administered 2014-09-24: 25 ug via INTRAVENOUS
  Administered 2014-09-24: 50 ug via INTRAVENOUS
  Administered 2014-09-24: 25 ug via INTRAVENOUS

## 2014-09-24 MED ORDER — SODIUM CHLORIDE 0.9 % IV SOLN
INTRAVENOUS | Status: DC
Start: 2014-09-24 — End: 2014-09-24
  Administered 2014-09-24: 500 mL via INTRAVENOUS

## 2014-09-24 MED ORDER — BUTAMBEN-TETRACAINE-BENZOCAINE 2-2-14 % EX AERO
INHALATION_SPRAY | CUTANEOUS | Status: DC | PRN
  Administered 2014-09-24: 2 via TOPICAL

## 2014-09-24 NOTE — CV Procedure (Addendum)
     Transesophageal Echocardiogram Note  [name] [name] [id number] 07-15-1945  Procedure: Transesophageal Echocardiogram Indications: a-fib, pre ablation  Procedure Details Consent: Obtained Time Out: Verified patient identification, verified procedure, site/side was marked, verified correct patient position, special equipment/implants available, Radiology Safety Procedures followed,  medications/allergies/relevent history reviewed, required imaging and test results available.  Performed  Medications: Fentanyl: 100 mcg  Versed: 5 mg  Left ventricle: Systolic function was normal. The estimated ejection fraction was in the range of 60% to 65%. Wall motion was normal; there were no regional wall motion abnormalities.  - Aortic valve: There was trivial regurgitation. - Aortic root: mildly dilated aortic root. - Mitral valve: There was mild regurgitation. - Left atrium: The atrium was mildly dilated. No thrombus in LA or LAA, normal filling and emptying velocities.  - Right ventricle: The cavity size was mildly dilated. Wall thickness was normal. Systolic function was normal. - Tricuspid valve: There was trivial regurgitation - Pulmonary arteries: Systolic pressure was mildly increased. PA  Complications: No apparent complications Patient did tolerate procedure well.  Dorothy Spark, MD, [facility] 09/24/2014, 8:58 AM

## 2014-09-24 NOTE — H&P (Signed)
Patient ID: [name] [name] MRN: [id number], DOB/AGE: May 11, 1945   Admit date: 09/24/2014   Primary Physician: [name], MD Primary Cardiologist: Dr Rayann Heman   Problem List  Past Medical History  Diagnosis Date  . Paroxysmal atrial fibrillation     a. s/p afib ablations 2002 and 2009 by Dr Rolland Porter. b. s/p DCCV 02/2013. c. s/p DCCV 01/19/2014  . H/O cardiac catheterization     no significant CAD by cath 5/12  . Cardiomyopathy     a. 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 b. also hx of tachycardia mediated CM, now resolved  . Hypertension   . Hyperlipidemia   . Thoracic aortic aneurysm     a. Borderline enlarged by CT 12/14 at 4.0cm.  . Depression   . OSA (obstructive sleep apnea)     AHI avg 5/hr  . Limb pain 06/20/2007    LE venous doppler - no evidence of thrombus or thrombophlebitis  . PVC (premature ventricular contraction) 12/18/2009    cardionet monitor 21 days - some PVCs, ventricular bigeminy  . Pericardial effusion     a. Since 2007. b. s/p pericardial window 04/2013.  Marland Kitchen Echocardiogram abnormal     EF60%, mild LVH, PA pk pressure 35, pericardial effusion mild increase from 2012   . Complication of anesthesia     Pt has anxiety issues and thinks he is going to die.  . Liver mass     a. CT 03/2013: Small probable cysts in the liver and small hyperenhancing liver mass.    Past Surgical History  Procedure Laterality Date  . Cardiac catheterization  08/25/10    NO SIGNIFICANT CAD  . Cardioversion  10/18/2007    successful  . Elbow / upper arm foreign body removal    . Transesophageal echocardiogram  08/26/2010    see Notes tab  . Cardiovascular stress test  09/08/2005    R/S MV - EF 61%; Exercises capacity 12 Mets; essentially normal perfusion myocardial scan    . Cardiac electrophysiology mapping and ablation  [id number]    Dr. Tally Due  . Osteosynthesis Left 09/30/2012    with small plate in the ulna  . Cardioversion N/A 02/08/2013   Procedure: CARDIOVERSION;  Surgeon: Troy Sine, MD;  Location: [facility] ENDOSCOPY;  Service: Cardiovascular;  Laterality: N/A;  . Fracture surgery Left     Left wrist  . Subxyphoid pericardial window N/A 04/25/2013    Procedure: SUBXYPHOID PERICARDIAL WINDOW;  Surgeon: Gaye Pollack, MD;  Location: [place] OR;  Service: Thoracic;  Laterality: N/A;  . Carpal tunnel release Left April 2015  . Cardioversion N/A 08/17/2013    Procedure: CARDIOVERSION;  Surgeon: Sanda Klein, MD;  Location: [place];  Service: Cardiovascular;  Laterality: N/A;  . Cardioversion N/A 12/28/2013    Procedure: CARDIOVERSION;  Surgeon: Fay Records, MD;  Location: [place];  Service: Cardiovascular;  Laterality: N/A;  . Cardioversion N/A 01/19/2014    Procedure: CARDIOVERSION;  Surgeon: Sanda Klein, MD;  Location: [facility] ENDOSCOPY;  Service: Cardiovascular;  Laterality: N/A;  . Cardioversion N/A 03/11/2014    Procedure: CARDIOVERSION;  Surgeon: Evans Lance, MD;  Location: [place];  Service: Cardiovascular;  Laterality: N/A;  . Left and right heart catheterization with coronary angiogram N/A 03/17/2013    Procedure: LEFT AND RIGHT HEART CATHETERIZATION WITH CORONARY ANGIOGRAM;  Surgeon: Troy Sine, MD;  Location[facility] CATH LAB;  Service: Cardiovascular;  Laterality: N/A;  . Cardioversion N/A 08/23/2014    Procedure:  CARDIOVERSION;  Surgeon: Lelon Perla, MD;  Location: [facility] ENDOSCOPY;  Service: Cardiovascular;  Laterality: N/A;     Allergies  Allergies  Allergen Reactions  . Tetracyclines & Related Hives    rash    HPI  Patient is a 69 y.o. male with a PMHx of atrial fibrillation, followed by Dr Rayann Heman, who presents today for a TEE prior to a-fib ablation.   Home Medications  Prior to Admission medications   Medication Sig Start Date End Date Taking? Authorizing Provider  atorvastatin (LIPITOR) 40 MG tablet Take 1 tablet (40 mg total) by mouth daily. 09/27/13  Yes Thompson Grayer, MD  Cholecalciferol  (VITAMIN D) 2000 UNITS CAPS Take 2,000 Units by mouth daily.   Yes Historical Provider, MD  dabigatran (PRADAXA) 150 MG CAPS capsule Take 1 capsule (150 mg total) by mouth 2 (two) times daily. 09/27/13  Yes Thompson Grayer, MD  diltiazem (CARDIZEM CD) 240 MG 24 hr capsule Take 1 capsule (240 mg total) by mouth daily. 09/27/13  Yes Thompson Grayer, MD  dofetilide (TIKOSYN) 500 MCG capsule Take 1 capsule (500 mcg total) by mouth 2 (two) times daily. 03/12/14  Yes Eileen Stanford, PA-C  finasteride (PROPECIA) 1 MG tablet Take 1 mg by mouth every morning.    Yes Historical Provider, MD  furosemide (LASIX) 40 MG tablet Take 1 tablet (40 mg total) by mouth daily. Patient taking differently: Take 40 mg by mouth 2 (two) times daily.  06/11/14  Yes Thompson Grayer, MD  metoprolol succinate (TOPROL-XL) 100 MG 24 hr tablet Take 1 tablet (100 mg total) by mouth 2 (two) times daily. Take with or immediately following a meal. 09/27/13  Yes Thompson Grayer, MD  pantoprazole (PROTONIX) 40 MG tablet Take 1 tablet (40 mg total) by mouth at bedtime. 10/02/13  Yes Isaiah Serge, NP  potassium chloride SA (K-DUR,KLOR-CON) 20 MEQ tablet Take 30 mEq by mouth daily.    Yes Historical Provider, MD  Saw Palmetto, Serenoa repens, (SAW PALMETTO PO) Take 900 mg by mouth 2 (two) times daily.    Yes Historical Provider, MD  NON FORMULARY Take 1 capsule by mouth 2 (two) times daily. [place]    Historical Provider, MD    Family History  Family History  Problem Relation Age of Onset  . Coronary artery disease Father   . Stroke Mother     died  . Atrial fibrillation Brother   . Heart attack Father   . Mitral valve prolapse Sister     Social History  History   Social History  . Marital Status: Single    Spouse Name: N/A  . Number of Children: 0  . Years of Education: N/A   Occupational History  .  [place] History Main Topics  . Smoking status: Former Smoker -- 1.00 packs/day for 10 years    Quit date:  02/06/1989  . Smokeless tobacco: Never Used  . Alcohol Use: No     Comment: 2 bottles of wine per week, pt states has not drank alcohol since christmas  . Drug Use: No  . Sexual Activity: Not on file   Other Topics Concern  . 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     Review of Systems General:  No chills, fever, night sweats or weight changes.  Cardiovascular:  No chest pain, dyspnea on exertion, edema, orthopnea, palpitations, paroxysmal nocturnal dyspnea. Dermatological: No rash, lesions/masses Respiratory: No cough, dyspnea Urologic: No hematuria, dysuria Abdominal:   No nausea, vomiting, diarrhea, bright red blood per rectum, melena, or hematemesis Neurologic:  No visual changes, wkns, changes in mental status. All other systems reviewed and are otherwise negative except as noted above.  Physical Exam  Blood pressure 128/88, pulse 67, temperature 98.6 F (37 C), temperature source Oral, resp. rate 14, height 6\' 2"  (1.88 m), weight 200 lb (90.719 kg), SpO2 100 %.  General: Pleasant, NAD Psych: Normal affect. Neuro: Alert and oriented X 3. Moves all extremities spontaneously. HEENT: Normal  Neck: Supple without bruits or JVD. Lungs:  Resp regular and unlabored, CTA. Heart: RRR no s3, s4, or murmurs. Abdomen: Soft, non-tender, non-distended, BS + x 4.  Extremities: No clubbing, cyanosis or edema. DP/PT/Radials 2+ and equal bilaterally.  Labs  No results for input(s): CKTOTAL, CKMB, TROPONINI in the last 72 hours. Lab Results  Component Value Date   WBC 6.4 09/18/2014   HGB 12.9* 09/18/2014   HCT 39.2 09/18/2014   MCV 87.0 09/18/2014   PLT 203.0 09/18/2014    Recent Labs Lab 09/18/14 0928  NA 137  K 3.6  CL 103  CO2 27  BUN 19  CREATININE 0.97  CALCIUM 9.5    GLUCOSE 90   Lab Results  Component Value Date   CHOL  08/23/2007    188        ATP III CLASSIFICATION:  <200     mg/dL   Desirable  200-239  mg/dL   Borderline High  >=240    mg/dL   High   HDL 47 08/23/2007   LDLCALC * 08/23/2007    128        Total Cholesterol/HDL:CHD Risk Coronary Heart Disease Risk Table                     Men   Women  1/2 Average Risk   3.4   3.3   TRIG 63 08/23/2007   No results found for: DDIMER Invalid input(s): POCBNP   Radiology/Studies  Ct Heart Morp W/cta Cor W/score W/ca W/cm &/or Wo/cm  09/19/2014   ADDENDUM REPORT: 09/19/2014 11:16  CLINICAL DATA:  Chest pain  EXAM: Cardiac/Coronary  CT  TECHNIQUE: The patient was scanned on a Philips 256 scanner.  FINDINGS: A 120 kV prospective scan was triggered in the descending thoracic aorta at 111 HU's. Axial non-contrast 3 mm slices were carried out through the heart. The data set was analyzed on a dedicated work station and scored using the [place]. Gantry rotation speed was 270 msecs and collimation was .9 mm. No beta blockade nor of sl NTG was given. The 3D data set was reconstructed in 5% intervals of the 67-82 % of the R-R cycle. Diastolic phases were analyzed on a dedicated work station using MPR, MIP and VRT modes. The patient received 80 cc of contrast.  There are four pulmonary veins draining in a normal position into the left atrium. The measurements are as follows:  1.  RUPV:  20 x 15 mm  2.  RLPV:  17 x 15 mm  3.  LUPV:  20 x 14 mm  4.  LLPV:  14 x 13 mm  There is no evidence of stenosis.  Left atrial appendage is very large without any filling defect.  Esophagus  runs in the midline of the left atrium, not in the close proximity to any of the pulmonary vein (low risk of fistula).  Aorta: Minimal diffuse calcifications. Normal caliber of the ascending, descending thoracic aorta and aortic arch.  Aortic Valve:  Trileaflet.  No calcifications.  Dilated aortic root with sinuses measuring:  Right:  43  mm  Left:  46 mm  Non-coronary:  43 mm  Mildly dilated pulmonary artery measuring 31 x 31 mm  Coronary Arteries: The study was suboptimal for coronary evaluation (without use of NTG), however there is normal coronary origin with right dominance. However, there are significant calcifications in all three coronary arteries with total calcium score of 530 that represents 82% for age and sex.  There is normal coronary origin with right dominance.  RCA is a large dominant vessel that has mild to moderate diffuse calcified plaque in the proximal (25-50% stenosis), proximal to mid (50-69% stenosis) and distal 25-50% stenosis.  RCA gives rise to PDA and PLA that have no plaque.  Left main has no plaque.  LAD is a large vessel. There is moderate mixed plaque in its proximal portion that is associated with 50-70 stenosis however has high risk features (napkin ring sign). Mid LAD has mild calcified plaque with stenosis 25-50%.  The first diagonal is small and has no obvious plaque.  LCX artery is small non-dominant vessel with minimal plaque.  IMPRESSION: 1.  Normal pulmonary vein drainage with no evidence of stenosis.  2. A large left atrial appendage with no filling defect (no thrombus).  3.  Dilated aortic root.  4. Calcium score of 530 - representing 82% when matched for age and sex.  5. 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 If clinically indicated a functional study (stress test) or left cardiac cath with FFR is recommended.  6.  Mildly dilated pulmonary artery.  Ena Dawley   Electronically Signed   By: Ena Dawley   On: 09/19/2014 11:16   09/19/2014   EXAM: OVER-READ INTERPRETATION  CT CHEST  The following report is an over-read performed by radiologist Dr. Rebekah Chesterfield [facility] on 09/18/2014. This over-read does not include interpretation of cardiac or coronary anatomy or pathology. The coronary calcium score/coronary CTA interpretation by  the cardiologist is attached.  COMPARISON:  Chest CT 03/10/2013.  FINDINGS: 2 mm right upper lobe pulmonary nodule (image 37 of series 204). 4 mm left lower lobe nodule (image 79 of series 204). No other suspicious appearing pulmonary nodules or masses in the visualized portions of the lungs. Within the visualized thorax there is no acute consolidative airspace disease, no pleural effusion, no pneumothorax and no lymphadenopathy. 7 mm hypervascular lesion in segment 2 of the liver (image 60 of series 504). Other low-attenuation lesions in the liver are also noted, likely small cysts, largest of which is 15 mm in segment 2. There are no aggressive appearing lytic or blastic lesions noted in the visualized portions of the skeleton.  IMPRESSION: 1. Tiny pulmonary nodules in lungs bilaterally, largest of which measures 4 mm in the left lower lobe. If the patient is at high risk for bronchogenic carcinoma, follow-up chest CT at 1 year is recommended. If the patient is at low risk, no follow-up is needed. This recommendation follows the consensus statement: Guidelines for Management of Small Pulmonary Nodules Detected on CT Scans: A Statement from the [facility] as published in Radiology 2005; 237:395-400. 2. Tiny hypervascular lesion  in segment 2 of the liver is incompletely characterized. This is favored to represent a small benign perfusion anomaly or other benign lesion such as a flash fill cavernous hemangioma. The likelihood of a hypervascular lesion such as a hepatocellular carcinoma, or metastatic lesion from hypervascular primary such as melanoma or renal cell carcinoma could also be considered in the appropriate clinical setting, however, that is not favored. This could be definitively characterized with MRI of the abdomen with and without IV gadolinium if clinically appropriate.  Electronically Signed: By: Vinnie Langton M.D. On: 09/18/2014 13:42     ASSESSMENT AND PLAN:  1. Persistent afib He  continues to struggle with symptomatic persistent afib. He is s/p prior ablation at [facility] in 2002 and 2009. Therapeutic strategies for afib including medicine and ablation were discussed in detail with the patient today. Risk, benefits, and alternatives to EP study and repeat radiofrequency ablation for afib were also discussed in detail today. The patient would like to consider repeat ablation this summer. He will return in 2 months to discuss this further. I would likely plan for a cardiac CT prior to ablation to evaluate for pulmonary vein stenosis from prior ablations at [facility]. Continue current medicines at this time. BMET, MG on tikosyn. CBC today on pradaxa.  2. Shortness of breath/ edema Resolved with sinus rhythm  3. OSA Compliance with CPAP is encouraged.   4. HTN Stable  Signed, Dorothy Spark, MD, [facility] 09/24/2014, 8:55 AM

## 2014-09-24 NOTE — Progress Notes (Signed)
  Echocardiogram Echocardiogram Transesophageal has been performed.  Bobbye Charleston 09/24/2014, 9:49 AM

## 2014-09-24 NOTE — Progress Notes (Deleted)
*  PRELIMINARY RESULTS* Echocardiogram Echocardiogram Transesophageal has been performed.  [name] [name] 09/24/2014, 9:48 AM

## 2014-09-24 NOTE — Discharge Instructions (Signed)
Transesophageal Echocardiogram °Transesophageal echocardiography (TEE) is a picture test of your heart using sound waves. The pictures taken can give very detailed pictures of your heart. This can help your doctor see if there are problems with your heart. TEE can check: °· If your heart has blood clots in it. °· How well your heart valves are working. °· If you have an infection on the inside of your heart. °· Some of the major arteries of your heart. °· If your heart valve is working after a repair. °· Your heart before a procedure that uses a shock to your heart to get the rhythm back to normal. °BEFORE THE PROCEDURE °· Do not eat or drink for 6 hours before the procedure or as told by your doctor. °· Make plans to have someone drive you home after the procedure. Do not drive yourself home. °· An IV tube will be put in your arm. °PROCEDURE °· You will be given a medicine to help you relax (sedative). It will be given through the IV tube. °· A numbing medicine will be sprayed or gargled in the back of your throat to help numb it. °· The tip of the probe is placed into the back of your mouth. You will be asked to swallow. This helps to pass the probe into your esophagus. °· Once the tip of the probe is in the right place, your doctor can take pictures of your heart. °· You may feel pressure at the back of your throat. °AFTER THE PROCEDURE °· You will be taken to a recovery area so the sedative can wear off. °· Your throat may be sore and scratchy. This will go away slowly over time. °· You will go home when you are fully awake and able to swallow liquids. °· You should have someone stay with you for the next 24 hours. °· Do not drive or operate machinery for the next 24 hours. °Document Released: 01/18/2009 Document Revised: 03/28/2013 Document Reviewed: 09/22/2012 °[employer]® Patient Information ©2015 [employer]. This information is not intended to replace advice given to you by your health care provider. Make  sure you discuss any questions you have with your health care provider. ° °

## 2014-09-25 ENCOUNTER — Encounter (HOSPITAL_COMMUNITY): Payer: Self-pay | Admitting: Anesthesiology

## 2014-09-25 ENCOUNTER — Encounter (HOSPITAL_COMMUNITY)
Admission: RE | Disposition: A | Discharge status: Home / Self Care | Payer: Medicare Other | Source: Ambulatory Visit | Attending: Internal Medicine

## 2014-09-25 ENCOUNTER — Ambulatory Visit (HOSPITAL_COMMUNITY): Payer: Medicare Other | Admitting: Anesthesiology

## 2014-09-25 ENCOUNTER — Ambulatory Visit (HOSPITAL_COMMUNITY)
Admission: RE | Admit: 2014-09-25 | Discharge: 2014-09-26 | Disposition: A | Discharge status: Home / Self Care | Payer: Medicare Other | Source: Ambulatory Visit | Attending: Internal Medicine | Admitting: Internal Medicine

## 2014-09-25 DIAGNOSIS — I712 Thoracic aortic aneurysm, without rupture: Secondary | ICD-10-CM | POA: Diagnosis not present

## 2014-09-25 DIAGNOSIS — Z79899 Other long term (current) drug therapy: Secondary | ICD-10-CM | POA: Diagnosis not present

## 2014-09-25 DIAGNOSIS — I480 Paroxysmal atrial fibrillation: Secondary | ICD-10-CM | POA: Diagnosis not present

## 2014-09-25 DIAGNOSIS — E785 Hyperlipidemia, unspecified: Secondary | ICD-10-CM | POA: Diagnosis not present

## 2014-09-25 DIAGNOSIS — Z7901 Long term (current) use of anticoagulants: Secondary | ICD-10-CM | POA: Insufficient documentation

## 2014-09-25 DIAGNOSIS — G4733 Obstructive sleep apnea (adult) (pediatric): Secondary | ICD-10-CM | POA: Insufficient documentation

## 2014-09-25 DIAGNOSIS — I10 Essential (primary) hypertension: Secondary | ICD-10-CM | POA: Insufficient documentation

## 2014-09-25 DIAGNOSIS — I739 Peripheral vascular disease, unspecified: Secondary | ICD-10-CM | POA: Diagnosis not present

## 2014-09-25 DIAGNOSIS — I4891 Unspecified atrial fibrillation: Secondary | ICD-10-CM | POA: Diagnosis present

## 2014-09-25 DIAGNOSIS — F329 Major depressive disorder, single episode, unspecified: Secondary | ICD-10-CM | POA: Insufficient documentation

## 2014-09-25 DIAGNOSIS — I481 Persistent atrial fibrillation: Secondary | ICD-10-CM | POA: Diagnosis present

## 2014-09-25 DIAGNOSIS — Z87891 Personal history of nicotine dependence: Secondary | ICD-10-CM | POA: Insufficient documentation

## 2014-09-25 HISTORY — PX: ELECTROPHYSIOLOGIC STUDY: SHX172A

## 2014-09-25 LAB — POCT ACTIVATED CLOTTING TIME
ACTIVATED CLOTTING TIME: 275 s
ACTIVATED CLOTTING TIME: 288 s
ACTIVATED CLOTTING TIME: 177 s
Activated Clotting Time: 190 seconds

## 2014-09-25 LAB — MRSA PCR SCREENING: MRSA by PCR: NEGATIVE

## 2014-09-25 SURGERY — ATRIAL FIBRILLATION ABLATION
Anesthesia: Monitor Anesthesia Care

## 2014-09-25 MED ORDER — SODIUM CHLORIDE 0.9 % IJ SOLN
3.0000 mL | Freq: Two times a day (BID) | INTRAMUSCULAR | Status: DC
  Administered 2014-09-25 – 2014-09-26 (×2): 3 mL via INTRAVENOUS

## 2014-09-25 MED ORDER — FENTANYL CITRATE (PF) 100 MCG/2ML IJ SOLN
INTRAMUSCULAR | Status: DC | PRN
  Administered 2014-09-25 (×2): 25 ug via INTRAVENOUS
  Administered 2014-09-25: 50 ug via INTRAVENOUS
  Administered 2014-09-25 (×6): 25 ug via INTRAVENOUS

## 2014-09-25 MED ORDER — PROMETHAZINE HCL 25 MG/ML IJ SOLN: 6.2500 mg | INTRAMUSCULAR | Status: DC | PRN

## 2014-09-25 MED ORDER — ACETAMINOPHEN 325 MG PO TABS: 650.0000 mg | ORAL_TABLET | ORAL | Status: DC | PRN

## 2014-09-25 MED ORDER — DOBUTAMINE IN D5W 4-5 MG/ML-% IV SOLN
INTRAVENOUS | Status: DC | PRN
  Administered 2014-09-25: 20 ug/kg/min via INTRAVENOUS

## 2014-09-25 MED ORDER — PROPOFOL 10 MG/ML IV BOLUS
INTRAVENOUS | Status: DC | PRN
  Administered 2014-09-25: 200 mg via INTRAVENOUS

## 2014-09-25 MED ORDER — PANTOPRAZOLE SODIUM 40 MG PO TBEC
40.0000 mg | DELAYED_RELEASE_TABLET | Freq: Every day | ORAL | Status: DC
  Administered 2014-09-25: 40 mg via ORAL
  Filled 2014-09-25: qty 1

## 2014-09-25 MED ORDER — PROTAMINE SULFATE 10 MG/ML IV SOLN
INTRAVENOUS | Status: DC | PRN
  Administered 2014-09-25: 30 mg via INTRAVENOUS

## 2014-09-25 MED ORDER — SODIUM CHLORIDE 0.9 % IV SOLN: 250.0000 mL | INTRAVENOUS | Status: DC | PRN

## 2014-09-25 MED ORDER — DABIGATRAN ETEXILATE MESYLATE 150 MG PO CAPS
150.0000 mg | ORAL_CAPSULE | Freq: Two times a day (BID) | ORAL | Status: DC
  Administered 2014-09-25 – 2014-09-26 (×2): 150 mg via ORAL
  Filled 2014-09-25 (×3): qty 1

## 2014-09-25 MED ORDER — DOBUTAMINE IN D5W 4-5 MG/ML-% IV SOLN
INTRAVENOUS | Status: AC
  Filled 2014-09-25: qty 250

## 2014-09-25 MED ORDER — ADENOSINE 6 MG/2ML IV SOLN
INTRAVENOUS | Status: DC | PRN
  Administered 2014-09-25 (×2): 12 mg via INTRAVENOUS

## 2014-09-25 MED ORDER — LIDOCAINE HCL (CARDIAC) 20 MG/ML IV SOLN
INTRAVENOUS | Status: DC | PRN
  Administered 2014-09-25: 80 mg via INTRAVENOUS

## 2014-09-25 MED ORDER — ADENOSINE 6 MG/2ML IV SOLN
INTRAVENOUS | Status: AC
  Filled 2014-09-25: qty 16

## 2014-09-25 MED ORDER — HYDROCODONE-ACETAMINOPHEN 5-325 MG PO TABS: 1.0000 | ORAL_TABLET | ORAL | Status: DC | PRN

## 2014-09-25 MED ORDER — SODIUM CHLORIDE 0.9 % IJ SOLN: 3.0000 mL | INTRAMUSCULAR | Status: DC | PRN

## 2014-09-25 MED ORDER — HEPARIN SODIUM (PORCINE) 1000 UNIT/ML IJ SOLN
INTRAMUSCULAR | Status: DC | PRN
  Administered 2014-09-25: 3000 [IU] via INTRAVENOUS

## 2014-09-25 MED ORDER — EPHEDRINE SULFATE 50 MG/ML IJ SOLN
INTRAMUSCULAR | Status: DC | PRN
  Administered 2014-09-25: 10 mg via INTRAVENOUS
  Administered 2014-09-25 (×2): 5 mg via INTRAVENOUS

## 2014-09-25 MED ORDER — FINASTERIDE 1 MG PO TABS
1.0000 mg | ORAL_TABLET | Freq: Every morning | ORAL | Status: DC
  Filled 2014-09-25: qty 1

## 2014-09-25 MED ORDER — MIDAZOLAM HCL 5 MG/5ML IJ SOLN
INTRAMUSCULAR | Status: DC | PRN
  Administered 2014-09-25: 2 mg via INTRAVENOUS

## 2014-09-25 MED ORDER — SODIUM CHLORIDE 0.9 % IV SOLN
INTRAVENOUS | Status: DC | PRN
  Administered 2014-09-25: 07:00:00 via INTRAVENOUS

## 2014-09-25 MED ORDER — ONDANSETRON HCL 4 MG/2ML IJ SOLN: 4.0000 mg | Freq: Four times a day (QID) | INTRAMUSCULAR | Status: DC | PRN

## 2014-09-25 MED ORDER — METOPROLOL SUCCINATE ER 100 MG PO TB24
100.0000 mg | ORAL_TABLET | Freq: Every day | ORAL | Status: DC
  Filled 2014-09-25: qty 1

## 2014-09-25 MED ORDER — HEPARIN SODIUM (PORCINE) 1000 UNIT/ML IJ SOLN
INTRAMUSCULAR | Status: AC
  Filled 2014-09-25: qty 1

## 2014-09-25 MED ORDER — HYDROMORPHONE HCL 1 MG/ML IJ SOLN: 0.2500 mg | INTRAMUSCULAR | Status: DC | PRN

## 2014-09-25 MED ORDER — IOHEXOL 350 MG/ML SOLN
INTRAVENOUS | Status: DC | PRN
  Administered 2014-09-25: 5 mL via INTRAVENOUS

## 2014-09-25 MED ORDER — ONDANSETRON HCL 4 MG/2ML IJ SOLN
INTRAMUSCULAR | Status: DC | PRN
  Administered 2014-09-25: 4 mg via INTRAVENOUS

## 2014-09-25 MED ORDER — DOFETILIDE 500 MCG PO CAPS
500.0000 ug | ORAL_CAPSULE | Freq: Two times a day (BID) | ORAL | Status: DC
  Administered 2014-09-25 – 2014-09-26 (×2): 500 ug via ORAL
  Filled 2014-09-25 (×4): qty 1

## 2014-09-25 MED ORDER — BUPIVACAINE HCL (PF) 0.25 % IJ SOLN
INTRAMUSCULAR | Status: AC
  Filled 2014-09-25: qty 30

## 2014-09-25 SURGICAL SUPPLY — 26 items
BAG SNAP BAND KOVER 36X36 (MISCELLANEOUS) ×2 IMPLANT
BLANKET WARM UNDERBOD FULL ACC (MISCELLANEOUS) IMPLANT
CATH DIAG 6FR PIGTAIL (CATHETERS) IMPLANT
CATH NAVISTAR SMARTTOUCH DF (ABLATOR) ×2 IMPLANT
CATH SOUNDSTAR 3D IMAGING (CATHETERS) ×2 IMPLANT
CATH VARIABLE LASSO NAV 2515 (CATHETERS) ×2 IMPLANT
CATH WEBSTER BI DIR CS D-F CRV (CATHETERS) ×2 IMPLANT
COVER SWIFTLINK CONNECTOR (BAG) ×2 IMPLANT
NEEDLE TRANSEP BRK 71CM 407200 (NEEDLE) ×2 IMPLANT
PACK EP LATEX FREE (CUSTOM PROCEDURE TRAY) ×1
PACK EP LF (CUSTOM PROCEDURE TRAY) ×1 IMPLANT
PAD DEFIB LIFELINK (PAD) ×2 IMPLANT
PATCH CARTO3 (PAD) ×2 IMPLANT
SHEATH AVANTI 11F 11CM (SHEATH) ×2 IMPLANT
SHEATH PINNACLE 6F 10CM (SHEATH) IMPLANT
SHEATH PINNACLE 7F 10CM (SHEATH) ×4 IMPLANT
SHEATH PINNACLE 8F 10CM (SHEATH) IMPLANT
SHEATH PINNACLE 9F 10CM (SHEATH) ×2 IMPLANT
SHEATH SWARTZ TS SL2 63CM 8.5F (SHEATH) ×2 IMPLANT
SHIELD RADPAD SCOOP 12X17 (MISCELLANEOUS) ×2 IMPLANT
SYR MEDRAD MARK V 150ML (SYRINGE) IMPLANT
TUBING ART PRESS 72  MALE/FEM (TUBING) ×1
TUBING ART PRESS 72 MALE/FEM (TUBING) ×1 IMPLANT
TUBING CONTRAST HIGH PRESS 48 (TUBING) IMPLANT
TUBING COOLFLOW (TUBING) ×2 IMPLANT
TUBING SMART ABLATE COOLFLOW (TUBING) IMPLANT

## 2014-09-25 NOTE — Anesthesia Preprocedure Evaluation (Addendum)
Anesthesia Evaluation  Patient identified by MRN, date of birth, ID band Patient awake    Reviewed: Allergy & Precautions, NPO status , Patient's Chart, lab work & pertinent test results, reviewed documented beta blocker date and time , Unable to perform ROS - Chart review only  Airway Mallampati: II  TM Distance: >3 FB Neck ROM: Full    Dental  (+) Teeth Intact   Pulmonary sleep apnea , former smoker,  breath sounds clear to auscultation        Cardiovascular hypertension, Pt. on medications + Peripheral Vascular Disease + dysrhythmias Atrial Fibrillation Rhythm:Irregular Rate:Normal     Neuro/Psych    GI/Hepatic negative GI ROS, Neg liver ROS,   Endo/Other  negative endocrine ROS  Renal/GU      Musculoskeletal   Abdominal   Peds  Hematology   Anesthesia Other Findings   Reproductive/Obstetrics                           Anesthesia Physical Anesthesia Plan  ASA: III  Anesthesia Plan: MAC   Post-op Pain Management:    Induction: Intravenous  Airway Management Planned: Simple Face Mask  Additional Equipment:   Intra-op Plan:   Post-operative Plan:   Informed Consent: I have reviewed the patients History and Physical, chart, labs and discussed the procedure including the risks, benefits and alternatives for the proposed anesthesia with the patient or authorized representative who has indicated his/her understanding and acceptance.   Dental advisory given  Plan Discussed with: CRNA and Surgeon  Anesthesia Plan Comments:         Anesthesia Quick Evaluation

## 2014-09-25 NOTE — Progress Notes (Signed)
7,9,11 french sheaths, initially removed by Chip Ouida Sills R.N.   Manual pressure maintained for 30 minutes. Groin level 0, hemostasis achieved. Tegaderm dressing applied, bedrest instructions given.   Bedrest begins at 11:55:00

## 2014-09-25 NOTE — Discharge Instructions (Addendum)
No driving for 4 days. No lifting over 5 lbs for 1 week. No sexual activity for 1 week. You may return to work in 1 week.   Keep procedure site clean & dry. If you notice increased pain, swelling, bleeding or pus, call/return!  You may shower, but no soaking baths/hot tubs/pools for 1 week.      Information on my medicine - Pradaxa (dabigatran)  This medication education was reviewed with me or my healthcare representative as part of my discharge preparation.    Why was Pradaxa prescribed for you? Pradaxa was prescribed for you to reduce the risk of forming blood clots that cause a stroke if you have a medical condition called atrial fibrillation (a type of irregular heartbeat).    What do you Need to know about PradAXa? Take your Pradaxa TWICE DAILY - one capsule in the morning and one tablet in the evening with or without food.  It would be best to take the doses about the same time each day.  The capsules should not be broken, chewed or opened - they must be swallowed whole.  Do not store Pradaxa in other medication containers - once the bottle is opened the Pradaxa should be used within FOUR months; throw away any capsules that havent been by that time.  Take Pradaxa exactly as prescribed by your doctor.  DO NOT stop taking Pradaxa without talking to the doctor who prescribed the medication.  Stopping without other stroke prevention medication to take the place of Pradaxa may increase your risk of developing a clot that causes a stroke.  Refill your prescription before you run out.  After discharge, you should have regular check-up appointments with your healthcare provider that is prescribing your Pradaxa.  In the future your dose may need to be changed if your kidney function or weight changes by a significant amount.  What do you do if you miss a dose? If you miss a dose, take it as soon as you remember on the same day.  If your next dose is less than 6 hours away, skip the  missed dose.  Do not take two doses of PRADAXA at the same time.  Important Safety Information A possible side effect of Pradaxa is bleeding. You should call your healthcare provider right away if you experience any of the following: ? Bleeding from an injury or your nose that does not stop. ? Unusual colored urine (red or dark brown) or unusual colored stools (red or black). ? Unusual bruising for unknown reasons. ? A serious fall or if you hit your head (even if there is no bleeding).  Some medicines may interact with Pradaxa and might increase your risk of bleeding or clotting while on Pradaxa. To help avoid this, consult your healthcare provider or pharmacist prior to using any new prescription or non-prescription medications, including herbals, vitamins, non-steroidal anti-inflammatory drugs (NSAIDs) and supplements.  This website has more information on Pradaxa (dabigatran): https://www.pradaxa.com

## 2014-09-25 NOTE — Anesthesia Procedure Notes (Addendum)
Procedure Name: LMA Insertion Date/Time: 09/25/2014 7:45 AM Performed by: Susa Loffler Pre-anesthesia Checklist: Patient identified, Emergency Drugs available, Suction available, Patient being monitored and Timeout performed Patient Re-evaluated:Patient Re-evaluated prior to inductionOxygen Delivery Method: Circle system utilized Preoxygenation: Pre-oxygenation with 100% oxygen Intubation Type: IV induction Ventilation: Mask ventilation without difficulty LMA: LMA inserted LMA Size: 5.0 Number of attempts: 1 Placement Confirmation: positive ETCO2 and breath sounds checked- equal and bilateral Tube secured with: Tape Dental Injury: Teeth and Oropharynx as per pre-operative assessment

## 2014-09-25 NOTE — Transfer of Care (Signed)
Immediate Anesthesia Transfer of Care Note  Patient: [name] [name]  Procedure(s) Performed: Procedure(s): Atrial Fibrillation Ablation (N/A)  Patient Location: PACU and Cath Lab  Anesthesia Type:General  Level of Consciousness: awake, alert  and oriented  Airway & Oxygen Therapy: Patient Spontanous Breathing and Patient connected to nasal cannula oxygen  Post-op Assessment: Report given to RN, Post -op Vital signs reviewed and stable and Patient moving all extremities X 4  Post vital signs: Reviewed and stable  Last Vitals:  Filed Vitals:   09/25/14 0536  BP: 123/81  Pulse: 68  Temp: 36.7 C  Resp: 20    Complications: No apparent anesthesia complications

## 2014-09-25 NOTE — H&P (Signed)
ID: [name] [name], DOB [birth year], MRN [id number]  PCP: [name], MD Cardiologist: Dr Claiborne Billings Primary Electrophysiologist: Thompson Grayer, MD   Chief Complaint  Patient presents with  . Persistent AFIB    History of Present Illness: [name] [name] is a 69 y.o. male who presents today for AF ablation.   Today, he denies symptoms of palpitations, chest pain, orthopnea, PND, lower extremity edema, claudication, dizziness, presyncope, syncope, bleeding, or neurologic sequela.   Past Medical History  Diagnosis Date  . Paroxysmal atrial fibrillation     a. s/p afib ablations 2002 and 2009 by Dr Rolland Porter. b. s/p DCCV 02/2013. c. s/p DCCV 01/19/2014  . H/O cardiac catheterization     no significant CAD by cath 5/12  . Cardiomyopathy     a. 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 b. also hx of tachycardia mediated CM, now resolved  . Hypertension   . Hyperlipidemia   . Thoracic aortic aneurysm     a. Borderline enlarged by CT 12/14 at 4.0cm.  . Depression   . OSA (obstructive sleep apnea)     AHI avg 5/hr  . Limb pain 06/20/2007    LE venous doppler - no evidence of thrombus or thrombophlebitis  . PVC (premature ventricular contraction) 12/18/2009    cardionet monitor 21 days - some PVCs, ventricular bigeminy  . Pericardial effusion     a. Since 2007. b. s/p pericardial window 04/2013.  Marland Kitchen Echocardiogram abnormal     EF60%, mild LVH, PA pk pressure 35, pericardial effusion mild increase from 2012   . Complication of anesthesia     Pt has anxiety issues and thinks he is going to die.  . Liver mass     a. CT 03/2013: Small probable cysts in the liver and small hyperenhancing liver mass.   Past Surgical History  Procedure Laterality Date  . Cardiac catheterization  08/25/10    NO SIGNIFICANT CAD  . Cardioversion  10/18/2007    successful  .  Elbow / upper arm foreign body removal    . Transesophageal echocardiogram  08/26/2010    see Notes tab  . Cardiovascular stress test  09/08/2005    R/S MV - EF 61%; Exercises capacity 12 Mets; essentially normal perfusion myocardial scan   . Cardiac electrophysiology mapping and ablation  [id number]    Dr. Tally Due  . Osteosynthesis Left 09/30/2012    with small plate in the ulna  . Cardioversion N/A 02/08/2013    Procedure: CARDIOVERSION; Surgeon: Troy Sine, MD; Location: [facility] ENDOSCOPY; Service: Cardiovascular; Laterality: N/A;  . Fracture surgery Left     Left wrist  . Subxyphoid pericardial window N/A 04/25/2013    Procedure: SUBXYPHOID PERICARDIAL WINDOW; Surgeon: Gaye Pollack, MD; Location: [place] OR; Service: Thoracic; Laterality: N/A;  . Carpal tunnel release Left April 2015  . Cardioversion N/A 08/17/2013    Procedure: CARDIOVERSION; Surgeon: Sanda Klein, MD; Location: [place]; Service: Cardiovascular; Laterality: N/A;  . Cardioversion N/A 12/28/2013    Procedure: CARDIOVERSION; Surgeon: Fay Records, MD; Location: [place]; Service: Cardiovascular; Laterality: N/A;  . Cardioversion N/A 01/19/2014    Procedure: CARDIOVERSION; Surgeon: Sanda Klein, MD; Location: [facility] ENDOSCOPY; Service: Cardiovascular; Laterality: N/A;  . Cardioversion N/A 03/11/2014    Procedure: CARDIOVERSION; Surgeon: Evans Lance, MD; Location: [place]; Service: Cardiovascular; Laterality: N/A;  . Left and right heart catheterization with coronary angiogram N/A 03/17/2013    Procedure: LEFT AND RIGHT HEART CATHETERIZATION WITH CORONARY ANGIOGRAM; Surgeon: Joyice Faster  Claiborne Billings, MD; Location: [facility] CATH LAB; Service: Cardiovascular; Laterality: N/A;     Current Outpatient Prescriptions  Medication Sig Dispense Refill  . atorvastatin (LIPITOR) 40 MG tablet Take 1 tablet (40 mg  total) by mouth daily. 90 tablet 3  . Cholecalciferol (VITAMIN D) 2000 UNITS CAPS Take 2,000 Units by mouth daily.    . dabigatran (PRADAXA) 150 MG CAPS capsule Take 1 capsule (150 mg total) by mouth 2 (two) times daily. 180 capsule 3  . diltiazem (CARDIZEM CD) 240 MG 24 hr capsule Take 1 capsule (240 mg total) by mouth daily. 90 capsule 3  . dofetilide (TIKOSYN) 500 MCG capsule Take 1 capsule (500 mcg total) by mouth 2 (two) times daily. 180 capsule 4  . finasteride (PROPECIA) 1 MG tablet Take 1 mg by mouth every morning.     . furosemide (LASIX) 40 MG tablet Take 1 tablet (40 mg total) by mouth daily. 90 tablet 3  . metoprolol succinate (TOPROL-XL) 100 MG 24 hr tablet Take 1 tablet (100 mg total) by mouth 2 (two) times daily. Take with or immediately following a meal. 180 tablet 3  . NON FORMULARY Take 1 capsule by mouth 2 (two) times daily. Urinozinc    . pantoprazole (PROTONIX) 40 MG tablet Take 1 tablet (40 mg total) by mouth at bedtime. 90 tablet 3  . potassium chloride SA (K-DUR,KLOR-CON) 20 MEQ tablet Take 1 1/2 tablets (30mg ) by mouth in the morning    . Saw Palmetto, Serenoa repens, (SAW PALMETTO PO) Take 600 mg by mouth 2 (two) times daily.      No current facility-administered medications for this visit.    Allergies: Tetracyclines & related   Social History: The patient  reports that he quit smoking about 25 years ago. He has never used smokeless tobacco. He reports that he drinks alcohol. He reports that he does not use illicit drugs.   Family History: The patient's family history includes Atrial fibrillation in his brother; Coronary artery disease in his father; Heart attack in his father; Mitral valve prolapse in his sister; Stroke in his mother.    ROS: Please see the history of present illness. All other systems are reviewed and negative.    PHYSICAL EXAM: VS: BP 118/72 mmHg  Pulse 62  Ht 6\' 2"  (1.88  m)  Wt 204 lb 12.8 oz (92.897 kg)  BMI 26.28 kg/m2 , BMI Body mass index is 26.28 kg/(m^2). GEN: Well nourished, well developed, in no acute distress  HEENT: normal  Neck: no JVD, carotid bruits, or masses Cardiac: RRR; no murmurs, rubs, or gallops,no edema  Respiratory: clear to auscultation bilaterally, normal work of breathing GI: soft, nontender, nondistended, + BS MS: no deformity or atrophy  Skin: warm and dry  Neuro: Strength and sensation are intact Psych: euthymic mood, full affect  EKG: EKG is ordered today. The ekg ordered today shows sinus rhythm 62 bpm, PR 212, septal infarct, otherwise normal ekg Recent Labs: 06/11/2014: BUN 20; Creatinine 1.18; Hemoglobin 13.6; Magnesium 2.3; Platelets 247.0; Potassium 3.9; Sodium 138    Lipid Panel   Labs (Brief)       Component Value Date/Time   CHOL  08/23/2007 0910    188  ATP III CLASSIFICATION: <200 mg/dL Desirable 200-239 mg/dL Borderline High >=240 mg/dL High   TRIG 63 08/23/2007 0910   HDL 47 08/23/2007 0910   CHOLHDL 4.0 08/23/2007 0910   VLDL 13 08/23/2007 0910   LDLCALC * 08/23/2007 0910    128  Total Cholesterol/HDL:CHD  Risk Coronary Heart Disease Risk Table  Men Women 1/2 Average Risk 3.4 3.3       Wt Readings from Last 3 Encounters:  08/13/14 204 lb 12.8 oz (92.897 kg)  06/11/14 206 lb 12.8 oz (93.804 kg)  03/09/14 212 lb 4.9 oz (96.3 kg)     ASSESSMENT AND PLAN:  1. Persistent afib He continues to struggle with symptomatic persistent afib. He is s/p prior ablation at [facility] in 2002 and 2009. Therapeutic strategies for afib including medicine and ablation were discussed in detail with the patient today. Risk, benefits, and alternatives to EP study and radiofrequency ablation for afib were also discussed in detail today. These risks include but are not limited to stroke, bleeding, vascular damage,  tamponade, perforation, damage to the esophagus, lungs, and other structures, pulmonary vein stenosis, worsening renal function, and death. The patient understands these risk and wishes to proceed at this time.

## 2014-09-25 NOTE — Anesthesia Postprocedure Evaluation (Signed)
  Anesthesia Post-op Note  Patient: [name] [name]  Procedure(s) Performed: Procedure(s): Atrial Fibrillation Ablation (N/A)  Patient Location: PACU  Anesthesia Type:General  Level of Consciousness: awake and alert   Airway and Oxygen Therapy: Patient Spontanous Breathing  Post-op Pain: none  Post-op Assessment: Post-op Vital signs reviewed, Patient's Cardiovascular Status Stable and Respiratory Function Stable  Post-op Vital Signs: Reviewed  Filed Vitals:   09/25/14 1103  BP: 109/66  Pulse: 69  Temp:   Resp: 17    Complications: No apparent anesthesia complications

## 2014-09-26 ENCOUNTER — Other Ambulatory Visit: Payer: Self-pay | Admitting: Cardiovascular Disease

## 2014-09-26 DIAGNOSIS — E785 Hyperlipidemia, unspecified: Secondary | ICD-10-CM | POA: Diagnosis not present

## 2014-09-26 DIAGNOSIS — I4891 Unspecified atrial fibrillation: Secondary | ICD-10-CM

## 2014-09-26 DIAGNOSIS — I481 Persistent atrial fibrillation: Secondary | ICD-10-CM | POA: Diagnosis not present

## 2014-09-26 DIAGNOSIS — I480 Paroxysmal atrial fibrillation: Secondary | ICD-10-CM | POA: Diagnosis not present

## 2014-09-26 DIAGNOSIS — I10 Essential (primary) hypertension: Secondary | ICD-10-CM | POA: Diagnosis not present

## 2014-09-26 MED ORDER — METOPROLOL SUCCINATE ER 50 MG PO TB24: 50.0000 mg | ORAL_TABLET | Freq: Every day | ORAL | Status: DC

## 2014-09-26 MED FILL — Bupivacaine HCl Preservative Free (PF) Inj 0.25%: INTRAMUSCULAR | Qty: 30 | Status: AC

## 2014-09-26 NOTE — Discharge Summary (Addendum)
ELECTROPHYSIOLOGY PROCEDURE DISCHARGE SUMMARY    Patient ID: [name] [name],  MRN: [id number], DOB/AGE: 68-Apr-1947 68 y.o.  Admit date: 09/25/2014 Discharge date: 09/26/2014  Primary Care Physician: [name], MD Primary Cardiologist: Claiborne Billings Electrophysiologist: Thompson Grayer, MD  Primary Discharge Diagnosis:  Persistent atrial fibrillation status post ablation this admission  Secondary Discharge Diagnosis:  1.  Hypertension 2.  Hyperlipidemia 3.  Sleep apnea - on CPAP 4.  Remote viral cardiomyopathy  Procedures This Admission:  1.  Electrophysiology study and radiofrequency catheter ablation on 09/25/14 by Dr Thompson Grayer.  This study demonstrated sinus rhythm upon presentation; 3d voltage mapping of the left atrium demonstrated isolation of the RSPV, LSPV, and LIPV from a prior ablation. The posterior wall of the LA was also isolated. The patient was observed to have prodigious conduction within the right inferior pulmonary vein; the patient underwent successful electrical re-isolation and anatomical encircling of the right inferior pulmonary vein using radiofrequency current with a circular mapping catheter as a guide. A WACA approach was used; adenosine 12mg  IV x 4 was then administered and all 4 veins were observed without late return of conduction; CTI block was confirmed from a prior ablation procedure;no inducible arrhythmias following ablation both on and off of dobutamine.  There were no early apparent complications..    Brief HPI: [name] [name] is a 69 y.o. male with a history of persistent atrial fibrillation.  They have undergone previous ablations at [facility] and failed medical therapy with Multaq and tikosyn. Risks, benefits, and alternatives to catheter ablation of atrial fibrillation were reviewed with the patient who wished to proceed.  The patient underwent TEE prior to the procedure which demonstrated normal LV function and no LAA thrombus.    Hospital Course:    The patient was admitted and underwent EPS/RFCA of atrial fibrillation with details as outlined above.  They were monitored on telemetry overnight which demonstrated sinus rhythm.  Groin was without complication on the day of discharge.  The patient was examined and considered to be stable for discharge.  Wound care and restrictions were reviewed with the patient.  The patient will be seen back by Roderic Palau, NP in 4 weeks and Dr Rayann Heman in 12 weeks for post ablation follow up. At 4 week follow-up, will need myoview ordered to evaluate LAD lesion seen on CT scan.  This patients CHA2DS2-VASc Score and unadjusted Ischemic Stroke Rate (% per year) is equal to 2.2 % stroke rate/year from a score of 2 Above score calculated as 1 point each if present [CHF, HTN, DM, Vascular=MI/PAD/Aortic Plaque, Age if 65-74, or Male] Above score calculated as 2 points each if present [Age > 75, or Stroke/TIA/TE]   Physical Exam: Filed Vitals:   09/26/14 0007 09/26/14 0400 09/26/14 0457 09/26/14 0714  BP:  101/59  104/65  Pulse:      Temp: 98.4 F (36.9 C)  98.5 F (36.9 C) 98.3 F (36.8 C)  TempSrc: Oral  Oral Oral  Resp:  23  18  Height:      Weight:      SpO2: 96%  95% 96%    GEN- The patient is well appearing, alert and oriented x 3 today.   HEENT: normocephalic, atraumatic; sclera clear, conjunctiva pink; hearing intact; oropharynx clear; neck supple, no JVP Lymph- no cervical lymphadenopathy Lungs- Clear to ausculation bilaterally, normal work of breathing.  No wheezes, rales, rhonchi Heart- Regular rate and rhythm, no murmurs, rubs or gallops, PMI not laterally displaced GI- soft, non-tender,  non-distended, bowel sounds present, no hepatosplenomegaly Extremities- no clubbing, cyanosis, or edema; DP/PT/radial pulses 2+ bilaterally, groin without hematoma/bruit MS- no significant deformity or atrophy Skin- warm and dry, no rash or lesion Psych- euthymic mood, full affect Neuro- strength and  sensation are intact   Labs:   Lab Results  Component Value Date   WBC 6.4 09/18/2014   HGB 12.9* 09/18/2014   HCT 39.2 09/18/2014   MCV 87.0 09/18/2014   PLT 203.0 09/18/2014   No results for input(s): NA, K, CL, CO2, BUN, CREATININE, CALCIUM, PROT, BILITOT, ALKPHOS, ALT, AST, GLUCOSE in the last 168 hours.  Invalid input(s): LABALBU   Discharge Medications:    Medication List    TAKE these medications        atorvastatin 40 MG tablet  Commonly known as:  LIPITOR  Take 1 tablet (40 mg total) by mouth daily.     dabigatran 150 MG Caps capsule  Commonly known as:  PRADAXA  Take 1 capsule (150 mg total) by mouth 2 (two) times daily.     PRADAXA 150 MG Caps capsule  Generic drug:  dabigatran  TAKE ONE CAPSULE BY MOUTH TWICE A DAY     diltiazem 240 MG 24 hr capsule  Commonly known as:  CARDIZEM CD  Take 1 capsule (240 mg total) by mouth daily.     dofetilide 500 MCG capsule  Commonly known as:  TIKOSYN  Take 1 capsule (500 mcg total) by mouth 2 (two) times daily.     finasteride 1 MG tablet  Commonly known as:  PROPECIA  Take 1 mg by mouth every morning.     furosemide 40 MG tablet  Commonly known as:  LASIX  Take 1 tablet (40 mg total) by mouth daily.     metoprolol succinate 50 MG 24 hr tablet  Commonly known as:  TOPROL-XL  Take 1 tablet (50 mg total) by mouth daily. Take with or immediately following a meal.     NON FORMULARY  Take 1 capsule by mouth 2 (two) times daily. Urinozinc     pantoprazole 40 MG tablet  Commonly known as:  PROTONIX  Take 1 tablet (40 mg total) by mouth at bedtime.     potassium chloride SA 20 MEQ tablet  Commonly known as:  K-DUR,KLOR-CON  Take 30 mEq by mouth daily.     SAW PALMETTO PO  Take 900 mg by mouth 2 (two) times daily.     Vitamin D 2000 UNITS Caps  Take 2,000 Units by mouth daily.        Disposition:  Discharge Instructions    Diet - low sodium heart healthy    Complete by:  As directed      Increase  activity slowly    Complete by:  As directed           Follow-up Information    Follow up with [place] On 10/23/2014.   Why:  at [facility] information:   [place] [phone]      Follow up with Thompson Grayer, MD On 12/26/2014.   Specialty:  Cardiology   Why:  at [facility] information:   [place] [address] [phone]       Duration of Discharge Encounter: Greater than 30 minutes including physician time.  Signed, Chanetta Marshall, NP 09/26/2014 8:20 AM    I have seen, examined the patient, and reviewed the above  assessment and plan.  On exam, RRR.  Changes to above are made where necessary.  Given findings on cardiac CT, would recommend myoview in 4 weeks when he sees Roderic Palau NP to evaluate for LAD related ischemia.  I will review with Dr Claiborne Billings also.  Reduce metoprolol today.  This should be weaned as rhythm allows going forward.  Co Sign: Thompson Grayer, MD 09/26/2014 8:20 AM

## 2014-09-30 ENCOUNTER — Other Ambulatory Visit: Payer: Self-pay | Admitting: Internal Medicine

## 2014-10-01 ENCOUNTER — Other Ambulatory Visit: Payer: Self-pay

## 2014-10-01 ENCOUNTER — Encounter (HOSPITAL_COMMUNITY): Payer: Self-pay | Admitting: Internal Medicine

## 2014-10-01 ENCOUNTER — Other Ambulatory Visit: Payer: Self-pay | Admitting: Internal Medicine

## 2014-10-01 MED ORDER — POTASSIUM CHLORIDE CRYS ER 20 MEQ PO TBCR: 30.0000 meq | EXTENDED_RELEASE_TABLET | Freq: Every day | ORAL | Status: DC

## 2014-10-03 ENCOUNTER — Other Ambulatory Visit: Payer: Self-pay | Admitting: Internal Medicine

## 2014-10-03 ENCOUNTER — Encounter (HOSPITAL_COMMUNITY): Payer: Self-pay | Admitting: Nurse Practitioner

## 2014-10-03 ENCOUNTER — Ambulatory Visit (HOSPITAL_COMMUNITY)
Admission: RE | Admit: 2014-10-03 | Discharge: 2014-10-03 | Disposition: A | Discharge status: Home / Self Care | Payer: Medicare Other | Source: Ambulatory Visit | Attending: Nurse Practitioner | Admitting: Nurse Practitioner

## 2014-10-03 VITALS — BP 112/86 | HR 139 | Ht 74.0 in | Wt 198.2 lb

## 2014-10-03 DIAGNOSIS — I481 Persistent atrial fibrillation: Secondary | ICD-10-CM | POA: Diagnosis not present

## 2014-10-03 DIAGNOSIS — I4819 Other persistent atrial fibrillation: Secondary | ICD-10-CM

## 2014-10-03 LAB — BASIC METABOLIC PANEL
Anion gap: 6 (ref 5–15)
BUN: 13 mg/dL (ref 6–20)
CO2: 30 mmol/L (ref 22–32)
Calcium: 9.4 mg/dL (ref 8.9–10.3)
Chloride: 103 mmol/L (ref 101–111)
Creatinine, Ser: 1.06 mg/dL (ref 0.61–1.24)
Glucose, Bld: 87 mg/dL (ref 65–99)
POTASSIUM: 3.8 mmol/L (ref 3.5–5.1)
Sodium: 139 mmol/L (ref 135–145)

## 2014-10-03 LAB — CBC
HEMATOCRIT: 41.2 % (ref 39.0–52.0)
Hemoglobin: 13.7 g/dL (ref 13.0–17.0)
MCH: 28.5 pg (ref 26.0–34.0)
MCHC: 33.3 g/dL (ref 30.0–36.0)
MCV: 85.8 fL (ref 78.0–100.0)
Platelets: 232 10*3/uL (ref 150–400)
RBC: 4.8 MIL/uL (ref 4.22–5.81)
RDW: 13.1 % (ref 11.5–15.5)
WBC: 7.7 10*3/uL (ref 4.0–10.5)

## 2014-10-03 NOTE — Progress Notes (Signed)
Patient ID: [name] [name], male   DOB: 06-21-1945, 69 y.o.   MRN: [id number]     Primary Care Physician: [name], MD Referring Physician:   NUR KRASINSKI is a 69 y.o. male with a h/o persistent afib who recently has his 3rd ablation 6/21. He left the hospital in [place] but it returned last pm. He is aware that often times frequent afib will ocur after ablation but his history is that he will not return to [place] unless he is cardioverted. Says he has had over 74 cardioversion's in his 26 years living with afib. Denies any swallowing/ rt groin issues from PVI.   Today, he denies symptoms of palpitations, chest pain, shortness of breath, orthopnea, PND, lower extremity edema, dizziness, presyncope, syncope, or neurologic sequela. The patient is tolerating medications without difficulties and is otherwise without complaint today.   Past Medical History  Diagnosis Date  . Paroxysmal atrial fibrillation     a. s/p afib ablations 2002 and 2009 by Dr Rolland Porter. b. s/p DCCV 02/2013. c. s/p DCCV 01/19/2014  . H/O cardiac catheterization     no significant CAD by cath 5/12  . Cardiomyopathy     a. 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 b. also hx of tachycardia mediated CM, now resolved  . Hypertension   . Hyperlipidemia   . Thoracic aortic aneurysm     a. Borderline enlarged by CT 12/14 at 4.0cm.  . Depression   . OSA (obstructive sleep apnea)     AHI avg 5/hr  . Limb pain 06/20/2007    LE venous doppler - no evidence of thrombus or thrombophlebitis  . PVC (premature ventricular contraction) 12/18/2009    cardionet monitor 21 days - some PVCs, ventricular bigeminy  . Pericardial effusion     a. Since 2007. b. s/p pericardial window 04/2013.  Marland Kitchen Echocardiogram abnormal     EF60%, mild LVH, PA pk pressure 35, pericardial effusion mild increase from 2012   . Complication of anesthesia     Pt has anxiety issues and thinks he is going to die.  . Liver mass     a. CT 03/2013:  Small probable cysts in the liver and small hyperenhancing liver mass.   Past Surgical History  Procedure Laterality Date  . Cardiac catheterization  08/25/10    NO SIGNIFICANT CAD  . Cardioversion  10/18/2007    successful  . Elbow / upper arm foreign body removal    . Transesophageal echocardiogram  08/26/2010    see Notes tab  . Cardiovascular stress test  09/08/2005    R/S MV - EF 61%; Exercises capacity 12 Mets; essentially normal perfusion myocardial scan    . Cardiac electrophysiology mapping and ablation  [id number]    Dr. Tally Due  . Osteosynthesis Left 09/30/2012    with small plate in the ulna  . Cardioversion N/A 02/08/2013    Procedure: CARDIOVERSION;  Surgeon: Troy Sine, MD;  Location: [facility] ENDOSCOPY;  Service: Cardiovascular;  Laterality: N/A;  . Fracture surgery Left     Left wrist  . Subxyphoid pericardial window N/A 04/25/2013    Procedure: SUBXYPHOID PERICARDIAL WINDOW;  Surgeon: Gaye Pollack, MD;  Location: [place] OR;  Service: Thoracic;  Laterality: N/A;  . Carpal tunnel release Left April 2015  . Cardioversion N/A 08/17/2013    Procedure: CARDIOVERSION;  Surgeon: Sanda Klein, MD;  Location: [place];  Service: Cardiovascular;  Laterality: N/A;  . Cardioversion N/A 12/28/2013    Procedure:  CARDIOVERSION;  Surgeon: Fay Records, MD;  Location: [place];  Service: Cardiovascular;  Laterality: N/A;  . Cardioversion N/A 01/19/2014    Procedure: CARDIOVERSION;  Surgeon: Sanda Klein, MD;  Location: [facility] ENDOSCOPY;  Service: Cardiovascular;  Laterality: N/A;  . Cardioversion N/A 03/11/2014    Procedure: CARDIOVERSION;  Surgeon: Evans Lance, MD;  Location: [place];  Service: Cardiovascular;  Laterality: N/A;  . Left and right heart catheterization with coronary angiogram N/A 03/17/2013    Procedure: LEFT AND RIGHT HEART CATHETERIZATION WITH CORONARY ANGIOGRAM;  Surgeon: Troy Sine, MD;  Location: [facility] CATH LAB;  Service: Cardiovascular;  Laterality: N/A;  .  Cardioversion N/A 08/23/2014    Procedure: CARDIOVERSION;  Surgeon: Lelon Perla, MD;  Location: [facility] ENDOSCOPY;  Service: Cardiovascular;  Laterality: N/A;  . Tee without cardioversion N/A 09/24/2014    Procedure: TRANSESOPHAGEAL ECHOCARDIOGRAM (TEE);  Surgeon: Dorothy Spark, MD;  Location: [place];  Service: Cardiovascular;  Laterality: N/A;  . Electrophysiologic study N/A 09/25/2014    Procedure: Atrial Fibrillation Ablation;  Surgeon: Thompson Grayer, MD;  Location: [place] CV LAB;  Service: Cardiovascular;  Laterality: N/A;    Current Outpatient Prescriptions  Medication Sig Dispense Refill  . atorvastatin (LIPITOR) 40 MG tablet Take 1 tablet (40 mg total) by mouth daily. 90 tablet 3  . Cholecalciferol (VITAMIN D) 2000 UNITS CAPS Take 2,000 Units by mouth daily.    . dabigatran (PRADAXA) 150 MG CAPS capsule Take 1 capsule (150 mg total) by mouth 2 (two) times daily. 180 capsule 3  . diltiazem (CARDIZEM CD) 240 MG 24 hr capsule Take 1 capsule (240 mg total) by mouth daily. 90 capsule 3  . dofetilide (TIKOSYN) 500 MCG capsule Take 1 capsule (500 mcg total) by mouth 2 (two) times daily. 180 capsule 4  . finasteride (PROPECIA) 1 MG tablet Take 1 mg by mouth every morning.     . furosemide (LASIX) 40 MG tablet Take 1 tablet (40 mg total) by mouth daily. (Patient taking differently: Take 40 mg by mouth 2 (two) times daily. ) 90 tablet 3  . metoprolol succinate (TOPROL-XL) 50 MG 24 hr tablet Take 1 tablet (50 mg total) by mouth daily. Take with or immediately following a meal. 30 tablet 1  . NON FORMULARY Take 1 capsule by mouth 2 (two) times daily. Urinozinc    . pantoprazole (PROTONIX) 40 MG tablet Take 1 tablet (40 mg total) by mouth at bedtime. 90 tablet 3  . potassium chloride SA (K-DUR,KLOR-CON) 20 MEQ tablet Take 1.5 tablets (30 mEq total) by mouth daily. 45 tablet 9  . Saw Palmetto, Serenoa repens, (SAW PALMETTO PO) Take 900 mg by mouth 2 (two) times daily.      No current  facility-administered medications for this encounter.    Allergies  Allergen Reactions  . Tetracyclines & Related Hives    rash    History   Social History  . Marital Status: Single    Spouse Name: N/A  . Number of Children: 0  . Years of Education: N/A   Occupational History  .  [place] History Main Topics  . Smoking status: Former Smoker -- 1.00 packs/day for 10 years    Quit date: 02/06/1989  . Smokeless tobacco: Never Used  . Alcohol Use: No     Comment: 2 bottles of wine per week, pt states has not drank alcohol since christmas  . Drug Use: No  . Sexual Activity: Not on file  Other Topics Concern  . 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    Family History  Problem Relation Age of Onset  . Coronary artery disease Father   . Stroke Mother     died  . Atrial fibrillation Brother   . Heart attack Father   . Mitral valve prolapse Sister     ROS- All systems are reviewed and negative except as per the HPI above  Physical Exam: Filed Vitals:   10/03/14 1204  BP: 112/86  Pulse: 139  Height: 6\' 2"  (1.88 m)  Weight: 198 lb 3.2 oz (89.903 kg)    GEN- The patient is well appearing, alert and oriented x 3 today.   Head- normocephalic, atraumatic Eyes-  Sclera clear, conjunctiva pink Ears- hearing intact Oropharynx- clear Neck- supple, no JVP Lymph- no cervical lymphadenopathy Lungs- Clear to ausculation bilaterally, normal work of breathing Heart- Rapid, irregular rate and rhythm, no murmurs, rubs or gallops, PMI not laterally displaced GI- soft, NT, ND, + BS Extremities- no clubbing, cyanosis, or edema MS- no significant deformity or atrophy Skin- no rash or lesion Psych- euthymic mood, full affect Neuro- strength and sensation are  intact  EKG-Afib with RVR at 139 bpm. QRS 80 ms, QTc 480 ms Epic records reviewed.   Assessment and Plan: 1. Afib s/p PVI Schedule for cardioversion No missed doses of pradaxa Continue dofetilide, diltiazem/metoprolol Pre procedure labs  2.F/u with afib clinic as scheduled 7/19. Per Dr. Rayann Heman may need f/u Stress myoview based on Cardiac CT. He plans to discuss with Dr. Claiborne Billings   Pt discussed with Dr. Rayann Heman

## 2014-10-03 NOTE — Patient Instructions (Signed)
Cardioversion scheduled for Thursday, June 30th  - Arrive at the [facility] and go to admitting at 1pm  -Do not eat or drink anything after midnight the night prior to your procedure.  - Take all your medication with a sip of water prior to arrival.  - You will not be able to drive home after your procedure.

## 2014-10-04 ENCOUNTER — Ambulatory Visit (HOSPITAL_COMMUNITY): Payer: Medicare Other | Admitting: Anesthesiology

## 2014-10-04 ENCOUNTER — Encounter (HOSPITAL_COMMUNITY): Payer: Self-pay | Admitting: *Deleted

## 2014-10-04 ENCOUNTER — Encounter (HOSPITAL_COMMUNITY)
Admission: RE | Disposition: A | Discharge status: Home / Self Care | Payer: Self-pay | Source: Ambulatory Visit | Attending: Cardiology

## 2014-10-04 ENCOUNTER — Ambulatory Visit (HOSPITAL_COMMUNITY)
Admission: RE | Admit: 2014-10-04 | Discharge: 2014-10-04 | Disposition: A | Discharge status: Home / Self Care | Payer: Medicare Other | Source: Ambulatory Visit | Attending: Cardiology | Admitting: Cardiology

## 2014-10-04 DIAGNOSIS — I10 Essential (primary) hypertension: Secondary | ICD-10-CM | POA: Insufficient documentation

## 2014-10-04 DIAGNOSIS — Z87891 Personal history of nicotine dependence: Secondary | ICD-10-CM | POA: Insufficient documentation

## 2014-10-04 DIAGNOSIS — Z8249 Family history of ischemic heart disease and other diseases of the circulatory system: Secondary | ICD-10-CM | POA: Insufficient documentation

## 2014-10-04 DIAGNOSIS — G4733 Obstructive sleep apnea (adult) (pediatric): Secondary | ICD-10-CM | POA: Insufficient documentation

## 2014-10-04 DIAGNOSIS — I4891 Unspecified atrial fibrillation: Secondary | ICD-10-CM | POA: Diagnosis not present

## 2014-10-04 DIAGNOSIS — I481 Persistent atrial fibrillation: Secondary | ICD-10-CM

## 2014-10-04 DIAGNOSIS — E785 Hyperlipidemia, unspecified: Secondary | ICD-10-CM | POA: Diagnosis not present

## 2014-10-04 DIAGNOSIS — I4819 Other persistent atrial fibrillation: Secondary | ICD-10-CM

## 2014-10-04 HISTORY — PX: CARDIOVERSION: SHX1299

## 2014-10-04 SURGERY — CARDIOVERSION
Anesthesia: Monitor Anesthesia Care

## 2014-10-04 MED ORDER — PROPOFOL 10 MG/ML IV BOLUS
INTRAVENOUS | Status: DC | PRN
  Administered 2014-10-04: 50 mg via INTRAVENOUS

## 2014-10-04 MED ORDER — SODIUM CHLORIDE 0.9 % IV SOLN
INTRAVENOUS | Status: DC
  Administered 2014-10-04: 14:00:00 via INTRAVENOUS

## 2014-10-04 MED ORDER — LIDOCAINE HCL (CARDIAC) 20 MG/ML IV SOLN
INTRAVENOUS | Status: DC | PRN
  Administered 2014-10-04: 40 mg via INTRAVENOUS

## 2014-10-04 MED ORDER — FUROSEMIDE 40 MG PO TABS: 40.0000 mg | ORAL_TABLET | Freq: Two times a day (BID) | ORAL | Status: DC

## 2014-10-04 NOTE — Anesthesia Procedure Notes (Signed)
Procedure Name: MAC Date/Time: 10/04/2014 1:41 PM Performed by: Eligha Bridegroom Pre-anesthesia Checklist: Patient identified, Timeout performed, Emergency Drugs available, Suction available and Patient being monitored Patient Re-evaluated:Patient Re-evaluated prior to inductionOxygen Delivery Method: Ambu bag Preoxygenation: Pre-oxygenation with 100% oxygen Intubation Type: IV induction Ventilation: Mask ventilation without difficulty

## 2014-10-04 NOTE — Discharge Instructions (Signed)

## 2014-10-04 NOTE — CV Procedure (Signed)
   Electrical Cardioversion Procedure Note [name] [name] [id number] Feb 12, 1946  Procedure: Electrical Cardioversion Indications:  Atrial Fibrillation  Time Out: Verified patient identification, verified procedure,medications/allergies/relevent history reviewed, required imaging and test results available.  Performed  Procedure Details  The patient was NPO after midnight. Anesthesia was administered at the beside  by Dr.Rose with 50mg  of propofol and Lidocaine 40mg .  Cardioversion was done with synchronized biphasic defibrillation with AP pads with 150watts.  The patient converted to normal sinus rhythm. The patient tolerated the procedure well   IMPRESSION:  Successful cardioversion of atrial fibrillation    [name] [name] R 10/04/2014, 1:40 PM

## 2014-10-04 NOTE — Anesthesia Preprocedure Evaluation (Signed)
Anesthesia Evaluation  Patient identified by MRN, date of birth, ID band Patient awake    Reviewed: Allergy & Precautions, NPO status , Patient's Chart, lab work & pertinent test results  Airway Mallampati: II  TM Distance: >3 FB Neck ROM: Full    Dental no notable dental hx.    Pulmonary sleep apnea , former smoker,  breath sounds clear to auscultation  Pulmonary exam normal       Cardiovascular hypertension, Pt. on medications and Pt. on home beta blockers + Peripheral Vascular Disease Normal cardiovascular exam+ dysrhythmias Atrial Fibrillation Rhythm:Irregular Rate:Normal     Neuro/Psych negative neurological ROS  negative psych ROS   GI/Hepatic negative GI ROS, Neg liver ROS,   Endo/Other  negative endocrine ROS  Renal/GU negative Renal ROS  negative genitourinary   Musculoskeletal negative musculoskeletal ROS (+)   Abdominal   Peds negative pediatric ROS (+)  Hematology negative hematology ROS (+)   Anesthesia Other Findings   Reproductive/Obstetrics negative OB ROS                             Anesthesia Physical Anesthesia Plan  ASA: III  Anesthesia Plan: MAC   Post-op Pain Management:    Induction: Intravenous  Airway Management Planned: Mask  Additional Equipment:   Intra-op Plan:   Post-operative Plan: Extubation in OR  Informed Consent: I have reviewed the patients History and Physical, chart, labs and discussed the procedure including the risks, benefits and alternatives for the proposed anesthesia with the patient or authorized representative who has indicated his/her understanding and acceptance.   Dental advisory given  Plan Discussed with: CRNA and Surgeon  Anesthesia Plan Comments:         Anesthesia Quick Evaluation

## 2014-10-04 NOTE — H&P (View-Only) (Signed)
Patient ID: [name] [name], male   DOB: 02/17/1946, 69 y.o.   MRN: [id number]     Primary Care Physician: [name], MD Referring Physician:   DAVONTAE PRUSINSKI is a 69 y.o. male with a h/o persistent afib who recently has his 3rd ablation 6/21. He left the hospital in [place] but it returned last pm. He is aware that often times frequent afib will ocur after ablation but his history is that he will not return to [place] unless he is cardioverted. Says he has had over 63 cardioversion's in his 26 years living with afib. Denies any swallowing/ rt groin issues from PVI.   Today, he denies symptoms of palpitations, chest pain, shortness of breath, orthopnea, PND, lower extremity edema, dizziness, presyncope, syncope, or neurologic sequela. The patient is tolerating medications without difficulties and is otherwise without complaint today.   Past Medical History  Diagnosis Date  . Paroxysmal atrial fibrillation     a. s/p afib ablations 2002 and 2009 by Dr Rolland Porter. b. s/p DCCV 02/2013. c. s/p DCCV 01/19/2014  . H/O cardiac catheterization     no significant CAD by cath 5/12  . Cardiomyopathy     a. 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 b. also hx of tachycardia mediated CM, now resolved  . Hypertension   . Hyperlipidemia   . Thoracic aortic aneurysm     a. Borderline enlarged by CT 12/14 at 4.0cm.  . Depression   . OSA (obstructive sleep apnea)     AHI avg 5/hr  . Limb pain 06/20/2007    LE venous doppler - no evidence of thrombus or thrombophlebitis  . PVC (premature ventricular contraction) 12/18/2009    cardionet monitor 21 days - some PVCs, ventricular bigeminy  . Pericardial effusion     a. Since 2007. b. s/p pericardial window 04/2013.  Marland Kitchen Echocardiogram abnormal     EF60%, mild LVH, PA pk pressure 35, pericardial effusion mild increase from 2012   . Complication of anesthesia     Pt has anxiety issues and thinks he is going to die.  . Liver mass     a. CT 03/2013:  Small probable cysts in the liver and small hyperenhancing liver mass.   Past Surgical History  Procedure Laterality Date  . Cardiac catheterization  08/25/10    NO SIGNIFICANT CAD  . Cardioversion  10/18/2007    successful  . Elbow / upper arm foreign body removal    . Transesophageal echocardiogram  08/26/2010    see Notes tab  . Cardiovascular stress test  09/08/2005    R/S MV - EF 61%; Exercises capacity 12 Mets; essentially normal perfusion myocardial scan    . Cardiac electrophysiology mapping and ablation  [id number]    Dr. Tally Due  . Osteosynthesis Left 09/30/2012    with small plate in the ulna  . Cardioversion N/A 02/08/2013    Procedure: CARDIOVERSION;  Surgeon: Troy Sine, MD;  Location: [facility] ENDOSCOPY;  Service: Cardiovascular;  Laterality: N/A;  . Fracture surgery Left     Left wrist  . Subxyphoid pericardial window N/A 04/25/2013    Procedure: SUBXYPHOID PERICARDIAL WINDOW;  Surgeon: Gaye Pollack, MD;  Location: [place] OR;  Service: Thoracic;  Laterality: N/A;  . Carpal tunnel release Left April 2015  . Cardioversion N/A 08/17/2013    Procedure: CARDIOVERSION;  Surgeon: Sanda Klein, MD;  Location: [place];  Service: Cardiovascular;  Laterality: N/A;  . Cardioversion N/A 12/28/2013    Procedure:  CARDIOVERSION;  Surgeon: Fay Records, MD;  Location: [place];  Service: Cardiovascular;  Laterality: N/A;  . Cardioversion N/A 01/19/2014    Procedure: CARDIOVERSION;  Surgeon: Sanda Klein, MD;  Location: [facility] ENDOSCOPY;  Service: Cardiovascular;  Laterality: N/A;  . Cardioversion N/A 03/11/2014    Procedure: CARDIOVERSION;  Surgeon: Evans Lance, MD;  Location: [place];  Service: Cardiovascular;  Laterality: N/A;  . Left and right heart catheterization with coronary angiogram N/A 03/17/2013    Procedure: LEFT AND RIGHT HEART CATHETERIZATION WITH CORONARY ANGIOGRAM;  Surgeon: Troy Sine, MD;  Location: [facility] CATH LAB;  Service: Cardiovascular;  Laterality: N/A;  .  Cardioversion N/A 08/23/2014    Procedure: CARDIOVERSION;  Surgeon: Lelon Perla, MD;  Location: [facility] ENDOSCOPY;  Service: Cardiovascular;  Laterality: N/A;  . Tee without cardioversion N/A 09/24/2014    Procedure: TRANSESOPHAGEAL ECHOCARDIOGRAM (TEE);  Surgeon: Dorothy Spark, MD;  Location: [place];  Service: Cardiovascular;  Laterality: N/A;  . Electrophysiologic study N/A 09/25/2014    Procedure: Atrial Fibrillation Ablation;  Surgeon: Thompson Grayer, MD;  Location: [place] CV LAB;  Service: Cardiovascular;  Laterality: N/A;    Current Outpatient Prescriptions  Medication Sig Dispense Refill  . atorvastatin (LIPITOR) 40 MG tablet Take 1 tablet (40 mg total) by mouth daily. 90 tablet 3  . Cholecalciferol (VITAMIN D) 2000 UNITS CAPS Take 2,000 Units by mouth daily.    . dabigatran (PRADAXA) 150 MG CAPS capsule Take 1 capsule (150 mg total) by mouth 2 (two) times daily. 180 capsule 3  . diltiazem (CARDIZEM CD) 240 MG 24 hr capsule Take 1 capsule (240 mg total) by mouth daily. 90 capsule 3  . dofetilide (TIKOSYN) 500 MCG capsule Take 1 capsule (500 mcg total) by mouth 2 (two) times daily. 180 capsule 4  . finasteride (PROPECIA) 1 MG tablet Take 1 mg by mouth every morning.     . furosemide (LASIX) 40 MG tablet Take 1 tablet (40 mg total) by mouth daily. (Patient taking differently: Take 40 mg by mouth 2 (two) times daily. ) 90 tablet 3  . metoprolol succinate (TOPROL-XL) 50 MG 24 hr tablet Take 1 tablet (50 mg total) by mouth daily. Take with or immediately following a meal. 30 tablet 1  . NON FORMULARY Take 1 capsule by mouth 2 (two) times daily. Urinozinc    . pantoprazole (PROTONIX) 40 MG tablet Take 1 tablet (40 mg total) by mouth at bedtime. 90 tablet 3  . potassium chloride SA (K-DUR,KLOR-CON) 20 MEQ tablet Take 1.5 tablets (30 mEq total) by mouth daily. 45 tablet 9  . Saw Palmetto, Serenoa repens, (SAW PALMETTO PO) Take 900 mg by mouth 2 (two) times daily.      No current  facility-administered medications for this encounter.    Allergies  Allergen Reactions  . Tetracyclines & Related Hives    rash    History   Social History  . Marital Status: Single    Spouse Name: N/A  . Number of Children: 0  . Years of Education: N/A   Occupational History  .  [employer] History Main Topics  . Smoking status: Former Smoker -- 1.00 packs/day for 10 years    Quit date: 02/06/1989  . Smokeless tobacco: Never Used  . Alcohol Use: No     Comment: 2 bottles of wine per week, pt states has not drank alcohol since christmas  . Drug Use: No  . Sexual Activity: Not on file  Other Topics Concern  . 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    Family History  Problem Relation Age of Onset  . Coronary artery disease Father   . Stroke Mother     died  . Atrial fibrillation Brother   . Heart attack Father   . Mitral valve prolapse Sister     ROS- All systems are reviewed and negative except as per the HPI above  Physical Exam: Filed Vitals:   10/03/14 1204  BP: 112/86  Pulse: 139  Height: 6\' 2"  (1.88 m)  Weight: 198 lb 3.2 oz (89.903 kg)    GEN- The patient is well appearing, alert and oriented x 3 today.   Head- normocephalic, atraumatic Eyes-  Sclera clear, conjunctiva pink Ears- hearing intact Oropharynx- clear Neck- supple, no JVP Lymph- no cervical lymphadenopathy Lungs- Clear to ausculation bilaterally, normal work of breathing Heart- Rapid, irregular rate and rhythm, no murmurs, rubs or gallops, PMI not laterally displaced GI- soft, NT, ND, + BS Extremities- no clubbing, cyanosis, or edema MS- no significant deformity or atrophy Skin- no rash or lesion Psych- euthymic mood, full affect Neuro- strength and sensation are  intact  EKG-Afib with RVR at 139 bpm. QRS 80 ms, QTc 480 ms Epic records reviewed.   Assessment and Plan: 1. Afib s/p PVI Schedule for cardioversion No missed doses of pradaxa Continue dofetilide, diltiazem/metoprolol Pre procedure labs  2.F/u with afib clinic as scheduled 7/19. Per Dr. Rayann Heman may need f/u Stress myoview based on Cardiac CT. He plans to discuss with Dr. Claiborne Billings   Pt discussed with Dr. Rayann Heman

## 2014-10-04 NOTE — Transfer of Care (Signed)
Immediate Anesthesia Transfer of Care Note  Patient: [name] [name]  Procedure(s) Performed: Procedure(s): CARDIOVERSION (N/A)  Patient Location: PACU and Endoscopy Unit  Anesthesia Type:MAC  Level of Consciousness: awake, alert  and oriented  Airway & Oxygen Therapy: Patient Spontanous Breathing and Patient connected to nasal cannula oxygen  Post-op Assessment: Report given to RN and Post -op Vital signs reviewed and stable  Post vital signs: Reviewed and stable  Last Vitals:  Filed Vitals:   10/04/14 1348  BP:   Pulse:   Temp:   Resp: 26    Complications: No apparent anesthesia complications

## 2014-10-04 NOTE — Interval H&P Note (Signed)
History and Physical Interval Note:  10/04/2014 1:37 PM  [name] [name]  has presented today for surgery, with the diagnosis of AFIB  The various methods of treatment have been discussed with the patient and family. After consideration of risks, benefits and other options for treatment, the patient has consented to  Procedure(s): CARDIOVERSION (N/A) as a surgical intervention .  The patient's history has been reviewed, patient examined, no change in status, stable for surgery.  I have reviewed the patient's chart and labs.  Questions were answered to the patient's satisfaction.     [name]

## 2014-10-05 ENCOUNTER — Encounter (HOSPITAL_COMMUNITY): Payer: Self-pay | Admitting: Cardiology

## 2014-10-05 NOTE — Anesthesia Postprocedure Evaluation (Signed)
  Anesthesia Post-op Note  Patient: [name] [name]  Procedure(s) Performed: Procedure(s) (LRB): CARDIOVERSION (N/A)  Patient Location: PACU  Anesthesia Type: MAC  Level of Consciousness: awake and alert   Airway and Oxygen Therapy: Patient Spontanous Breathing  Post-op Pain: mild  Post-op Assessment: Post-op Vital signs reviewed, Patient's Cardiovascular Status Stable, Respiratory Function Stable, Patent Airway and No signs of Nausea or vomiting  Last Vitals:  Filed Vitals:   10/04/14 1406  BP: 107/70  Pulse: 69  Temp:   Resp: 20    Post-op Vital Signs: stable   Complications: No apparent anesthesia complications

## 2014-10-10 ENCOUNTER — Other Ambulatory Visit: Payer: Self-pay | Admitting: Cardiology

## 2014-10-10 ENCOUNTER — Other Ambulatory Visit: Payer: Self-pay | Admitting: Internal Medicine

## 2014-10-10 NOTE — Telephone Encounter (Signed)
Rx(s) sent to pharmacy electronically.  

## 2014-10-23 ENCOUNTER — Ambulatory Visit (HOSPITAL_COMMUNITY)
Admission: RE | Admit: 2014-10-23 | Discharge: 2014-10-23 | Disposition: A | Discharge status: Home / Self Care | Payer: Medicare Other | Source: Ambulatory Visit | Attending: Nurse Practitioner | Admitting: Nurse Practitioner

## 2014-10-23 ENCOUNTER — Encounter (HOSPITAL_COMMUNITY): Payer: Self-pay | Admitting: Nurse Practitioner

## 2014-10-23 VITALS — BP 128/72 | HR 71 | Ht 74.0 in | Wt 196.0 lb

## 2014-10-23 DIAGNOSIS — I4819 Other persistent atrial fibrillation: Secondary | ICD-10-CM

## 2014-10-23 DIAGNOSIS — I481 Persistent atrial fibrillation: Secondary | ICD-10-CM | POA: Diagnosis not present

## 2014-10-23 NOTE — Addendum Note (Signed)
Encounter addended by: Sherran Needs, NP on: 10/23/2014  3:00 PM<BR>     Documentation filed: Notes Section

## 2014-10-23 NOTE — Progress Notes (Addendum)
Patient ID: [name] [name], male   DOB: 12-18-1945, 69 y.o.   MRN: [id number]     Primary Care Physician: [name], MD Referring Physician: Dr. Macon Large is a 69 y.o. male with a h/o persistent afib who recently has his 3rd ablation 6/21. He left the hospital in [place] but it returned.Marland Kitchen He was aware that often times frequent afib will ocur after ablation but his history is that he will not return to SR unless he is cardioverted. Says he has had over 69 cardioversion's in his 26 years living with afib. Denies any swallowing/ rt groin issues from PVI. He is f/u today from [place] which was successful and pt is feeling well.  Today, he denies symptoms of palpitations, chest pain, shortness of breath, orthopnea, PND, lower extremity edema, dizziness, presyncope, syncope, or neurologic sequela. The patient is tolerating medications without difficulties and is otherwise without complaint today.   Past Medical History  Diagnosis Date  . Paroxysmal atrial fibrillation     a. s/p afib ablations 2002 and 2009 by Dr Rolland Porter. b. s/p DCCV 02/2013. c. s/p DCCV 01/19/2014  . H/O cardiac catheterization     no significant CAD by cath 5/12  . Cardiomyopathy     a. 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 b. also hx of tachycardia mediated CM, now resolved  . Hypertension   . Hyperlipidemia   . Thoracic aortic aneurysm     a. Borderline enlarged by CT 12/14 at 4.0cm.  . Depression   . OSA (obstructive sleep apnea)     AHI avg 5/hr  . Limb pain 06/20/2007    LE venous doppler - no evidence of thrombus or thrombophlebitis  . PVC (premature ventricular contraction) 12/18/2009    cardionet monitor 21 days - some PVCs, ventricular bigeminy  . Pericardial effusion     a. Since 2007. b. s/p pericardial window 04/2013.  Marland Kitchen Echocardiogram abnormal     EF60%, mild LVH, PA pk pressure 35, pericardial effusion mild increase from 2012   . Complication of anesthesia     Pt has anxiety  issues and thinks he is going to die.  . Liver mass     a. CT 03/2013: Small probable cysts in the liver and small hyperenhancing liver mass.   Past Surgical History  Procedure Laterality Date  . Cardiac catheterization  08/25/10    NO SIGNIFICANT CAD  . Cardioversion  10/18/2007    successful  . Elbow / upper arm foreign body removal    . Transesophageal echocardiogram  08/26/2010    see Notes tab  . Cardiovascular stress test  09/08/2005    R/S MV - EF 61%; Exercises capacity 12 Mets; essentially normal perfusion myocardial scan    . Cardiac electrophysiology mapping and ablation  [id number]    Dr. Tally Due  . Osteosynthesis Left 09/30/2012    with small plate in the ulna  . Cardioversion N/A 02/08/2013    Procedure: CARDIOVERSION;  Surgeon: Troy Sine, MD;  Location: [facility] ENDOSCOPY;  Service: Cardiovascular;  Laterality: N/A;  . Fracture surgery Left     Left wrist  . Subxyphoid pericardial window N/A 04/25/2013    Procedure: SUBXYPHOID PERICARDIAL WINDOW;  Surgeon: Gaye Pollack, MD;  Location: [place] OR;  Service: Thoracic;  Laterality: N/A;  . Carpal tunnel release Left April 2015  . Cardioversion N/A 08/17/2013    Procedure: CARDIOVERSION;  Surgeon: Sanda Klein, MD;  Location: [place];  Service:  Cardiovascular;  Laterality: N/A;  . Cardioversion N/A 12/28/2013    Procedure: CARDIOVERSION;  Surgeon: Fay Records, MD;  Location: [place];  Service: Cardiovascular;  Laterality: N/A;  . Cardioversion N/A 01/19/2014    Procedure: CARDIOVERSION;  Surgeon: Sanda Klein, MD;  Location: [facility] ENDOSCOPY;  Service: Cardiovascular;  Laterality: N/A;  . Cardioversion N/A 03/11/2014    Procedure: CARDIOVERSION;  Surgeon: Evans Lance, MD;  Location: [place];  Service: Cardiovascular;  Laterality: N/A;  . Left and right heart catheterization with coronary angiogram N/A 03/17/2013    Procedure: LEFT AND RIGHT HEART CATHETERIZATION WITH CORONARY ANGIOGRAM;  Surgeon: Troy Sine, MD;   Location: [facility] CATH LAB;  Service: Cardiovascular;  Laterality: N/A;  . Cardioversion N/A 08/23/2014    Procedure: CARDIOVERSION;  Surgeon: Lelon Perla, MD;  Location: [facility] ENDOSCOPY;  Service: Cardiovascular;  Laterality: N/A;  . Tee without cardioversion N/A 09/24/2014    Procedure: TRANSESOPHAGEAL ECHOCARDIOGRAM (TEE);  Surgeon: Dorothy Spark, MD;  Location: [place];  Service: Cardiovascular;  Laterality: N/A;  . Electrophysiologic study N/A 09/25/2014    Procedure: Atrial Fibrillation Ablation;  Surgeon: Thompson Grayer, MD;  Location: [place] CV LAB;  Service: Cardiovascular;  Laterality: N/A;  . Cardioversion N/A 10/04/2014    Procedure: CARDIOVERSION;  Surgeon: Sueanne Margarita, MD;  Location: [facility] ENDOSCOPY;  Service: Cardiovascular;  Laterality: N/A;    Current Outpatient Prescriptions  Medication Sig Dispense Refill  . atorvastatin (LIPITOR) 40 MG tablet Take 1 tablet (40 mg total) by mouth daily. 90 tablet 3  . Cholecalciferol (VITAMIN D) 2000 UNITS CAPS Take 2,000 Units by mouth daily.    Marland Kitchen diltiazem (CARDIZEM CD) 240 MG 24 hr capsule TAKE ONE CAPSULE BY MOUTH ONCE DAILY--FILLABLE ON 7/25 90 capsule 0  . dofetilide (TIKOSYN) 500 MCG capsule Take 1 capsule (500 mcg total) by mouth 2 (two) times daily. 180 capsule 4  . finasteride (PROPECIA) 1 MG tablet Take 1 mg by mouth every morning.     . furosemide (LASIX) 40 MG tablet TAKE 1 TABLET BY MOUTH TWICE A DAY 180 tablet 3  . furosemide (LASIX) 40 MG tablet Take 1 tablet (40 mg total) by mouth 2 (two) times daily. 90 tablet 3  . KLOR-CON M20 20 MEQ tablet TAKE 1 & 1/2 TABLETS BY MOUTH ONCE A DAY 135 tablet 3  . metoprolol succinate (TOPROL-XL) 50 MG 24 hr tablet Take 1 tablet (50 mg total) by mouth daily. Take with or immediately following a meal. 30 tablet 1  . NON FORMULARY Take 1 capsule by mouth 2 (two) times daily. Urinozinc    . pantoprazole (PROTONIX) 40 MG tablet Take 1 tablet (40 mg total) by mouth daily. Patient needs to  contact office for and appointment 90 tablet 3  . potassium chloride SA (K-DUR,KLOR-CON) 20 MEQ tablet Take 1.5 tablets (30 mEq total) by mouth daily. 45 tablet 9  . PRADAXA 150 MG CAPS capsule TAKE ONE CAPSULE BY MOUTH TWICE A DAY 180 capsule 1  . Saw Palmetto, Serenoa repens, (SAW PALMETTO PO) Take 900 mg by mouth 2 (two) times daily.      No current facility-administered medications for this encounter.    Allergies  Allergen Reactions  . Tetracyclines & Related Hives    rash    History   Social History  . Marital Status: Single    Spouse Name: N/A  . Number of Children: 0  . Years of Education: N/A   Occupational History  .  [employer]   Social History Main Topics  . Smoking status: Former Smoker -- 1.00 packs/day for 10 years    Quit date: 02/06/1989  . Smokeless tobacco: Never Used  . Alcohol Use: No     Comment: 2 bottles of wine per week, pt states has not drank alcohol since christmas  . Drug Use: No  . Sexual Activity: Not on file   Other Topics Concern  . 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    Family History  Problem Relation Age of Onset  . Coronary artery disease Father   . Stroke Mother     died  . Atrial fibrillation Brother   . Heart attack Father   . Mitral valve prolapse Sister     ROS- All systems are reviewed and negative except as per the HPI above  Physical Exam: Filed Vitals:   10/23/14 1421  BP: 128/72  Pulse: 71  Height: 6\' 2"  (1.88 m)  Weight: 196 lb (88.905 kg)    GEN- The patient is well appearing, alert and oriented x 3 today.   Head- normocephalic, atraumatic Eyes-  Sclera clear, conjunctiva pink Ears- hearing intact Oropharynx- clear Neck- supple, no JVP Lymph- no cervical lymphadenopathy Lungs- Clear to ausculation  bilaterally, normal work of breathing Heart- Rapid, irregular rate and rhythm, no murmurs, rubs or gallops, PMI not laterally displaced GI- soft, NT, ND, + BS Extremities- no clubbing, cyanosis, or edema MS- no significant deformity or atrophy Skin- no rash or lesion Psych- euthymic mood, full affect Neuro- strength and sensation are intact  EKG-SR at 71 bpm, QTc 445 ms Epic records reviewed.   Assessment and Plan: 1. 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 Rayann Heman would like to pursue nuclear stress test to further evaluate for ischemia    F/u with Dr. Rayann Heman as scheduled 9/21

## 2014-10-30 ENCOUNTER — Telehealth (HOSPITAL_COMMUNITY): Payer: Self-pay

## 2014-10-30 NOTE — Telephone Encounter (Signed)
I spoke with Roderic Palau, NP who verified the patient is to take all  medications as usual including Toprol. Do not hold Toprol for the test. Patient to attempt treadmill if in NSR, but if he can not reach target heart rate ok to change to [place]. Of  Note,when the patient goes into AFIB only converts with cardioversion per Roderic Palau, NP. Patient given detailed instructions per Myocardial Perfusion Study Information Sheet for test on 11-05-2014 at 0930. Patient Notified to arrive 15 minutes early, and that it is imperative to arrive on time for appointment to keep from having the test rescheduled. Patient verbalized understanding. Oletta Lamas, Maryn Freelove A

## 2014-11-05 ENCOUNTER — Ambulatory Visit (HOSPITAL_COMMUNITY): Payer: Medicare Other | Attending: Cardiovascular Disease

## 2014-11-05 DIAGNOSIS — I4819 Other persistent atrial fibrillation: Secondary | ICD-10-CM

## 2014-11-05 DIAGNOSIS — I481 Persistent atrial fibrillation: Secondary | ICD-10-CM | POA: Diagnosis present

## 2014-11-05 MED ORDER — TECHNETIUM TC 99M SESTAMIBI GENERIC - CARDIOLITE
30.6000 | Freq: Once | INTRAVENOUS | Status: AC | PRN
  Administered 2014-11-05: 31 via INTRAVENOUS

## 2014-11-05 MED ORDER — TECHNETIUM TC 99M SESTAMIBI GENERIC - CARDIOLITE
10.8000 | Freq: Once | INTRAVENOUS | Status: AC | PRN
  Administered 2014-11-05: 11 via INTRAVENOUS

## 2014-11-06 LAB — MYOCARDIAL PERFUSION IMAGING
CHL CUP MPHR: 152 {beats}/min
CHL RATE OF PERCEIVED EXERTION: 15
CSEPEW: 7 METS
Exercise duration (min): 7 min
LHR: 0.26
LV dias vol: 103 mL
LV sys vol: 43 mL
Peak HR: 150 {beats}/min
Percent HR: 98 %
Rest HR: 75 {beats}/min
SDS: 6
SRS: 0
SSS: 6
TID: 0.81

## 2014-12-01 ENCOUNTER — Other Ambulatory Visit: Payer: Self-pay | Admitting: Internal Medicine

## 2014-12-26 ENCOUNTER — Encounter: Payer: Self-pay | Admitting: Internal Medicine

## 2014-12-26 ENCOUNTER — Ambulatory Visit (INDEPENDENT_AMBULATORY_CARE_PROVIDER_SITE_OTHER): Payer: Medicare Other | Admitting: Internal Medicine

## 2014-12-26 VITALS — BP 114/80 | HR 64 | Ht 72.0 in | Wt 188.8 lb

## 2014-12-26 DIAGNOSIS — I480 Paroxysmal atrial fibrillation: Secondary | ICD-10-CM

## 2014-12-26 DIAGNOSIS — I4819 Other persistent atrial fibrillation: Secondary | ICD-10-CM

## 2014-12-26 DIAGNOSIS — I481 Persistent atrial fibrillation: Secondary | ICD-10-CM | POA: Diagnosis not present

## 2014-12-26 DIAGNOSIS — I10 Essential (primary) hypertension: Secondary | ICD-10-CM

## 2014-12-26 MED ORDER — DILTIAZEM HCL ER COATED BEADS 180 MG PO CP24: 180.0000 mg | ORAL_CAPSULE | Freq: Every day | ORAL | Status: DC

## 2014-12-26 MED ORDER — METOPROLOL SUCCINATE ER 25 MG PO TB24: 12.5000 mg | ORAL_TABLET | Freq: Every day | ORAL | Status: DC

## 2014-12-26 NOTE — Progress Notes (Signed)
Electrophysiology Office Note   Date:  12/26/2014   ID:  [name], DOB [birth year], MRN [id number]  PCP:  [name], MD  Cardiologist:  Dr Claiborne Billings Primary Electrophysiologist: Thompson Grayer, MD    Chief Complaint  Patient presents with  . Persistent AFIB     History of Present Illness: [name] [name] is a 69 y.o. male who presents today for electrophysiology evaluation.  He is very happy with the results of his ablation.  He denies procedure related complications.  He is pleased with his results.  Today, he denies symptoms of palpitations, chest pain,  orthopnea, PND, lower extremity edema, claudication, dizziness, presyncope, syncope, bleeding, or neurologic sequela.    Past Medical History  Diagnosis Date  . Paroxysmal atrial fibrillation     a. s/p afib ablations 2002 and 2009 by Dr Rolland Porter. b. s/p DCCV 02/2013. c. s/p DCCV 01/19/2014  . H/O cardiac catheterization     no significant CAD by cath 5/12  . Cardiomyopathy     a. 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 b. also hx of tachycardia mediated CM, now resolved  . Hypertension   . Hyperlipidemia   . Thoracic aortic aneurysm     a. Borderline enlarged by CT 12/14 at 4.0cm.  . Depression   . OSA (obstructive sleep apnea)     AHI avg 5/hr  . Limb pain 06/20/2007    LE venous doppler - no evidence of thrombus or thrombophlebitis  . PVC (premature ventricular contraction) 12/18/2009    cardionet monitor 21 days - some PVCs, ventricular bigeminy  . Pericardial effusion     a. Since 2007. b. s/p pericardial window 04/2013.  Marland Kitchen Echocardiogram abnormal     EF60%, mild LVH, PA pk pressure 35, pericardial effusion mild increase from 2012   . Complication of anesthesia     Pt has anxiety issues and thinks he is going to die.  . Liver mass     a. CT 03/2013: Small probable cysts in the liver and small hyperenhancing liver mass.   Past Surgical History  Procedure Laterality Date  . Cardiac  catheterization  08/25/10    NO SIGNIFICANT CAD  . Cardioversion  10/18/2007    successful  . Elbow / upper arm foreign body removal    . Transesophageal echocardiogram  08/26/2010    see Notes tab  . Cardiovascular stress test  09/08/2005    R/S MV - EF 61%; Exercises capacity 12 Mets; essentially normal perfusion myocardial scan    . Cardiac electrophysiology mapping and ablation  [id number]    Dr. Tally Due  . Osteosynthesis Left 09/30/2012    with small plate in the ulna  . Cardioversion N/A 02/08/2013    Procedure: CARDIOVERSION;  Surgeon: Troy Sine, MD;  Location: [facility] ENDOSCOPY;  Service: Cardiovascular;  Laterality: N/A;  . Fracture surgery Left     Left wrist  . Subxyphoid pericardial window N/A 04/25/2013    Procedure: SUBXYPHOID PERICARDIAL WINDOW;  Surgeon: Gaye Pollack, MD;  Location: [place] OR;  Service: Thoracic;  Laterality: N/A;  . Carpal tunnel release Left April 2015  . Cardioversion N/A 08/17/2013    Procedure: CARDIOVERSION;  Surgeon: Sanda Klein, MD;  Location: [place];  Service: Cardiovascular;  Laterality: N/A;  . Cardioversion N/A 12/28/2013    Procedure: CARDIOVERSION;  Surgeon: Fay Records, MD;  Location: [place];  Service: Cardiovascular;  Laterality: N/A;  . Cardioversion N/A 01/19/2014    Procedure: CARDIOVERSION;  Surgeon: Sanda Klein, MD;  Location: [facility] ENDOSCOPY;  Service: Cardiovascular;  Laterality: N/A;  . Cardioversion N/A 03/11/2014    Procedure: CARDIOVERSION;  Surgeon: Evans Lance, MD;  Location: [place];  Service: Cardiovascular;  Laterality: N/A;  . Left and right heart catheterization with coronary angiogram N/A 03/17/2013    Procedure: LEFT AND RIGHT HEART CATHETERIZATION WITH CORONARY ANGIOGRAM;  Surgeon: Troy Sine, MD;  Location: [facility] CATH LAB;  Service: Cardiovascular;  Laterality: N/A;  . Cardioversion N/A 08/23/2014    Procedure: CARDIOVERSION;  Surgeon: Lelon Perla, MD;  Location: [facility] ENDOSCOPY;  Service:  Cardiovascular;  Laterality: N/A;  . Tee without cardioversion N/A 09/24/2014    Procedure: TRANSESOPHAGEAL ECHOCARDIOGRAM (TEE);  Surgeon: Dorothy Spark, MD;  Location: [facility] ENDOSCOPY;  Service: Cardiovascular;  Laterality: N/A;  . Electrophysiologic study N/A 09/25/2014    Atrial fibrillation ablation performed by Dr Rayann Heman  . Cardioversion N/A 10/04/2014    Procedure: CARDIOVERSION;  Surgeon: Sueanne Margarita, MD;  Location: [place] ENDOSCOPY;  Service: Cardiovascular;  Laterality: N/A;     Current Outpatient Prescriptions  Medication Sig Dispense Refill  . atorvastatin (LIPITOR) 40 MG tablet TAKE 1 TABLET BY MOUTH ONCE DAILY 90 tablet 0  . CALCIUM-MAGNESIUM-ZINC PO Take 1 capsule by mouth daily.    . Cholecalciferol (VITAMIN D) 2000 UNITS CAPS Take 2,000 Units by mouth daily.    Marland Kitchen diltiazem (CARDIZEM CD) 180 MG 24 hr capsule Take 1 capsule (180 mg total) by mouth daily. 90 capsule 3  . dofetilide (TIKOSYN) 500 MCG capsule Take 1 capsule (500 mcg total) by mouth 2 (two) times daily. 180 capsule 4  . finasteride (PROPECIA) 1 MG tablet Take 1 mg by mouth every morning.     . furosemide (LASIX) 40 MG tablet Take 1 tablet (40 mg total) by mouth 2 (two) times daily. 90 tablet 3  . [START ON 02/07/2015] metoprolol succinate (TOPROL-XL) 25 MG 24 hr tablet Take 0.5 tablets (12.5 mg total) by mouth daily. Take with or immediately following a meal. 45 tablet 3  . pantoprazole (PROTONIX) 40 MG tablet Take 1 tablet (40 mg total) by mouth daily. Patient needs to contact office for and appointment 90 tablet 3  . potassium chloride SA (K-DUR,KLOR-CON) 20 MEQ tablet Take 1.5 tablets (30 mEq total) by mouth daily. 45 tablet 9  . PRADAXA 150 MG CAPS capsule TAKE ONE CAPSULE BY MOUTH TWICE A DAY 180 capsule 1  . Saw Palmetto, Serenoa repens, (SAW PALMETTO PO) Take 900 mg by mouth 2 (two) times daily.      No current facility-administered medications for this visit.    Allergies:   Tetracyclines & related    Social History:  The patient  reports that he quit smoking about 25 years ago. He has never used smokeless tobacco. He reports that he does not drink alcohol or use illicit drugs.   Family History:  The patient's  family history includes Atrial fibrillation in his brother; Coronary artery disease in his father; Heart attack in his father; Mitral valve prolapse in his sister; Stroke in his mother.    ROS:  Please see the history of present illness.   All other systems are reviewed and negative.    PHYSICAL EXAM: VS:  BP 114/80 mmHg  Pulse 64  Ht 6' (1.829 m)  Wt 188 lb 12.8 oz (85.639 kg)  BMI 25.60 kg/m2 , BMI Body mass index is 25.6 kg/(m^2). GEN: Well nourished, well developed, in no acute distress HEENT: normal  Neck: no JVD, carotid bruits, or masses Cardiac: RRR; no murmurs, rubs, or gallops,no edema  Respiratory:  clear to auscultation bilaterally, normal work of breathing GI: soft, nontender, nondistended, + BS MS: no deformity or atrophy Skin: warm and dry  Neuro:  Strength and sensation are intact Psych: euthymic mood, full affect  EKG:  EKG is ordered today. The ekg ordered today shows sinus rhythm 64 bpm, PR 206, septal infarct, otherwise normal ekg Recent Labs: 06/11/2014: Magnesium 2.3 10/03/2014: BUN 13; Creatinine, Ser 1.06; Hemoglobin 13.7; Platelets 232; Potassium 3.8; Sodium 139    Lipid Panel     Component Value Date/Time   CHOL  08/23/2007 0910    188        ATP III CLASSIFICATION:  <200     mg/dL   Desirable  200-239  mg/dL   Borderline High  >=240    mg/dL   High   TRIG 63 08/23/2007 0910   HDL 47 08/23/2007 0910   CHOLHDL 4.0 08/23/2007 0910   VLDL 13 08/23/2007 0910   LDLCALC * 08/23/2007 0910    128        Total Cholesterol/HDL:CHD Risk Coronary Heart Disease Risk Table                     Men   Women  1/2 Average Risk   3.4   3.3     Wt Readings from Last 3 Encounters:  12/26/14 188 lb 12.8 oz (85.639 kg)  11/05/14 196 lb (88.905 kg)   10/23/14 196 lb (88.905 kg)     ASSESSMENT AND PLAN:  1. Persistent afib Doing great s/p ablation No concerns at this time Will reduce diltiazem 180mg  daily.  In 6 weeks, reduce metoprolol to 12.5mg  BID Consider stopping tikosyn on return Continue pradaxa  2. Shortness of breath/ edema Resolved with sinus rhythm  3. OSA Compliance with CPAP is encouraged.    4. HTN Stable  5. ?CAD Cardiac CT previously abnormal, however myoview is low risk Continue medical therapy  Current medicines are reviewed at length with the patient today.   The patient does not have concerns regarding his medicines.  The following changes were made today:  none  Labs/ tests ordered today include:  Orders Placed This Encounter  Procedures  . EKG 12-Lead       Signed, Thompson Grayer, MD  12/26/2014 2:46 PM     [place] [address] [phone] (office) [phone] (fax)

## 2014-12-26 NOTE — Patient Instructions (Addendum)
Medication Instructions:  Your physician has recommended you make the following change in your medication:  1) Decrease Diltiazem to 180 mg daily 2) Decrease Metoprolol to 12.5mg  daily on 02/07/15   Labwork: None ordered  Testing/Procedures: None ordered  Follow-Up: Your physician recommends that you schedule a follow-up appointment in: 3 months with Dr Rayann Heman   Any Other Special Instructions Will Be Listed Below (If Applicable).

## 2014-12-31 ENCOUNTER — Encounter: Payer: Self-pay | Admitting: Internal Medicine

## 2014-12-31 ENCOUNTER — Telehealth: Payer: Self-pay | Admitting: *Deleted

## 2014-12-31 MED ORDER — DILTIAZEM HCL ER COATED BEADS 180 MG PO CP24: 180.0000 mg | ORAL_CAPSULE | Freq: Every day | ORAL | Status: DC

## 2014-12-31 NOTE — Telephone Encounter (Signed)
Claiborne Billings-        My pharmacy ([facility] [address]) had the new prescription for Metoprolol 25 mg, but they said they hadn't received the new prescription for Diltiazem 180 mg. Thanks.        Vassie Loll with [facility] and they said they still have no received prescription. Resent prescription.

## 2015-02-09 ENCOUNTER — Encounter: Payer: Self-pay | Admitting: Internal Medicine

## 2015-02-11 ENCOUNTER — Other Ambulatory Visit: Payer: Self-pay | Admitting: *Deleted

## 2015-02-11 MED ORDER — METOPROLOL SUCCINATE ER 25 MG PO TB24: 25.0000 mg | ORAL_TABLET | Freq: Every day | ORAL | Status: DC

## 2015-02-14 ENCOUNTER — Encounter: Payer: Self-pay | Admitting: Internal Medicine

## 2015-02-25 ENCOUNTER — Other Ambulatory Visit: Payer: Self-pay | Admitting: Physician Assistant

## 2015-02-27 ENCOUNTER — Other Ambulatory Visit: Payer: Self-pay | Admitting: Cardiovascular Disease

## 2015-02-27 NOTE — Telephone Encounter (Signed)
Patient needs refills on atorvastatin.  A High Warning is coming up due to the potential problems with the interaction.  Please review and advise.

## 2015-03-01 ENCOUNTER — Other Ambulatory Visit: Payer: Self-pay | Admitting: Cardiovascular Disease

## 2015-03-20 ENCOUNTER — Ambulatory Visit (INDEPENDENT_AMBULATORY_CARE_PROVIDER_SITE_OTHER): Payer: Medicare Other | Admitting: Internal Medicine

## 2015-03-20 ENCOUNTER — Encounter: Payer: Self-pay | Admitting: Internal Medicine

## 2015-03-20 VITALS — BP 122/78 | HR 83 | Ht 72.0 in | Wt 187.4 lb

## 2015-03-20 DIAGNOSIS — I4819 Other persistent atrial fibrillation: Secondary | ICD-10-CM

## 2015-03-20 DIAGNOSIS — I10 Essential (primary) hypertension: Secondary | ICD-10-CM

## 2015-03-20 DIAGNOSIS — I481 Persistent atrial fibrillation: Secondary | ICD-10-CM

## 2015-03-20 DIAGNOSIS — I480 Paroxysmal atrial fibrillation: Secondary | ICD-10-CM

## 2015-03-20 NOTE — Patient Instructions (Signed)
Medication Instructions:  Your physician recommends that you continue on your current medications as directed. Please refer to the Current Medication list given to you today.   Labwork: None ordered   Testing/Procedures: Your physician has requested that you have an echocardiogram. Echocardiography is a painless test that uses sound waves to create images of your heart. It provides your doctor with information about the size and shape of your heart and how well your heart's chambers and valves are working. This procedure takes approximately one hour. There are no restrictions for this procedure.     Follow-Up: Your physician wants you to follow-up in: 6 months with Dr Allred You will receive a reminder letter in the mail two months in advance. If you don't receive a letter, please call our office to schedule the follow-up appointment.     Any Other Special Instructions Will Be Listed Below (If Applicable).     If you need a refill on your cardiac medications before your next appointment, please call your pharmacy.   

## 2015-03-20 NOTE — Progress Notes (Signed)
Electrophysiology Office Note   Date:  03/20/2015   ID:  [name] [name], DOB [birth year], MRN [id number]  PCP:  [name], MD  Cardiologist:  Dr Claiborne Billings Primary Electrophysiologist: Thompson Grayer, MD    Chief Complaint  Patient presents with  . PAF  . Persistent AFIB     History of Present Illness: [name] [name] is a 69 y.o. male who presents today for electrophysiology evaluation.  He is very happy with the results of his ablation.  He denies procedure related complications.  He is pleased with his results but is still too afraid to stop tikosyn.  Today, he denies symptoms of palpitations, chest pain,  orthopnea, PND, lower extremity edema, claudication, dizziness, presyncope, syncope, bleeding, or neurologic sequela.    Past Medical History  Diagnosis Date  . Paroxysmal atrial fibrillation (HCC)     a. s/p afib ablations 2002 and 2009 by Dr Rolland Porter. b. s/p DCCV 02/2013. c. s/p DCCV 01/19/2014  . H/O cardiac catheterization     no significant CAD by cath 5/12  . Cardiomyopathy ([employer])     a. 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 b. also hx of tachycardia mediated CM, now resolved  . Hypertension   . Hyperlipidemia   . Thoracic aortic aneurysm ([place])     a. Borderline enlarged by CT 12/14 at 4.0cm.  . Depression   . OSA (obstructive sleep apnea)     AHI avg 5/hr  . Limb pain 06/20/2007    LE venous doppler - no evidence of thrombus or thrombophlebitis  . PVC (premature ventricular contraction) 12/18/2009    cardionet monitor 21 days - some PVCs, ventricular bigeminy  . Pericardial effusion     a. Since 2007. b. s/p pericardial window 04/2013.  Marland Kitchen Echocardiogram abnormal     EF60%, mild LVH, PA pk pressure 35, pericardial effusion mild increase from 2012   . Complication of anesthesia     Pt has anxiety issues and thinks he is going to die.  . Liver mass     a. CT 03/2013: Small probable cysts in the liver and small hyperenhancing liver mass.   Past  Surgical History  Procedure Laterality Date  . Cardiac catheterization  08/25/10    NO SIGNIFICANT CAD  . Cardioversion  10/18/2007    successful  . Elbow / upper arm foreign body removal    . Transesophageal echocardiogram  08/26/2010    see Notes tab  . Cardiovascular stress test  09/08/2005    R/S MV - EF 61%; Exercises capacity 12 Mets; essentially normal perfusion myocardial scan    . Cardiac electrophysiology mapping and ablation  [id number]    Dr. Tally Due  . Osteosynthesis Left 09/30/2012    with small plate in the ulna  . Cardioversion N/A 02/08/2013    Procedure: CARDIOVERSION;  Surgeon: Troy Sine, MD;  Location: [facility] ENDOSCOPY;  Service: Cardiovascular;  Laterality: N/A;  . Fracture surgery Left     Left wrist  . Subxyphoid pericardial window N/A 04/25/2013    Procedure: SUBXYPHOID PERICARDIAL WINDOW;  Surgeon: Gaye Pollack, MD;  Location: [place] OR;  Service: Thoracic;  Laterality: N/A;  . Carpal tunnel release Left April 2015  . Cardioversion N/A 08/17/2013    Procedure: CARDIOVERSION;  Surgeon: Sanda Klein, MD;  Location: [place];  Service: Cardiovascular;  Laterality: N/A;  . Cardioversion N/A 12/28/2013    Procedure: CARDIOVERSION;  Surgeon: Fay Records, MD;  Location: [place];  Service: Cardiovascular;  Laterality: N/A;  . Cardioversion N/A 01/19/2014    Procedure: CARDIOVERSION;  Surgeon: Sanda Klein, MD;  Location: [place] ENDOSCOPY;  Service: Cardiovascular;  Laterality: N/A;  . Cardioversion N/A 03/11/2014    Procedure: CARDIOVERSION;  Surgeon: Evans Lance, MD;  Location: [place];  Service: Cardiovascular;  Laterality: N/A;  . Left and right heart catheterization with coronary angiogram N/A 03/17/2013    Procedure: LEFT AND RIGHT HEART CATHETERIZATION WITH CORONARY ANGIOGRAM;  Surgeon: Troy Sine, MD;  Location: [facility] CATH LAB;  Service: Cardiovascular;  Laterality: N/A;  . Cardioversion N/A 08/23/2014    Procedure: CARDIOVERSION;  Surgeon: Lelon Perla, MD;  Location: [facility] ENDOSCOPY;  Service: Cardiovascular;  Laterality: N/A;  . Tee without cardioversion N/A 09/24/2014    Procedure: TRANSESOPHAGEAL ECHOCARDIOGRAM (TEE);  Surgeon: Dorothy Spark, MD;  Location: [facility] ENDOSCOPY;  Service: Cardiovascular;  Laterality: N/A;  . Electrophysiologic study N/A 09/25/2014    Atrial fibrillation ablation performed by Dr Rayann Heman  . Cardioversion N/A 10/04/2014    Procedure: CARDIOVERSION;  Surgeon: Sueanne Margarita, MD;  Location: [place] ENDOSCOPY;  Service: Cardiovascular;  Laterality: N/A;     Current Outpatient Prescriptions  Medication Sig Dispense Refill  . atorvastatin (LIPITOR) 40 MG tablet TAKE 1 TABLET BY MOUTH EVERY DAY 90 tablet 1  . CALCIUM-MAGNESIUM-ZINC PO Take 1 capsule by mouth daily.    . Cholecalciferol (VITAMIN D) 2000 UNITS CAPS Take 2,000 Units by mouth daily.    Marland Kitchen diltiazem (CARDIZEM CD) 180 MG 24 hr capsule Take 1 capsule (180 mg total) by mouth daily. 90 capsule 3  . finasteride (PROPECIA) 1 MG tablet Take 1 mg by mouth every morning.     . furosemide (LASIX) 40 MG tablet Take 1 tablet (40 mg total) by mouth 2 (two) times daily. 90 tablet 3  . metoprolol succinate (TOPROL-XL) 25 MG 24 hr tablet Take 1 tablet (25 mg total) by mouth daily. Take with or immediately following a meal. 90 tablet 3  . pantoprazole (PROTONIX) 40 MG tablet Take 1 tablet (40 mg total) by mouth daily. Patient needs to contact office for and appointment 90 tablet 3  . potassium chloride SA (K-DUR,KLOR-CON) 20 MEQ tablet Take 1.5 tablets (30 mEq total) by mouth daily. 45 tablet 9  . PRADAXA 150 MG CAPS capsule TAKE ONE CAPSULE BY MOUTH TWICE A DAY 180 capsule 1  . Saw Palmetto, Serenoa repens, (SAW PALMETTO PO) Take 900 mg by mouth 2 (two) times daily.     Marland Kitchen TIKOSYN 500 MCG capsule TAKE ONE CAPSULE BY MOUTH TWICE DAILY 180 capsule 2   No current facility-administered medications for this visit.    Allergies:   Tetracyclines & related   Social History:   The patient  reports that he quit smoking about 26 years ago. He has never used smokeless tobacco. He reports that he does not drink alcohol or use illicit drugs.   Family History:  The patient's  family history includes Atrial fibrillation in his brother; Coronary artery disease in his father; Heart attack in his father; Mitral valve prolapse in his sister; Stroke in his mother.    ROS:  Please see the history of present illness.   All other systems are reviewed and negative.    PHYSICAL EXAM: VS:  BP 122/78 mmHg  Pulse 83  Ht 6' (1.829 m)  Wt 187 lb 6.4 oz (85.004 kg)  BMI 25.41 kg/m2 , BMI Body mass index is 25.41 kg/(m^2). GEN: Well nourished, well  developed, in no acute distress HEENT: normal Neck: no JVD, carotid bruits, or masses Cardiac: RRR; no murmurs, rubs, or gallops,no edema  Respiratory:  clear to auscultation bilaterally, normal work of breathing GI: soft, nontender, nondistended, + BS MS: no deformity or atrophy Skin: warm and dry  Neuro:  Strength and sensation are intact Psych: euthymic mood, full affect  EKG:  EKG is ordered today. The ekg ordered today shows sinus rhythm 83 bpm, PR 194 msec, septal infarct, PVCs  Recent Labs: 06/11/2014: Magnesium 2.3 10/03/2014: BUN 13; Creatinine, Ser 1.06; Hemoglobin 13.7; Platelets 232; Potassium 3.8; Sodium 139    Lipid Panel     Component Value Date/Time   CHOL  08/23/2007 0910    188        ATP III CLASSIFICATION:  <200     mg/dL   Desirable  200-239  mg/dL   Borderline High  >=240    mg/dL   High   TRIG 63 08/23/2007 0910   HDL 47 08/23/2007 0910   CHOLHDL 4.0 08/23/2007 0910   VLDL 13 08/23/2007 0910   LDLCALC * 08/23/2007 0910    128        Total Cholesterol/HDL:CHD Risk Coronary Heart Disease Risk Table                     Men   Women  1/2 Average Risk   3.4   3.3     Wt Readings from Last 3 Encounters:  03/20/15 187 lb 6.4 oz (85.004 kg)  12/26/14 188 lb 12.8 oz (85.639 kg)  11/05/14 196 lb  (88.905 kg)     ASSESSMENT AND PLAN:  1. Persistent afib Doing great s/p ablation No concerns at this time but is afraid to stop medicines I have encouraged to try stopping diltiazem He may be more willing to try stopping tikosyn this summer. Continue pradaxa He says that either xarelto or eliquis would be a lower tier for his insurance.  I have assured him that if he wishes to change to either medicine that I would be supportive of the change.  He will contact our office at that time.  2. OSA Compliance with CPAP is encouraged.    3. HTN Stable Repeat echo  4. ?CAD Cardiac CT previously abnormal, however myoview is low risk Continue medical therapy  Current medicines are reviewed at length with the patient today.   The patient does not have concerns regarding his medicines.  The following changes were made today:  none  Labs/ tests ordered today include:  Orders Placed This Encounter  Procedures  . EKG 12-Lead  . ECHOCARDIOGRAM COMPLETE       Signed, Thompson Grayer, MD  03/20/2015 7:46 PM     [address] [place] [address] [phone] (office) [phone] (fax)

## 2015-03-21 ENCOUNTER — Encounter: Payer: Self-pay | Admitting: Internal Medicine

## 2015-03-26 ENCOUNTER — Other Ambulatory Visit: Payer: Self-pay | Admitting: Internal Medicine

## 2015-03-26 ENCOUNTER — Other Ambulatory Visit: Payer: Self-pay | Admitting: Gastroenterology

## 2015-03-30 ENCOUNTER — Encounter: Payer: Self-pay | Admitting: Internal Medicine

## 2015-04-04 ENCOUNTER — Ambulatory Visit (HOSPITAL_COMMUNITY): Payer: Medicare Other | Attending: Cardiovascular Disease

## 2015-04-04 ENCOUNTER — Other Ambulatory Visit: Payer: Self-pay

## 2015-04-04 DIAGNOSIS — I517 Cardiomegaly: Secondary | ICD-10-CM | POA: Insufficient documentation

## 2015-04-04 DIAGNOSIS — E785 Hyperlipidemia, unspecified: Secondary | ICD-10-CM | POA: Insufficient documentation

## 2015-04-04 DIAGNOSIS — I480 Paroxysmal atrial fibrillation: Secondary | ICD-10-CM | POA: Diagnosis not present

## 2015-04-04 DIAGNOSIS — I4891 Unspecified atrial fibrillation: Secondary | ICD-10-CM | POA: Diagnosis present

## 2015-04-04 DIAGNOSIS — I351 Nonrheumatic aortic (valve) insufficiency: Secondary | ICD-10-CM | POA: Insufficient documentation

## 2015-05-20 ENCOUNTER — Other Ambulatory Visit (HOSPITAL_COMMUNITY): Payer: Self-pay | Admitting: *Deleted

## 2015-05-21 ENCOUNTER — Ambulatory Visit (HOSPITAL_COMMUNITY)
Admission: RE | Admit: 2015-05-21 | Discharge: 2015-05-21 | Disposition: A | Discharge status: Home / Self Care | Payer: Medicare Other | Source: Ambulatory Visit | Attending: Nurse Practitioner | Admitting: Nurse Practitioner

## 2015-05-21 ENCOUNTER — Other Ambulatory Visit (HOSPITAL_COMMUNITY): Payer: Self-pay | Admitting: *Deleted

## 2015-05-21 ENCOUNTER — Encounter (HOSPITAL_COMMUNITY): Payer: Self-pay | Admitting: Nurse Practitioner

## 2015-05-21 VITALS — BP 116/70 | HR 110 | Ht 74.0 in | Wt 189.8 lb

## 2015-05-21 DIAGNOSIS — I4819 Other persistent atrial fibrillation: Secondary | ICD-10-CM

## 2015-05-21 DIAGNOSIS — I10 Essential (primary) hypertension: Secondary | ICD-10-CM | POA: Diagnosis not present

## 2015-05-21 DIAGNOSIS — E785 Hyperlipidemia, unspecified: Secondary | ICD-10-CM | POA: Diagnosis not present

## 2015-05-21 DIAGNOSIS — I481 Persistent atrial fibrillation: Secondary | ICD-10-CM | POA: Diagnosis not present

## 2015-05-21 DIAGNOSIS — Z87891 Personal history of nicotine dependence: Secondary | ICD-10-CM | POA: Diagnosis not present

## 2015-05-21 DIAGNOSIS — Z8249 Family history of ischemic heart disease and other diseases of the circulatory system: Secondary | ICD-10-CM | POA: Diagnosis not present

## 2015-05-21 DIAGNOSIS — Z79899 Other long term (current) drug therapy: Secondary | ICD-10-CM | POA: Insufficient documentation

## 2015-05-21 DIAGNOSIS — Z7902 Long term (current) use of antithrombotics/antiplatelets: Secondary | ICD-10-CM | POA: Diagnosis not present

## 2015-05-21 DIAGNOSIS — I4891 Unspecified atrial fibrillation: Secondary | ICD-10-CM | POA: Diagnosis present

## 2015-05-21 DIAGNOSIS — Z823 Family history of stroke: Secondary | ICD-10-CM | POA: Insufficient documentation

## 2015-05-21 DIAGNOSIS — G4733 Obstructive sleep apnea (adult) (pediatric): Secondary | ICD-10-CM | POA: Diagnosis not present

## 2015-05-21 LAB — CBC
HCT: 39.5 % (ref 39.0–52.0)
Hemoglobin: 12.7 g/dL — ABNORMAL LOW (ref 13.0–17.0)
MCH: 28.4 pg (ref 26.0–34.0)
MCHC: 32.2 g/dL (ref 30.0–36.0)
MCV: 88.4 fL (ref 78.0–100.0)
Platelets: 204 10*3/uL (ref 150–400)
RBC: 4.47 MIL/uL (ref 4.22–5.81)
RDW: 13.4 % (ref 11.5–15.5)
WBC: 5.5 10*3/uL (ref 4.0–10.5)

## 2015-05-21 LAB — MAGNESIUM: MAGNESIUM: 2.1 mg/dL (ref 1.7–2.4)

## 2015-05-21 LAB — BASIC METABOLIC PANEL
Anion gap: 9 (ref 5–15)
BUN: 14 mg/dL (ref 6–20)
CALCIUM: 9.7 mg/dL (ref 8.9–10.3)
CHLORIDE: 103 mmol/L (ref 101–111)
CO2: 30 mmol/L (ref 22–32)
Creatinine, Ser: 1.02 mg/dL (ref 0.61–1.24)
GFR calc Af Amer: >60 mL/min (ref >60)
GLUCOSE: 96 mg/dL (ref 65–99)
POTASSIUM: 5.4 mmol/L — AB (ref 3.5–5.1)
SODIUM: 142 mmol/L (ref 135–145)

## 2015-05-21 MED ORDER — FUROSEMIDE 40 MG PO TABS: 40.0000 mg | ORAL_TABLET | Freq: Every day | ORAL | Status: DC

## 2015-05-21 NOTE — Progress Notes (Signed)
Patient ID: [name] [name], male   DOB: [birth year], 70 y.o.   MRN: [id number]     Primary Care Physician: [name], MD Referring Physician: Dr. Macon Large is a 70 y.o. male with a h/o persistent afib who has his 3rd ablation 6/21. He left the hospital in SR but ERAF. Had successful cardioversion. Says he has had over 48 cardioversion's in his 27 years living with afib. Denied any swallowing/ rt groin issues from PVI.   He recently saw Dr. Rayann Heman 12/14 and was doing well. Had over 6 months without afib. Much better energy and was being more active/exercising and had been able to lose weight. He was in his USOH Sunday pm, when he was very aware of afib. It has been persistent since then despite increasing metoprolol. He called yesterday requesting a cardioversion as he will not return to rhythm without it. He has been compliant with tikosyn and pradaxa, states no missed doses.  Today, he denies symptoms of palpitations, chest pain, shortness of breath, orthopnea, PND, lower extremity edema, dizziness, presyncope, syncope, or neurologic sequela. The patient is tolerating medications without difficulties and is otherwise without complaint today.   Past Medical History  Diagnosis Date  . Paroxysmal atrial fibrillation (HCC)     a. s/p afib ablations 2002 and 2009 by Dr Rolland Porter. b. s/p DCCV 02/2013. c. s/p DCCV 01/19/2014  . H/O cardiac catheterization     no significant CAD by cath 5/12  . Cardiomyopathy ([place])     a. 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 b. also hx of tachycardia mediated CM, now resolved  . Hypertension   . Hyperlipidemia   . Thoracic aortic aneurysm ([place])     a. Borderline enlarged by CT 12/14 at 4.0cm.  . Depression   . OSA (obstructive sleep apnea)     AHI avg 5/hr  . Limb pain 06/20/2007    LE venous doppler - no evidence of thrombus or thrombophlebitis  . PVC (premature ventricular contraction) 12/18/2009    cardionet monitor 21  days - some PVCs, ventricular bigeminy  . Pericardial effusion     a. Since 2007. b. s/p pericardial window 04/2013.  Marland Kitchen Echocardiogram abnormal     EF60%, mild LVH, PA pk pressure 35, pericardial effusion mild increase from 2012   . Complication of anesthesia     Pt has anxiety issues and thinks he is going to die.  . Liver mass     a. CT 03/2013: Small probable cysts in the liver and small hyperenhancing liver mass.   Past Surgical History  Procedure Laterality Date  . Cardiac catheterization  08/25/10    NO SIGNIFICANT CAD  . Cardioversion  10/18/2007    successful  . Elbow / upper arm foreign body removal    . Transesophageal echocardiogram  08/26/2010    see Notes tab  . Cardiovascular stress test  09/08/2005    R/S MV - EF 61%; Exercises capacity 12 Mets; essentially normal perfusion myocardial scan    . Cardiac electrophysiology mapping and ablation  [id number]    Dr. Tally Due  . Osteosynthesis Left 09/30/2012    with small plate in the ulna  . Cardioversion N/A 02/08/2013    Procedure: CARDIOVERSION;  Surgeon: Troy Sine, MD;  Location: [facility] ENDOSCOPY;  Service: Cardiovascular;  Laterality: N/A;  . Fracture surgery Left     Left wrist  . Subxyphoid pericardial window N/A 04/25/2013    Procedure:  SUBXYPHOID PERICARDIAL WINDOW;  Surgeon: Gaye Pollack, MD;  Location: [facility] OR;  Service: Thoracic;  Laterality: N/A;  . Carpal tunnel release Left April 2015  . Cardioversion N/A 08/17/2013    Procedure: CARDIOVERSION;  Surgeon: Sanda Klein, MD;  Location: [place];  Service: Cardiovascular;  Laterality: N/A;  . Cardioversion N/A 12/28/2013    Procedure: CARDIOVERSION;  Surgeon: Fay Records, MD;  Location: [place];  Service: Cardiovascular;  Laterality: N/A;  . Cardioversion N/A 01/19/2014    Procedure: CARDIOVERSION;  Surgeon: Sanda Klein, MD;  Location: [facility] ENDOSCOPY;  Service: Cardiovascular;  Laterality: N/A;  . Cardioversion N/A 03/11/2014    Procedure:  CARDIOVERSION;  Surgeon: Evans Lance, MD;  Location: [place];  Service: Cardiovascular;  Laterality: N/A;  . Left and right heart catheterization with coronary angiogram N/A 03/17/2013    Procedure: LEFT AND RIGHT HEART CATHETERIZATION WITH CORONARY ANGIOGRAM;  Surgeon: Troy Sine, MD;  Location: [facility] CATH LAB;  Service: Cardiovascular;  Laterality: N/A;  . Cardioversion N/A 08/23/2014    Procedure: CARDIOVERSION;  Surgeon: Lelon Perla, MD;  Location: [facility] ENDOSCOPY;  Service: Cardiovascular;  Laterality: N/A;  . Tee without cardioversion N/A 09/24/2014    Procedure: TRANSESOPHAGEAL ECHOCARDIOGRAM (TEE);  Surgeon: Dorothy Spark, MD;  Location: [facility] ENDOSCOPY;  Service: Cardiovascular;  Laterality: N/A;  . Electrophysiologic study N/A 09/25/2014    Atrial fibrillation ablation performed by Dr Rayann Heman  . Cardioversion N/A 10/04/2014    Procedure: CARDIOVERSION;  Surgeon: Sueanne Margarita, MD;  Location: [place] ENDOSCOPY;  Service: Cardiovascular;  Laterality: N/A;    Current Outpatient Prescriptions  Medication Sig Dispense Refill  . atorvastatin (LIPITOR) 40 MG tablet TAKE 1 TABLET BY MOUTH EVERY DAY (Patient taking differently: TAKE 1 TABLET BY MOUTH EVERY evening) 90 tablet 1  . Cholecalciferol (VITAMIN D) 2000 UNITS CAPS Take 2,000 Units by mouth daily.    Marland Kitchen diltiazem (CARDIZEM CD) 180 MG 24 hr capsule Take 1 capsule (180 mg total) by mouth daily. (Patient taking differently: Take 180 mg by mouth every evening. ) 90 capsule 3  . finasteride (PROPECIA) 1 MG tablet Take 1 mg by mouth every morning.     . furosemide (LASIX) 40 MG tablet Take 1 tablet (40 mg total) by mouth 2 (two) times daily. (Patient taking differently: Take 40 mg by mouth every morning. ) 90 tablet 3  . metoprolol succinate (TOPROL-XL) 25 MG 24 hr tablet Take 1 tablet (25 mg total) by mouth daily. Take with or immediately following a meal. 90 tablet 3  . pantoprazole (PROTONIX) 40 MG tablet Take 1 tablet (40 mg total) by mouth  daily. Patient needs to contact office for and appointment (Patient taking differently: Take 40 mg by mouth every evening. Patient needs to contact office for and appointment) 90 tablet 3  . potassium chloride SA (K-DUR,KLOR-CON) 20 MEQ tablet Take 1.5 tablets (30 mEq total) by mouth daily. 45 tablet 9  . PRADAXA 150 MG CAPS capsule TAKE ONE CAPSULE BY MOUTH TWICE A DAY 180 capsule 3  . Saw Palmetto, Serenoa repens, (SAW PALMETTO PO) Take 900 mg by mouth 2 (two) times daily.     . silver sulfADIAZINE (SILVADENE) 1 % cream Apply 1 application topically 2 (two) times daily.  0  . TIKOSYN 500 MCG capsule TAKE ONE CAPSULE BY MOUTH TWICE DAILY 180 capsule 2   No current facility-administered medications for this encounter.    Allergies  Allergen Reactions  . Tetracyclines & Related Hives  rash    Social History   Social History  . Marital Status: Single    Spouse Name: N/A  . Number of Children: 0  . Years of Education: N/A   Occupational History  .  [place] History Main Topics  . Smoking status: Former Smoker -- 1.00 packs/day for 10 years    Quit date: 02/06/1989  . Smokeless tobacco: Never Used  . Alcohol Use: No     Comment: 2 bottles of wine per week, pt states has not drank alcohol since christmas  . Drug Use: No  . Sexual Activity: Not on file   Other Topics Concern  . 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    Family History  Problem Relation Age of Onset  . Coronary artery disease Father   . Stroke Mother     died  . Atrial fibrillation Brother   . Heart attack Father   . Mitral valve prolapse Sister     ROS- All systems are reviewed and negative except as per the HPI above  Physical Exam: Filed Vitals:   05/21/15 1128  BP: 116/70  Pulse:  110  Height: 6\' 2"  (1.88 m)  Weight: 189 lb 12.8 oz (86.093 kg)    GEN- The patient is well appearing, alert and oriented x 3 today.   Head- normocephalic, atraumatic Eyes-  Sclera clear, conjunctiva pink Ears- hearing intact Oropharynx- clear Neck- supple, no JVP Lymph- no cervical lymphadenopathy Lungs- Clear to ausculation bilaterally, normal work of breathing Heart- Rapid, irregular rate and rhythm, no murmurs, rubs or gallops, PMI not laterally displaced GI- soft, NT, ND, + BS Extremities- no clubbing, cyanosis, or edema MS- no significant deformity or atrophy Skin- no rash or lesion Psych- euthymic mood, full affect Neuro- strength and sensation are intact  EKG-afib with rvr at 110 bpm, qrs int 88 ms, qtc 487 ms Epic records reviewed.   Assessment and Plan: 1.Persisitent symptomatic Afib s/p PVI 6/21 No missed doses of pradaxa Continue dofetilide, diltiazem/metoprolol Scheduled for cardioversion 2/15 Bmet, cbc, mag  F/u in one week s/p cardioversion  Butch Penny C. Carroll, [facility] [address] [place], [place] [address] [phone]

## 2015-05-21 NOTE — Patient Instructions (Addendum)
Cardioversion scheduled for Wednesday, February 15th  - Arrive at the [facility] and go to admitting at [facility] not eat or drink anything after midnight the night prior to your procedure.  - Take all your medication with a sip of water prior to arrival.  - You will not be able to drive home after your procedure.

## 2015-05-22 ENCOUNTER — Ambulatory Visit (HOSPITAL_COMMUNITY): Payer: Medicare Other | Admitting: Anesthesiology

## 2015-05-22 ENCOUNTER — Encounter (HOSPITAL_COMMUNITY): Payer: Self-pay | Admitting: *Deleted

## 2015-05-22 ENCOUNTER — Encounter (HOSPITAL_COMMUNITY)
Admission: RE | Disposition: A | Discharge status: Home / Self Care | Payer: Self-pay | Source: Ambulatory Visit | Attending: Cardiovascular Disease

## 2015-05-22 ENCOUNTER — Other Ambulatory Visit (HOSPITAL_COMMUNITY): Payer: Self-pay | Admitting: *Deleted

## 2015-05-22 ENCOUNTER — Ambulatory Visit (HOSPITAL_COMMUNITY)
Admission: RE | Admit: 2015-05-22 | Discharge: 2015-05-22 | Disposition: A | Discharge status: Home / Self Care | Payer: Medicare Other | Source: Ambulatory Visit | Attending: Cardiovascular Disease | Admitting: Cardiovascular Disease

## 2015-05-22 DIAGNOSIS — Z7901 Long term (current) use of anticoagulants: Secondary | ICD-10-CM | POA: Insufficient documentation

## 2015-05-22 DIAGNOSIS — I4891 Unspecified atrial fibrillation: Secondary | ICD-10-CM | POA: Diagnosis not present

## 2015-05-22 DIAGNOSIS — I10 Essential (primary) hypertension: Secondary | ICD-10-CM | POA: Insufficient documentation

## 2015-05-22 DIAGNOSIS — I481 Persistent atrial fibrillation: Secondary | ICD-10-CM | POA: Diagnosis not present

## 2015-05-22 DIAGNOSIS — G4733 Obstructive sleep apnea (adult) (pediatric): Secondary | ICD-10-CM | POA: Diagnosis not present

## 2015-05-22 DIAGNOSIS — I429 Cardiomyopathy, unspecified: Secondary | ICD-10-CM | POA: Diagnosis not present

## 2015-05-22 DIAGNOSIS — Z87891 Personal history of nicotine dependence: Secondary | ICD-10-CM | POA: Insufficient documentation

## 2015-05-22 DIAGNOSIS — E785 Hyperlipidemia, unspecified: Secondary | ICD-10-CM | POA: Insufficient documentation

## 2015-05-22 HISTORY — PX: CARDIOVERSION: SHX1299

## 2015-05-22 LAB — POCT I-STAT 4, (NA,K, GLUC, HGB,HCT)
Glucose, Bld: 82 mg/dL (ref 65–99)
HCT: 45 % (ref 39.0–52.0)
Hemoglobin: 15.3 g/dL (ref 13.0–17.0)
POTASSIUM: 3.6 mmol/L (ref 3.5–5.1)
SODIUM: 141 mmol/L (ref 135–145)

## 2015-05-22 SURGERY — CARDIOVERSION
Anesthesia: Monitor Anesthesia Care

## 2015-05-22 MED ORDER — SODIUM CHLORIDE 0.9 % IV SOLN
INTRAVENOUS | Status: DC | PRN
  Administered 2015-05-22: 13:00:00 via INTRAVENOUS

## 2015-05-22 MED ORDER — PROPOFOL 10 MG/ML IV BOLUS
INTRAVENOUS | Status: DC | PRN
  Administered 2015-05-22: 30 mg via INTRAVENOUS
  Administered 2015-05-22: 50 mg via INTRAVENOUS

## 2015-05-22 MED ORDER — LIDOCAINE HCL (CARDIAC) 20 MG/ML IV SOLN
INTRAVENOUS | Status: DC | PRN
  Administered 2015-05-22: 3 mL via INTRATRACHEAL

## 2015-05-22 MED ORDER — SODIUM CHLORIDE 0.9 % IV SOLN
INTRAVENOUS | Status: DC
  Administered 2015-05-22: 500 mL via INTRAVENOUS

## 2015-05-22 NOTE — H&P (View-Only) (Signed)
Patient ID: [name] [name], male   DOB: Jun 15, 1945, 70 y.o.   MRN: [id number]     Primary Care Physician: [name], MD Referring Physician: Dr. Macon Large is a 70 y.o. male with a h/o persistent afib who has his 3rd ablation 6/21. He left the hospital in SR but ERAF. Had successful cardioversion. Says he has had over 34 cardioversion's in his 27 years living with afib. Denied any swallowing/ rt groin issues from PVI.   He recently saw Dr. Rayann Heman 12/14 and was doing well. Had over 6 months without afib. Much better energy and was being more active/exercising and had been able to lose weight. He was in his USOH Sunday pm, when he was very aware of afib. It has been persistent since then despite increasing metoprolol. He called yesterday requesting a cardioversion as he will not return to rhythm without it. He has been compliant with tikosyn and pradaxa, states no missed doses.  Today, he denies symptoms of palpitations, chest pain, shortness of breath, orthopnea, PND, lower extremity edema, dizziness, presyncope, syncope, or neurologic sequela. The patient is tolerating medications without difficulties and is otherwise without complaint today.   Past Medical History  Diagnosis Date  . Paroxysmal atrial fibrillation (HCC)     a. s/p afib ablations 2002 and 2009 by Dr Rolland Porter. b. s/p DCCV 02/2013. c. s/p DCCV 01/19/2014  . H/O cardiac catheterization     no significant CAD by cath 5/12  . Cardiomyopathy ([place])     a. 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 b. also hx of tachycardia mediated CM, now resolved  . Hypertension   . Hyperlipidemia   . Thoracic aortic aneurysm ([place])     a. Borderline enlarged by CT 12/14 at 4.0cm.  . Depression   . OSA (obstructive sleep apnea)     AHI avg 5/hr  . Limb pain 06/20/2007    LE venous doppler - no evidence of thrombus or thrombophlebitis  . PVC (premature ventricular contraction) 12/18/2009    cardionet monitor 21  days - some PVCs, ventricular bigeminy  . Pericardial effusion     a. Since 2007. b. s/p pericardial window 04/2013.  Marland Kitchen Echocardiogram abnormal     EF60%, mild LVH, PA pk pressure 35, pericardial effusion mild increase from 2012   . Complication of anesthesia     Pt has anxiety issues and thinks he is going to die.  . Liver mass     a. CT 03/2013: Small probable cysts in the liver and small hyperenhancing liver mass.   Past Surgical History  Procedure Laterality Date  . Cardiac catheterization  08/25/10    NO SIGNIFICANT CAD  . Cardioversion  10/18/2007    successful  . Elbow / upper arm foreign body removal    . Transesophageal echocardiogram  08/26/2010    see Notes tab  . Cardiovascular stress test  09/08/2005    R/S MV - EF 61%; Exercises capacity 12 Mets; essentially normal perfusion myocardial scan    . Cardiac electrophysiology mapping and ablation  [id number]    Dr. Tally Due  . Osteosynthesis Left 09/30/2012    with small plate in the ulna  . Cardioversion N/A 02/08/2013    Procedure: CARDIOVERSION;  Surgeon: Troy Sine, MD;  Location[facility] ENDOSCOPY;  Service: Cardiovascular;  Laterality: N/A;  . Fracture surgery Left     Left wrist  . Subxyphoid pericardial window N/A 04/25/2013    Procedure:  SUBXYPHOID PERICARDIAL WINDOW;  Surgeon: Gaye Pollack, MD;  Location: [facility] OR;  Service: Thoracic;  Laterality: N/A;  . Carpal tunnel release Left April 2015  . Cardioversion N/A 08/17/2013    Procedure: CARDIOVERSION;  Surgeon: Sanda Klein, MD;  Location: [place];  Service: Cardiovascular;  Laterality: N/A;  . Cardioversion N/A 12/28/2013    Procedure: CARDIOVERSION;  Surgeon: Fay Records, MD;  Location: [place];  Service: Cardiovascular;  Laterality: N/A;  . Cardioversion N/A 01/19/2014    Procedure: CARDIOVERSION;  Surgeon: Sanda Klein, MD;  Location[facility] ENDOSCOPY;  Service: Cardiovascular;  Laterality: N/A;  . Cardioversion N/A 03/11/2014    Procedure:  CARDIOVERSION;  Surgeon: Evans Lance, MD;  Location: [place];  Service: Cardiovascular;  Laterality: N/A;  . Left and right heart catheterization with coronary angiogram N/A 03/17/2013    Procedure: LEFT AND RIGHT HEART CATHETERIZATION WITH CORONARY ANGIOGRAM;  Surgeon: Troy Sine, MD;  Location: [facility] CATH LAB;  Service: Cardiovascular;  Laterality: N/A;  . Cardioversion N/A 08/23/2014    Procedure: CARDIOVERSION;  Surgeon: Lelon Perla, MD;  Location: [facility] ENDOSCOPY;  Service: Cardiovascular;  Laterality: N/A;  . Tee without cardioversion N/A 09/24/2014    Procedure: TRANSESOPHAGEAL ECHOCARDIOGRAM (TEE);  Surgeon: Dorothy Spark, MD;  Location: [facility] ENDOSCOPY;  Service: Cardiovascular;  Laterality: N/A;  . Electrophysiologic study N/A 09/25/2014    Atrial fibrillation ablation performed by Dr Rayann Heman  . Cardioversion N/A 10/04/2014    Procedure: CARDIOVERSION;  Surgeon: Sueanne Margarita, MD;  Location: [place] ENDOSCOPY;  Service: Cardiovascular;  Laterality: N/A;    Current Outpatient Prescriptions  Medication Sig Dispense Refill  . atorvastatin (LIPITOR) 40 MG tablet TAKE 1 TABLET BY MOUTH EVERY DAY (Patient taking differently: TAKE 1 TABLET BY MOUTH EVERY evening) 90 tablet 1  . Cholecalciferol (VITAMIN D) 2000 UNITS CAPS Take 2,000 Units by mouth daily.    Marland Kitchen diltiazem (CARDIZEM CD) 180 MG 24 hr capsule Take 1 capsule (180 mg total) by mouth daily. (Patient taking differently: Take 180 mg by mouth every evening. ) 90 capsule 3  . finasteride (PROPECIA) 1 MG tablet Take 1 mg by mouth every morning.     . furosemide (LASIX) 40 MG tablet Take 1 tablet (40 mg total) by mouth 2 (two) times daily. (Patient taking differently: Take 40 mg by mouth every morning. ) 90 tablet 3  . metoprolol succinate (TOPROL-XL) 25 MG 24 hr tablet Take 1 tablet (25 mg total) by mouth daily. Take with or immediately following a meal. 90 tablet 3  . pantoprazole (PROTONIX) 40 MG tablet Take 1 tablet (40 mg total) by mouth  daily. Patient needs to contact office for and appointment (Patient taking differently: Take 40 mg by mouth every evening. Patient needs to contact office for and appointment) 90 tablet 3  . potassium chloride SA (K-DUR,KLOR-CON) 20 MEQ tablet Take 1.5 tablets (30 mEq total) by mouth daily. 45 tablet 9  . PRADAXA 150 MG CAPS capsule TAKE ONE CAPSULE BY MOUTH TWICE A DAY 180 capsule 3  . Saw Palmetto, Serenoa repens, (SAW PALMETTO PO) Take 900 mg by mouth 2 (two) times daily.     . silver sulfADIAZINE (SILVADENE) 1 % cream Apply 1 application topically 2 (two) times daily.  0  . TIKOSYN 500 MCG capsule TAKE ONE CAPSULE BY MOUTH TWICE DAILY 180 capsule 2   No current facility-administered medications for this encounter.    Allergies  Allergen Reactions  . Tetracyclines & Related Hives  rash    Social History   Social History  . Marital Status: Single    Spouse Name: N/A  . Number of Children: 0  . Years of Education: N/A   Occupational History  .  [place] History Main Topics  . Smoking status: Former Smoker -- 1.00 packs/day for 10 years    Quit date: 02/06/1989  . Smokeless tobacco: Never Used  . Alcohol Use: No     Comment: 2 bottles of wine per week, pt states has not drank alcohol since christmas  . Drug Use: No  . Sexual Activity: Not on file   Other Topics Concern  . 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    Family History  Problem Relation Age of Onset  . Coronary artery disease Father   . Stroke Mother     died  . Atrial fibrillation Brother   . Heart attack Father   . Mitral valve prolapse Sister     ROS- All systems are reviewed and negative except as per the HPI above  Physical Exam: Filed Vitals:   05/21/15 1128  BP: 116/70  Pulse:  110  Height: 6\' 2"  (1.88 m)  Weight: 189 lb 12.8 oz (86.093 kg)    GEN- The patient is well appearing, alert and oriented x 3 today.   Head- normocephalic, atraumatic Eyes-  Sclera clear, conjunctiva pink Ears- hearing intact Oropharynx- clear Neck- supple, no JVP Lymph- no cervical lymphadenopathy Lungs- Clear to ausculation bilaterally, normal work of breathing Heart- Rapid, irregular rate and rhythm, no murmurs, rubs or gallops, PMI not laterally displaced GI- soft, NT, ND, + BS Extremities- no clubbing, cyanosis, or edema MS- no significant deformity or atrophy Skin- no rash or lesion Psych- euthymic mood, full affect Neuro- strength and sensation are intact  EKG-afib with rvr at 110 bpm, qrs int 88 ms, qtc 487 ms Epic records reviewed.   Assessment and Plan: 1.Persisitent symptomatic Afib s/p PVI 6/21 No missed doses of pradaxa Continue dofetilide, diltiazem/metoprolol Scheduled for cardioversion 2/15 Bmet, cbc, mag  F/u in one week s/p cardioversion  Butch Penny C. Susi Goslin, [facility] [address] [place], [place] [address] [phone]

## 2015-05-22 NOTE — Transfer of Care (Signed)
Immediate Anesthesia Transfer of Care Note  Patient: [name] [name]  Procedure(s) Performed: Procedure(s): CARDIOVERSION (N/A)  Patient Location: PACU  Anesthesia Type:MAC  Level of Consciousness: awake, alert , oriented and patient cooperative  Airway & Oxygen Therapy: Patient Spontanous Breathing and Patient connected to nasal cannula oxygen  Post-op Assessment: Report given to RN, Post -op Vital signs reviewed and stable and Patient moving all extremities  Post vital signs: Reviewed and stable  Last Vitals:  Filed Vitals:   05/22/15 1320  BP: 138/74  Resp: 23    Complications: No apparent anesthesia complications

## 2015-05-22 NOTE — Anesthesia Preprocedure Evaluation (Addendum)
Anesthesia Evaluation  Patient identified by MRN, date of birth, ID band Patient awake    Reviewed: Allergy & Precautions, NPO status , Patient's Chart, lab work & pertinent test results  Airway Mallampati: II  TM Distance: >3 FB Neck ROM: Full    Dental no notable dental hx. (+) Teeth Intact, Dental Advisory Given   Pulmonary neg pulmonary ROS, sleep apnea , former smoker,    Pulmonary exam normal breath sounds clear to auscultation       Cardiovascular hypertension, Pt. on medications and Pt. on home beta blockers + Peripheral Vascular Disease (Thoracic aortic aneurysm )  (-) CAD + dysrhythmias (s/p multiple ablations and cardioversions) Atrial Fibrillation  Rhythm:Irregular Rate:Abnormal  Echo 12/16: Study Conclusions  - Left ventricle: The cavity size was normal. Wall thickness wasnormal. Systolic function was normal. The estimated ejectionfraction was in the range of 55% to 60%. - Aortic valve: Moderately dilated sinuses. There was trivial regurgitation. - Left atrium: The atrium was mildly dilated. - Atrial septum: No defect or patent foramen ovale was identified.    Neuro/Psych PSYCHIATRIC DISORDERS Depression negative neurological ROS  negative psych ROS   GI/Hepatic negative GI ROS, Neg liver ROS,   Endo/Other  negative endocrine ROS  Renal/GU negative Renal ROS  negative genitourinary   Musculoskeletal negative musculoskeletal ROS (+)   Abdominal   Peds negative pediatric ROS (+)  Hematology  (+) Blood dyscrasia, anemia ,   Anesthesia Other Findings Day of surgery medications reviewed with the patient.  Reproductive/Obstetrics negative OB ROS                          Anesthesia Physical Anesthesia Plan  ASA: III  Anesthesia Plan: MAC   Post-op Pain Management:    Induction: Intravenous  Airway Management Planned: Nasal Cannula  Additional Equipment:   Intra-op  Plan:   Post-operative Plan:   Informed Consent: I have reviewed the patients History and Physical, chart, labs and discussed the procedure including the risks, benefits and alternatives for the proposed anesthesia with the patient or authorized representative who has indicated his/her understanding and acceptance.   Dental advisory given  Plan Discussed with: CRNA and Anesthesiologist  Anesthesia Plan Comments: (Discussed risks/benefits/alternatives to MAC sedation including need for ventilatory support, hypotension, need for conversion to general anesthesia.  All patient questions answered.  Patient wished to proceed.)        Anesthesia Quick Evaluation

## 2015-05-22 NOTE — Progress Notes (Signed)
Patient's K rechecked prior to cardioversion and was back to normal range of 3.6. Instructed to resume potassium 67meq as previously prescribed. Patient verbalized understanding.

## 2015-05-22 NOTE — Interval H&P Note (Signed)
History and Physical Interval Note:  05/22/2015 1:20 PM  [name] [name]  has presented today for surgery, with the diagnosis of AFIB  The various methods of treatment have been discussed with the patient and family. After consideration of risks, benefits and other options for treatment, the patient has consented to  Procedure(s): CARDIOVERSION (N/A) as a surgical intervention .  The patient's history has been reviewed, patient examined, no change in status, stable for surgery.  I have reviewed the patient's chart and labs.  Questions were answered to the patient's satisfaction.     Sonita Michiels, Wonda Cheng

## 2015-05-22 NOTE — Anesthesia Procedure Notes (Signed)
Procedure Name: MAC Date/Time: 05/22/2015 1:30 PM Performed by: Izora Gala

## 2015-05-22 NOTE — Anesthesia Postprocedure Evaluation (Signed)
Anesthesia Post Note  Patient: [name] [name]  Procedure(s) Performed: Procedure(s) (LRB): CARDIOVERSION (N/A)  Patient location during evaluation: Endoscopy Anesthesia Type: MAC Level of consciousness: awake and alert Pain management: pain level controlled Vital Signs Assessment: post-procedure vital signs reviewed and stable Respiratory status: spontaneous breathing, nonlabored ventilation, respiratory function stable and patient connected to nasal cannula oxygen Cardiovascular status: stable and blood pressure returned to baseline Anesthetic complications: no    Last Vitals:  Filed Vitals:   05/22/15 1320 05/22/15 1355  BP: 138/74 103/66  Pulse:  73  Temp:  36.8 C  Resp: 23 23    Last Pain: There were no vitals filed for this visit.               Catalina Gravel

## 2015-05-22 NOTE — CV Procedure (Signed)
    Cardioversion Note  [name] [name] [id number] 13-Feb-1946  Procedure: DC Cardioversion Indications: atrial fib  Procedure Details Consent: Obtained Time Out: Verified patient identification, verified procedure, site/side was marked, verified correct patient position, special equipment/implants available, Radiology Safety Procedures followed,  medications/allergies/relevent history reviewed, required imaging and test results available.  Performed  The patient has been on adequate anticoagulation.  The patient received IV Lidocaine 60 mg followed by Propofol 80 mg iv  for sedation.  Synchronous cardioversion was performed at 120  joules.  The cardioversion was successful    Complications: No apparent complications Patient did tolerate procedure well.   Thayer Headings, Brooke Bonito., MD, [facility] 05/22/2015, 1:44 PM

## 2015-05-23 ENCOUNTER — Encounter (HOSPITAL_COMMUNITY): Payer: Self-pay | Admitting: Cardiovascular Disease

## 2015-05-26 ENCOUNTER — Other Ambulatory Visit: Payer: Self-pay | Admitting: Internal Medicine

## 2015-05-28 ENCOUNTER — Ambulatory Visit (HOSPITAL_COMMUNITY): Payer: Medicare Other | Admitting: Nurse Practitioner

## 2015-05-30 ENCOUNTER — Encounter (HOSPITAL_COMMUNITY): Payer: Self-pay | Admitting: Nurse Practitioner

## 2015-05-30 ENCOUNTER — Ambulatory Visit (HOSPITAL_COMMUNITY)
Admission: RE | Admit: 2015-05-30 | Discharge: 2015-05-30 | Disposition: A | Discharge status: Home / Self Care | Payer: Medicare Other | Source: Ambulatory Visit | Attending: Nurse Practitioner | Admitting: Nurse Practitioner

## 2015-05-30 DIAGNOSIS — I10 Essential (primary) hypertension: Secondary | ICD-10-CM | POA: Insufficient documentation

## 2015-05-30 DIAGNOSIS — I4819 Other persistent atrial fibrillation: Secondary | ICD-10-CM

## 2015-05-30 DIAGNOSIS — Z823 Family history of stroke: Secondary | ICD-10-CM | POA: Diagnosis not present

## 2015-05-30 DIAGNOSIS — G4733 Obstructive sleep apnea (adult) (pediatric): Secondary | ICD-10-CM | POA: Insufficient documentation

## 2015-05-30 DIAGNOSIS — I481 Persistent atrial fibrillation: Secondary | ICD-10-CM

## 2015-05-30 DIAGNOSIS — Z79899 Other long term (current) drug therapy: Secondary | ICD-10-CM | POA: Insufficient documentation

## 2015-05-30 DIAGNOSIS — Z8249 Family history of ischemic heart disease and other diseases of the circulatory system: Secondary | ICD-10-CM | POA: Insufficient documentation

## 2015-05-30 DIAGNOSIS — Z87891 Personal history of nicotine dependence: Secondary | ICD-10-CM | POA: Insufficient documentation

## 2015-05-30 DIAGNOSIS — Z7902 Long term (current) use of antithrombotics/antiplatelets: Secondary | ICD-10-CM | POA: Diagnosis not present

## 2015-05-30 DIAGNOSIS — E785 Hyperlipidemia, unspecified: Secondary | ICD-10-CM | POA: Insufficient documentation

## 2015-05-30 DIAGNOSIS — I4891 Unspecified atrial fibrillation: Secondary | ICD-10-CM | POA: Diagnosis present

## 2015-05-30 LAB — BASIC METABOLIC PANEL
Anion gap: 9 (ref 5–15)
BUN: 17 mg/dL (ref 6–20)
CALCIUM: 9.5 mg/dL (ref 8.9–10.3)
CHLORIDE: 103 mmol/L (ref 101–111)
CO2: 28 mmol/L (ref 22–32)
CREATININE: 1 mg/dL (ref 0.61–1.24)
GFR calc non Af Amer: >60 mL/min (ref >60)
Glucose, Bld: 97 mg/dL (ref 65–99)
Potassium: 4 mmol/L (ref 3.5–5.1)
SODIUM: 140 mmol/L (ref 135–145)

## 2015-05-30 NOTE — Progress Notes (Signed)
Patient ID: [name] [name], male   DOB: 1945-10-10, 70 y.o.   MRN: [id number]     Primary Care Physician: [name], MD Referring Physician: Dr. Macon Large is a 70 y.o. male with a h/o persistent afib who has his 3rd ablation 6/21. He left the hospital in SR but ERAF. Had successful cardioversion. Says he has had over 9 cardioversion's in his 27 years living with afib. Denied any swallowing/ rt groin issues from PVI.   He recently saw Dr. Rayann Heman 12/14 and was doing well. Had over 6 months without afib. Much better energy and was being more active/exercising and had been able to lose weight. He was in his USOH Sunday pm, when he was very aware of afib. It has been persistent since then despite increasing metoprolol. He called  requesting a cardioversion as he will not return to rhythm without it. He has been compliant with tikosyn and pradaxa, states no missed doses.  Had a cardioversion 2/15, and returns to the afib clinic 2/23 and is in [place]. He has not noticed any further afib. Feels much improved.Will recheck bmet today for abnormal K+ levels recently.  Today, he denies symptoms of palpitations, chest pain, shortness of breath, orthopnea, PND, lower extremity edema, dizziness, presyncope, syncope, or neurologic sequela. The patient is tolerating medications without difficulties and is otherwise without complaint today.   Past Medical History  Diagnosis Date  . Paroxysmal atrial fibrillation (HCC)     a. s/p afib ablations 2002 and 2009 by Dr Rolland Porter. b. s/p DCCV 02/2013. c. s/p DCCV 01/19/2014  . H/O cardiac catheterization     no significant CAD by cath 5/12  . Cardiomyopathy ([place])     a. 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 b. also hx of tachycardia mediated CM, now resolved  . Hypertension   . Hyperlipidemia   . Thoracic aortic aneurysm ([place])     a. Borderline enlarged by CT 12/14 at 4.0cm.  . Depression   . OSA (obstructive sleep apnea)    AHI avg 5/hr  . Limb pain 06/20/2007    LE venous doppler - no evidence of thrombus or thrombophlebitis  . PVC (premature ventricular contraction) 12/18/2009    cardionet monitor 21 days - some PVCs, ventricular bigeminy  . Pericardial effusion     a. Since 2007. b. s/p pericardial window 04/2013.  Marland Kitchen Echocardiogram abnormal     EF60%, mild LVH, PA pk pressure 35, pericardial effusion mild increase from 2012   . Complication of anesthesia     Pt has anxiety issues and thinks he is going to die.  . Liver mass     a. CT 03/2013: Small probable cysts in the liver and small hyperenhancing liver mass.   Past Surgical History  Procedure Laterality Date  . Cardiac catheterization  08/25/10    NO SIGNIFICANT CAD  . Cardioversion  10/18/2007    successful  . Elbow / upper arm foreign body removal    . Transesophageal echocardiogram  08/26/2010    see Notes tab  . Cardiovascular stress test  09/08/2005    R/S MV - EF 61%; Exercises capacity 12 Mets; essentially normal perfusion myocardial scan    . Cardiac electrophysiology mapping and ablation  [id number]    Dr. Tally Due  . Osteosynthesis Left 09/30/2012    with small plate in the ulna  . Cardioversion N/A 02/08/2013    Procedure: CARDIOVERSION;  Surgeon: Troy Sine, MD;  Location: [facility] ENDOS[facility];  Service: Cardiovascular;  Laterality: N/A;  . Fracture surgery Left     Left wrist  . Subxyphoid pericardial window N/A 04/25/2013    Procedure: SUBXYPHOID PERICARDIAL WINDOW;  Surgeon: Gaye Pollack, MD;  Location: [place] OR;  Service: Thoracic;  Laterality: N/A;  . Carpal tunnel release Left April 2015  . Cardioversion N/A 08/17/2013    Procedure: CARDIOVERSION;  Surgeon: Sanda Klein, MD;  Location: [place];  Service: Cardiovascular;  Laterality: N/A;  . Cardioversion N/A 12/28/2013    Procedure: CARDIOVERSION;  Surgeon: Fay Records, MD;  Location: [place];  Service: Cardiovascular;  Laterality: N/A;  . Cardioversion N/A 01/19/2014     Procedure: CARDIOVERSION;  Surgeon: Sanda Klein, MD;  Location: [facility] ENDOSCOPY;  Service: Cardiovascular;  Laterality: N/A;  . Cardioversion N/A 03/11/2014    Procedure: CARDIOVERSION;  Surgeon: Evans Lance, MD;  Location: [place];  Service: Cardiovascular;  Laterality: N/A;  . Left and right heart catheterization with coronary angiogram N/A 03/17/2013    Procedure: LEFT AND RIGHT HEART CATHETERIZATION WITH CORONARY ANGIOGRAM;  Surgeon: Troy Sine, MD;  Location: [facility] CATH LAB;  Service: Cardiovascular;  Laterality: N/A;  . Cardioversion N/A 08/23/2014    Procedure: CARDIOVERSION;  Surgeon: Lelon Perla, MD;  Location: [facility] ENDOSCOPY;  Service: Cardiovascular;  Laterality: N/A;  . Tee without cardioversion N/A 09/24/2014    Procedure: TRANSESOPHAGEAL ECHOCARDIOGRAM (TEE);  Surgeon: Dorothy Spark, MD;  Location: [facility] ENDOSCOPY;  Service: Cardiovascular;  Laterality: N/A;  . Electrophysiologic study N/A 09/25/2014    Atrial fibrillation ablation performed by Dr Rayann Heman  . Cardioversion N/A 10/04/2014    Procedure: CARDIOVERSION;  Surgeon: Sueanne Margarita, MD;  Location: [place];  Service: Cardiovascular;  Laterality: N/A;  . Cardioversion N/A 05/22/2015    Procedure: CARDIOVERSION;  Surgeon: Thayer Headings, MD;  Location: [facility] ENDOSCOPY;  Service: Cardiovascular;  Laterality: N/A;    Current Outpatient Prescriptions  Medication Sig Dispense Refill  . atorvastatin (LIPITOR) 40 MG tablet TAKE 1 TABLET BY MOUTH EVERY DAY (Patient taking differently: TAKE 1 TABLET BY MOUTH EVERY evening) 90 tablet 1  . Cholecalciferol (VITAMIN D) 2000 UNITS CAPS Take 2,000 Units by mouth daily.    Marland Kitchen diltiazem (CARDIZEM CD) 180 MG 24 hr capsule Take 1 capsule (180 mg total) by mouth daily. (Patient taking differently: Take 180 mg by mouth every evening. ) 90 capsule 3  . finasteride (PROPECIA) 1 MG tablet Take 1 mg by mouth every morning.     . furosemide (LASIX) 40 MG tablet Take 1 tablet (40 mg total) by mouth  daily. 90 tablet 3  . furosemide (LASIX) 40 MG tablet TAKE 1 TABLET BY MOUTH TWICE A DAY 180 tablet 3  . metoprolol succinate (TOPROL-XL) 25 MG 24 hr tablet Take 1 tablet (25 mg total) by mouth daily. Take with or immediately following a meal. 90 tablet 3  . pantoprazole (PROTONIX) 40 MG tablet Take 1 tablet (40 mg total) by mouth daily. Patient needs to contact office for and appointment (Patient taking differently: Take 40 mg by mouth every evening. Patient needs to contact office for and appointment) 90 tablet 3  . potassium chloride SA (K-DUR,KLOR-CON) 20 MEQ tablet Take 1.5 tablets (30 mEq total) by mouth daily. 45 tablet 9  . PRADAXA 150 MG CAPS capsule TAKE ONE CAPSULE BY MOUTH TWICE A DAY 180 capsule 3  . Saw Palmetto, Serenoa repens, (SAW PALMETTO PO) Take 900 mg by mouth 2 (two) times daily.     Marland Kitchen  silver sulfADIAZINE (SILVADENE) 1 % cream Apply 1 application topically 2 (two) times daily.  0  . TIKOSYN 500 MCG capsule TAKE ONE CAPSULE BY MOUTH TWICE DAILY 180 capsule 2   No current facility-administered medications for this encounter.    Allergies  Allergen Reactions  . Tetracyclines & Related Hives    rash    Social History   Social History  . Marital Status: Single    Spouse Name: N/A  . Number of Children: 0  . Years of Education: N/A   Occupational History  .  [place] History Main Topics  . Smoking status: Former Smoker -- 1.00 packs/day for 10 years    Quit date: 02/06/1989  . Smokeless tobacco: Never Used  . Alcohol Use: No     Comment: 2 bottles of wine per week, pt states has not drank alcohol since christmas  . Drug Use: No  . Sexual Activity: Not on file   Other Topics Concern  . 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    Family History    Problem Relation Age of Onset  . Coronary artery disease Father   . Stroke Mother     died  . Atrial fibrillation Brother   . Heart attack Father   . Mitral valve prolapse Sister     ROS- All systems are reviewed and negative except as per the HPI above  Physical Exam: Filed Vitals:   05/30/15 1129  BP: 118/82  Pulse: 67  Height: 6\' 2"  (1.88 m)  Weight: 187 lb 6.4 oz (85.004 kg)    GEN- The patient is well appearing, alert and oriented x 3 today.   Head- normocephalic, atraumatic Eyes-  Sclera clear, conjunctiva pink Ears- hearing intact Oropharynx- clear Neck- supple, no JVP Lymph- no cervical lymphadenopathy Lungs- Clear to ausculation bilaterally, normal work of breathing Heart- Regular rate and rhythm, no murmurs, rubs or gallops, PMI not laterally displaced GI- soft, NT, ND, + BS Extremities- no clubbing, cyanosis, or edema MS- no significant deformity or atrophy Skin- no rash or lesion Psych- euthymic mood, full affect Neuro- strength and sensation are intact  EKG-SR with v rate at 67 bpm, one PC noted. Pr int 204 ms, qrs int 96 ms, Qtc 435 ms Epic records reviewed.   Assessment and Plan: 1.afib S/p successful cardioversion Continue dofetilide, diltiazem/metoprolol Continue pradaxa Bmet   F/u Dr. Rayann Heman in April as scheduled  Geroge Baseman. Candia Kingsbury, [facility] [address] [place], [place] [phone]

## 2015-07-02 ENCOUNTER — Encounter: Payer: Self-pay | Admitting: Internal Medicine

## 2015-07-03 ENCOUNTER — Telehealth: Payer: Self-pay | Admitting: *Deleted

## 2015-07-03 ENCOUNTER — Encounter: Payer: Self-pay | Admitting: Internal Medicine

## 2015-07-03 MED ORDER — RIVAROXABAN 20 MG PO TABS: 20.0000 mg | ORAL_TABLET | Freq: Every day | ORAL | Status: DC

## 2015-07-03 NOTE — Telephone Encounter (Signed)
Patient called and stated that Dr Rayann Heman had told him before that if he wanted to change from pradaxa to xarelto to let us know. He is almost out of pradaxa and is ready to make the medication change. [facility] [address]. Please advise. Thanks, MI

## 2015-07-03 NOTE — Telephone Encounter (Signed)
Discussed with Dr Rayann Heman okay to change to Xarelto 20 mg daily

## 2015-07-05 ENCOUNTER — Telehealth: Payer: Self-pay

## 2015-07-05 NOTE — Telephone Encounter (Signed)
Prior auth for Xarelto 20mg sent to [facility]. 

## 2015-07-09 ENCOUNTER — Telehealth (HOSPITAL_COMMUNITY): Payer: Self-pay | Admitting: *Deleted

## 2015-07-09 NOTE — Telephone Encounter (Signed)
Pt called in stating he just realized he accidentally took 2 doses of his morning medications (including tikosyn). States he has felt fine today so does not think he needs to be seen but wondering what he should do about his evening dose of tikosyn.  Roderic Palau NP discussed with pharmacist and instructed to skip dose tonight and resume normal dosing in the morning. Patient was also questioning whether when he switches from pradaxa to xarelto if he should have his potassium checked since he read on the medication insert it can effect potassium.  Also was discussed with pharmacist and no further testing would be needed in that aspect. Patient verbalized understanding of all instructions.

## 2015-08-14 ENCOUNTER — Telehealth: Payer: Self-pay | Admitting: Pharmacist

## 2015-08-14 NOTE — Telephone Encounter (Signed)
Received fax from [facility] that pt is having a flexible sigmoidoscopy on 524 at [facility]. Pt is on Xarelto for afib, CHADS2 score is 1 (HTN). Pt on Tikosyn since 2015, most recent EKG shows pt in NSR. Ok to hold Xarelto x24 hours. Clearance faxed to [facility] [phone], attn Dr. Michail Sermon.

## 2015-08-19 ENCOUNTER — Encounter: Payer: Self-pay | Admitting: Internal Medicine

## 2015-08-27 ENCOUNTER — Other Ambulatory Visit: Payer: Self-pay | Admitting: Gastroenterology

## 2015-08-28 ENCOUNTER — Encounter (HOSPITAL_COMMUNITY): Payer: Self-pay

## 2015-08-28 ENCOUNTER — Encounter (HOSPITAL_COMMUNITY)
Admission: RE | Disposition: A | Discharge status: Home / Self Care | Payer: Self-pay | Source: Ambulatory Visit | Attending: Gastroenterology

## 2015-08-28 ENCOUNTER — Ambulatory Visit (HOSPITAL_COMMUNITY): Admission: RE | Admit: 2015-08-28 | Payer: Medicare Other | Source: Ambulatory Visit | Admitting: Gastroenterology

## 2015-08-28 ENCOUNTER — Ambulatory Visit (HOSPITAL_COMMUNITY)
Admission: RE | Admit: 2015-08-28 | Discharge: 2015-08-28 | Disposition: A | Discharge status: Home / Self Care | Payer: Medicare Other | Source: Ambulatory Visit | Attending: Gastroenterology | Admitting: Gastroenterology

## 2015-08-28 ENCOUNTER — Encounter (HOSPITAL_COMMUNITY): Admission: RE | Payer: Self-pay | Source: Ambulatory Visit

## 2015-08-28 DIAGNOSIS — K648 Other hemorrhoids: Secondary | ICD-10-CM | POA: Diagnosis not present

## 2015-08-28 DIAGNOSIS — G4733 Obstructive sleep apnea (adult) (pediatric): Secondary | ICD-10-CM | POA: Diagnosis not present

## 2015-08-28 DIAGNOSIS — D126 Benign neoplasm of colon, unspecified: Secondary | ICD-10-CM | POA: Diagnosis present

## 2015-08-28 DIAGNOSIS — H35033 Hypertensive retinopathy, bilateral: Secondary | ICD-10-CM | POA: Diagnosis not present

## 2015-08-28 DIAGNOSIS — Z7901 Long term (current) use of anticoagulants: Secondary | ICD-10-CM | POA: Diagnosis not present

## 2015-08-28 DIAGNOSIS — I2510 Atherosclerotic heart disease of native coronary artery without angina pectoris: Secondary | ICD-10-CM | POA: Insufficient documentation

## 2015-08-28 DIAGNOSIS — I481 Persistent atrial fibrillation: Secondary | ICD-10-CM | POA: Insufficient documentation

## 2015-08-28 DIAGNOSIS — Z79899 Other long term (current) drug therapy: Secondary | ICD-10-CM | POA: Insufficient documentation

## 2015-08-28 DIAGNOSIS — D128 Benign neoplasm of rectum: Secondary | ICD-10-CM | POA: Insufficient documentation

## 2015-08-28 DIAGNOSIS — I10 Essential (primary) hypertension: Secondary | ICD-10-CM | POA: Diagnosis not present

## 2015-08-28 DIAGNOSIS — K640 First degree hemorrhoids: Secondary | ICD-10-CM | POA: Diagnosis not present

## 2015-08-28 HISTORY — PX: HOT HEMOSTASIS: SHX5433

## 2015-08-28 HISTORY — PX: FLEXIBLE SIGMOIDOSCOPY: SHX5431

## 2015-08-28 SURGERY — SIGMOIDOSCOPY, FLEXIBLE
Anesthesia: Moderate Sedation

## 2015-08-28 MED ORDER — FENTANYL CITRATE (PF) 100 MCG/2ML IJ SOLN
INTRAMUSCULAR | Status: AC
  Filled 2015-08-28: qty 2

## 2015-08-28 MED ORDER — DIPHENHYDRAMINE HCL 50 MG/ML IJ SOLN
INTRAMUSCULAR | Status: AC
  Filled 2015-08-28: qty 1

## 2015-08-28 MED ORDER — RIVAROXABAN 20 MG PO TABS: 20.0000 mg | ORAL_TABLET | Freq: Every day | ORAL | Status: DC

## 2015-08-28 MED ORDER — FENTANYL CITRATE (PF) 100 MCG/2ML IJ SOLN
INTRAMUSCULAR | Status: DC | PRN
  Administered 2015-08-28 (×3): 25 ug via INTRAVENOUS

## 2015-08-28 MED ORDER — MIDAZOLAM HCL 10 MG/2ML IJ SOLN
INTRAMUSCULAR | Status: DC | PRN
  Administered 2015-08-28: 2 mg via INTRAVENOUS
  Administered 2015-08-28: 1 mg via INTRAVENOUS
  Administered 2015-08-28 (×2): 2 mg via INTRAVENOUS

## 2015-08-28 MED ORDER — MIDAZOLAM HCL 5 MG/ML IJ SOLN
INTRAMUSCULAR | Status: AC
  Filled 2015-08-28: qty 2

## 2015-08-28 MED ORDER — SODIUM CHLORIDE 0.9 % IV SOLN
INTRAVENOUS | Status: DC
  Administered 2015-08-28: 09:00:00 via INTRAVENOUS

## 2015-08-28 NOTE — Interval H&P Note (Signed)
History and Physical Interval Note:  08/28/2015 10:25 AM  [name] [name]  has presented today for surgery, with the diagnosis of bleeding  The various methods of treatment have been discussed with the patient and family. After consideration of risks, benefits and other options for treatment, the patient has consented to  Procedure(s): FLEXIBLE SIGMOIDOSCOPY (N/A) HOT HEMOSTASIS (ARGON PLASMA COAGULATION/BICAP) (N/A) as a surgical intervention .  The patient's history has been reviewed, patient examined, no change in status, stable for surgery.  I have reviewed the patient's chart and labs.  Questions were answered to the patient's satisfaction.     Stokes C.

## 2015-08-28 NOTE — Discharge Instructions (Addendum)
Avoid all aspirin and ibuprofen products for the next 2 weeks. Will call you when the pathology results are complete. Flexible Sigmoidoscopy, Care After Refer to this sheet in the next few weeks. These instructions provide you with information on caring for yourself after your procedure. Your health care provider may also give you more specific instructions. Your treatment has been planned according to current medical practices, but problems sometimes occur. Call your health care provider if you have any problems or questions after your procedure. WHAT TO EXPECT AFTER THE PROCEDURE After your procedure, it is typical to have the following:   Abdominal cramps.  Bloating.  A small amount of rectal bleeding if you had a biopsy. HOME CARE INSTRUCTIONS  Only take over-the-counter or prescription medicines for pain, fever, or discomfort as directed by your health care provider.  Resume your normal diet and activities as directed by your health care provider. SEEK MEDICAL CARE IF:  You have abdominal pain or cramping that lasts longer than 1 hour after the procedure.  You continue to have small amounts of rectal bleeding after 24 hours.  You have nausea or vomiting.  You feel weak or dizzy. SEEK IMMEDIATE MEDICAL CARE IF:   You have a fever.  You pass large blood clots or see a large amount of blood in the toilet after having a bowel movement. This may also occur 10-14 days after the procedure. It is more likely if you had a biopsy.  You develop abdominal pain that is not relieved with medicine or your abdominal pain gets worse.  You have nausea or vomiting for more than 24 hours after the procedure.   This information is not intended to replace advice given to you by your health care provider. Make sure you discuss any questions you have with your health care provider.   Document Released: 03/28/2013 Document Reviewed: 03/28/2013 Elsevier Interactive Patient Education [employer].

## 2015-08-28 NOTE — H&P (Signed)
  Date of Initial H&P: 08/12/15  History reviewed, patient examined, no change in status, stable for surgery.

## 2015-08-28 NOTE — Op Note (Signed)
[facility] Patient Name: [name] [name] Procedure Date: 08/28/2015 MRN: [id number] Attending MD: Lear Ng , MD Date of Birth: 12-Mar-1946 CSN: [id number] Age: 70 Admit Type: Outpatient Procedure:                Flexible Sigmoidoscopy Indications:              Adenomatous polyps in the colon, For therapy of                            adenomatous polyps in the colon Providers:                Lear Ng, MD, Tory Emerald, RN, Vista Lawman, RN, William Dalton, Technician Referring MD:              Medicines:                Fentanyl 75 micrograms IV, Midazolam 7 mg IV Complications:            No immediate complications. Estimated Blood Loss:     Estimated blood loss: none. Procedure:                Pre-Anesthesia Assessment:                           - Prior to the procedure, a History and Physical                            was performed, and patient medications and                            allergies were reviewed. The patient's tolerance of                            previous anesthesia was also reviewed. The risks                            and benefits of the procedure and the sedation                            options and risks were discussed with the patient.                            All questions were answered, and informed consent                            was obtained. Prior Anticoagulants: The patient has                            taken Xarelto (rivaroxaban), last dose was 3 days                            prior to procedure. ASA Grade Assessment: III - A  patient with severe systemic disease. After                            reviewing the risks and benefits, the patient was                            deemed in satisfactory condition to undergo the                            procedure.                           After obtaining informed consent, the scope was   passed under direct vision. The [id number]                            scope was introduced through the anus and advanced                            to the the rectosigmoid junction. The flexible                            sigmoidoscopy was accomplished without difficulty.                            The patient tolerated the procedure well. The                            quality of the bowel preparation was adequate and                            good. Scope In: 10:30:16 AM Scope Out: 10:49:51 AM Total Procedure Duration: 0 hours 19 minutes 35 seconds  Findings:      The perianal and digital rectal examinations were normal.      A 18 mm polyp was found in the rectum. The polyp was sessile. The polyp       was removed with a hot snare. Resection and retrieval were complete.       Estimated blood loss: none. Fulguration to ablate the lesion by argon       plasma was successful. Estimated blood loss: none. To prevent bleeding       after the polypectomy, hemostatic clip placement was unsuccessful       despite one attempt due to the angle of the polypectomy site and the       width unable to get the proximal side of the hemoclip over the proximal       edge of the polypectomy site. A small amount of bleeding occurred from       the distal edge of the hemoclip that resolved spontaneously.      Internal hemorrhoids were found during retroflexion. The hemorrhoids       were medium-sized and Grade I (internal hemorrhoids that do not       prolapse). Impression:               - One 18 mm polyp in the rectum, removed with a hot  snare. Resected and retrieved. Treated with argon                            plasma coagulation (APC). SKIP.                           - Internal hemorrhoids. Moderate Sedation:      Moderate (conscious) sedation was administered by the endoscopy nurse       and supervised by the endoscopist. The following parameters were        monitored: oxygen saturation, heart rate, blood pressure, and response       to care. Recommendation:           - Await pathology results.                           - Resume Xarelto (rivaroxaban) at prior dose in 5                            days.                           - No aspirin, ibuprofen, naproxen, or other                            non-steroidal anti-inflammatory drugs for 2 weeks. Procedure Code(s):        --- Professional ---                           [phone], 9, Sigmoidoscopy, flexible; with ablation                            of tumor(s), polyp(s), or other lesion(s) (includes                            pre- and post-dilation and guide wire passage, when                            performed) Diagnosis Code(s):        --- Professional ---                           K62.1, Rectal polyp                           D12.6, Benign neoplasm of colon, unspecified                           K64.0, First degree hemorrhoids CPT copyright 2016 American Medical Association. All rights reserved. The codes documented in this report are preliminary and upon coder review may  be revised to meet current compliance requirements. [name], MD Lear Ng, MD 08/28/2015 11:01:44 AM This report has been signed electronically. Number of Addenda: 0

## 2015-09-02 ENCOUNTER — Encounter (HOSPITAL_COMMUNITY): Payer: Self-pay | Admitting: Gastroenterology

## 2015-09-03 ENCOUNTER — Other Ambulatory Visit: Payer: Self-pay | Admitting: Cardiovascular Disease

## 2015-09-04 NOTE — Telephone Encounter (Signed)
Yes fill under [facility] for now and will have [facility] address follow up with general cardiology

## 2015-09-09 ENCOUNTER — Encounter: Payer: Self-pay | Admitting: Internal Medicine

## 2015-09-09 ENCOUNTER — Other Ambulatory Visit: Payer: Self-pay | Admitting: *Deleted

## 2015-09-09 MED ORDER — ATORVASTATIN CALCIUM 40 MG PO TABS: 40.0000 mg | ORAL_TABLET | Freq: Every evening | ORAL | Status: DC

## 2015-09-10 DIAGNOSIS — H35033 Hypertensive retinopathy, bilateral: Secondary | ICD-10-CM | POA: Insufficient documentation

## 2015-09-11 ENCOUNTER — Telehealth: Payer: Self-pay | Admitting: Internal Medicine

## 2015-09-11 NOTE — Telephone Encounter (Signed)
Not sure how we can print out a copy of the original rx from 2015.

## 2015-09-11 NOTE — Telephone Encounter (Signed)
New message      We called in a presc for tikosyn on 03-12-14 to [facility].  They could not fill it and sent the presc to another pharmacy.  Now they need a copy of the presc for insurance purposes.  The forwarded pharmacy no longer has the presc.  Can you print the original presc and fax it to [phone] Christ Kick at the [facility] office?  (Medical records and the refill dept could not help me)

## 2015-09-11 NOTE — Telephone Encounter (Signed)
Call originated from [facility] which I was not given a call back number for, only a fax number. I called the local [facility] listed on file for the patient and was informed by the pharmacist that the rx was originally at [facility] and she is not sure why [facility] is trying to get our office involved. Apparently, they are conducting some type of audit but the pharmacist stated that corporate should have been able to see the same information in the computer system that she saw. [facility] to see if [facility] can get a copy of the original.

## 2015-09-25 ENCOUNTER — Other Ambulatory Visit: Payer: Self-pay | Admitting: Internal Medicine

## 2015-09-29 ENCOUNTER — Other Ambulatory Visit: Payer: Self-pay | Admitting: Cardiovascular Disease

## 2015-09-30 ENCOUNTER — Encounter: Payer: Self-pay | Admitting: Internal Medicine

## 2015-09-30 ENCOUNTER — Ambulatory Visit (INDEPENDENT_AMBULATORY_CARE_PROVIDER_SITE_OTHER): Payer: Medicare Other | Admitting: Internal Medicine

## 2015-09-30 VITALS — BP 116/70 | HR 71 | Ht 74.0 in | Wt 181.0 lb

## 2015-09-30 DIAGNOSIS — I429 Cardiomyopathy, unspecified: Secondary | ICD-10-CM | POA: Diagnosis not present

## 2015-09-30 DIAGNOSIS — I10 Essential (primary) hypertension: Secondary | ICD-10-CM | POA: Diagnosis not present

## 2015-09-30 DIAGNOSIS — I480 Paroxysmal atrial fibrillation: Secondary | ICD-10-CM

## 2015-09-30 DIAGNOSIS — I4819 Other persistent atrial fibrillation: Secondary | ICD-10-CM

## 2015-09-30 DIAGNOSIS — I481 Persistent atrial fibrillation: Secondary | ICD-10-CM | POA: Diagnosis not present

## 2015-09-30 LAB — CBC WITH DIFFERENTIAL/PLATELET
BASOS PCT: 1 %
Basophils Absolute: 62 cells/uL (ref 0–200)
EOS PCT: 2 %
Eosinophils Absolute: 124 cells/uL (ref 15–500)
HCT: 37 % — ABNORMAL LOW (ref 38.5–50.0)
HEMOGLOBIN: 12.3 g/dL — AB (ref 13.2–17.1)
LYMPHS ABS: 2108 {cells}/uL (ref 850–3900)
Lymphocytes Relative: 34 %
MCH: 28.3 pg (ref 27.0–33.0)
MCHC: 33.2 g/dL (ref 32.0–36.0)
MCV: 85.3 fL (ref 80.0–100.0)
MPV: 11.2 fL (ref 7.5–12.5)
Monocytes Absolute: 744 cells/uL (ref 200–950)
Monocytes Relative: 12 %
NEUTROS ABS: 3162 {cells}/uL (ref 1500–7800)
Neutrophils Relative %: 51 %
Platelets: 240 10*3/uL (ref 140–400)
RBC: 4.34 MIL/uL (ref 4.20–5.80)
RDW: 13.7 % (ref 11.0–15.0)
WBC: 6.2 10*3/uL (ref 3.8–10.8)

## 2015-09-30 NOTE — Patient Instructions (Signed)
Medication Instructions:  Your physician recommends that you continue on your current medications as directed. Please refer to the Current Medication list given to you today.  Labwork: Your physician recommends that you return for lab work today: BMP/CBC/Mag   Testing/Procedures: None ordered   Follow-Up: Your physician wants you to follow-up in: 6 months with Dr Rayann Heman Dennis Bast will receive a reminder letter in the mail two months in advance. If you don't receive a letter, please call our office to schedule the follow-up appointment.   Any Other Special Instructions Will Be Listed Below (If Applicable).     If you need a refill on your cardiac medications before your next appointment, please call your pharmacy.

## 2015-09-30 NOTE — Progress Notes (Signed)
Electrophysiology Office Note   Date:  09/30/2015   ID:  DAEVON NORQUIST, DOB 04-Mar-1946, MRN [id number]  PCP:  [name], MD  Cardiologist:  Dr Claiborne Billings Primary Electrophysiologist: Thompson Grayer, MD    Chief Complaint  Patient presents with  . Atrial Fibrillation     History of Present Illness: [name] [name] is a 70 y.o. male who presents today for electrophysiology evaluation.  Doing well.  He did have afib in February during a very stressful time at work.  He has since retired and is doing well.  No further AF.  Today, he denies symptoms of palpitations, chest pain,  orthopnea, PND, lower extremity edema, claudication, dizziness, presyncope, syncope, bleeding, or neurologic sequela.    Past Medical History  Diagnosis Date  . Paroxysmal atrial fibrillation (HCC)     a. s/p afib ablations 2002 and 2009 by Dr Rolland Porter. b. s/p DCCV 02/2013. c. s/p DCCV 01/19/2014  . H/O cardiac catheterization     no significant CAD by cath 5/12  . Cardiomyopathy ([place])     a. 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 b. also hx of tachycardia mediated CM, now resolved  . Hypertension   . Hyperlipidemia   . Thoracic aortic aneurysm ([place])     a. Borderline enlarged by CT 12/14 at 4.0cm.  . Depression   . OSA (obstructive sleep apnea)     AHI avg 5/hr  . Limb pain 06/20/2007    LE venous doppler - no evidence of thrombus or thrombophlebitis  . PVC (premature ventricular contraction) 12/18/2009    cardionet monitor 21 days - some PVCs, ventricular bigeminy  . Pericardial effusion     a. Since 2007. b. s/p pericardial window 04/2013.  Marland Kitchen Echocardiogram abnormal     EF60%, mild LVH, PA pk pressure 35, pericardial effusion mild increase from 2012   . Complication of anesthesia     Pt has anxiety issues and thinks he is going to die.  . Liver mass     a. CT 03/2013: Small probable cysts in the liver and small hyperenhancing liver mass.   Past Surgical History  Procedure  Laterality Date  . Cardiac catheterization  08/25/10    NO SIGNIFICANT CAD  . Cardioversion  10/18/2007    successful  . Elbow / upper arm foreign body removal    . Transesophageal echocardiogram  08/26/2010    see Notes tab  . Cardiovascular stress test  09/08/2005    R/S MV - EF 61%; Exercises capacity 12 Mets; essentially normal perfusion myocardial scan    . Cardiac electrophysiology mapping and ablation  [id number]    Dr. Tally Due  . Osteosynthesis Left 09/30/2012    with small plate in the ulna  . Cardioversion N/A 02/08/2013    Procedure: CARDIOVERSION;  Surgeon: Troy Sine, MD;  Location: [facility] ENDOSCOPY;  Service: Cardiovascular;  Laterality: N/A;  . Fracture surgery Left     Left wrist  . Subxyphoid pericardial window N/A 04/25/2013    Procedure: SUBXYPHOID PERICARDIAL WINDOW;  Surgeon: Gaye Pollack, MD;  Location: [place] OR;  Service: Thoracic;  Laterality: N/A;  . Carpal tunnel release Left April 2015  . Cardioversion N/A 08/17/2013    Procedure: CARDIOVERSION;  Surgeon: Sanda Klein, MD;  Location: [place];  Service: Cardiovascular;  Laterality: N/A;  . Cardioversion N/A 12/28/2013    Procedure: CARDIOVERSION;  Surgeon: Fay Records, MD;  Location: [place];  Service: Cardiovascular;  Laterality: N/A;  .  Cardioversion N/A 01/19/2014    Procedure: CARDIOVERSION;  Surgeon: Sanda Klein, MD;  Location: [place];  Service: Cardiovascular;  Laterality: N/A;  . Cardioversion N/A 03/11/2014    Procedure: CARDIOVERSION;  Surgeon: Evans Lance, MD;  Location: [place];  Service: Cardiovascular;  Laterality: N/A;  . Left and right heart catheterization with coronary angiogram N/A 03/17/2013    Procedure: LEFT AND RIGHT HEART CATHETERIZATION WITH CORONARY ANGIOGRAM;  Surgeon: Troy Sine, MD;  Location: [facility] CATH LAB;  Service: Cardiovascular;  Laterality: N/A;  . Cardioversion N/A 08/23/2014    Procedure: CARDIOVERSION;  Surgeon: Lelon Perla, MD;  Location: [facility]  ENDOSCOPY;  Service: Cardiovascular;  Laterality: N/A;  . Tee without cardioversion N/A 09/24/2014    Procedure: TRANSESOPHAGEAL ECHOCARDIOGRAM (TEE);  Surgeon: Dorothy Spark, MD;  Location: [facility] ENDOSCOPY;  Service: Cardiovascular;  Laterality: N/A;  . Electrophysiologic study N/A 09/25/2014    Atrial fibrillation ablation performed by Dr Rayann Heman  . Cardioversion N/A 10/04/2014    Procedure: CARDIOVERSION;  Surgeon: Sueanne Margarita, MD;  Location: [place];  Service: Cardiovascular;  Laterality: N/A;  . Cardioversion N/A 05/22/2015    Procedure: CARDIOVERSION;  Surgeon: Thayer Headings, MD;  Location: [place];  Service: Cardiovascular;  Laterality: N/A;  . Flexible sigmoidoscopy N/A 08/28/2015    Procedure: FLEXIBLE SIGMOIDOSCOPY;  Surgeon: [name], MD;  Location: [facility] ENDOSCOPY;  Service: Endoscopy;  Laterality: N/A;  . Hot hemostasis N/A 08/28/2015    Procedure: HOT HEMOSTASIS (ARGON PLASMA COAGULATION/BICAP);  Surgeon: [name], MD;  Location: [facility] ENDOSCOPY;  Service: Endoscopy;  Laterality: N/A;     Current Outpatient Prescriptions  Medication Sig Dispense Refill  . atorvastatin (LIPITOR) 40 MG tablet Take 1 tablet (40 mg total) by mouth every evening. 90 tablet 1  . Cholecalciferol (VITAMIN D) 2000 UNITS CAPS Take 2,000 Units by mouth daily.    Marland Kitchen diltiazem (CARDIZEM CD) 180 MG 24 hr capsule Take 180 mg by mouth daily. Take 1 tablet in the PM    . finasteride (PROPECIA) 1 MG tablet Take 1 mg by mouth every morning.     . furosemide (LASIX) 40 MG tablet Take 1 tablet (40 mg total) by mouth daily. 90 tablet 3  . metoprolol succinate (TOPROL-XL) 25 MG 24 hr tablet Take 1 tablet (25 mg total) by mouth daily. Take with or immediately following a meal. 90 tablet 3  . metoprolol succinate (TOPROL-XL) 25 MG 24 hr tablet Take 12.5 mg by mouth daily. Take (1/2) tablet by mouth daily    . pantoprazole (PROTONIX) 40 MG tablet Take 40 mg by mouth daily. Take 1 tablet in the PM    .  potassium chloride SA (K-DUR,KLOR-CON) 20 MEQ tablet Take 1.5 tablets (30 mEq total) by mouth daily. 45 tablet 9  . rivaroxaban (XARELTO) 20 MG TABS tablet Take 1 tablet (20 mg total) by mouth daily with supper. 30 tablet 11  . Saw Palmetto, Serenoa repens, (SAW PALMETTO PO) Take 900 mg by mouth 2 (two) times daily.     . silver sulfADIAZINE (SILVADENE) 1 % cream Apply 1 application topically 2 (two) times daily.  0  . TIKOSYN 500 MCG capsule TAKE ONE CAPSULE BY MOUTH TWICE DAILY 180 capsule 2   No current facility-administered medications for this visit.    Allergies:   Tetracyclines & related   Social History:  The patient  reports that he quit smoking about 26 years ago. He has never used smokeless tobacco. He reports that he does not  drink alcohol or use illicit drugs.   Family History:  The patient's  family history includes Atrial fibrillation in his brother; Coronary artery disease in his father; Heart attack in his father; Mitral valve prolapse in his sister; Stroke in his mother.    ROS:  Please see the history of present illness.   All other systems are reviewed and negative.    PHYSICAL EXAM: VS:  BP 116/70 mmHg  Pulse 71  Ht 6\' 2"  (1.88 m)  Wt 181 lb (82.101 kg)  BMI 23.23 kg/m2  SpO2 98% , BMI Body mass index is 23.23 kg/(m^2). GEN: Well nourished, well developed, in no acute distress HEENT: normal Neck: no JVD, carotid bruits, or masses Cardiac: RRR; no murmurs, rubs, or gallops,no edema  Respiratory:  clear to auscultation bilaterally, normal work of breathing GI: soft, nontender, nondistended, + BS MS: no deformity or atrophy Skin: warm and dry  Neuro:  Strength and sensation are intact Psych: euthymic mood, full affect  EKG:  EKG is ordered today. The ekg ordered today shows sinus rhythm 71 bpm, PR 206 msec, septal infarct   Recent Labs: 05/21/2015: Magnesium 2.1; Platelets 204 05/22/2015: Hemoglobin 15.3 05/30/2015: BUN 17; Creatinine, Ser 1.00; Potassium  4.0; Sodium 140    Lipid Panel     Component Value Date/Time   CHOL  08/23/2007 0910    188        ATP III CLASSIFICATION:  <200     mg/dL   Desirable  200-239  mg/dL   Borderline High  >=240    mg/dL   High   TRIG 63 08/23/2007 0910   HDL 47 08/23/2007 0910   CHOLHDL 4.0 08/23/2007 0910   VLDL 13 08/23/2007 0910   LDLCALC * 08/23/2007 0910    128        Total Cholesterol/HDL:CHD Risk Coronary Heart Disease Risk Table                     Men   Women  1/2 Average Risk   3.4   3.3     Wt Readings from Last 3 Encounters:  09/30/15 181 lb (82.101 kg)  08/28/15 187 lb (84.823 kg)  05/30/15 187 lb 6.4 oz (85.004 kg)     ASSESSMENT AND PLAN:  1. Persistent afib Doing great at this time No changes today On xarelto Check bmet, mg, and cbc today  2. OSA Compliance with CPAP is encouraged.    3. HTN Stable  4. CAD Cardiac CT previously abnormal, however myoview is low risk Continue medical therapy  Current medicines are reviewed at length with the patient today.   The patient does not have concerns regarding his medicines.  The following changes were made today:  none  Return to see me in 6 months   Signed, Thompson Grayer, MD  09/30/2015 3:17 PM     [place] [address] [phone] (office) [phone] (fax)

## 2015-10-01 LAB — BASIC METABOLIC PANEL
BUN: 19 mg/dL (ref 7–25)
CALCIUM: 9.7 mg/dL (ref 8.6–10.3)
CO2: 31 mmol/L (ref 20–31)
Chloride: 101 mmol/L (ref 98–110)
Creat: 0.87 mg/dL (ref 0.70–1.25)
Glucose, Bld: 92 mg/dL (ref 65–99)
POTASSIUM: 4 mmol/L (ref 3.5–5.3)
SODIUM: 141 mmol/L (ref 135–146)

## 2015-10-01 LAB — MAGNESIUM: MAGNESIUM: 1.8 mg/dL (ref 1.5–2.5)

## 2015-12-22 ENCOUNTER — Other Ambulatory Visit: Payer: Self-pay | Admitting: Internal Medicine

## 2016-01-30 ENCOUNTER — Other Ambulatory Visit: Payer: Self-pay | Admitting: Internal Medicine

## 2016-01-30 NOTE — Telephone Encounter (Signed)
Last office visit and snapshot have metoprolol listed twice but with two different sigs. Looks like the 1/2 tablet qd was pulled in as a historical med as an error, but just wanted to be sure. Please advise. Thanks, MI

## 2016-01-30 NOTE — Telephone Encounter (Signed)
Verify with patient the dosage and fill accordingly

## 2016-01-31 ENCOUNTER — Other Ambulatory Visit: Payer: Self-pay | Admitting: *Deleted

## 2016-01-31 NOTE — Telephone Encounter (Signed)
Spoke with patient and he was very vocal with his frustration that every time he come into the office he explains how he is taking this medication but he doesn't understand why it has never been corrected. He stated that when he goes into the hospital they always question how he takes this medication as it is listed twice on his chart. He states that he takes one-half tablet,12.5 mg qd. I will refill and update med list.

## 2016-03-03 ENCOUNTER — Other Ambulatory Visit: Payer: Self-pay | Admitting: Internal Medicine

## 2016-03-09 ENCOUNTER — Other Ambulatory Visit: Payer: Self-pay | Admitting: *Deleted

## 2016-03-09 MED ORDER — TIKOSYN 500 MCG PO CAPS: 500.0000 ug | ORAL_CAPSULE | Freq: Two times a day (BID) | ORAL | 2 refills | Status: DC

## 2016-03-18 ENCOUNTER — Encounter: Payer: Self-pay | Admitting: Internal Medicine

## 2016-03-25 ENCOUNTER — Encounter: Payer: Self-pay | Admitting: Internal Medicine

## 2016-03-25 ENCOUNTER — Ambulatory Visit (INDEPENDENT_AMBULATORY_CARE_PROVIDER_SITE_OTHER): Payer: Medicare Other | Admitting: Internal Medicine

## 2016-03-25 VITALS — BP 107/64 | HR 71 | Ht 74.0 in | Wt 174.2 lb

## 2016-03-25 DIAGNOSIS — I481 Persistent atrial fibrillation: Secondary | ICD-10-CM

## 2016-03-25 DIAGNOSIS — I4819 Other persistent atrial fibrillation: Secondary | ICD-10-CM

## 2016-03-25 NOTE — Patient Instructions (Signed)
Medication Instructions:  Your physician has recommended you make the following change in your medication:  1) Stop Metoprolol in [place]: Your physician recommends that you return for lab work today: BMP/Mag   Testing/Procedures: None ordered   Follow-Up: Your physician wants you to follow-up in: 6 months with Dr Rayann Heman Dennis Bast will receive a reminder letter in the mail two months in advance. If you don't receive a letter, please call our office to schedule the follow-up appointment.   Any Other Special Instructions Will Be Listed Below (If Applicable).     If you need a refill on your cardiac medications before your next appointment, please call your pharmacy.

## 2016-03-25 NOTE — Progress Notes (Signed)
Electrophysiology Office Note   Date:  03/25/2016   ID:  [name] [name], DOB Jun 08, 1945, MRN [id number]  PCP:  [name] Nip, MD  Cardiologist:  Dr Claiborne Billings Primary Electrophysiologist: [name] Grayer, MD    Chief Complaint  Patient presents with  . Atrial Fibrillation     History of Present Illness: [name] [name] is a 70 y.o. male who presents today for electrophysiology evaluation.  Doing well.   No further AF since last february.  Exercising daily and weight is well controlled.  Today, he denies symptoms of palpitations, chest pain,  orthopnea, PND, lower extremity edema, claudication, dizziness, presyncope, syncope, bleeding, or neurologic sequela.    Past Medical History:  Diagnosis Date  . Cardiomyopathy ([place])    a. 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 b. also hx of tachycardia mediated CM, now resolved  . Complication of anesthesia    Pt has anxiety issues and thinks he is going to die.  . Depression   . Echocardiogram abnormal    EF60%, mild LVH, PA pk pressure 35, pericardial effusion mild increase from 2012   . H/O cardiac catheterization    no significant CAD by cath 5/12  . Hyperlipidemia   . Hypertension   . Limb pain 06/20/2007   LE venous doppler - no evidence of thrombus or thrombophlebitis  . Liver mass    a. CT 03/2013: Small probable cysts in the liver and small hyperenhancing liver mass.  . OSA (obstructive sleep apnea)    AHI avg 5/hr  . Paroxysmal atrial fibrillation (HCC)    a. s/p afib ablations 2002 and 2009 by Dr Rolland Porter. b. s/p DCCV 02/2013. c. s/p DCCV 01/19/2014  . Pericardial effusion    a. Since 2007. b. s/p pericardial window 04/2013.  Marland Kitchen PVC (premature ventricular contraction) 12/18/2009   cardionet monitor 21 days - some PVCs, ventricular bigeminy  . Thoracic aortic aneurysm ([place])    a. Borderline enlarged by CT 12/14 at 4.0cm.   Past Surgical History:  Procedure Laterality Date  . CARDIAC CATHETERIZATION  08/25/10     NO SIGNIFICANT CAD  . CARDIAC ELECTROPHYSIOLOGY MAPPING AND ABLATION  2001,2009   Dr. Tally Due  . CARDIOVASCULAR STRESS TEST  09/08/2005   R/S MV - EF 61%; Exercises capacity 12 Mets; essentially normal perfusion myocardial scan    . CARDIOVERSION  10/18/2007   successful  . CARDIOVERSION N/A 02/08/2013   Procedure: CARDIOVERSION;  Surgeon: Troy Sine, MD;  Location: [place];  Service: Cardiovascular;  Laterality: N/A;  . CARDIOVERSION N/A 08/17/2013   Procedure: CARDIOVERSION;  Surgeon: Sanda Klein, MD;  Location: [place];  Service: Cardiovascular;  Laterality: N/A;  . CARDIOVERSION N/A 12/28/2013   Procedure: CARDIOVERSION;  Surgeon: Fay Records, MD;  Location: [place];  Service: Cardiovascular;  Laterality: N/A;  . CARDIOVERSION N/A 01/19/2014   Procedure: CARDIOVERSION;  Surgeon: Sanda Klein, MD;  Location: [facility] ENDOSCOPY;  Service: Cardiovascular;  Laterality: N/A;  . CARDIOVERSION N/A 03/11/2014   Procedure: CARDIOVERSION;  Surgeon: Evans Lance, MD;  Location: [place];  Service: Cardiovascular;  Laterality: N/A;  . CARDIOVERSION N/A 08/23/2014   Procedure: CARDIOVERSION;  Surgeon: Lelon Perla, MD;  Location: [facility] ENDOSCOPY;  Service: Cardiovascular;  Laterality: N/A;  . CARDIOVERSION N/A 10/04/2014   Procedure: CARDIOVERSION;  Surgeon: Sueanne Margarita, MD;  Location: [facility] ENDOSCOPY;  Service: Cardiovascular;  Laterality: N/A;  . CARDIOVERSION N/A 05/22/2015   Procedure: CARDIOVERSION;  Surgeon: Thayer Headings, MD;  Location: [facility] ENDOSCOPY;  Service: Cardiovascular;  Laterality: N/A;  . CARPAL TUNNEL RELEASE Left April 2015  . ELBOW / UPPER ARM FOREIGN BODY REMOVAL    . ELECTROPHYSIOLOGIC STUDY N/A 09/25/2014   Atrial fibrillation ablation performed by Dr [name] [name]  . FLEXIBLE SIGMOIDOSCOPY N/A 08/28/2015   Procedure: FLEXIBLE SIGMOIDOSCOPY;  Surgeon: [name], MD;  Location: [facility] ENDOSCOPY;  Service: Endoscopy;  Laterality: N/A;  . FRACTURE SURGERY Left     Left wrist  . HOT HEMOSTASIS N/A 08/28/2015   Procedure: HOT HEMOSTASIS (ARGON PLASMA COAGULATION/BICAP);  Surgeon: [name], MD;  Location: [facility] ENDOSCOPY;  Service: Endoscopy;  Laterality: N/A;  . LEFT AND RIGHT HEART CATHETERIZATION WITH CORONARY ANGIOGRAM N/A 03/17/2013   Procedure: LEFT AND RIGHT HEART CATHETERIZATION WITH CORONARY ANGIOGRAM;  Surgeon: Troy Sine, MD;  Location: [facility] CATH LAB;  Service: Cardiovascular;  Laterality: N/A;  . osteosynthesis Left 09/30/2012   with small plate in the ulna  . SUBXYPHOID PERICARDIAL WINDOW N/A 04/25/2013   Procedure: SUBXYPHOID PERICARDIAL WINDOW;  Surgeon: Gaye Pollack, MD;  Location: [facility] OR;  Service: Thoracic;  Laterality: N/A;  . TEE WITHOUT CARDIOVERSION N/A 09/24/2014   Procedure: TRANSESOPHAGEAL ECHOCARDIOGRAM (TEE);  Surgeon: Dorothy Spark, MD;  Location: [facility] ENDOSCOPY;  Service: Cardiovascular;  Laterality: N/A;  . TRANSESOPHAGEAL ECHOCARDIOGRAM  08/26/2010   see Notes tab     Current Outpatient Prescriptions  Medication Sig Dispense Refill  . atorvastatin (LIPITOR) 40 MG tablet TAKE 1 TABLET BY MOUTH EVERY EVENING 90 tablet 1  . Cholecalciferol (VITAMIN D) 2000 UNITS CAPS Take 2,000 Units by mouth daily.    Marland Kitchen diltiazem (CARDIZEM CD) 180 MG 24 hr capsule TAKE 1 CAPSULE (180 MG TOTAL) BY MOUTH DAILY. 90 capsule 2  . finasteride (PROPECIA) 1 MG tablet Take 1 mg by mouth every morning.     . furosemide (LASIX) 40 MG tablet Take 1 tablet (40 mg total) by mouth daily. 90 tablet 3  . metoprolol succinate (TOPROL-XL) 25 MG 24 hr tablet Take 0.5 tablets (12.5 mg total) by mouth daily. Take with or immediately following a meal. 45 tablet 2  . pantoprazole (PROTONIX) 40 MG tablet Take 1 tablet (40 mg total) by mouth daily. 90 tablet 3  . potassium chloride SA (K-DUR,KLOR-CON) 20 MEQ tablet Take 1.5 tablets (30 mEq total) by mouth daily. 45 tablet 9  . rivaroxaban (XARELTO) 20 MG TABS tablet Take 1 tablet (20 mg total) by mouth daily  with supper. 30 tablet 11  . Saw Palmetto, Serenoa repens, (SAW PALMETTO PO) Take 900 mg by mouth 2 (two) times daily.     Marland Kitchen TIKOSYN 500 MCG capsule Take 1 capsule (500 mcg total) by mouth 2 (two) times daily. 180 capsule 2   No current facility-administered medications for this visit.     Allergies:   Tetracyclines & related   Social History:  The patient  reports that he quit smoking about 27 years ago. He has a 10.00 pack-year smoking history. He has never used smokeless tobacco. He reports that he does not drink alcohol or use drugs.   Family History:  The patient's  family history includes Atrial fibrillation in his brother; Coronary artery disease in his father; Heart attack in his father; Mitral valve prolapse in his sister; Stroke in his mother.    ROS:  Please see the history of present illness.   All other systems are reviewed and negative.    PHYSICAL EXAM: VS:  BP 107/64   Pulse 71  Ht 6\' 2"  (1.88 m)   Wt 174 lb 3.2 oz (79 kg)   BMI 22.37 kg/m  , BMI Body mass index is 22.37 kg/m. GEN: Well nourished, well developed, in no acute distress  HEENT: normal  Neck: no JVD, carotid bruits, or masses Cardiac: RRR; no murmurs, rubs, or gallops,no edema  Respiratory:  clear to auscultation bilaterally, normal work of breathing GI: soft, nontender, nondistended, + BS MS: no deformity or atrophy  Skin: warm and dry  Neuro:  Strength and sensation are intact Psych: euthymic mood, full affect  EKG:  EKG is ordered today. The ekg ordered today shows sinus rhythm 71 bpm with PVCs, PR 202 msec, septal infarct, Qtc 439 msec  Recent Labs: 09/30/2015: BUN 19; Creat 0.87; Hemoglobin 12.3; Magnesium 1.8; Platelets 240; Potassium 4.0; Sodium 141    Lipid Panel     Component Value Date/Time   CHOL  08/23/2007 0910    188        ATP III CLASSIFICATION:  <200     mg/dL   Desirable  200-239  mg/dL   Borderline High  >=240    mg/dL   High   TRIG 63 08/23/2007 0910   HDL 47  08/23/2007 0910   CHOLHDL 4.0 08/23/2007 0910   VLDL 13 08/23/2007 0910   LDLCALC (H) 08/23/2007 0910    128        Total Cholesterol/HDL:CHD Risk Coronary Heart Disease Risk Table                     Men   Women  1/2 Average Risk   3.4   3.3     Wt Readings from Last 3 Encounters:  03/25/16 174 lb 3.2 oz (79 kg)  09/30/15 181 lb (82.1 kg)  08/28/15 187 lb (84.8 kg)     ASSESSMENT AND PLAN:  1. Persistent afib Doing great at this time No changes today On xarelto Check bmet, mg today  2. OSA Compliance with CPAP is encouraged.    3. HTN Stable  4. CAD Cardiac CT previously abnormal, however myoview is low risk Continue medical therapy  Current medicines are reviewed at length with the patient today.   The patient does not have concerns regarding his medicines.  The following changes were made today:  none  Return to see me in 6 months   Signed, [name] Grayer, MD  03/25/2016 4:18 PM     [place] [address] [phone] (office) [phone] (fax)

## 2016-03-26 LAB — BASIC METABOLIC PANEL
BUN: 14 mg/dL (ref 7–25)
CHLORIDE: 99 mmol/L (ref 98–110)
CO2: 23 mmol/L (ref 20–31)
CREATININE: 0.89 mg/dL (ref 0.70–1.25)
Calcium: 9.5 mg/dL (ref 8.6–10.3)
GLUCOSE: 90 mg/dL (ref 65–99)
POTASSIUM: 3.9 mmol/L (ref 3.5–5.3)
Sodium: 140 mmol/L (ref 135–146)

## 2016-03-26 LAB — MAGNESIUM: MAGNESIUM: 2 mg/dL (ref 1.5–2.5)

## 2016-05-14 ENCOUNTER — Other Ambulatory Visit: Payer: Self-pay | Admitting: Internal Medicine

## 2016-05-14 DIAGNOSIS — I4819 Other persistent atrial fibrillation: Secondary | ICD-10-CM

## 2016-06-17 ENCOUNTER — Other Ambulatory Visit: Payer: Self-pay | Admitting: Internal Medicine

## 2016-06-19 ENCOUNTER — Other Ambulatory Visit: Payer: Self-pay | Admitting: *Deleted

## 2016-06-19 MED ORDER — RIVAROXABAN 20 MG PO TABS: 20.0000 mg | ORAL_TABLET | Freq: Every day | ORAL | 1 refills | Status: DC

## 2016-06-19 NOTE — Telephone Encounter (Signed)
Patient requests ninety days. 

## 2016-06-19 NOTE — Telephone Encounter (Signed)
Age 71 years Saw Dr Rayann Heman on 03/25/2016  Wt 79 kg  03/25/2016 SrCr 0.89 09/30/2015 Hgb 12.3 HCT 37.0  CrCl 87.53 Refill done for Xarelto 20 mg daily

## 2016-08-11 ENCOUNTER — Ambulatory Visit (HOSPITAL_COMMUNITY)
Admission: EM | Admit: 2016-08-11 | Discharge: 2016-08-11 | Disposition: A | Discharge status: Home / Self Care | Payer: Medicare Other | Attending: Internal Medicine | Admitting: Internal Medicine

## 2016-08-11 ENCOUNTER — Encounter (HOSPITAL_COMMUNITY): Payer: Self-pay | Admitting: Emergency Medicine

## 2016-08-11 ENCOUNTER — Ambulatory Visit (INDEPENDENT_AMBULATORY_CARE_PROVIDER_SITE_OTHER): Payer: Medicare Other

## 2016-08-11 DIAGNOSIS — M79644 Pain in right finger(s): Secondary | ICD-10-CM | POA: Diagnosis not present

## 2016-08-11 DIAGNOSIS — L089 Local infection of the skin and subcutaneous tissue, unspecified: Secondary | ICD-10-CM

## 2016-08-11 MED ORDER — CEPHALEXIN 500 MG PO CAPS: 500.0000 mg | ORAL_CAPSULE | Freq: Four times a day (QID) | ORAL | 0 refills | Status: DC

## 2016-08-11 MED ORDER — SULFAMETHOXAZOLE-TRIMETHOPRIM 800-160 MG PO TABS: 1.0000 | ORAL_TABLET | Freq: Two times a day (BID) | ORAL | 0 refills | Status: AC

## 2016-08-11 MED ORDER — SULFAMETHOXAZOLE-TRIMETHOPRIM 800-160 MG PO TABS: 1.0000 | ORAL_TABLET | Freq: Two times a day (BID) | ORAL | Status: DC

## 2016-08-11 MED ORDER — CEPHALEXIN 500 MG PO CAPS: 500.0000 mg | ORAL_CAPSULE | Freq: Once | ORAL | Status: DC

## 2016-08-11 NOTE — ED Triage Notes (Signed)
The patient presented to the [facility] with a complaint of a possible splinter in his 2nd finger of the right hand. The patient stated that the splinter could have been I there for 36 hours.

## 2016-08-11 NOTE — ED Provider Notes (Signed)
CSN: [id number]     Arrival date & time 08/11/16  1851 History   None    Chief Complaint  Patient presents with  . Foreign Body in Skin   (Consider location/radiation/quality/duration/timing/severity/associated sxs/prior Treatment) The history is provided by the patient. No language interpreter was used.  Hand Pain  This is a new problem. The current episode started 2 days ago. The problem occurs constantly. The problem has not changed since onset.Nothing aggravates the symptoms. Nothing relieves the symptoms. He has tried nothing for the symptoms. The treatment provided no relief.  Pt complains of swelling to his finger.  Pt reports swelling began 2 days ago.  Pt did some wood work and garden work,   Past Medical History:  Diagnosis Date  . Cardiomyopathy ([place])    a. 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 b. also hx of tachycardia mediated CM, now resolved  . Complication of anesthesia    Pt has anxiety issues and thinks he is going to die.  . Depression   . Echocardiogram abnormal    EF60%, mild LVH, PA pk pressure 35, pericardial effusion mild increase from 2012   . H/O cardiac catheterization    no significant CAD by cath 5/12  . Hyperlipidemia   . Hypertension   . Limb pain 06/20/2007   LE venous doppler - no evidence of thrombus or thrombophlebitis  . Liver mass    a. CT 03/2013: Small probable cysts in the liver and small hyperenhancing liver mass.  . OSA (obstructive sleep apnea)    AHI avg 5/hr  . Paroxysmal atrial fibrillation (HCC)    a. s/p afib ablations 2002 and 2009 by Dr Rolland Porter. b. s/p DCCV 02/2013. c. s/p DCCV 01/19/2014  . Pericardial effusion    a. Since 2007. b. s/p pericardial window 04/2013.  Marland Kitchen PVC (premature ventricular contraction) 12/18/2009   cardionet monitor 21 days - some PVCs, ventricular bigeminy  . Thoracic aortic aneurysm ([place])    a. Borderline enlarged by CT 12/14 at 4.0cm.   Past Surgical History:  Procedure Laterality Date  .  CARDIAC CATHETERIZATION  08/25/10   NO SIGNIFICANT CAD  . CARDIAC ELECTROPHYSIOLOGY MAPPING AND ABLATION  2001,2009   Dr. Tally Due  . CARDIOVASCULAR STRESS TEST  09/08/2005   R/S MV - EF 61%; Exercises capacity 12 Mets; essentially normal perfusion myocardial scan    . CARDIOVERSION  10/18/2007   successful  . CARDIOVERSION N/A 02/08/2013   Procedure: CARDIOVERSION;  Surgeon: Troy Sine, MD;  Location: [place];  Service: Cardiovascular;  Laterality: N/A;  . CARDIOVERSION N/A 08/17/2013   Procedure: CARDIOVERSION;  Surgeon: Sanda Klein, MD;  Location: [place];  Service: Cardiovascular;  Laterality: N/A;  . CARDIOVERSION N/A 12/28/2013   Procedure: CARDIOVERSION;  Surgeon: Fay Records, MD;  Location: [place];  Service: Cardiovascular;  Laterality: N/A;  . CARDIOVERSION N/A 01/19/2014   Procedure: CARDIOVERSION;  Surgeon: Sanda Klein, MD;  Location: [facility] ENDOSCOPY;  Service: Cardiovascular;  Laterality: N/A;  . CARDIOVERSION N/A 03/11/2014   Procedure: CARDIOVERSION;  Surgeon: Evans Lance, MD;  Location: [place];  Service: Cardiovascular;  Laterality: N/A;  . CARDIOVERSION N/A 08/23/2014   Procedure: CARDIOVERSION;  Surgeon: Lelon Perla, MD;  Location: [facility] ENDOSCOPY;  Service: Cardiovascular;  Laterality: N/A;  . CARDIOVERSION N/A 10/04/2014   Procedure: CARDIOVERSION;  Surgeon: Sueanne Margarita, MD;  Location: [facility] ENDOSCOPY;  Service: Cardiovascular;  Laterality: N/A;  . CARDIOVERSION N/A 05/22/2015   Procedure: CARDIOVERSION;  Surgeon: Thayer Headings, MD;  Location: [place];  Service: Cardiovascular;  Laterality: N/A;  . CARPAL TUNNEL RELEASE Left April 2015  . ELBOW / UPPER ARM FOREIGN BODY REMOVAL    . ELECTROPHYSIOLOGIC STUDY N/A 09/25/2014   Atrial fibrillation ablation performed by Dr Rayann Heman  . FLEXIBLE SIGMOIDOSCOPY N/A 08/28/2015   Procedure: FLEXIBLE SIGMOIDOSCOPY;  Surgeon: [name], MD;  Location: [facility] ENDOSCOPY;  Service: Endoscopy;  Laterality:  N/A;  . FRACTURE SURGERY Left    Left wrist  . HOT HEMOSTASIS N/A 08/28/2015   Procedure: HOT HEMOSTASIS (ARGON PLASMA COAGULATION/BICAP);  Surgeon: [name], MD;  Location: [facility] ENDOSCOPY;  Service: Endoscopy;  Laterality: N/A;  . LEFT AND RIGHT HEART CATHETERIZATION WITH CORONARY ANGIOGRAM N/A 03/17/2013   Procedure: LEFT AND RIGHT HEART CATHETERIZATION WITH CORONARY ANGIOGRAM;  Surgeon: Troy Sine, MD;  Location: [facility] CATH LAB;  Service: Cardiovascular;  Laterality: N/A;  . osteosynthesis Left 09/30/2012   with small plate in the ulna  . SUBXYPHOID PERICARDIAL WINDOW N/A 04/25/2013   Procedure: SUBXYPHOID PERICARDIAL WINDOW;  Surgeon: Gaye Pollack, MD;  Location: [facility] OR;  Service: Thoracic;  Laterality: N/A;  . TEE WITHOUT CARDIOVERSION N/A 09/24/2014   Procedure: TRANSESOPHAGEAL ECHOCARDIOGRAM (TEE);  Surgeon: Dorothy Spark, MD;  Location: [facility] ENDOSCOPY;  Service: Cardiovascular;  Laterality: N/A;  . TRANSESOPHAGEAL ECHOCARDIOGRAM  08/26/2010   see Notes tab   Family History  Problem Relation Age of Onset  . Coronary artery disease Father   . Heart attack Father   . Stroke Mother     died  . Atrial fibrillation Brother   . Mitral valve prolapse Sister    Social History  Substance Use Topics  . Smoking status: Former Smoker    Packs/day: 1.00    Years: 10.00    Quit date: 02/06/1989  . Smokeless tobacco: Never Used  . Alcohol use No     Comment: 2 bottles of wine per week, pt states has not drank alcohol since christmas    Review of Systems  All other systems reviewed and are negative.   Allergies  Tetracyclines & related  Home Medications   Prior to Admission medications   Medication Sig Start Date End Date Taking? Authorizing Provider  atorvastatin (LIPITOR) 40 MG tablet TAKE 1 TABLET BY MOUTH EVERY EVENING 03/04/16   Allred, Jeneen Rinks, MD  cephALEXin (KEFLEX) 500 MG capsule Take 1 capsule (500 mg total) by mouth 4 (four) times daily. 08/11/16   Fransico Meadow,  PA-C  Cholecalciferol (VITAMIN D) 2000 UNITS CAPS Take 2,000 Units by mouth daily.    [provider]  diltiazem (CARDIZEM CD) 180 MG 24 hr capsule TAKE 1 CAPSULE (180 MG TOTAL) BY MOUTH DAILY. 12/23/15   Allred, Jeneen Rinks, MD  finasteride (PROPECIA) 1 MG tablet Take 1 mg by mouth every morning.     [provider]  furosemide (LASIX) 40 MG tablet Take 1 tablet (40 mg total) by mouth daily. 05/14/16   Allred, Jeneen Rinks, MD  pantoprazole (PROTONIX) 40 MG tablet Take 1 tablet (40 mg total) by mouth daily. 10/01/15   Troy Sine, MD  potassium chloride SA (K-DUR,KLOR-CON) 20 MEQ tablet Take 1.5 tablets (30 mEq total) by mouth daily. 10/01/14   Allred, Jeneen Rinks, MD  rivaroxaban (XARELTO) 20 MG TABS tablet Take 1 tablet (20 mg total) by mouth daily with supper. 06/19/16   Allred, Jeneen Rinks, MD  Saw Palmetto, Serenoa repens, (SAW PALMETTO PO) Take 900 mg by mouth 2 (two) times daily.  [provider]  sulfamethoxazole-trimethoprim (BACTRIM DS,SEPTRA DS) 800-160 MG tablet Take 1 tablet by mouth 2 (two) times daily. 08/11/16 08/18/16  Fransico Meadow, PA-C  TIKOSYN 500 MCG capsule Take 1 capsule (500 mcg total) by mouth 2 (two) times daily. 03/09/16   AllredJeneen Rinks, MD   Meds Ordered and Administered this Visit  Medications - No data to display  BP 125/85 (BP Location: Left Arm)   Pulse 80   Temp 98.4 F (36.9 C) (Oral)   Resp 16   SpO2 100%  No data found.   Physical Exam  Constitutional: He appears well-developed and well-nourished.  Musculoskeletal: He exhibits tenderness.  Swollen tender left index finger to pip,  From  nv and ns intact   Neurological: He is alert.  Psychiatric: He has a normal mood and affect.  Nursing note and vitals reviewed.   Urgent Care Course     Procedures (including critical care time)  Labs Review Labs Reviewed - No data to display  Imaging Review Dg Finger Index Right  Result Date: 08/11/2016 CLINICAL DATA:  Right index finger pain and  swelling since yesterday. No known injury pea EXAM: RIGHT INDEX FINGER 2+V COMPARISON:  None. FINDINGS: Distal right index finger soft tissue swelling. Normal appearing bones. No soft tissue gas or radiopaque foreign body. IMPRESSION: Distal soft tissue swelling without underlying bony abnormality. Electronically Signed   By: Claudie Revering M.D.   On: 08/11/2016 20:31     Visual Acuity Review  Right Eye Distance:   Left Eye Distance:   Bilateral Distance:    Right Eye Near:   Left Eye Near:    Bilateral Near:         MDM no foreign body on xray.  Pt has seen Dr. Caralyn Guile in the past. I spoke with Dr. Lyla Glassing.  Pt advised to call office in am.     1. Finger infection     Scheduled Meds: Continuous Infusions: PRN Meds:. An After Visit Summary was printed and given to the patient.   Meds ordered this encounter  Medications  . DISCONTD: cephALEXin (KEFLEX) capsule 500 mg  . DISCONTD: sulfamethoxazole-trimethoprim (BACTRIM DS,SEPTRA DS) 800-160 MG per tablet 1 tablet  . cephALEXin (KEFLEX) 500 MG capsule    Sig: Take 1 capsule (500 mg total) by mouth 4 (four) times daily.    Dispense:  40 capsule    Refill:  0    Order Specific Question:   Supervising Provider    Answer:   Sherlene Shams [[id number]]  . sulfamethoxazole-trimethoprim (BACTRIM DS,SEPTRA DS) 800-160 MG tablet    Sig: Take 1 tablet by mouth 2 (two) times daily.    Dispense:  14 tablet    Refill:  0    Order Specific Question:   Supervising Provider    Answer:   Sherlene Shams [[id number]]   Fransico Meadow, PA-C 08/11/16 2138    Fransico Meadow, [place] 08/11/16 2140

## 2016-08-26 ENCOUNTER — Other Ambulatory Visit: Payer: Self-pay | Admitting: Internal Medicine

## 2016-08-31 ENCOUNTER — Other Ambulatory Visit: Payer: Self-pay | Admitting: Internal Medicine

## 2016-09-02 ENCOUNTER — Other Ambulatory Visit: Payer: Self-pay | Admitting: *Deleted

## 2016-09-02 MED ORDER — POTASSIUM CHLORIDE CRYS ER 20 MEQ PO TBCR: 30.0000 meq | EXTENDED_RELEASE_TABLET | Freq: Every day | ORAL | 1 refills | Status: DC

## 2016-09-02 NOTE — Telephone Encounter (Signed)
Medication Detail    Disp Refills Start End   potassium chloride SA (K-DUR,KLOR-CON) 20 MEQ tablet 135 tablet 1 09/02/2016    Sig - Route: Take 1.5 tablets (30 mEq total) by mouth daily. - Oral   E-Prescribing Status: Receipt confirmed by pharmacy (09/02/2016 12:39 PM EDT)   Pharmacy   [id number] [address], [place]

## 2016-09-07 ENCOUNTER — Encounter: Payer: Self-pay | Admitting: Internal Medicine

## 2016-09-07 ENCOUNTER — Ambulatory Visit (INDEPENDENT_AMBULATORY_CARE_PROVIDER_SITE_OTHER): Payer: Medicare Other | Admitting: Internal Medicine

## 2016-09-07 VITALS — BP 99/59 | HR 81 | Ht 72.75 in | Wt 169.4 lb

## 2016-09-07 DIAGNOSIS — I10 Essential (primary) hypertension: Secondary | ICD-10-CM | POA: Diagnosis not present

## 2016-09-07 DIAGNOSIS — I4819 Other persistent atrial fibrillation: Secondary | ICD-10-CM

## 2016-09-07 DIAGNOSIS — I481 Persistent atrial fibrillation: Secondary | ICD-10-CM | POA: Diagnosis not present

## 2016-09-07 DIAGNOSIS — Q2549 Other congenital malformations of aorta: Secondary | ICD-10-CM

## 2016-09-07 MED ORDER — POTASSIUM CHLORIDE CRYS ER 20 MEQ PO TBCR: 20.0000 meq | EXTENDED_RELEASE_TABLET | Freq: Every day | ORAL | 1 refills | Status: DC

## 2016-09-07 MED ORDER — FUROSEMIDE 40 MG PO TABS: ORAL_TABLET | ORAL | 1 refills | Status: DC

## 2016-09-07 MED ORDER — PANTOPRAZOLE SODIUM 40 MG PO TBEC: 40.0000 mg | DELAYED_RELEASE_TABLET | Freq: Every day | ORAL | 3 refills | Status: DC | PRN

## 2016-09-07 NOTE — Patient Instructions (Addendum)
Medication Instructions:  Your physician has recommended you make the following change in your medication:  1) Only take Protonix 40 mg daily as needed 2) Decrease Potassium to 20 mEq once daily 3) Decrease Lasix to 20 mg (1/2 tablet) once daily   Labwork: None ordered   Testing/Procedures: Your physician has requested that you have an echocardiogram. Echocardiography is a painless test that uses sound waves to create images of your heart. It provides your doctor with information about the size and shape of your heart and how well your heart's chambers and valves are working. This procedure takes approximately one hour. There are no restrictions for this procedure.    Follow-Up: Your physician wants you to follow-up in: 6 months with Dr. Rayann Heman. You will receive a reminder letter in the mail two months in advance. If you don't receive a letter, please call our office to schedule the follow-up appointment.   Any Other Special Instructions Will Be Listed Below (If Applicable).     If you need a refill on your cardiac medications before your next appointment, please call your pharmacy.

## 2016-09-07 NOTE — Progress Notes (Signed)
PCP: [name], MD  [name] [name] is a 72 y.o. male who presents today for routine electrophysiology followup.  Since last being seen in our clinic, the patient reports doing very well.  He remains active.  No afib since his last visit.  Today, he denies symptoms of palpitations, chest pain, shortness of breath,  lower extremity edema, dizziness, presyncope, or syncope.  The patient is otherwise without complaint today.   Past Medical History:  Diagnosis Date  . Cardiomyopathy ([place])    a. 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 b. also hx of tachycardia mediated CM, now resolved  . Complication of anesthesia    Pt has anxiety issues and thinks he is going to die.  . Depression   . Echocardiogram abnormal    EF60%, mild LVH, PA pk pressure 35, pericardial effusion mild increase from 2012   . H/O cardiac catheterization    no significant CAD by cath 5/12  . Hyperlipidemia   . Hypertension   . Limb pain 06/20/2007   LE venous doppler - no evidence of thrombus or thrombophlebitis  . Liver mass    a. CT 03/2013: Small probable cysts in the liver and small hyperenhancing liver mass.  . OSA (obstructive sleep apnea)    AHI avg 5/hr  . Paroxysmal atrial fibrillation (HCC)    a. s/p afib ablations 2002 and 2009 by Dr Rolland Porter. b. s/p DCCV 02/2013. c. s/p DCCV 01/19/2014  . Pericardial effusion    a. Since 2007. b. s/p pericardial window 04/2013.  Marland Kitchen PVC (premature ventricular contraction) 12/18/2009   cardionet monitor 21 days - some PVCs, ventricular bigeminy  . Thoracic aortic aneurysm ([place])    a. Borderline enlarged by CT 12/14 at 4.0cm.   Past Surgical History:  Procedure Laterality Date  . CARDIAC CATHETERIZATION  08/25/10   NO SIGNIFICANT CAD  . CARDIAC ELECTROPHYSIOLOGY MAPPING AND ABLATION  2001,2009   Dr. Tally Due  . CARDIOVASCULAR STRESS TEST  09/08/2005   R/S MV - EF 61%; Exercises capacity 12 Mets; essentially normal perfusion myocardial scan      . CARDIOVERSION  10/18/2007   successful  . CARDIOVERSION N/A 02/08/2013   Procedure: CARDIOVERSION;  Surgeon: Troy Sine, MD;  Location: [place];  Service: Cardiovascular;  Laterality: N/A;  . CARDIOVERSION N/A 08/17/2013   Procedure: CARDIOVERSION;  Surgeon: Sanda Klein, MD;  Location: [place];  Service: Cardiovascular;  Laterality: N/A;  . CARDIOVERSION N/A 12/28/2013   Procedure: CARDIOVERSION;  Surgeon: Fay Records, MD;  Location: [place];  Service: Cardiovascular;  Laterality: N/A;  . CARDIOVERSION N/A 01/19/2014   Procedure: CARDIOVERSION;  Surgeon: Sanda Klein, MD;  Location: [facility] ENDOSCOPY;  Service: Cardiovascular;  Laterality: N/A;  . CARDIOVERSION N/A 03/11/2014   Procedure: CARDIOVERSION;  Surgeon: Evans Lance, MD;  Location: [place];  Service: Cardiovascular;  Laterality: N/A;  . CARDIOVERSION N/A 08/23/2014   Procedure: CARDIOVERSION;  Surgeon: Lelon Perla, MD;  Location: [facility] ENDOSCOPY;  Service: Cardiovascular;  Laterality: N/A;  . CARDIOVERSION N/A 10/04/2014   Procedure: CARDIOVERSION;  Surgeon: Sueanne Margarita, MD;  Location: [facility] ENDOSCOPY;  Service: Cardiovascular;  Laterality: N/A;  . CARDIOVERSION N/A 05/22/2015   Procedure: CARDIOVERSION;  Surgeon: Thayer Headings, MD;  Location: [place];  Service: Cardiovascular;  Laterality: N/A;  . CARPAL TUNNEL RELEASE Left April 2015  . ELBOW / UPPER ARM FOREIGN BODY REMOVAL    . ELECTROPHYSIOLOGIC STUDY N/A 09/25/2014   Atrial fibrillation ablation  performed by Dr Rayann Heman  . FLEXIBLE SIGMOIDOSCOPY N/A 08/28/2015   Procedure: FLEXIBLE SIGMOIDOSCOPY;  Surgeon: [name], MD;  Location: [facility] ENDOSCOPY;  Service: Endoscopy;  Laterality: N/A;  . FRACTURE SURGERY Left    Left wrist  . HOT HEMOSTASIS N/A 08/28/2015   Procedure: HOT HEMOSTASIS (ARGON PLASMA COAGULATION/BICAP);  Surgeon: [name], MD;  Location: [facility] ENDOSCOPY;  Service: Endoscopy;  Laterality: N/A;  . LEFT AND RIGHT HEART  CATHETERIZATION WITH CORONARY ANGIOGRAM N/A 03/17/2013   Procedure: LEFT AND RIGHT HEART CATHETERIZATION WITH CORONARY ANGIOGRAM;  Surgeon: Troy Sine, MD;  Location[facility] CATH LAB;  Service: Cardiovascular;  Laterality: N/A;  . osteosynthesis Left 09/30/2012   with small plate in the ulna  . SUBXYPHOID PERICARDIAL WINDOW N/A 04/25/2013   Procedure: SUBXYPHOID PERICARDIAL WINDOW;  Surgeon: Gaye Pollack, MD;  Location: [facility] OR;  Service: Thoracic;  Laterality: N/A;  . TEE WITHOUT CARDIOVERSION N/A 09/24/2014   Procedure: TRANSESOPHAGEAL ECHOCARDIOGRAM (TEE);  Surgeon: Dorothy Spark, MD;  Location: [facility] ENDOSCOPY;  Service: Cardiovascular;  Laterality: N/A;  . TRANSESOPHAGEAL ECHOCARDIOGRAM  08/26/2010   see Notes tab    ROS- all systems are reviewed and negatives except as per HPI above  Current Outpatient Prescriptions  Medication Sig Dispense Refill  . atorvastatin (LIPITOR) 40 MG tablet TAKE 1 TABLET BY MOUTH EVERY EVENING 90 tablet 3  . Cholecalciferol (VITAMIN D) 2000 UNITS CAPS Take 2,000 Units by mouth daily.    Marland Kitchen diltiazem (CARDIZEM CD) 180 MG 24 hr capsule TAKE 1 CAPSULE (180 MG TOTAL) BY MOUTH DAILY. 90 capsule 2  . finasteride (PROPECIA) 1 MG tablet Take 1 mg by mouth every morning.     . furosemide (LASIX) 40 MG tablet Take 1 tablet (40 mg total) by mouth daily. 90 tablet 1  . pantoprazole (PROTONIX) 40 MG tablet Take 1 tablet (40 mg total) by mouth daily. 90 tablet 3  . potassium chloride SA (K-DUR,KLOR-CON) 20 MEQ tablet Take 1.5 tablets (30 mEq total) by mouth daily. 135 tablet 1  . rivaroxaban (XARELTO) 20 MG TABS tablet Take 1 tablet (20 mg total) by mouth daily with supper. 90 tablet 1  . Saw Palmetto, Serenoa repens, (SAW PALMETTO PO) Take 900 mg by mouth 2 (two) times daily.     Marland Kitchen TIKOSYN 500 MCG capsule Take 1 capsule (500 mcg total) by mouth 2 (two) times daily. 180 capsule 2   No current facility-administered medications for this visit.     Physical Exam: Vitals:     09/07/16 1509  BP: (!) 99/59  Pulse: 81  Weight: 169 lb 6.4 oz (76.8 kg)  Height: 6' 0.75" (1.848 m)    GEN- The patient is well appearing, alert and oriented x 3 today.   Head- normocephalic, atraumatic Eyes-  Sclera clear, conjunctiva pink Ears- hearing intact Oropharynx- clear Lungs- Clear to ausculation bilaterally, normal work of breathing Heart- Regular rate and rhythm, no murmurs, rubs or gallops, PMI not laterally displaced GI- soft, NT, ND, + BS Extremities- no clubbing, cyanosis, or edema  EKG tracing ordered today is personally reviewed and shows sinus rhythm 81 bpm, PR 202 msec, Qtc 439 msec,PVCs,  poor R wave progression, otherwise normal  Labs from PCP office are reviewed and scanned into epic today  Assessment and Plan:  1. Persistent afib Maintaining sinus rhythm No changes today Labs from Dr Zadie Rhine are reviewed today On xarelto Repeat echo to evaluate for structural changes  2. OSA Compliance with CPAP encouraged  3. HTN Stable  No change required today Reduce lasix to 20mg  daily Reduce K to 20 meq daily  4. CAD Asymptomatic Conservative management  5. Prior sinus of valsalva enlargement Repeat echo  Return to see me in 6 months  Thompson Grayer MD, [facility] 09/07/2016 3:35 PM

## 2016-09-11 ENCOUNTER — Other Ambulatory Visit: Payer: Self-pay | Admitting: Cardiovascular Disease

## 2016-09-11 ENCOUNTER — Other Ambulatory Visit: Payer: Self-pay | Admitting: Internal Medicine

## 2016-09-22 ENCOUNTER — Other Ambulatory Visit: Payer: Self-pay

## 2016-09-22 ENCOUNTER — Ambulatory Visit (HOSPITAL_COMMUNITY): Payer: Medicare Other | Attending: Cardiovascular Disease

## 2016-09-22 DIAGNOSIS — Q2549 Other congenital malformations of aorta: Secondary | ICD-10-CM | POA: Diagnosis not present

## 2016-09-22 DIAGNOSIS — I069 Rheumatic aortic valve disease, unspecified: Secondary | ICD-10-CM | POA: Insufficient documentation

## 2016-11-07 ENCOUNTER — Other Ambulatory Visit: Payer: Self-pay | Admitting: Internal Medicine

## 2016-11-07 DIAGNOSIS — I4819 Other persistent atrial fibrillation: Secondary | ICD-10-CM

## 2016-12-17 ENCOUNTER — Encounter: Payer: Self-pay | Admitting: *Deleted

## 2016-12-17 ENCOUNTER — Other Ambulatory Visit: Payer: Self-pay | Admitting: Internal Medicine

## 2017-02-24 ENCOUNTER — Other Ambulatory Visit: Payer: Self-pay | Admitting: Internal Medicine

## 2017-03-12 ENCOUNTER — Other Ambulatory Visit: Payer: Self-pay | Admitting: *Deleted

## 2017-03-12 ENCOUNTER — Telehealth: Payer: Self-pay | Admitting: Internal Medicine

## 2017-03-12 MED ORDER — TIKOSYN 500 MCG PO CAPS: 500.0000 ug | ORAL_CAPSULE | Freq: Two times a day (BID) | ORAL | 0 refills | Status: DC

## 2017-03-12 NOTE — Telephone Encounter (Signed)
Per epic patient should have a ninety day refill to pick up this month at the pharmacy. I called [facility] and spoke with the pharmacist and was informed that the patient picked up a 19 day rx yesterday. I will send in one more ninety day rx for them to put on file.

## 2017-03-12 NOTE — Telephone Encounter (Signed)
Called pt to cxl Monday appt due to inclement weather, pt requesting refill of Tikosyn @ [facility]

## 2017-03-15 ENCOUNTER — Ambulatory Visit: Payer: Medicare Other | Admitting: Internal Medicine

## 2017-04-12 ENCOUNTER — Ambulatory Visit: Payer: Medicare Other | Admitting: Internal Medicine

## 2017-04-12 ENCOUNTER — Encounter: Payer: Self-pay | Admitting: Internal Medicine

## 2017-04-12 VITALS — BP 118/84 | HR 69 | Ht 74.0 in | Wt 170.4 lb

## 2017-04-12 DIAGNOSIS — Z79899 Other long term (current) drug therapy: Secondary | ICD-10-CM | POA: Diagnosis not present

## 2017-04-12 DIAGNOSIS — I481 Persistent atrial fibrillation: Secondary | ICD-10-CM

## 2017-04-12 DIAGNOSIS — I4819 Other persistent atrial fibrillation: Secondary | ICD-10-CM

## 2017-04-12 DIAGNOSIS — I10 Essential (primary) hypertension: Secondary | ICD-10-CM | POA: Diagnosis not present

## 2017-04-12 DIAGNOSIS — Q2549 Other congenital malformations of aorta: Secondary | ICD-10-CM

## 2017-04-12 MED ORDER — TIKOSYN 500 MCG PO CAPS: 500.0000 ug | ORAL_CAPSULE | Freq: Two times a day (BID) | ORAL | 3 refills | Status: DC

## 2017-04-12 NOTE — Patient Instructions (Addendum)
Medication Instructions:  Your physician recommends that you continue on your current medications as directed. Please refer to the Current Medication list given to you today.  * If you need a refill on your cardiac medications before your next appointment, please call your pharmacy. *  Labwork: Today: BMET & CBC w/ diff  Testing/Procedures: Your physician has requested that you have an echocardiogram in 6 months - a couple days prior to your appointment with Dr. Rayann Heman. Echocardiography is a painless test that uses sound waves to create images of your heart. It provides your doctor with information about the size and shape of your heart and how well your heart's chambers and valves are working. This procedure takes approximately one hour. There are no restrictions for this procedure.  Follow-Up: Your physician wants you to follow-up in: 6 months with Dr. Rayann Heman -- after your echocardiogram.  You will receive a reminder letter in the mail two months in advance. If you don't receive a letter, please call our office to schedule the follow-up appointment.  Thank you for choosing [facility]!!

## 2017-04-12 NOTE — Progress Notes (Signed)
PCP: [name], MD Primary Cardiologist: declines general cardiology follow-up with Dr Claiborne Billings at [facility].  He states that he does not like the [place] office. Primary EP: Dr Macon Large is a 72 y.o. male who presents today for routine electrophysiology followup.  Since last being seen in our clinic, the patient reports doing very well.  Today, he denies symptoms of palpitations, chest pain, shortness of breath,  lower extremity edema, dizziness, presyncope, or syncope.  The patient is otherwise without complaint today.   Past Medical History:  Diagnosis Date  . Cardiomyopathy ([place])    a. 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 b. also hx of tachycardia mediated CM, now resolved  . Complication of anesthesia    Pt has anxiety issues and thinks he is going to die.  . Depression   . Echocardiogram abnormal    EF60%, mild LVH, PA pk pressure 35, pericardial effusion mild increase from 2012   . H/O cardiac catheterization    no significant CAD by cath 5/12  . Hyperlipidemia   . Hypertension   . Limb pain 06/20/2007   LE venous doppler - no evidence of thrombus or thrombophlebitis  . Liver mass    a. CT 03/2013: Small probable cysts in the liver and small hyperenhancing liver mass.  . OSA (obstructive sleep apnea)    AHI avg 5/hr  . Paroxysmal atrial fibrillation (HCC)    a. s/p afib ablations 2002 and 2009 by Dr Rolland Porter. b. s/p DCCV 02/2013. c. s/p DCCV 01/19/2014  . Pericardial effusion    a. Since 2007. b. s/p pericardial window 04/2013.  Marland Kitchen PVC (premature ventricular contraction) 12/18/2009   cardionet monitor 21 days - some PVCs, ventricular bigeminy  . Thoracic aortic aneurysm ([place])    a. Borderline enlarged by CT 12/14 at 4.0cm.   Past Surgical History:  Procedure Laterality Date  . CARDIAC CATHETERIZATION  08/25/10   NO SIGNIFICANT CAD  . CARDIAC ELECTROPHYSIOLOGY MAPPING AND ABLATION  2001,2009   Dr. Tally Due  . CARDIOVASCULAR  STRESS TEST  09/08/2005   R/S MV - EF 61%; Exercises capacity 12 Mets; essentially normal perfusion myocardial scan    . CARDIOVERSION  10/18/2007   successful  . CARDIOVERSION N/A 02/08/2013   Procedure: CARDIOVERSION;  Surgeon: Troy Sine, MD;  Location: [place];  Service: Cardiovascular;  Laterality: N/A;  . CARDIOVERSION N/A 08/17/2013   Procedure: CARDIOVERSION;  Surgeon: Sanda Klein, MD;  Location: [place];  Service: Cardiovascular;  Laterality: N/A;  . CARDIOVERSION N/A 12/28/2013   Procedure: CARDIOVERSION;  Surgeon: Fay Records, MD;  Location: [place];  Service: Cardiovascular;  Laterality: N/A;  . CARDIOVERSION N/A 01/19/2014   Procedure: CARDIOVERSION;  Surgeon: Sanda Klein, MD;  Location: [facility] ENDOSCOPY;  Service: Cardiovascular;  Laterality: N/A;  . CARDIOVERSION N/A 03/11/2014   Procedure: CARDIOVERSION;  Surgeon: Evans Lance, MD;  Location: [place];  Service: Cardiovascular;  Laterality: N/A;  . CARDIOVERSION N/A 08/23/2014   Procedure: CARDIOVERSION;  Surgeon: Lelon Perla, MD;  Location: [facility] ENDOSCOPY;  Service: Cardiovascular;  Laterality: N/A;  . CARDIOVERSION N/A 10/04/2014   Procedure: CARDIOVERSION;  Surgeon: Sueanne Margarita, MD;  Location: [facility] ENDOSCOPY;  Service: Cardiovascular;  Laterality: N/A;  . CARDIOVERSION N/A 05/22/2015   Procedure: CARDIOVERSION;  Surgeon: Thayer Headings, MD;  Location: [place];  Service: Cardiovascular;  Laterality: N/A;  . CARPAL TUNNEL RELEASE Left April 2015  . ELBOW / UPPER ARM FOREIGN BODY  REMOVAL    . ELECTROPHYSIOLOGIC STUDY N/A 09/25/2014   Atrial fibrillation ablation performed by Dr Rayann Heman  . FLEXIBLE SIGMOIDOSCOPY N/A 08/28/2015   Procedure: FLEXIBLE SIGMOIDOSCOPY;  Surgeon: [name], MD;  Location: [facility] ENDOSCOPY;  Service: Endoscopy;  Laterality: N/A;  . FRACTURE SURGERY Left    Left wrist  . HOT HEMOSTASIS N/A 08/28/2015   Procedure: HOT HEMOSTASIS (ARGON PLASMA COAGULATION/BICAP);  Surgeon: [name], MD;  Location: [facility];  Service: Endoscopy;  Laterality: N/A;  . LEFT AND RIGHT HEART CATHETERIZATION WITH CORONARY ANGIOGRAM N/A 03/17/2013   Procedure: LEFT AND RIGHT HEART CATHETERIZATION WITH CORONARY ANGIOGRAM;  Surgeon: Troy Sine, MD;  Location: [facility] CATH LAB;  Service: Cardiovascular;  Laterality: N/A;  . osteosynthesis Left 09/30/2012   with small plate in the ulna  . SUBXYPHOID PERICARDIAL WINDOW N/A 04/25/2013   Procedure: SUBXYPHOID PERICARDIAL WINDOW;  Surgeon: Gaye Pollack, MD;  Location: [facility] OR;  Service: Thoracic;  Laterality: N/A;  . TEE WITHOUT CARDIOVERSION N/A 09/24/2014   Procedure: TRANSESOPHAGEAL ECHOCARDIOGRAM (TEE);  Surgeon: Dorothy Spark, MD;  Location: [facility] ENDOSCOPY;  Service: Cardiovascular;  Laterality: N/A;  . TRANSESOPHAGEAL ECHOCARDIOGRAM  08/26/2010   see Notes tab    ROS- all systems are reviewed and negatives except as per HPI above  Current Outpatient Medications  Medication Sig Dispense Refill  . atorvastatin (LIPITOR) 40 MG tablet TAKE 1 TABLET BY MOUTH EVERY EVENING 90 tablet 3  . Cholecalciferol (VITAMIN D) 2000 UNITS CAPS Take 2,000 Units by mouth daily.    Marland Kitchen diltiazem (CARDIZEM CD) 180 MG 24 hr capsule TAKE 1 CAPSULE (180 MG TOTAL) BY MOUTH DAILY. 90 capsule 3  . finasteride (PROPECIA) 1 MG tablet Take 1 mg by mouth every morning.     . furosemide (LASIX) 40 MG tablet Take 1/2 tablet (20 mg) by mouth once daily 90 tablet 1  . pantoprazole (PROTONIX) 40 MG tablet Take 1 tablet (40 mg total) by mouth daily as needed (heartburn/acid reflux). 90 tablet 3  . potassium chloride SA (K-DUR,KLOR-CON) 20 MEQ tablet Take 1 tablet (20 mEq total) by mouth daily. 135 tablet 1  . rivaroxaban (XARELTO) 20 MG TABS tablet Take 1 tablet (20 mg total) by mouth daily with supper. 90 tablet 1  . Saw Palmetto, Serenoa repens, (SAW PALMETTO PO) Take 900 mg by mouth 2 (two) times daily.     Marland Kitchen TIKOSYN 500 MCG capsule Take 1 capsule (500 mcg total) by mouth  2 (two) times daily. 180 capsule 0   No current facility-administered medications for this visit.     Physical Exam: Vitals:   04/12/17 1209  BP: 118/84  Pulse: 69  Weight: 170 lb 6.4 oz (77.3 kg)  Height: 6\' 2"  (1.88 m)    GEN- The patient is well appearing, alert and oriented x 3 today.   Head- normocephalic, atraumatic Eyes-  Sclera clear, conjunctiva pink Ears- hearing intact Oropharynx- clear Lungs- Clear to ausculation bilaterally, normal work of breathing Heart- Regular rate and rhythm, no murmurs, rubs or gallops, PMI not laterally displaced GI- soft, NT, ND, + BS Extremities- no clubbing, cyanosis, or edema  EKG tracing ordered today is personally reviewed and shows sinus rhythm 69 bpm, PVCs, PR 208 msec, poor R wave progression  Echo 09/22/16 reviewed with the patient again today.  Assessment and Plan:  1. Persistent atrial fibrillation Doing very well at this time. chads2vasc score is at least 3.  Continue xarelto Cbc, bmet, mg today  2. OSA Compliant with  BiPAP  3. HTN Stable No change required today  4. CAD Conservative management  5. Prior sinus of valsalva enlargement Stable by recent echo No change required today Repeat echo upon return  Return to see me in 6 months  Thompson Grayer MD, [facility] 04/12/2017 12:10 PM

## 2017-04-13 LAB — CBC WITH DIFFERENTIAL/PLATELET
BASOS: 0 %
Basophils Absolute: 0 10*3/uL (ref 0.0–0.2)
EOS (ABSOLUTE): 0.2 10*3/uL (ref 0.0–0.4)
EOS: 3 %
HEMATOCRIT: 38.6 % (ref 37.5–51.0)
HEMOGLOBIN: 12.6 g/dL — AB (ref 13.0–17.7)
IMMATURE GRANS (ABS): 0 10*3/uL (ref 0.0–0.1)
Immature Granulocytes: 0 %
LYMPHS ABS: 2 10*3/uL (ref 0.7–3.1)
Lymphs: 36 %
MCH: 29 pg (ref 26.6–33.0)
MCHC: 32.6 g/dL (ref 31.5–35.7)
MCV: 89 fL (ref 79–97)
MONOCYTES: 10 %
Monocytes Absolute: 0.6 10*3/uL (ref 0.1–0.9)
NEUTROS ABS: 2.8 10*3/uL (ref 1.4–7.0)
Neutrophils: 51 %
Platelets: 236 10*3/uL (ref 150–379)
RBC: 4.34 x10E6/uL (ref 4.14–5.80)
RDW: 13.2 % (ref 12.3–15.4)
WBC: 5.6 10*3/uL (ref 3.4–10.8)

## 2017-04-13 LAB — BASIC METABOLIC PANEL
BUN / CREAT RATIO: 15 (ref 10–24)
BUN: 15 mg/dL (ref 8–27)
CHLORIDE: 99 mmol/L (ref 96–106)
CO2: 25 mmol/L (ref 20–29)
Calcium: 9.6 mg/dL (ref 8.6–10.2)
Creatinine, Ser: 1.01 mg/dL (ref 0.76–1.27)
GFR calc Af Amer: 87 mL/min/{1.73_m2} (ref >59)
GFR, EST NON AFRICAN AMERICAN: 75 mL/min/{1.73_m2} (ref >59)
Glucose: 91 mg/dL (ref 65–99)
Potassium: 4.5 mmol/L (ref 3.5–5.2)
Sodium: 140 mmol/L (ref 134–144)

## 2017-04-21 ENCOUNTER — Encounter: Payer: Self-pay | Admitting: Internal Medicine

## 2017-04-28 ENCOUNTER — Ambulatory Visit: Payer: Medicare Other | Admitting: Internal Medicine

## 2017-06-04 ENCOUNTER — Other Ambulatory Visit: Payer: Self-pay | Admitting: Internal Medicine

## 2017-08-12 ENCOUNTER — Other Ambulatory Visit: Payer: Self-pay | Admitting: Internal Medicine

## 2017-08-30 ENCOUNTER — Other Ambulatory Visit: Payer: Self-pay | Admitting: Internal Medicine

## 2017-09-13 ENCOUNTER — Encounter: Payer: Self-pay | Admitting: Internal Medicine

## 2017-09-22 ENCOUNTER — Other Ambulatory Visit: Payer: Self-pay

## 2017-09-22 ENCOUNTER — Ambulatory Visit (HOSPITAL_COMMUNITY): Payer: Medicare Other | Attending: Cardiology

## 2017-09-22 DIAGNOSIS — E785 Hyperlipidemia, unspecified: Secondary | ICD-10-CM | POA: Diagnosis not present

## 2017-09-22 DIAGNOSIS — I119 Hypertensive heart disease without heart failure: Secondary | ICD-10-CM | POA: Diagnosis not present

## 2017-09-22 DIAGNOSIS — I429 Cardiomyopathy, unspecified: Secondary | ICD-10-CM | POA: Diagnosis not present

## 2017-09-22 DIAGNOSIS — I493 Ventricular premature depolarization: Secondary | ICD-10-CM | POA: Insufficient documentation

## 2017-09-22 DIAGNOSIS — G4733 Obstructive sleep apnea (adult) (pediatric): Secondary | ICD-10-CM | POA: Insufficient documentation

## 2017-09-22 DIAGNOSIS — I712 Thoracic aortic aneurysm, without rupture: Secondary | ICD-10-CM | POA: Diagnosis not present

## 2017-09-22 DIAGNOSIS — I480 Paroxysmal atrial fibrillation: Secondary | ICD-10-CM | POA: Diagnosis not present

## 2017-09-22 DIAGNOSIS — I313 Pericardial effusion (noninflammatory): Secondary | ICD-10-CM | POA: Insufficient documentation

## 2017-09-22 DIAGNOSIS — I081 Rheumatic disorders of both mitral and tricuspid valves: Secondary | ICD-10-CM | POA: Diagnosis not present

## 2017-09-22 DIAGNOSIS — I481 Persistent atrial fibrillation: Secondary | ICD-10-CM

## 2017-09-22 DIAGNOSIS — I4819 Other persistent atrial fibrillation: Secondary | ICD-10-CM

## 2017-09-29 ENCOUNTER — Ambulatory Visit: Payer: Medicare Other | Admitting: Internal Medicine

## 2017-09-29 ENCOUNTER — Encounter: Payer: Self-pay | Admitting: Internal Medicine

## 2017-09-29 VITALS — BP 112/70 | HR 68 | Ht 74.0 in | Wt 171.0 lb

## 2017-09-29 DIAGNOSIS — I4819 Other persistent atrial fibrillation: Secondary | ICD-10-CM

## 2017-09-29 DIAGNOSIS — I10 Essential (primary) hypertension: Secondary | ICD-10-CM

## 2017-09-29 DIAGNOSIS — Q2549 Other congenital malformations of aorta: Secondary | ICD-10-CM

## 2017-09-29 DIAGNOSIS — I481 Persistent atrial fibrillation: Secondary | ICD-10-CM

## 2017-09-29 DIAGNOSIS — G4733 Obstructive sleep apnea (adult) (pediatric): Secondary | ICD-10-CM | POA: Diagnosis not present

## 2017-09-29 NOTE — Patient Instructions (Signed)

## 2017-09-29 NOTE — Progress Notes (Signed)
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  Since last being seen in our clinic, the patient reports doing very well.  Today, he denies symptoms of palpitations, chest pain, shortness of breath,  lower extremity edema, dizziness, presyncope, or syncope.  The patient is otherwise without complaint today.   Past Medical History:  Diagnosis Date  . Cardiomyopathy ([place])    a. 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 b. also hx of tachycardia mediated CM, now resolved  . Complication of anesthesia    Pt has anxiety issues and thinks he is going to die.  . Depression   . Echocardiogram abnormal    EF60%, mild LVH, PA pk pressure 35, pericardial effusion mild increase from 2012   . H/O cardiac catheterization    no significant CAD by cath 5/12  . Hyperlipidemia   . Hypertension   . Limb pain 06/20/2007   LE venous doppler - no evidence of thrombus or thrombophlebitis  . Liver mass    a. CT 03/2013: Small probable cysts in the liver and small hyperenhancing liver mass.  . OSA (obstructive sleep apnea)    AHI avg 5/hr  . Paroxysmal atrial fibrillation (HCC)    a. s/p afib ablations 2002 and 2009 by Dr Rolland Porter. b. s/p DCCV 02/2013. c. s/p DCCV 01/19/2014  . Pericardial effusion    a. Since 2007. b. s/p pericardial window 04/2013.  Marland Kitchen PVC (premature ventricular contraction) 12/18/2009   cardionet monitor 21 days - some PVCs, ventricular bigeminy  . Thoracic aortic aneurysm ([place])    a. Borderline enlarged by CT 12/14 at 4.0cm.   Past Surgical History:  Procedure Laterality Date  . CARDIAC CATHETERIZATION  08/25/10   NO SIGNIFICANT CAD  . CARDIAC ELECTROPHYSIOLOGY MAPPING AND ABLATION  2001,2009   Dr. Tally Due  . CARDIOVASCULAR STRESS TEST  09/08/2005   R/S MV - EF 61%; Exercises capacity 12 Mets; essentially normal perfusion myocardial scan    . CARDIOVERSION   10/18/2007   successful  . CARDIOVERSION N/A 02/08/2013   Procedure: CARDIOVERSION;  Surgeon: Troy Sine, MD;  Location: [employer];  Service: Cardiovascular;  Laterality: N/A;  . CARDIOVERSION N/A 08/17/2013   Procedure: CARDIOVERSION;  Surgeon: Sanda Klein, MD;  Location: [place];  Service: Cardiovascular;  Laterality: N/A;  . CARDIOVERSION N/A 12/28/2013   Procedure: CARDIOVERSION;  Surgeon: Fay Records, MD;  Location: [place];  Service: Cardiovascular;  Laterality: N/A;  . CARDIOVERSION N/A 01/19/2014   Procedure: CARDIOVERSION;  Surgeon: Sanda Klein, MD;  Location: [facility] ENDOSCOPY;  Service: Cardiovascular;  Laterality: N/A;  . CARDIOVERSION N/A 03/11/2014   Procedure: CARDIOVERSION;  Surgeon: Evans Lance, MD;  Location: [place];  Service: Cardiovascular;  Laterality: N/A;  . CARDIOVERSION N/A 08/23/2014   Procedure: CARDIOVERSION;  Surgeon: Lelon Perla, MD;  Location: [facility] ENDOSCOPY;  Service: Cardiovascular;  Laterality: N/A;  . CARDIOVERSION N/A 10/04/2014   Procedure: CARDIOVERSION;  Surgeon: Sueanne Margarita, MD;  Location: [facility] ENDOSCOPY;  Service: Cardiovascular;  Laterality: N/A;  . CARDIOVERSION N/A 05/22/2015   Procedure: CARDIOVERSION;  Surgeon: Thayer Headings, MD;  Location: [place];  Service: Cardiovascular;  Laterality: N/A;  . CARPAL TUNNEL RELEASE Left April 2015  . ELBOW / UPPER ARM FOREIGN BODY REMOVAL    . ELECTROPHYSIOLOGIC STUDY N/A 09/25/2014   Atrial fibrillation ablation performed by Dr Rayann Heman  .  FLEXIBLE SIGMOIDOSCOPY N/A 08/28/2015   Procedure: FLEXIBLE SIGMOIDOSCOPY;  Surgeon: [name], MD;  Location: [facility] ENDOSCOPY;  Service: Endoscopy;  Laterality: N/A;  . FRACTURE SURGERY Left    Left wrist  . HOT HEMOSTASIS N/A 08/28/2015   Procedure: HOT HEMOSTASIS (ARGON PLASMA COAGULATION/BICAP);  Surgeon: [name], MD;  Location: [facility] ENDOS[facility];  Service: Endoscopy;  Laterality: N/A;  . LEFT AND RIGHT HEART CATHETERIZATION WITH CORONARY  ANGIOGRAM N/A 03/17/2013   Procedure: LEFT AND RIGHT HEART CATHETERIZATION WITH CORONARY ANGIOGRAM;  Surgeon: Troy Sine, MD;  Location: [facility] CATH LAB;  Service: Cardiovascular;  Laterality: N/A;  . osteosynthesis Left 09/30/2012   with small plate in the ulna  . SUBXYPHOID PERICARDIAL WINDOW N/A 04/25/2013   Procedure: SUBXYPHOID PERICARDIAL WINDOW;  Surgeon: Gaye Pollack, MD;  Location: [facility] OR;  Service: Thoracic;  Laterality: N/A;  . TEE WITHOUT CARDIOVERSION N/A 09/24/2014   Procedure: TRANSESOPHAGEAL ECHOCARDIOGRAM (TEE);  Surgeon: Dorothy Spark, MD;  Location: [facility] ENDOSCOPY;  Service: Cardiovascular;  Laterality: N/A;  . TRANSESOPHAGEAL ECHOCARDIOGRAM  08/26/2010   see Notes tab    ROS- all systems are reviewed and negatives except as per HPI above  Current Outpatient Medications  Medication Sig Dispense Refill  . atorvastatin (LIPITOR) 40 MG tablet TAKE 1 TABLET BY MOUTH EVERY EVENING 90 tablet 3  . Cholecalciferol (VITAMIN D) 2000 UNITS CAPS Take 2,000 Units by mouth daily.    Marland Kitchen diltiazem (CARDIZEM CD) 180 MG 24 hr capsule TAKE 1 CAPSULE (180 MG TOTAL) BY MOUTH DAILY. 90 capsule 1  . finasteride (PROPECIA) 1 MG tablet Take 1 mg by mouth every morning.     . furosemide (LASIX) 40 MG tablet Take 1/2 tablet (20 mg) by mouth once daily 90 tablet 1  . pantoprazole (PROTONIX) 40 MG tablet Take 1 tablet (40 mg total) by mouth daily as needed (heartburn/acid reflux). 90 tablet 3  . potassium chloride SA (KLOR-CON M20) 20 MEQ tablet TAKE ONE (1) TABLET (20 MEQ) BY MOUTH DAILY. 90 tablet 3  . rivaroxaban (XARELTO) 20 MG TABS tablet Take 1 tablet (20 mg total) by mouth daily with supper. 90 tablet 1  . Saw Palmetto, Serenoa repens, (SAW PALMETTO PO) Take 900 mg by mouth 2 (two) times daily.     Marland Kitchen TIKOSYN 500 MCG capsule Take 1 capsule (500 mcg total) by mouth 2 (two) times daily. 180 capsule 3   No current facility-administered medications for this visit.     Physical Exam: Vitals:    09/29/17 1621  BP: 112/70  Pulse: 68  SpO2: 98%  Weight: 171 lb (77.6 kg)  Height: 6\' 2"  (1.88 m)    GEN- The patient is well appearing, alert and oriented x 3 today.   Head- normocephalic, atraumatic Eyes-  Sclera clear, conjunctiva pink Ears- hearing intact Oropharynx- clear Lungs- Clear to ausculation bilaterally, normal work of breathing Heart- Regular rate and rhythm, no murmurs, rubs or gallops, PMI not laterally displaced GI- soft, NT, ND, + BS Extremities- no clubbing, cyanosis, or edema  Wt Readings from Last 3 Encounters:  09/29/17 171 lb (77.6 kg)  04/12/17 170 lb 6.4 oz (77.3 kg)  09/07/16 169 lb 6.4 oz (76.8 kg)    EKG tracing ordered today is personally reviewed and shows sinus rhythm 68 bpm, PR 196 msec, poor r wave progression, QTc 438 msec  Assessment and Plan:  1. Persistent atrial fibrillation Doing well currently chads2vasc score is 3.  Continue xarelto Recent labs reviewed  2. OSA He has  lost substantial weight since his last sleep eval.  He requests further sleep evaluation. Per his request, I will refer to Dr Carlena Sax for sleep evaluation. Compliant with BiPAP currently.  3. HTN Stable No change required today  4. Aortic root (sinus of valsalva) enlargement Echo reveals stable aortic root at 53mm No changes  5. CAD No ischemic symptoms No changes  Return in 6 months  Thompson Grayer MD, [facility] 09/29/2017 4:44 PM

## 2017-10-04 ENCOUNTER — Encounter: Payer: Self-pay | Admitting: Internal Medicine

## 2017-10-04 ENCOUNTER — Telehealth: Payer: Self-pay | Admitting: Internal Medicine

## 2017-10-04 NOTE — Telephone Encounter (Signed)
Labs were received from [place] - labs are from 09/13/17 which is outside the window for cardioversion. They will be redrawn prior to cardioversion. Will have labs scanned in for review.

## 2017-10-04 NOTE — Telephone Encounter (Signed)
New Message:       Pt is calling and states that he had labs sent over to [place] on 09/29/17. Pt states he is due to have a cardioversion done on Friday and needs his labs released so they can see them through Epic

## 2017-10-05 ENCOUNTER — Encounter: Payer: Self-pay | Admitting: Internal Medicine

## 2017-10-05 ENCOUNTER — Other Ambulatory Visit (HOSPITAL_COMMUNITY): Payer: Self-pay | Admitting: *Deleted

## 2017-10-05 NOTE — Telephone Encounter (Signed)
I called the patient and explained that the 2d echo was approved and we always request an additional auth for 3d images just incase the tech needs better pictures, however [employer] denied the 3d imaging code, so they were not done.  He understood and thanked me for clearing it up for him.

## 2017-10-08 ENCOUNTER — Ambulatory Visit (HOSPITAL_COMMUNITY): Payer: Medicare Other | Admitting: Anesthesiology

## 2017-10-08 ENCOUNTER — Encounter (HOSPITAL_COMMUNITY): Payer: Self-pay | Admitting: Nurse Practitioner

## 2017-10-08 ENCOUNTER — Ambulatory Visit (HOSPITAL_BASED_OUTPATIENT_CLINIC_OR_DEPARTMENT_OTHER)
Admission: RE | Admit: 2017-10-08 | Discharge: 2017-10-08 | Disposition: A | Discharge status: Home / Self Care | Payer: Medicare Other | Source: Ambulatory Visit | Attending: Nurse Practitioner | Admitting: Nurse Practitioner

## 2017-10-08 ENCOUNTER — Ambulatory Visit (HOSPITAL_COMMUNITY)
Admission: RE | Admit: 2017-10-08 | Discharge: 2017-10-08 | Disposition: A | Discharge status: Home / Self Care | Payer: Medicare Other | Source: Ambulatory Visit | Attending: Internal Medicine | Admitting: Internal Medicine

## 2017-10-08 ENCOUNTER — Other Ambulatory Visit: Payer: Self-pay

## 2017-10-08 ENCOUNTER — Encounter (HOSPITAL_COMMUNITY)
Admission: RE | Disposition: A | Discharge status: Home / Self Care | Payer: Self-pay | Source: Ambulatory Visit | Attending: Internal Medicine

## 2017-10-08 ENCOUNTER — Encounter (HOSPITAL_COMMUNITY): Payer: Self-pay | Admitting: *Deleted

## 2017-10-08 VITALS — BP 118/64 | HR 98 | Ht 74.0 in | Wt 175.8 lb

## 2017-10-08 DIAGNOSIS — Z955 Presence of coronary angioplasty implant and graft: Secondary | ICD-10-CM | POA: Diagnosis not present

## 2017-10-08 DIAGNOSIS — Z7901 Long term (current) use of anticoagulants: Secondary | ICD-10-CM | POA: Insufficient documentation

## 2017-10-08 DIAGNOSIS — G4733 Obstructive sleep apnea (adult) (pediatric): Secondary | ICD-10-CM | POA: Insufficient documentation

## 2017-10-08 DIAGNOSIS — I481 Persistent atrial fibrillation: Secondary | ICD-10-CM

## 2017-10-08 DIAGNOSIS — Z9889 Other specified postprocedural states: Secondary | ICD-10-CM | POA: Insufficient documentation

## 2017-10-08 DIAGNOSIS — I493 Ventricular premature depolarization: Secondary | ICD-10-CM | POA: Diagnosis not present

## 2017-10-08 DIAGNOSIS — I2510 Atherosclerotic heart disease of native coronary artery without angina pectoris: Secondary | ICD-10-CM | POA: Diagnosis not present

## 2017-10-08 DIAGNOSIS — I4891 Unspecified atrial fibrillation: Secondary | ICD-10-CM | POA: Diagnosis present

## 2017-10-08 DIAGNOSIS — E785 Hyperlipidemia, unspecified: Secondary | ICD-10-CM | POA: Diagnosis not present

## 2017-10-08 DIAGNOSIS — I4819 Other persistent atrial fibrillation: Secondary | ICD-10-CM

## 2017-10-08 DIAGNOSIS — Z79899 Other long term (current) drug therapy: Secondary | ICD-10-CM | POA: Insufficient documentation

## 2017-10-08 HISTORY — PX: CARDIOVERSION: SHX1299

## 2017-10-08 LAB — CBC WITH DIFFERENTIAL/PLATELET
ABS IMMATURE GRANULOCYTES: 0 10*3/uL (ref 0.0–0.1)
BASOS ABS: 0 10*3/uL (ref 0.0–0.1)
Basophils Relative: 1 %
Eosinophils Absolute: 0.2 10*3/uL (ref 0.0–0.7)
Eosinophils Relative: 2 %
HCT: 41.5 % (ref 39.0–52.0)
HEMOGLOBIN: 13 g/dL (ref 13.0–17.0)
Immature Granulocytes: 1 %
LYMPHS PCT: 38 %
Lymphs Abs: 2.3 10*3/uL (ref 0.7–4.0)
MCH: 28.7 pg (ref 26.0–34.0)
MCHC: 31.3 g/dL (ref 30.0–36.0)
MCV: 91.6 fL (ref 78.0–100.0)
Monocytes Absolute: 0.5 10*3/uL (ref 0.1–1.0)
Monocytes Relative: 8 %
NEUTROS ABS: 3.2 10*3/uL (ref 1.7–7.7)
Neutrophils Relative %: 50 %
Platelets: 240 10*3/uL (ref 150–400)
RBC: 4.53 MIL/uL (ref 4.22–5.81)
RDW: 12.9 % (ref 11.5–15.5)
WBC: 6.2 10*3/uL (ref 4.0–10.5)

## 2017-10-08 LAB — BASIC METABOLIC PANEL
ANION GAP: 6 (ref 5–15)
BUN: 12 mg/dL (ref 8–23)
CO2: 31 mmol/L (ref 22–32)
Calcium: 9.9 mg/dL (ref 8.9–10.3)
Chloride: 104 mmol/L (ref 98–111)
Creatinine, Ser: 0.85 mg/dL (ref 0.61–1.24)
GFR calc Af Amer: >60 mL/min (ref >60)
Glucose, Bld: 94 mg/dL (ref 70–99)
POTASSIUM: 3.7 mmol/L (ref 3.5–5.1)
SODIUM: 141 mmol/L (ref 135–145)

## 2017-10-08 SURGERY — CARDIOVERSION
Anesthesia: General

## 2017-10-08 MED ORDER — PROPOFOL 10 MG/ML IV BOLUS
INTRAVENOUS | Status: DC | PRN
  Administered 2017-10-08: 50 mg via INTRAVENOUS

## 2017-10-08 MED ORDER — LIDOCAINE 2% (20 MG/ML) 5 ML SYRINGE
INTRAMUSCULAR | Status: DC | PRN
  Administered 2017-10-08: 60 mg via INTRAVENOUS

## 2017-10-08 MED ORDER — SODIUM CHLORIDE 0.9 % IV SOLN
INTRAVENOUS | Status: DC | PRN
  Administered 2017-10-08: 11:00:00 via INTRAVENOUS

## 2017-10-08 MED ORDER — SODIUM CHLORIDE 0.9 % IV SOLN
INTRAVENOUS | Status: DC
  Administered 2017-10-08: 11:00:00 via INTRAVENOUS

## 2017-10-08 NOTE — CV Procedure (Signed)
   CARDIOVERSION NOTE  Procedure: Electrical Cardioversion Indications:  Atrial Fibrillation  Procedure Details:  Consent: Risks of procedure as well as the alternatives and risks of each were explained to the (patient/caregiver).  Consent for procedure obtained.  Time Out: Verified patient identification, verified procedure, site/side was marked, verified correct patient position, special equipment/implants available, medications/allergies/relevent history reviewed, required imaging and test results available.  Performed  Patient placed on cardiac monitor, pulse oximetry, supplemental oxygen as necessary.  Sedation given: Propofol per anesthesia Pacer pads placed anterior and posterior chest.  Cardioverted 1 time(s).  Cardioverted at 200J biphasic.  Impression: Findings: Post procedure EKG shows: NSR with PVC's Complications: None Patient did tolerate procedure well.  Plan: 1. Successful DCCV with a single 200J biphasic shock.  Time Spent Directly with the Patient:  30 minutes   Pixie Casino, MD, [facility], [place] Director of the Advanced Lipid Disorders &  Cardiovascular Risk Reduction Clinic Diplomate of the American Board of Clinical Lipidology Attending Cardiologist  Direct Dial: [phone]  Fax: [phone]  Website:  [email or web address] 10/08/2017, 11:46 AM

## 2017-10-08 NOTE — H&P (Signed)
   INTERVAL PROCEDURE H&P  History and Physical Interval Note:  10/08/2017 11:09 AM  [name] [name] has presented today for their planned procedure. The various methods of treatment have been discussed with the patient and family. After consideration of risks, benefits and other options for treatment, the patient has consented to the procedure.  The patients' outpatient history has been reviewed, patient examined, and no change in status from most recent office note within the past 30 days. I have reviewed the patients' chart and labs and will proceed as planned. Questions were answered to the patient's satisfaction.   [name] Casino, MD, [facility], [place] Director of the Advanced Lipid Disorders &  Cardiovascular Risk Reduction Clinic Diplomate of the American Board of Clinical Lipidology Attending Cardiologist  Direct Dial: [phone]  Fax: [phone]  Website:  [email or web address] 10/08/2017, 11:09 AM

## 2017-10-08 NOTE — Anesthesia Procedure Notes (Signed)
Procedure Name: MAC Date/Time: 10/08/2017 11:33 AM Performed by: Renato Shin, CRNA Pre-anesthesia Checklist: Patient identified Patient Re-evaluated:Patient Re-evaluated prior to induction Oxygen Delivery Method: Nasal cannula Preoxygenation: Pre-oxygenation with 100% oxygen Induction Type: IV induction Placement Confirmation: positive ETCO2,  CO2 detector and breath sounds checked- equal and bilateral Dental Injury: Teeth and Oropharynx as per pre-operative assessment

## 2017-10-08 NOTE — Anesthesia Postprocedure Evaluation (Signed)
Anesthesia Post Note  Patient: [name] [name]  Procedure(s) Performed: CARDIOVERSION (N/A )     Patient location during evaluation: PACU Anesthesia Type: General Level of consciousness: awake Pain management: pain level controlled Vital Signs Assessment: post-procedure vital signs reviewed and stable Respiratory status: spontaneous breathing, nonlabored ventilation, respiratory function stable and patient connected to nasal cannula oxygen Cardiovascular status: blood pressure returned to baseline and stable Postop Assessment: no apparent nausea or vomiting Anesthetic complications: no    Last Vitals:  Vitals:   10/08/17 1200 10/08/17 1210  BP: 118/83 135/90  Pulse: 73 73  Resp: (!) 29 18  Temp:    SpO2: 100% 100%    Last Pain:  Vitals:   10/08/17 1150  TempSrc: Oral  PainSc: 0-No pain                 Ryan P Ellender

## 2017-10-08 NOTE — Transfer of Care (Signed)
Immediate Anesthesia Transfer of Care Note  Patient: [name] [name]  Procedure(s) Performed: CARDIOVERSION (N/A )  Patient Location: Endoscopy Unit  Anesthesia Type:General  Level of Consciousness: awake, alert , oriented and patient cooperative  Airway & Oxygen Therapy: Patient Spontanous Breathing and Patient connected to nasal cannula oxygen  Post-op Assessment: Report given to RN and Post -op Vital signs reviewed and stable  Post vital signs: Reviewed and stable  Last Vitals:  Vitals Value Taken Time  BP    Temp    Pulse    Resp    SpO2      Last Pain:  Vitals:   10/08/17 1050  TempSrc: Oral  PainSc: 0-No pain         Complications: No apparent anesthesia complications

## 2017-10-08 NOTE — Progress Notes (Signed)
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  Since last being seen in our clinic, the patient reports doing very well.  Today, he denies symptoms of palpitations, chest pain, shortness of breath,  lower extremity edema, dizziness, presyncope, or syncope.  The patient is otherwise without complaint today.   Past Medical History:  Diagnosis Date  . Cardiomyopathy ([place])    a. 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 b. also hx of tachycardia mediated CM, now resolved  . Complication of anesthesia    Pt has anxiety issues and thinks he is going to die.  . Depression   . Echocardiogram abnormal    EF60%, mild LVH, PA pk pressure 35, pericardial effusion mild increase from 2012   . H/O cardiac catheterization    no significant CAD by cath 5/12  . Hyperlipidemia   . Hypertension   . Limb pain 06/20/2007   LE venous doppler - no evidence of thrombus or thrombophlebitis  . Liver mass    a. CT 03/2013: Small probable cysts in the liver and small hyperenhancing liver mass.  . OSA (obstructive sleep apnea)    AHI avg 5/hr  . Paroxysmal atrial fibrillation (HCC)    a. s/p afib ablations 2002 and 2009 by Dr Rolland Porter. b. s/p DCCV 02/2013. c. s/p DCCV 01/19/2014  . Pericardial effusion    a. Since 2007. b. s/p pericardial window 04/2013.  Marland Kitchen PVC (premature ventricular contraction) 12/18/2009   cardionet monitor 21 days - some PVCs, ventricular bigeminy  . Thoracic aortic aneurysm ([place])    a. Borderline enlarged by CT 12/14 at 4.0cm.   Past Surgical History:  Procedure Laterality Date  . CARDIAC CATHETERIZATION  08/25/10   NO SIGNIFICANT CAD  . CARDIAC ELECTROPHYSIOLOGY MAPPING AND ABLATION  2001,2009   Dr. Tally Due  . CARDIOVASCULAR STRESS TEST  09/08/2005   R/S MV - EF 61%; Exercises capacity 12 Mets; essentially normal perfusion myocardial scan    . CARDIOVERSION   10/18/2007   successful  . CARDIOVERSION N/A 02/08/2013   Procedure: CARDIOVERSION;  Surgeon: Troy Sine, MD;  Location: [place];  Service: Cardiovascular;  Laterality: N/A;  . CARDIOVERSION N/A 08/17/2013   Procedure: CARDIOVERSION;  Surgeon: Sanda Klein, MD;  Location: [place];  Service: Cardiovascular;  Laterality: N/A;  . CARDIOVERSION N/A 12/28/2013   Procedure: CARDIOVERSION;  Surgeon: Fay Records, MD;  Location: [place];  Service: Cardiovascular;  Laterality: N/A;  . CARDIOVERSION N/A 01/19/2014   Procedure: CARDIOVERSION;  Surgeon: Sanda Klein, MD;  Location: [facility] ENDOSCOPY;  Service: Cardiovascular;  Laterality: N/A;  . CARDIOVERSION N/A 03/11/2014   Procedure: CARDIOVERSION;  Surgeon: Evans Lance, MD;  Location: [place];  Service: Cardiovascular;  Laterality: N/A;  . CARDIOVERSION N/A 08/23/2014   Procedure: CARDIOVERSION;  Surgeon: Lelon Perla, MD;  Location: [facility] ENDOSCOPY;  Service: Cardiovascular;  Laterality: N/A;  . CARDIOVERSION N/A 10/04/2014   Procedure: CARDIOVERSION;  Surgeon: Sueanne Margarita, MD;  Location: [facility] ENDOSCOPY;  Service: Cardiovascular;  Laterality: N/A;  . CARDIOVERSION N/A 05/22/2015   Procedure: CARDIOVERSION;  Surgeon: Thayer Headings, MD;  Location: [place];  Service: Cardiovascular;  Laterality: N/A;  . CARPAL TUNNEL RELEASE Left April 2015  . ELBOW / UPPER ARM FOREIGN BODY REMOVAL    . ELECTROPHYSIOLOGIC STUDY N/A 09/25/2014   Atrial fibrillation ablation performed by Dr Rayann Heman  .  FLEXIBLE SIGMOIDOSCOPY N/A 08/28/2015   Procedure: FLEXIBLE SIGMOIDOSCOPY;  Surgeon: [name], MD;  Location: [facility] ENDOSCOPY;  Service: Endoscopy;  Laterality: N/A;  . FRACTURE SURGERY Left    Left wrist  . HOT HEMOSTASIS N/A 08/28/2015   Procedure: HOT HEMOSTASIS (ARGON PLASMA COAGULATION/BICAP);  Surgeon: [name], MD;  Location: [facility] ENDOSCOPY;  Service: Endoscopy;  Laterality: N/A;  . LEFT AND RIGHT HEART CATHETERIZATION WITH CORONARY  ANGIOGRAM N/A 03/17/2013   Procedure: LEFT AND RIGHT HEART CATHETERIZATION WITH CORONARY ANGIOGRAM;  Surgeon: Troy Sine, MD;  Location: [facility] CATH LAB;  Service: Cardiovascular;  Laterality: N/A;  . osteosynthesis Left 09/30/2012   with small plate in the ulna  . SUBXYPHOID PERICARDIAL WINDOW N/A 04/25/2013   Procedure: SUBXYPHOID PERICARDIAL WINDOW;  Surgeon: Gaye Pollack, MD;  Location: [facility] OR;  Service: Thoracic;  Laterality: N/A;  . TEE WITHOUT CARDIOVERSION N/A 09/24/2014   Procedure: TRANSESOPHAGEAL ECHOCARDIOGRAM (TEE);  Surgeon: Dorothy Spark, MD;  Location: [facility] ENDOSCOPY;  Service: Cardiovascular;  Laterality: N/A;  . TRANSESOPHAGEAL ECHOCARDIOGRAM  08/26/2010   see Notes tab    ROS- all systems are reviewed and negatives except as per HPI above  Current Outpatient Medications  Medication Sig Dispense Refill  . atorvastatin (LIPITOR) 40 MG tablet TAKE 1 TABLET BY MOUTH EVERY EVENING 90 tablet 3  . Cholecalciferol (VITAMIN D) 2000 UNITS CAPS Take 2,000 Units by mouth daily.    Marland Kitchen diltiazem (CARDIZEM CD) 180 MG 24 hr capsule TAKE 1 CAPSULE (180 MG TOTAL) BY MOUTH DAILY. 90 capsule 1  . finasteride (PROPECIA) 1 MG tablet Take 1 mg by mouth every morning.     . furosemide (LASIX) 40 MG tablet Take 1/2 tablet (20 mg) by mouth once daily (Patient taking differently: 40 mg. Take 1/2 tablet (20 mg) by mouth once daily) 90 tablet 1  . loratadine (CLARITIN) 10 MG tablet Take 10 mg by mouth daily as needed for allergies.    . metoprolol succinate (TOPROL-XL) 25 MG 24 hr tablet Take 12.5 mg by mouth daily as needed (rapid heartrate).    . potassium chloride SA (KLOR-CON M20) 20 MEQ tablet TAKE ONE (1) TABLET (20 MEQ) BY MOUTH DAILY. 90 tablet 3  . rivaroxaban (XARELTO) 20 MG TABS tablet Take 1 tablet (20 mg total) by mouth daily with supper. 90 tablet 1  . Saw Palmetto, Serenoa repens, (SAW PALMETTO PO) Take 900 mg by mouth 2 (two) times daily.     Marland Kitchen TIKOSYN 500 MCG capsule Take 1 capsule  (500 mcg total) by mouth 2 (two) times daily. 180 capsule 3   No current facility-administered medications for this encounter.     Physical Exam: Vitals:   10/08/17 0945  BP: 118/64  Pulse: 98  Weight: 175 lb 12.8 oz (79.7 kg)  Height: 6\' 2"  (1.88 m)    GEN- The patient is well appearing, alert and oriented x 3 today.   Head- normocephalic, atraumatic Eyes-  Sclera clear, conjunctiva pink Ears- hearing intact Oropharynx- clear Lungs- Clear to ausculation bilaterally, normal work of breathing Heart- Regular rate and rhythm, no murmurs, rubs or gallops, PMI not laterally displaced GI- soft, NT, ND, + BS Extremities- no clubbing, cyanosis, or edema  Wt Readings from Last 3 Encounters:  10/08/17 175 lb 12.8 oz (79.7 kg)  09/29/17 171 lb (77.6 kg)  04/12/17 170 lb 6.4 oz (77.3 kg)    EKG tracing ordered today is personally reviewed and shows sinus rhythm 68 bpm, PR 196 msec, poor r wave  progression, QTc 438 msec  Assessment and Plan:  1. Persistent atrial fibrillation Doing well currently chads2vasc score is 3.  Continue xarelto Recent labs reviewed  2. OSA He has lost substantial weight since his last sleep eval.  He requests further sleep evaluation. Per his request, I will refer to Dr Carlena Sax for sleep evaluation. Compliant with BiPAP currently.  3. HTN Stable No change required today  4. Aortic root (sinus of valsalva) enlargement Echo reveals stable aortic root at 68mm No changes  5. CAD No ischemic symptoms No changes  Return in 6 months  Thompson Grayer MD, [facility] 10/08/2017 9:54 AM      ADDENDUM- 10/08/17- Pt called the office going back into afib just  1-2 days after seeing Dr. Rayann Heman, 6/26. He called the office wanting a cardioversion as he is on Tikosyn, with a  lengthy afib history and 80 + cardioversion's,  will not convert to SR unless he is cardioverted.  No know triggers. He has doubled his lasix since returning to afib  as he had a 4 lb weight  gain. Labs drawn and to endo unit. No missed doses of xarelto or tikosyn. Qtc is stable.  Geroge Baseman Jayvan Mcshan, [facility] [address] [place], [place] [address] [phone]

## 2017-10-08 NOTE — Anesthesia Preprocedure Evaluation (Addendum)
Anesthesia Evaluation  Patient identified by MRN, date of birth, ID band Patient awake    Reviewed: Allergy & Precautions, NPO status , Patient's Chart, lab work & pertinent test results  Airway Mallampati: II  TM Distance: >3 FB Neck ROM: Full    Dental no notable dental hx.    Pulmonary sleep apnea and Continuous Positive Airway Pressure Ventilation , former smoker,    Pulmonary exam normal breath sounds clear to auscultation       Cardiovascular hypertension, Pt. on home beta blockers +CHF  Normal cardiovascular exam Rhythm:Irregular Rate:Normal  ECG: A-Fib, rate 98  ECHO: Left ventricle: The cavity size was normal. Systolic function was normal. The estimated ejection fraction was in the range of 55% to 60%. Wall motion was normal; there were no regional wall motion abnormalities. Features are consistent with a pseudonormal left ventricular filling pattern, with concomitant abnormal relaxation and increased filling pressure (grade 2 diastolic dysfunction). Aorta: Aortic root dimension: 45 mm (ED). Ascending aorta: The ascending aorta was mildly dilated. Mitral valve: There was mild regurgitation. Left atrium: The atrium was mildly dilated. Volume/bsa, ES, (1-plane Simpson&'s, A2C): 40.4 ml/m^2. Right ventricle: The cavity size was mildly dilated. Wall thickness was normal. Right atrium: The atrium was mildly dilated. Tricuspid valve: There was mild regurgitation.     Neuro/Psych PSYCHIATRIC DISORDERS Depression negative neurological ROS     GI/Hepatic negative GI ROS, Neg liver ROS,   Endo/Other  negative endocrine ROS  Renal/GU negative Renal ROS     Musculoskeletal negative musculoskeletal ROS (+)   Abdominal   Peds  Hematology HLD   Anesthesia Other Findings AFIB  Reproductive/Obstetrics                            Anesthesia Physical Anesthesia Plan  ASA: III  Anesthesia  Plan: General   Post-op Pain Management:    Induction: Intravenous  PONV Risk Score and Plan: 2 and Treatment may vary due to age or medical condition and Propofol infusion  Airway Management Planned: Mask  Additional Equipment:   Intra-op Plan:   Post-operative Plan:   Informed Consent: I have reviewed the patients History and Physical, chart, labs and discussed the procedure including the risks, benefits and alternatives for the proposed anesthesia with the patient or authorized representative who has indicated his/her understanding and acceptance.   Dental advisory given  Plan Discussed with: CRNA  Anesthesia Plan Comments:         Anesthesia Quick Evaluation

## 2017-10-08 NOTE — Discharge Instructions (Signed)
Electrical Cardioversion, Care After °This sheet gives you information about how to care for yourself after your procedure. Your health care provider may also give you more specific instructions. If you have problems or questions, contact your health care provider. °What can I expect after the procedure? °After the procedure, it is common to have: °· Some redness on the skin where the shocks were given. ° °Follow these instructions at home: °· Do not drive for 24 hours if you were given a medicine to help you relax (sedative). °· Take over-the-counter and prescription medicines only as told by your health care provider. °· Ask your health care provider how to check your pulse. Check it often. °· Rest for 48 hours after the procedure or as told by your health care provider. °· Avoid or limit your caffeine use as told by your health care provider. °Contact a health care provider if: °· You feel like your heart is beating too quickly or your pulse is not regular. °· You have a serious muscle cramp that does not go away. °Get help right away if: °· You have discomfort in your chest. °· You are dizzy or you feel faint. °· You have trouble breathing or you are short of breath. °· Your speech is slurred. °· You have trouble moving an arm or leg on one side of your body. °· Your fingers or toes turn cold or blue. °This information is not intended to replace advice given to you by your health care provider. Make sure you discuss any questions you have with your health care provider. °Document Released: 01/11/2013 Document Revised: 10/25/2015 Document Reviewed: 09/27/2015 °Elsevier Interactive Patient Education © 2018 Elsevier Inc. ° °

## 2017-10-20 ENCOUNTER — Ambulatory Visit (HOSPITAL_COMMUNITY): Payer: Medicare Other | Admitting: Nurse Practitioner

## 2017-10-21 ENCOUNTER — Other Ambulatory Visit: Payer: Self-pay

## 2017-10-21 ENCOUNTER — Ambulatory Visit (HOSPITAL_COMMUNITY)
Admission: RE | Admit: 2017-10-21 | Discharge: 2017-10-21 | Disposition: A | Discharge status: Home / Self Care | Payer: Medicare Other | Source: Ambulatory Visit | Attending: Nurse Practitioner | Admitting: Nurse Practitioner

## 2017-10-21 ENCOUNTER — Encounter (HOSPITAL_COMMUNITY): Payer: Self-pay | Admitting: Nurse Practitioner

## 2017-10-21 VITALS — BP 136/80 | HR 64 | Ht 74.0 in | Wt 173.2 lb

## 2017-10-21 DIAGNOSIS — I429 Cardiomyopathy, unspecified: Secondary | ICD-10-CM | POA: Insufficient documentation

## 2017-10-21 DIAGNOSIS — E785 Hyperlipidemia, unspecified: Secondary | ICD-10-CM | POA: Diagnosis not present

## 2017-10-21 DIAGNOSIS — Z9889 Other specified postprocedural states: Secondary | ICD-10-CM | POA: Diagnosis not present

## 2017-10-21 DIAGNOSIS — G4733 Obstructive sleep apnea (adult) (pediatric): Secondary | ICD-10-CM | POA: Insufficient documentation

## 2017-10-21 DIAGNOSIS — I480 Paroxysmal atrial fibrillation: Secondary | ICD-10-CM | POA: Diagnosis not present

## 2017-10-21 DIAGNOSIS — I10 Essential (primary) hypertension: Secondary | ICD-10-CM | POA: Insufficient documentation

## 2017-10-21 DIAGNOSIS — Z87891 Personal history of nicotine dependence: Secondary | ICD-10-CM | POA: Insufficient documentation

## 2017-10-21 DIAGNOSIS — Z7901 Long term (current) use of anticoagulants: Secondary | ICD-10-CM | POA: Insufficient documentation

## 2017-10-21 DIAGNOSIS — Z79899 Other long term (current) drug therapy: Secondary | ICD-10-CM | POA: Diagnosis not present

## 2017-10-21 DIAGNOSIS — I481 Persistent atrial fibrillation: Secondary | ICD-10-CM

## 2017-10-21 DIAGNOSIS — I4819 Other persistent atrial fibrillation: Secondary | ICD-10-CM

## 2017-10-21 DIAGNOSIS — I493 Ventricular premature depolarization: Secondary | ICD-10-CM | POA: Diagnosis not present

## 2017-10-21 NOTE — Progress Notes (Signed)
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 male with a h/o very complex afib history requiring 46 + cardioversion's, ablations and tikosyn, over many years of having afib. He had done very well until recently with afib being quiet x 3 years. He went back into afib and had successful cardioversion. EKG shows SR today. He feels some fatigue, probably 2/2 starting back on daily metoprolol and discussed weaning back off this now in SR. He is till taking 40 of lasix daily, usual is 20 mg  daily and his weight is stable. Advised to go back to 20 mg daily.  Today, he denies symptoms of palpitations, chest pain, shortness of breath, orthopnea, PND, lower extremity edema, dizziness, presyncope, syncope, or neurologic sequela. Mild fatigue. The patient is tolerating medications without difficulties and is otherwise without complaint today.   Past Medical History:  Diagnosis Date  . Cardiomyopathy ([place])    a. 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 b. also hx of tachycardia mediated CM, now resolved  . Complication of anesthesia    Pt has anxiety issues and thinks he is going to die.  . Depression   . Echocardiogram abnormal    EF60%, mild LVH, PA pk pressure 35, pericardial effusion mild increase from 2012   . H/O cardiac catheterization    no significant CAD by cath 5/12  . Hyperlipidemia   . Hypertension   . Limb pain 06/20/2007   LE venous doppler - no evidence of thrombus or thrombophlebitis  . Liver mass    a. CT 03/2013: Small probable cysts in the liver and small hyperenhancing liver mass.  . OSA (obstructive sleep apnea)    AHI avg 5/hr  . Paroxysmal atrial fibrillation (HCC)    a. s/p afib ablations 2002 and 2009 by Dr Rolland Porter. b. s/p DCCV 02/2013. c. s/p DCCV 01/19/2014  . Pericardial effusion    a. Since 2007. b. s/p pericardial window 04/2013.  Marland Kitchen PVC (premature ventricular contraction) 12/18/2009   cardionet monitor 21 days - some PVCs, ventricular bigeminy  . Thoracic aortic aneurysm ([place])    a. Borderline enlarged by CT 12/14 at 4.0cm.   Past Surgical History:  Procedure Laterality Date  . CARDIAC CATHETERIZATION  08/25/10   NO SIGNIFICANT CAD  . CARDIAC ELECTROPHYSIOLOGY MAPPING AND ABLATION  2001,2009   Dr. Tally Due  . CARDIOVASCULAR STRESS TEST  09/08/2005   R/S MV - EF 61%; Exercises capacity 12 Mets; essentially normal perfusion myocardial scan    . CARDIOVERSION  10/18/2007   successful  . CARDIOVERSION N/A 02/08/2013   Procedure: CARDIOVERSION;  Surgeon: Troy Sine, MD;  Location: [place];  Service: Cardiovascular;  Laterality: N/A;  . CARDIOVERSION N/A 08/17/2013   Procedure: CARDIOVERSION;  Surgeon: Sanda Klein, MD;  Location: [place] ENDOSCOPY;  Service: Cardiovascular;  Laterality: N/A;  . CARDIOVERSION N/A 12/28/2013   Procedure: CARDIOVERSION;  Surgeon: Fay Records, MD;  Location: [facility] ENDOSCOPY;  Service: Cardiovascular;  Laterality: N/A;  . CARDIOVERSION N/A 01/19/2014   Procedure: CARDIOVERSION;  Surgeon: Sanda Klein, MD;  Location: [facility] ENDOSCOPY;  Service: Cardiovascular;  Laterality: N/A;  . CARDIOVERSION N/A 03/11/2014   Procedure: CARDIOVERSION;  Surgeon: Evans Lance, MD;  Location: [place];  Service: Cardiovascular;  Laterality: N/A;  . CARDIOVERSION N/A 08/23/2014   Procedure: CARDIOVERSION;  Surgeon: Lelon Perla, MD;  Location: [facility] ENDOSCOPY;  Service: Cardiovascular;  Laterality: N/A;  . CARDIOVERSION  N/A 10/04/2014   Procedure: CARDIOVERSION;  Surgeon: Sueanne Margarita, MD;  Location: [facility] ENDOSCOPY;  Service: Cardiovascular;  Laterality: N/A;  . CARDIOVERSION N/A 05/22/2015   Procedure: CARDIOVERSION;  Surgeon: Thayer Headings, MD;  Location: [place];  Service: Cardiovascular;  Laterality: N/A;  . CARDIOVERSION N/A 10/08/2017   Procedure: CARDIOVERSION;  Surgeon: Pixie Casino, MD;  Location: [facility] ENDOSCOPY;  Service: Cardiovascular;   Laterality: N/A;  . CARPAL TUNNEL RELEASE Left April 2015  . ELBOW / UPPER ARM FOREIGN BODY REMOVAL    . ELECTROPHYSIOLOGIC STUDY N/A 09/25/2014   Atrial fibrillation ablation performed by Dr Rayann Heman  . FLEXIBLE SIGMOIDOSCOPY N/A 08/28/2015   Procedure: FLEXIBLE SIGMOIDOSCOPY;  Surgeon: [name], MD;  Location: [facility] ENDOSCOPY;  Service: Endoscopy;  Laterality: N/A;  . FRACTURE SURGERY Left    Left wrist  . HOT HEMOSTASIS N/A 08/28/2015   Procedure: HOT HEMOSTASIS (ARGON PLASMA COAGULATION/BICAP);  Surgeon: [name], MD;  Location: [facility] ENDOSCOPY;  Service: Endoscopy;  Laterality: N/A;  . LEFT AND RIGHT HEART CATHETERIZATION WITH CORONARY ANGIOGRAM N/A 03/17/2013   Procedure: LEFT AND RIGHT HEART CATHETERIZATION WITH CORONARY ANGIOGRAM;  Surgeon: Troy Sine, MD;  Location: [facility] CATH LAB;  Service: Cardiovascular;  Laterality: N/A;  . osteosynthesis Left 09/30/2012   with small plate in the ulna  . SUBXYPHOID PERICARDIAL WINDOW N/A 04/25/2013   Procedure: SUBXYPHOID PERICARDIAL WINDOW;  Surgeon: Gaye Pollack, MD;  Location: [facility] OR;  Service: Thoracic;  Laterality: N/A;  . TEE WITHOUT CARDIOVERSION N/A 09/24/2014   Procedure: TRANSESOPHAGEAL ECHOCARDIOGRAM (TEE);  Surgeon: Dorothy Spark, MD;  Location: [facility] ENDOSCOPY;  Service: Cardiovascular;  Laterality: N/A;  . TRANSESOPHAGEAL ECHOCARDIOGRAM  08/26/2010   see Notes tab    Current Outpatient Medications  Medication Sig Dispense Refill  . atorvastatin (LIPITOR) 40 MG tablet TAKE 1 TABLET BY MOUTH EVERY EVENING 90 tablet 3  . Cholecalciferol (VITAMIN D) 2000 UNITS CAPS Take 2,000 Units by mouth daily.    Marland Kitchen diltiazem (CARDIZEM CD) 180 MG 24 hr capsule TAKE 1 CAPSULE (180 MG TOTAL) BY MOUTH DAILY. 90 capsule 1  . finasteride (PROPECIA) 1 MG tablet Take 1 mg by mouth every morning.     . furosemide (LASIX) 40 MG tablet Take 20 mg by mouth as directed. Pt is taking 1 tablet po qd    . loratadine (CLARITIN) 10 MG tablet Take 10 mg by  mouth daily as needed for allergies.    . metoprolol succinate (TOPROL-XL) 25 MG 24 hr tablet Take 12.5 mg by mouth daily as needed (rapid heartrate).    . potassium chloride SA (KLOR-CON M20) 20 MEQ tablet TAKE ONE (1) TABLET (20 MEQ) BY MOUTH DAILY. 90 tablet 3  . rivaroxaban (XARELTO) 20 MG TABS tablet Take 1 tablet (20 mg total) by mouth daily with supper. 90 tablet 1  . Saw Palmetto, Serenoa repens, (SAW PALMETTO PO) Take 900 mg by mouth 2 (two) times daily.     Marland Kitchen TIKOSYN 500 MCG capsule Take 1 capsule (500 mcg total) by mouth 2 (two) times daily. 180 capsule 3   No current facility-administered medications for this encounter.     Allergies  Allergen Reactions  . Tetracyclines & Related Hives    rash    Social History   Socioeconomic History  . Marital status: Single    Spouse name: Not on file  . Number of children: 0  . Years of education: Not on file  . Highest education level: Not on file  Occupational History    Employer: [employer]  Social Needs  . Financial resource strain: Not on file  . Food insecurity:    Worry: Not on file    Inability: Not on file  . Transportation needs:    Medical: Not on file    Non-medical: Not on file  Tobacco Use  . Smoking status: Former Smoker    Packs/day: 1.00    Years: 10.00    Pack years: 10.00    Last attempt to quit: 02/06/1989    Years since quitting: 28.7  . Smokeless tobacco: Never Used  Substance and Sexual Activity  . Alcohol use: No    Comment: 2 bottles of wine per week, pt states has not drank alcohol since christmas  . Drug use: No  . Sexual activity: Not on file  Lifestyle  . Physical activity:    Days per week: Not on file    Minutes per session: Not on file  . Stress: Not on file  Relationships  . Social connections:    Talks on phone: Not on file    Gets together: Not on file    Attends religious service: Not on file    Active member of club or organization: Not on file    Attends meetings of  clubs or organizations: Not on file    Relationship status: Not on file  . Intimate partner violence:    Fear of current or ex partner: Not on file    Emotionally abused: Not on file    Physically abused: Not on file    Forced sexual activity: Not on file  Other Topics Concern  . 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    Family History  Problem Relation Age of Onset  . Coronary artery disease Father   . Heart attack Father   . Stroke Mother        died  . Atrial fibrillation Brother   . Mitral valve prolapse Sister     ROS- All systems are reviewed and negative except as per the HPI above  Physical Exam: Vitals:   10/21/17 1520  BP: 136/80  Pulse: 64  SpO2: 99%  Weight: 173 lb 3.2 oz (78.6 kg)  Height: 6\' 2"  (1.88 m)   Wt Readings from Last 3 Encounters:  10/21/17 173 lb 3.2 oz (78.6 kg)  10/08/17 175 lb 12.8 oz (79.7 kg)  09/29/17 171 lb (77.6 kg)    Labs: Lab Results  Component Value Date   NA 141 10/08/2017   K 3.7 10/08/2017   CL 104 10/08/2017   CO2 31 10/08/2017   GLUCOSE 94 10/08/2017   BUN 12 10/08/2017   CREATININE 0.85 10/08/2017   CALCIUM 9.9 10/08/2017   MG 2.0 03/25/2016   Lab Results  Component Value Date   INR 0.90 04/24/2013   Lab Results  Component Value Date   CHOL  08/23/2007    188        ATP III CLASSIFICATION:  <200     mg/dL   Desirable  200-239  mg/dL   Borderline High  >=240    mg/dL   High   HDL 47 08/23/2007   LDLCALC (H) 08/23/2007    128        Total  Cholesterol/HDL:CHD Risk Coronary Heart Disease Risk Table                     Men   Women  1/2 Average Risk   3.4   3.3   TRIG 63 08/23/2007     GEN- The patient is well appearing, alert and oriented x 3 today.   Head- normocephalic, atraumatic Eyes-  Sclera clear,  conjunctiva pink Ears- hearing intact Oropharynx- clear Neck- supple, no JVP Lymph- no cervical lymphadenopathy Lungs- Clear to ausculation bilaterally, normal work of breathing Heart- Regular rate and rhythm, no murmurs, rubs or gallops, PMI not laterally displaced GI- soft, NT, ND, + BS Extremities- no clubbing, cyanosis, or edema MS- no significant deformity or atrophy Skin- no rash or lesion Psych- euthymic mood, full affect Neuro- strength and sensation are intact  EKG-sinus brady at 59 bpm, qtc 433 ms    Assessment and Plan: 1. Persistent afib  Successful cardioversion  Continue Tikosyn 500 mcg bid Continue diltiazem 180 mg daily Wean off BB, restart if increase in palpitations Decrease  lasix off to 20 mg a day as weight looks stable  F/u with Dr. Rayann Heman 12/23 afib clinic as needed  Geroge Baseman. Katey Barrie, [facility] [address] [place], [place] [address] [phone]

## 2017-11-26 ENCOUNTER — Other Ambulatory Visit: Payer: Self-pay | Admitting: Internal Medicine

## 2017-11-26 NOTE — Telephone Encounter (Signed)
Pt last saw Dr Rayann Heman on 09/29/17, last labs 10/08/17 Creat 0.85, age 72, weight 78.6kg, CrCl 88.62, based on CrCl pt is on appropriate dosage of Xarelto 20mg  QD.  Will refill rx.

## 2018-03-28 ENCOUNTER — Ambulatory Visit: Payer: Medicare Other | Admitting: Internal Medicine

## 2018-03-28 ENCOUNTER — Encounter: Payer: Self-pay | Admitting: Internal Medicine

## 2018-03-28 VITALS — BP 128/84 | HR 66 | Ht 73.5 in | Wt 172.6 lb

## 2018-03-28 DIAGNOSIS — Q2549 Other congenital malformations of aorta: Secondary | ICD-10-CM | POA: Diagnosis not present

## 2018-03-28 DIAGNOSIS — G4733 Obstructive sleep apnea (adult) (pediatric): Secondary | ICD-10-CM

## 2018-03-28 DIAGNOSIS — I4819 Other persistent atrial fibrillation: Secondary | ICD-10-CM | POA: Diagnosis not present

## 2018-03-28 DIAGNOSIS — I10 Essential (primary) hypertension: Secondary | ICD-10-CM | POA: Diagnosis not present

## 2018-03-28 NOTE — Patient Instructions (Addendum)
Medication Instructions:  Your physician recommends that you continue on your current medications as directed. Please refer to the Current Medication list given to you today.  Labwork: You will get lab work today:  Bmet and magnesium  Testing/Procedures: Your physician has requested that you have an echocardiogram. Echocardiography is a painless test that uses sound waves to create images of your heart. It provides your doctor with information about the size and shape of your heart and how well your heart's chambers and valves are working. This procedure takes approximately one hour. There are no restrictions for this procedure.  Please schedule an ECHO for June 2020  Follow-Up: Your physician wants you to follow-up in: 6 months with Roderic Palau NP at the afib clinic.   You will receive a reminder letter in the mail two months in advance. If you don't receive a letter, please call our office to schedule the follow-up appointment.  Any Other Special Instructions Will Be Listed Below (If Applicable).  If you need a refill on your cardiac medications before your next appointment, please call your pharmacy.

## 2018-03-28 NOTE — Progress Notes (Signed)
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  Since last being seen in our clinic, the patient reports doing very well.  He did have afib in June after traveling to a family gathering in [place].  He was cardioverted.  he has done well since, without any further arrhythmias.  Today, he denies symptoms of palpitations, chest pain, shortness of breath,  lower extremity edema, dizziness, presyncope, or syncope.  The patient is otherwise without complaint today.   Past Medical History:  Diagnosis Date  . Cardiomyopathy ([place])    a. 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 b. also hx of tachycardia mediated CM, now resolved  . Complication of anesthesia    Pt has anxiety issues and thinks he is going to die.  . Depression   . Echocardiogram abnormal    EF60%, mild LVH, PA pk pressure 35, pericardial effusion mild increase from 2012   . H/O cardiac catheterization    no significant CAD by cath 5/12  . Hyperlipidemia   . Hypertension   . Limb pain 06/20/2007   LE venous doppler - no evidence of thrombus or thrombophlebitis  . Liver mass    a. CT 03/2013: Small probable cysts in the liver and small hyperenhancing liver mass.  . OSA (obstructive sleep apnea)    AHI avg 5/hr  . Paroxysmal atrial fibrillation (HCC)    a. s/p afib ablations 2002 and 2009 by Dr Rolland Porter. b. s/p DCCV 02/2013. c. s/p DCCV 01/19/2014  . Pericardial effusion    a. Since 2007. b. s/p pericardial window 04/2013.  Marland Kitchen PVC (premature ventricular contraction) 12/18/2009   cardionet monitor 21 days - some PVCs, ventricular bigeminy  . Thoracic aortic aneurysm ([place])    a. Borderline enlarged by CT 12/14 at 4.0cm.   Past Surgical History:  Procedure Laterality Date  . CARDIAC CATHETERIZATION  08/25/10   NO SIGNIFICANT CAD  . CARDIAC ELECTROPHYSIOLOGY MAPPING AND ABLATION  2001,2009   Dr. Tally Due  .  CARDIOVASCULAR STRESS TEST  09/08/2005   R/S MV - EF 61%; Exercises capacity 12 Mets; essentially normal perfusion myocardial scan    . CARDIOVERSION  10/18/2007   successful  . CARDIOVERSION N/A 02/08/2013   Procedure: CARDIOVERSION;  Surgeon: Troy Sine, MD;  Location: [place];  Service: Cardiovascular;  Laterality: N/A;  . CARDIOVERSION N/A 08/17/2013   Procedure: CARDIOVERSION;  Surgeon: Sanda Klein, MD;  Location: [place];  Service: Cardiovascular;  Laterality: N/A;  . CARDIOVERSION N/A 12/28/2013   Procedure: CARDIOVERSION;  Surgeon: Fay Records, MD;  Location: [place];  Service: Cardiovascular;  Laterality: N/A;  . CARDIOVERSION N/A 01/19/2014   Procedure: CARDIOVERSION;  Surgeon: Sanda Klein, MD;  Location: [facility] ENDOSCOPY;  Service: Cardiovascular;  Laterality: N/A;  . CARDIOVERSION N/A 03/11/2014   Procedure: CARDIOVERSION;  Surgeon: Evans Lance, MD;  Location: [place];  Service: Cardiovascular;  Laterality: N/A;  . CARDIOVERSION N/A 08/23/2014   Procedure: CARDIOVERSION;  Surgeon: Lelon Perla, MD;  Location: [facility] ENDOSCOPY;  Service: Cardiovascular;  Laterality: N/A;  . CARDIOVERSION N/A 10/04/2014   Procedure: CARDIOVERSION;  Surgeon: Sueanne Margarita, MD;  Location: [facility] ENDOSCOPY;  Service: Cardiovascular;  Laterality: N/A;  . CARDIOVERSION N/A 05/22/2015   Procedure: CARDIOVERSION;  Surgeon: Thayer Headings, MD;  Location: [place];  Service: Cardiovascular;  Laterality: N/A;  . CARDIOVERSION N/A 10/08/2017  Procedure: CARDIOVERSION;  Surgeon: Pixie Casino, MD;  Location: [place];  Service: Cardiovascular;  Laterality: N/A;  . CARPAL TUNNEL RELEASE Left April 2015  . ELBOW / UPPER ARM FOREIGN BODY REMOVAL    . ELECTROPHYSIOLOGIC STUDY N/A 09/25/2014   Atrial fibrillation ablation performed by Dr Rayann Heman  . FLEXIBLE SIGMOIDOSCOPY N/A 08/28/2015   Procedure: FLEXIBLE SIGMOIDOSCOPY;  Surgeon: [name], MD;  Location: [facility] ENDOSCOPY;  Service:  Endoscopy;  Laterality: N/A;  . FRACTURE SURGERY Left    Left wrist  . HOT HEMOSTASIS N/A 08/28/2015   Procedure: HOT HEMOSTASIS (ARGON PLASMA COAGULATION/BICAP);  Surgeon: [name], MD;  Location: [facility];  Service: Endoscopy;  Laterality: N/A;  . LEFT AND RIGHT HEART CATHETERIZATION WITH CORONARY ANGIOGRAM N/A 03/17/2013   Procedure: LEFT AND RIGHT HEART CATHETERIZATION WITH CORONARY ANGIOGRAM;  Surgeon: Troy Sine, MD;  Location: [facility] CATH LAB;  Service: Cardiovascular;  Laterality: N/A;  . osteosynthesis Left 09/30/2012   with small plate in the ulna  . SUBXYPHOID PERICARDIAL WINDOW N/A 04/25/2013   Procedure: SUBXYPHOID PERICARDIAL WINDOW;  Surgeon: Gaye Pollack, MD;  [facility] OR;  Service: Thoracic;  Laterality: N/A;  . TEE WITHOUT CARDIOVERSION N/A 09/24/2014   Procedure: TRANSESOPHAGEAL ECHOCARDIOGRAM (TEE);  Surgeon: Dorothy Spark, MD;  Location: [facility] ENDOSCOPY;  Service: Cardiovascular;  Laterality: N/A;  . TRANSESOPHAGEAL ECHOCARDIOGRAM  08/26/2010   see Notes tab    ROS- all systems are reviewed and negatives except as per HPI above  Current Outpatient Medications  Medication Sig Dispense Refill  . atorvastatin (LIPITOR) 40 MG tablet TAKE 1 TABLET BY MOUTH EVERY EVENING 90 tablet 3  . Cholecalciferol (VITAMIN D) 2000 UNITS CAPS Take 2,000 Units by mouth daily.    Marland Kitchen diltiazem (CARDIZEM CD) 180 MG 24 hr capsule TAKE 1 CAPSULE (180 MG TOTAL) BY MOUTH DAILY. 90 capsule 1  . finasteride (PROPECIA) 1 MG tablet Take 1 mg by mouth every morning.     . furosemide (LASIX) 40 MG tablet Take 20 mg by mouth as directed. Pt is taking 1 tablet po qd    . loratadine (CLARITIN) 10 MG tablet Take 10 mg by mouth daily as needed for allergies.    . metoprolol succinate (TOPROL-XL) 25 MG 24 hr tablet Take 12.5 mg by mouth daily as needed (rapid heartrate).    . potassium chloride SA (KLOR-CON M20) 20 MEQ tablet TAKE ONE (1) TABLET (20 MEQ) BY MOUTH DAILY. 90 tablet 3  . Saw  Palmetto, Serenoa repens, (SAW PALMETTO PO) Take 900 mg by mouth 2 (two) times daily.     Marland Kitchen TIKOSYN 500 MCG capsule Take 1 capsule (500 mcg total) by mouth 2 (two) times daily. 180 capsule 3  . XARELTO 20 MG TABS tablet TAKE 1 TABLET (20 MG TOTAL) BY MOUTH DAILY WITH SUPPER. 90 tablet 2   No current facility-administered medications for this visit.     Physical Exam: Vitals:   03/28/18 1629  BP: 128/84  Pulse: 66  SpO2: 99%  Weight: 172 lb 9.6 oz (78.3 kg)  Height: 6' 1.5" (1.867 m)    GEN- The patient is well appearing, alert and oriented x 3 today.   Head- normocephalic, atraumatic Eyes-  Sclera clear, conjunctiva pink Ears- hearing intact Oropharynx- clear Lungs- Clear to ausculation bilaterally, normal work of breathing Heart- Regular rate and rhythm, no murmurs, rubs or gallops, PMI not laterally displaced GI- soft, NT, ND, + BS Extremities- no clubbing, cyanosis, or edema  Wt Readings from Last  3 Encounters:  03/28/18 172 lb 9.6 oz (78.3 kg)  10/21/17 173 lb 3.2 oz (78.6 kg)  10/08/17 175 lb 12.8 oz (79.7 kg)    EKG tracing ordered today is personally reviewed and shows sinus rhythm 66 bpm, PR 206 msec, PVCs, QTc 450 msec  Assessment and Plan:  1. Persistent afib Maintaining sinus rhythm with tikosyn, since cardioversion in July chads2vasc score is 3.  He is on xarelto Bmet, mg today  2. OSA Referred to Dr Maxwell Caul He is very pleased with treatment!  3. HTN Stable No change required today  4. CAD No ischemic symptoms No changes  5. Aortic root enlargement Echo 09/22/17/ reviewed and stable Repeat echo in June  Return in 6 months to AF clinic  Thompson Grayer MD, [facility] 03/28/2018 4:43 PM

## 2018-03-29 LAB — BASIC METABOLIC PANEL
BUN / CREAT RATIO: 19 (ref 10–24)
BUN: 19 mg/dL (ref 8–27)
CHLORIDE: 97 mmol/L (ref 96–106)
CO2: 24 mmol/L (ref 20–29)
Calcium: 9.9 mg/dL (ref 8.6–10.2)
Creatinine, Ser: 0.99 mg/dL (ref 0.76–1.27)
GFR calc Af Amer: 88 mL/min/{1.73_m2} (ref >59)
GFR calc non Af Amer: 76 mL/min/{1.73_m2} (ref >59)
Glucose: 88 mg/dL (ref 65–99)
Potassium: 4.4 mmol/L (ref 3.5–5.2)
SODIUM: 138 mmol/L (ref 134–144)

## 2018-03-29 LAB — MAGNESIUM: MAGNESIUM: 2.1 mg/dL (ref 1.6–2.3)

## 2018-05-15 ENCOUNTER — Other Ambulatory Visit: Payer: Self-pay | Admitting: Internal Medicine

## 2018-06-09 ENCOUNTER — Other Ambulatory Visit: Payer: Self-pay | Admitting: Internal Medicine

## 2018-06-13 ENCOUNTER — Other Ambulatory Visit: Payer: Self-pay | Admitting: Internal Medicine

## 2018-06-14 ENCOUNTER — Telehealth: Payer: Self-pay

## 2018-06-14 MED ORDER — DOFETILIDE 500 MCG PO CAPS: 500.0000 ug | ORAL_CAPSULE | Freq: Two times a day (BID) | ORAL | 3 refills | Status: DC

## 2018-06-14 NOTE — Telephone Encounter (Signed)
Spoke with patient with whom I have previously communicated with through a MyChart message received today. I have reviewed the patient's request to change from brand name Tikosyn to genereic dofetilide with Chanetta Marshall, NP who advised that patient may be switched and will need an ekg one week later. I reviewed this information with the patient who states he has enough medication to last until 3/22 and will switch to generic on 3/23. I scheduled him for a nurse visit on 3/30 and advised him to call back sooner with questions or concerns. I verified the pharmacy of choice and included note to request [employer] pharm to be the manufacturer of his dofetilide per patient request. He thanked me for the call.

## 2018-07-04 ENCOUNTER — Ambulatory Visit (INDEPENDENT_AMBULATORY_CARE_PROVIDER_SITE_OTHER): Payer: Medicare Other

## 2018-07-04 ENCOUNTER — Other Ambulatory Visit: Payer: Self-pay

## 2018-07-04 VITALS — BP 116/68 | HR 73 | Wt 179.8 lb

## 2018-07-04 DIAGNOSIS — I4819 Other persistent atrial fibrillation: Secondary | ICD-10-CM | POA: Diagnosis not present

## 2018-07-04 NOTE — Progress Notes (Signed)
1.) Reason for visit: EKG, switch from brand Tikosyn to generic  2.) Name of MD requesting visit: Chanetta Marshall, NP  3.) H&P: afib  4.) Assessment and plan per MD: Spoke with the patient, he has feel fine since the switch from brand to generic. Spoke with Dr. Harrington Challenger (DOD), she stated the patient was fine to continue to take.

## 2018-07-31 ENCOUNTER — Other Ambulatory Visit: Payer: Self-pay | Admitting: Internal Medicine

## 2018-09-08 ENCOUNTER — Other Ambulatory Visit: Payer: Self-pay | Admitting: Internal Medicine

## 2018-09-11 ENCOUNTER — Encounter (HOSPITAL_COMMUNITY): Payer: Self-pay

## 2018-09-12 ENCOUNTER — Telehealth: Payer: Self-pay | Admitting: Internal Medicine

## 2018-09-12 NOTE — Telephone Encounter (Signed)
Disregard opened in error °

## 2018-09-12 NOTE — Telephone Encounter (Signed)
Call received from Pt.  Pt started on [employer] which is now unavailable.  Then Pt switched to [place] dofetilide and now that is no longer available.  Advised Pt that we are aware of Pf[employer] medications being on back order.  Advised it is ok to switch to generic he would just need an EKG a week after switching.  Pt states his pharmacy will not switch.  Advised I will call his pharmacy and give them direction.  Pt wants nurse to talk to [place].  Advised nurse visit would be for July 27.  Pt anxiously double checked this nurse 2 times.  Finally agreed that date was correct.  Call placed to [facility] pharmacy on [address] with pharmacist.  Confirmed have 30 days left of last of Pfizer dofetilide.   Order placed to start Pt on generic dofetilide after that.  Pharmacist indicates understanding.  Will order generic dofetilide for Pt.    Will order nurse visit for EKG on July 27 at 12:00.

## 2018-09-16 ENCOUNTER — Telehealth (HOSPITAL_COMMUNITY): Payer: Self-pay | Admitting: Radiology

## 2018-09-16 NOTE — Telephone Encounter (Signed)

## 2018-09-19 ENCOUNTER — Ambulatory Visit (HOSPITAL_COMMUNITY): Payer: Medicare Other | Attending: Cardiovascular Disease

## 2018-09-19 ENCOUNTER — Other Ambulatory Visit: Payer: Self-pay

## 2018-09-19 DIAGNOSIS — Q2549 Other congenital malformations of aorta: Secondary | ICD-10-CM | POA: Diagnosis present

## 2018-09-22 ENCOUNTER — Other Ambulatory Visit: Payer: Self-pay

## 2018-09-22 ENCOUNTER — Ambulatory Visit (HOSPITAL_COMMUNITY)
Admission: RE | Admit: 2018-09-22 | Discharge: 2018-09-22 | Disposition: A | Discharge status: Home / Self Care | Payer: Medicare Other | Source: Ambulatory Visit | Attending: Nurse Practitioner | Admitting: Nurse Practitioner

## 2018-09-22 ENCOUNTER — Encounter (HOSPITAL_COMMUNITY): Payer: Self-pay | Admitting: Physician Assistant

## 2018-09-22 VITALS — BP 120/76 | Ht 73.5 in | Wt 176.0 lb

## 2018-09-22 DIAGNOSIS — Z79899 Other long term (current) drug therapy: Secondary | ICD-10-CM | POA: Diagnosis not present

## 2018-09-22 DIAGNOSIS — G4733 Obstructive sleep apnea (adult) (pediatric): Secondary | ICD-10-CM | POA: Diagnosis not present

## 2018-09-22 DIAGNOSIS — I4819 Other persistent atrial fibrillation: Secondary | ICD-10-CM | POA: Diagnosis not present

## 2018-09-22 DIAGNOSIS — I2510 Atherosclerotic heart disease of native coronary artery without angina pectoris: Secondary | ICD-10-CM | POA: Insufficient documentation

## 2018-09-22 DIAGNOSIS — Z881 Allergy status to other antibiotic agents status: Secondary | ICD-10-CM | POA: Diagnosis not present

## 2018-09-22 DIAGNOSIS — E785 Hyperlipidemia, unspecified: Secondary | ICD-10-CM | POA: Diagnosis not present

## 2018-09-22 DIAGNOSIS — Z8249 Family history of ischemic heart disease and other diseases of the circulatory system: Secondary | ICD-10-CM | POA: Insufficient documentation

## 2018-09-22 DIAGNOSIS — I4891 Unspecified atrial fibrillation: Secondary | ICD-10-CM | POA: Diagnosis present

## 2018-09-22 DIAGNOSIS — Z7901 Long term (current) use of anticoagulants: Secondary | ICD-10-CM | POA: Diagnosis not present

## 2018-09-22 DIAGNOSIS — I10 Essential (primary) hypertension: Secondary | ICD-10-CM | POA: Diagnosis not present

## 2018-09-22 DIAGNOSIS — Z87891 Personal history of nicotine dependence: Secondary | ICD-10-CM | POA: Diagnosis not present

## 2018-09-22 DIAGNOSIS — I7789 Other specified disorders of arteries and arterioles: Secondary | ICD-10-CM | POA: Diagnosis not present

## 2018-09-22 DIAGNOSIS — I313 Pericardial effusion (noninflammatory): Secondary | ICD-10-CM | POA: Diagnosis not present

## 2018-09-22 DIAGNOSIS — I429 Cardiomyopathy, unspecified: Secondary | ICD-10-CM | POA: Diagnosis not present

## 2018-09-22 DIAGNOSIS — Z823 Family history of stroke: Secondary | ICD-10-CM | POA: Insufficient documentation

## 2018-09-23 NOTE — Progress Notes (Signed)
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 Patient has done well since his last visit with no symptoms of heart racing or palpitations. He is very satisfied with his present therapy. No bleeding issues with anticoagulation.   Today, he denies symptoms of palpitations, chest pain, shortness of breath, orthopnea, PND, lower extremity edema, dizziness, presyncope, syncope, or neurologic sequela. Mild fatigue. The patient is tolerating medications without difficulties and is otherwise without complaint today.   Past Medical History:  Diagnosis Date  . Cardiomyopathy ([place])    a. 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 b. also hx of tachycardia mediated CM, now resolved  . Complication of anesthesia    Pt has anxiety issues and thinks he is going to die.  . Depression   . Echocardiogram abnormal    EF60%, mild LVH, PA pk pressure 35, pericardial effusion mild increase from 2012   . H/O cardiac catheterization    no significant CAD by cath 5/12  . Hyperlipidemia   . Hypertension   . Limb pain 06/20/2007   LE venous doppler - no evidence of thrombus or thrombophlebitis  . Liver mass    a. CT 03/2013: Small probable cysts in the liver and small hyperenhancing liver mass.  . OSA (obstructive sleep apnea)    AHI avg 5/hr  . Paroxysmal atrial fibrillation (HCC)    a. s/p afib ablations 2002 and 2009 by Dr Rolland Porter. b. s/p DCCV 02/2013. c. s/p DCCV 01/19/2014  . Pericardial effusion    a. Since 2007. b. s/p pericardial window 04/2013.  Marland Kitchen PVC (premature ventricular contraction) 12/18/2009   cardionet monitor 21 days - some PVCs, ventricular bigeminy  . Thoracic aortic aneurysm ([place])    a. Borderline enlarged by CT 12/14 at 4.0cm.   Past Surgical History:  Procedure Laterality Date  .  CARDIAC CATHETERIZATION  08/25/10   NO SIGNIFICANT CAD  . CARDIAC ELECTROPHYSIOLOGY MAPPING AND ABLATION  2001,2009   Dr. Tally Due  . CARDIOVASCULAR STRESS TEST  09/08/2005   R/S MV - EF 61%; Exercises capacity 12 Mets; essentially normal perfusion myocardial scan    . CARDIOVERSION  10/18/2007   successful  . CARDIOVERSION N/A 02/08/2013   Procedure: CARDIOVERSION;  Surgeon: Troy Sine, MD;  Location: [place];  Service: Cardiovascular;  Laterality: N/A;  . CARDIOVERSION N/A 08/17/2013   Procedure: CARDIOVERSION;  Surgeon: Sanda Klein, MD;  Location: [place];  Service: Cardiovascular;  Laterality: N/A;  . CARDIOVERSION N/A 12/28/2013   Procedure: CARDIOVERSION;  Surgeon: Fay Records, MD;  Location: [place];  Service: Cardiovascular;  Laterality: N/A;  . CARDIOVERSION N/A 01/19/2014   Procedure: CARDIOVERSION;  Surgeon: Sanda Klein, MD;  Location: [facility] ENDOSCOPY;  Service: Cardiovascular;  Laterality: N/A;  . CARDIOVERSION N/A 03/11/2014   Procedure: CARDIOVERSION;  Surgeon: Evans Lance, MD;  Location: [place];  Service: Cardiovascular;  Laterality: N/A;  . CARDIOVERSION N/A 08/23/2014   Procedure: CARDIOVERSION;  Surgeon: Lelon Perla, MD;  Location: [facility] ENDOSCOPY;  Service: Cardiovascular;  Laterality: N/A;  . CARDIOVERSION N/A 10/04/2014   Procedure: CARDIOVERSION;  Surgeon: Sueanne Margarita, MD;  Location: [facility] ENDOSCOPY;  Service: Cardiovascular;  Laterality: N/A;  . CARDIOVERSION N/A 05/22/2015   Procedure: CARDIOVERSION;  Surgeon: Thayer Headings, MD;  Location: [place];  Service:  Cardiovascular;  Laterality: N/A;  . CARDIOVERSION N/A 10/08/2017   Procedure: CARDIOVERSION;  Surgeon: Pixie Casino, MD;  Location: [place];  Service: Cardiovascular;  Laterality: N/A;  . CARPAL TUNNEL RELEASE Left April 2015  . ELBOW / UPPER ARM FOREIGN BODY REMOVAL    . ELECTROPHYSIOLOGIC STUDY N/A 09/25/2014   Atrial fibrillation ablation performed by Dr Rayann Heman  .  FLEXIBLE SIGMOIDOSCOPY N/A 08/28/2015   Procedure: FLEXIBLE SIGMOIDOSCOPY;  Surgeon: [name], MD;  Location: [facility] ENDOSCOPY;  Service: Endoscopy;  Laterality: N/A;  . FRACTURE SURGERY Left    Left wrist  . HOT HEMOSTASIS N/A 08/28/2015   Procedure: HOT HEMOSTASIS (ARGON PLASMA COAGULATION/BICAP);  Surgeon: [name], MD;  Location: [facility] ENDOS[facility];  Service: Endoscopy;  Laterality: N/A;  . LEFT AND RIGHT HEART CATHETERIZATION WITH CORONARY ANGIOGRAM N/A 03/17/2013   Procedure: LEFT AND RIGHT HEART CATHETERIZATION WITH CORONARY ANGIOGRAM;  Surgeon: Troy Sine, MD;  Location: [facility] CATH LAB;  Service: Cardiovascular;  Laterality: N/A;  . osteosynthesis Left 09/30/2012   with small plate in the ulna  . SUBXYPHOID PERICARDIAL WINDOW N/A 04/25/2013   Procedure: SUBXYPHOID PERICARDIAL WINDOW;  Surgeon: Gaye Pollack, MD;  Location: [facility] OR;  Service: Thoracic;  Laterality: N/A;  . TEE WITHOUT CARDIOVERSION N/A 09/24/2014   Procedure: TRANSESOPHAGEAL ECHOCARDIOGRAM (TEE);  Surgeon: Dorothy Spark, MD;  Location: [facility] ENDOSCOPY;  Service: Cardiovascular;  Laterality: N/A;  . TRANSESOPHAGEAL ECHOCARDIOGRAM  08/26/2010   see Notes tab    Current Outpatient Medications  Medication Sig Dispense Refill  . atorvastatin (LIPITOR) 40 MG tablet TAKE 1 TABLET BY MOUTH EVERY EVENING 90 tablet 1  . Cholecalciferol (VITAMIN D) 2000 UNITS CAPS Take 2,000 Units by mouth daily.    Marland Kitchen diltiazem (CARDIZEM CD) 180 MG 24 hr capsule TAKE 1 CAPSULE (180 MG TOTAL) BY MOUTH DAILY. 90 capsule 2  . dofetilide (TIKOSYN) 500 MCG capsule Take 1 capsule (500 mcg total) by mouth 2 (two) times daily. 180 capsule 3  . finasteride (PROPECIA) 1 MG tablet Take 1 mg by mouth every morning.     . furosemide (LASIX) 40 MG tablet Take 0.5 tablets (20 mg total) by mouth daily. 45 tablet 3  . loratadine (CLARITIN) 10 MG tablet Take 10 mg by mouth daily as needed for allergies.    . potassium chloride SA (KLOR-CON M20) 20 MEQ tablet  TAKE ONE (1) TABLET (20 MEQ) BY MOUTH DAILY. 90 tablet 1  . Saw Palmetto, Serenoa repens, (SAW PALMETTO PO) Take 900 mg by mouth 2 (two) times daily.     Alveda Reasons 20 MG TABS tablet TAKE 1 TABLET (20 MG TOTAL) BY MOUTH DAILY WITH SUPPER. 90 tablet 2  . metoprolol succinate (TOPROL-XL) 25 MG 24 hr tablet Take 12.5 mg by mouth daily as needed (rapid heartrate).     No current facility-administered medications for this encounter.     Allergies  Allergen Reactions  . Tetracyclines & Related Hives    rash    Social History   Socioeconomic History  . Marital status: Single    Spouse name: Not on file  . Number of children: 0  . Years of education: Not on file  . Highest education level: Not on file  Occupational History    Employer: [employer]  . Financial resource strain: Not on file  . Food insecurity    Worry: Not on file    Inability: Not on file  . Transportation needs    Medical: Not on  file    Non-medical: Not on file  Tobacco Use  . Smoking status: Former Smoker    Packs/day: 1.00    Years: 10.00    Pack years: 10.00    Quit date: 02/06/1989    Years since quitting: 29.6  . Smokeless tobacco: Never Used  Substance and Sexual Activity  . Alcohol use: No    Comment: 2 bottles of wine per week, pt states has not drank alcohol since christmas  . Drug use: No  . Sexual activity: Not on file  Lifestyle  . Physical activity    Days per week: Not on file    Minutes per session: Not on file  . Stress: Not on file  Relationships  . Social Herbalist on phone: Not on file    Gets together: Not on file    Attends religious service: Not on file    Active member of club or organization: Not on file    Attends meetings of clubs or organizations: Not on file    Relationship status: Not on file  . Intimate partner violence    Fear of current or ex partner: Not on file    Emotionally abused: Not on file    Physically abused: Not on file     Forced sexual activity: Not on file  Other Topics Concern  . 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    Family History  Problem Relation Age of Onset  . Coronary artery disease Father   . Heart attack Father   . Stroke Mother        died  . Atrial fibrillation Brother   . Mitral valve prolapse Sister     ROS- All systems are reviewed and negative except as per the HPI above  Physical Exam: Vitals:   09/22/18 1542  BP: 120/76  Weight: 79.8 kg  Height: 6' 1.5" (1.867 m)   Wt Readings from Last 3 Encounters:  09/22/18 79.8 kg  07/04/18 81.6 kg  03/28/18 78.3 kg    Labs: Lab Results  Component Value Date   NA 138 03/28/2018   K 4.4 03/28/2018   CL 97 03/28/2018   CO2 24 03/28/2018   GLUCOSE 88 03/28/2018   BUN 19 03/28/2018   CREATININE 0.99 03/28/2018   CALCIUM 9.9 03/28/2018   MG 2.1 03/28/2018   Lab Results  Component Value Date   INR 0.90 04/24/2013   Lab Results  Component Value Date   CHOL  08/23/2007    188        ATP III CLASSIFICATION:  <200     mg/dL   Desirable  200-239  mg/dL   Borderline High  >=240    mg/dL   High   HDL 47 08/23/2007   LDLCALC (H) 08/23/2007    128        Total Cholesterol/HDL:CHD Risk Coronary Heart Disease Risk Table                     Men   Women  1/2 Average Risk   3.4   3.3   TRIG 63 08/23/2007    GEN- The patient is well appearing, alert and oriented x 3 today.   HEENT-head normocephalic, atraumatic, sclera clear, conjunctiva  pink, hearing intact, trachea midline. Lungs- Clear to ausculation bilaterally, normal work of breathing Heart- Regular rate and rhythm, no murmurs, rubs or gallops  GI- soft, NT, ND, + BS Extremities- no clubbing, cyanosis, or edema MS- no significant deformity or atrophy Skin- no rash or lesion  Psych- euthymic mood, full affect Neuro- strength and sensation are intact   EKG- SR HR 79, PVC, PR 198, QRS 94, QTc 449  Echo 09/19/18 There is a small to moderate pericardial effusion moslty lateral to the RV with no tamponade suggest f/u echo in 6-8 weeks 1. The left ventricle has normal systolic function with an ejection fraction of 60-65%. The cavity size was normal. There is mildly increased left ventricular wall thickness. Left ventricular diastolic parameters were normal.  2. The right ventricle has normal systolic function. The cavity was normal. There is no increase in right ventricular wall thickness.  3. Mild thickening of the mitral valve leaflet.  4. Mild thickening of the aortic valve. Aortic valve regurgitation is trivial by color flow Doppler.  5. There is mild dilatation of the aortic root measuring 39 mm.  Assessment and Plan: 1. Persistent afib  Patient appears to be maintaining SR. Continue Tikosyn 500 mcg bid, QT stable. Continue diltiazem 180 mg daily Labs recently at PCP, will request records.  2. Pericardial effusion S/p pericardial window 2015. Repeat echo shows mild/mod pericardial effusion w/o tamponade.  No symptoms of CHF Will recheck echo in 6-8 weeks.  3. OSA Encouraged compliance with CPAP  4. HTN Stable, no change today.  5. CAD No anginal symptoms. Continue present therapy.  6. Aortic root enlargement. Stable by last echo.  F/u with Dr. Rayann Heman in 6-8 weeks.  [facility] [address] [place], [place] [address] [phone]

## 2018-10-01 ENCOUNTER — Other Ambulatory Visit: Payer: Self-pay | Admitting: Internal Medicine

## 2018-10-03 NOTE — Telephone Encounter (Signed)
Xarelto 20mg  refill request received; pt is 73 yrs old, Wt-78.3kg, Crea-0.99 on 03/28/2018, last seen by Roderic Palau on 09/22/2018, CrCl-74.54ml/min; will send in the refill to requested pharmacy.

## 2018-10-31 ENCOUNTER — Ambulatory Visit (INDEPENDENT_AMBULATORY_CARE_PROVIDER_SITE_OTHER): Payer: Medicare Other | Admitting: *Deleted

## 2018-10-31 ENCOUNTER — Other Ambulatory Visit: Payer: Self-pay

## 2018-10-31 VITALS — BP 126/82 | HR 70 | Wt 172.8 lb

## 2018-10-31 DIAGNOSIS — I4819 Other persistent atrial fibrillation: Secondary | ICD-10-CM

## 2018-10-31 NOTE — Patient Instructions (Addendum)
Medication Instructions:   Your physician recommends that you continue on your current medications as directed. Please refer to the Current Medication list given to you today.  *If you need a refill on your cardiac medications before your next appointment, please call your pharmacy*   Follow-Up:  As planned  

## 2018-10-31 NOTE — Progress Notes (Signed)
1.) Reason for visit: EKG for one week on generic Tikosyn  2.) Name of MD requesting visit: Dr. Rayann Heman  3.) H&P: Persistent atrial fibrillation  4.) ROS related to problem: Pt is here today for an EKG per Dr. Rayann Heman, for start of generic dofetilide over 1 week ago.  Pts BP-126/82 HR-70.  Pt is doing well today with no complaints of afib.  EKG done and showed sinus rhythm with occasional PVCs.  Showed EKG to DOD Dr Tamala Julian who reviewed and signed off on EKG.  No changes to be made, and the pt was instructed to continue his current med regimen. Will have pts EKG scanned into Epic for Dr. Rayann Heman to review.  5.) Assessment and plan per MD: EKG reviewed by DOD Dr Tamala Julian.  No changes to be made and pt should continue on his current med regimen.

## 2018-11-09 ENCOUNTER — Telehealth: Payer: Self-pay

## 2018-11-09 NOTE — Telephone Encounter (Signed)
Patient with diagnosis of afib on Xarelto for anticoagulation.    Procedure: colonoscopy Date of procedure: 12/15/2018  CHADS2-VASc score of  3 (CHF, HTN, AGE)  CrCl 75ml/min  Per office protocol, patient can hold Xarelto for 1 day prior to procedure.

## 2018-11-09 NOTE — Telephone Encounter (Signed)
   [facility] Pre-operative Risk Assessment    Request for surgical clearance:  1. What type of surgery is being performed? Colonoscopy   2. When is this surgery scheduled? 12/15/18   3. What type of clearance is required (medical clearance vs. Pharmacy clearance to hold med vs. Both)? Pharmacy  4. Are there any medications that need to be held prior to surgery and how long? Xarelto   5. Practice name and name of physician performing surgery? [facility]/Dr. [name]   6. What is your office phone number [phone]    7.   What is your office fax number [phone]  8.   Anesthesia type (None, local, MAC, general) ? Not listed   [name] [name] 11/09/2018, 4:09 PM  _________________________________________________________________   (provider comments below)

## 2018-11-09 NOTE — Telephone Encounter (Signed)
   Primary Cardiologist: Thompson Grayer, MD  Chart reviewed as part of pre-operative protocol coverage. Per pharmacy recommendations, patient can hold xarelto for 1 day prior to his upcoming colonoscopy.  He should restart this medication when cleared to do so by Dr. Michail Sermon.   I will route this recommendation to the requesting party via Epic fax function and remove from pre-op pool.  Please call with questions.  Abigail Butts, PA-C 11/09/2018, 10:34 PM

## 2018-11-26 ENCOUNTER — Other Ambulatory Visit: Payer: Self-pay | Admitting: Internal Medicine

## 2019-01-11 ENCOUNTER — Telehealth: Payer: Self-pay

## 2019-01-11 NOTE — Telephone Encounter (Signed)
Call placed to HT.  This is ANOTHER change to a different generic provider.  Will see if Dr. Rayann Heman would like another EKG s/p changing to another generic carrier.

## 2019-01-11 NOTE — Telephone Encounter (Signed)
Received fax from [facility], patient received a new Chief Strategy Officer, [employer]. Kristopher Oppenheim was checking to see if patient needed an EKG since manufacturer has changed.

## 2019-01-13 NOTE — Telephone Encounter (Signed)
Please bring in for nurse visit for EKG after medicine change

## 2019-01-20 ENCOUNTER — Encounter: Payer: Self-pay | Admitting: Internal Medicine

## 2019-01-20 ENCOUNTER — Telehealth: Payer: Self-pay

## 2019-01-20 ENCOUNTER — Telehealth (INDEPENDENT_AMBULATORY_CARE_PROVIDER_SITE_OTHER): Payer: Medicare Other | Admitting: Internal Medicine

## 2019-01-20 VITALS — Ht 73.5 in | Wt 170.0 lb

## 2019-01-20 DIAGNOSIS — I4819 Other persistent atrial fibrillation: Secondary | ICD-10-CM | POA: Diagnosis not present

## 2019-01-20 DIAGNOSIS — I313 Pericardial effusion (noninflammatory): Secondary | ICD-10-CM

## 2019-01-20 DIAGNOSIS — I10 Essential (primary) hypertension: Secondary | ICD-10-CM | POA: Diagnosis not present

## 2019-01-20 DIAGNOSIS — I3139 Other pericardial effusion (noninflammatory): Secondary | ICD-10-CM

## 2019-01-20 DIAGNOSIS — G4733 Obstructive sleep apnea (adult) (pediatric): Secondary | ICD-10-CM

## 2019-01-20 NOTE — Progress Notes (Signed)
Electrophysiology TeleHealth Note   Due to national recommendations of social distancing due to COVID 19, an audio/video telehealth visit is felt to be most appropriate for this patient at this time.  See MyChart message from today for the patient's consent to telehealth for [facility].   Date:  01/20/2019   ID:  [name] [name], DOB 11/29/1945, MRN [id number]  Location: patient's home  Provider location:  [facility]  Evaluation Performed: Follow-up visit  PCP:  [name], MD   Electrophysiologist:  Dr Rayann Heman  Chief Complaint:  palpitations  History of Present Illness:    [name] [name] is a 73 y.o. male who presents via telehealth conferencing today.  Since last being seen in our clinic, the patient reports doing very well.  Today, he denies symptoms of palpitations, chest pain, shortness of breath,  lower extremity edema, dizziness, presyncope, or syncope.  The patient is otherwise without complaint today.  The patient denies symptoms of fevers, chills, cough, or new SOB worrisome for COVID 19.  Past Medical History:  Diagnosis Date  . Cardiomyopathy ([place])    a. 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 b. also hx of tachycardia mediated CM, now resolved  . Complication of anesthesia    Pt has anxiety issues and thinks he is going to die.  . Depression   . Echocardiogram abnormal    EF60%, mild LVH, PA pk pressure 35, pericardial effusion mild increase from 2012   . H/O cardiac catheterization    no significant CAD by cath 5/12  . Hyperlipidemia   . Hypertension   . Limb pain 06/20/2007   LE venous doppler - no evidence of thrombus or thrombophlebitis  . Liver mass    a. CT 03/2013: Small probable cysts in the liver and small hyperenhancing liver mass.  . OSA (obstructive sleep apnea)    AHI avg 5/hr  . Paroxysmal atrial fibrillation (HCC)    a. s/p afib ablations 2002 and 2009 by Dr Rolland Porter. b. s/p DCCV 02/2013. c. s/p DCCV 01/19/2014   . Pericardial effusion    a. Since 2007. b. s/p pericardial window 04/2013.  Marland Kitchen PVC (premature ventricular contraction) 12/18/2009   cardionet monitor 21 days - some PVCs, ventricular bigeminy  . Thoracic aortic aneurysm ([place])    a. Borderline enlarged by CT 12/14 at 4.0cm.    Past Surgical History:  Procedure Laterality Date  . CARDIAC CATHETERIZATION  08/25/10   NO SIGNIFICANT CAD  . CARDIAC ELECTROPHYSIOLOGY MAPPING AND ABLATION  2001,2009   Dr. Tally Due  . CARDIOVASCULAR STRESS TEST  09/08/2005   R/S MV - EF 61%; Exercises capacity 12 Mets; essentially normal perfusion myocardial scan    . CARDIOVERSION  10/18/2007   successful  . CARDIOVERSION N/A 02/08/2013   Procedure: CARDIOVERSION;  Surgeon: Troy Sine, MD;  Location: [place];  Service: Cardiovascular;  Laterality: N/A;  . CARDIOVERSION N/A 08/17/2013   Procedure: CARDIOVERSION;  Surgeon: Sanda Klein, MD;  Location: [place] ENDOSCOPY;  Service: Cardiovascular;  Laterality: N/A;  . CARDIOVERSION N/A 12/28/2013   Procedure: CARDIOVERSION;  Surgeon: Fay Records, MD;  Location[facility] ENDOSCOPY;  Service: Cardiovascular;  Laterality: N/A;  . CARDIOVERSION N/A 01/19/2014   Procedure: CARDIOVERSION;  Surgeon: Sanda Klein, MD;  Location: [facility] ENDOSCOPY;  Service: Cardiovascular;  Laterality: N/A;  . CARDIOVERSION N/A 03/11/2014   Procedure: CARDIOVERSION;  Surgeon: Evans Lance, MD;  Location: [place];  Service: Cardiovascular;  Laterality: N/A;  .  CARDIOVERSION N/A 08/23/2014   Procedure: CARDIOVERSION;  Surgeon: Lelon Perla, MD;  Location: [facility] ENDOSCOPY;  Service: Cardiovascular;  Laterality: N/A;  . CARDIOVERSION N/A 10/04/2014   Procedure: CARDIOVERSION;  Surgeon: Sueanne Margarita, MD;  Location: [facility] ENDOSCOPY;  Service: Cardiovascular;  Laterality: N/A;  . CARDIOVERSION N/A 05/22/2015   Procedure: CARDIOVERSION;  Surgeon: Thayer Headings, MD;  Location: [facility] ENDOSCOPY;  Service: Cardiovascular;  Laterality: N/A;  . CARDIOVERSION  N/A 10/08/2017   Procedure: CARDIOVERSION;  Surgeon: Pixie Casino, MD;  Location: [facility] ENDOSCOPY;  Service: Cardiovascular;  Laterality: N/A;  . CARPAL TUNNEL RELEASE Left April 2015  . ELBOW / UPPER ARM FOREIGN BODY REMOVAL    . ELECTROPHYSIOLOGIC STUDY N/A 09/25/2014   Atrial fibrillation ablation performed by Dr Rayann Heman  . FLEXIBLE SIGMOIDOSCOPY N/A 08/28/2015   Procedure: FLEXIBLE SIGMOIDOSCOPY;  Surgeon: [name], MD;  Location: [facility] ENDOSCOPY;  Service: Endoscopy;  Laterality: N/A;  . FRACTURE SURGERY Left    Left wrist  . HOT HEMOSTASIS N/A 08/28/2015   Procedure: HOT HEMOSTASIS (ARGON PLASMA COAGULATION/BICAP);  Surgeon: [name], MD;  Location: [facility];  Service: Endoscopy;  Laterality: N/A;  . LEFT AND RIGHT HEART CATHETERIZATION WITH CORONARY ANGIOGRAM N/A 03/17/2013   Procedure: LEFT AND RIGHT HEART CATHETERIZATION WITH CORONARY ANGIOGRAM;  Surgeon: Troy Sine, MD;  Location: [facility] CATH LAB;  Service: Cardiovascular;  Laterality: N/A;  . osteosynthesis Left 09/30/2012   with small plate in the ulna  . SUBXYPHOID PERICARDIAL WINDOW N/A 04/25/2013   Procedure: SUBXYPHOID PERICARDIAL WINDOW;  Surgeon: Gaye Pollack, MD;  Location: [facility] OR;  Service: Thoracic;  Laterality: N/A;  . TEE WITHOUT CARDIOVERSION N/A 09/24/2014   Procedure: TRANSESOPHAGEAL ECHOCARDIOGRAM (TEE);  Surgeon: Dorothy Spark, MD;  Location: [facility] ENDOSCOPY;  Service: Cardiovascular;  Laterality: N/A;  . TRANSESOPHAGEAL ECHOCARDIOGRAM  08/26/2010   see Notes tab    Current Outpatient Medications  Medication Sig Dispense Refill  . atorvastatin (LIPITOR) 40 MG tablet TAKE 1 TABLET BY MOUTH EVERY DAY IN THE EVENING 90 tablet 1  . Cholecalciferol (VITAMIN D) 2000 UNITS CAPS Take 2,000 Units by mouth daily.    Marland Kitchen diltiazem (CARDIZEM CD) 180 MG 24 hr capsule TAKE 1 CAPSULE BY MOUTH EVERY DAY 90 capsule 2  . dofetilide (TIKOSYN) 500 MCG capsule Take 1 capsule (500 mcg total) by mouth 2 (two) times daily.  180 capsule 3  . finasteride (PROPECIA) 1 MG tablet Take 1 mg by mouth every morning.     . furosemide (LASIX) 40 MG tablet Take 0.5 tablets (20 mg total) by mouth daily. 45 tablet 3  . metoprolol succinate (TOPROL-XL) 25 MG 24 hr tablet Take 12.5 mg by mouth daily as needed (rapid heartrate).    . potassium chloride SA (KLOR-CON M20) 20 MEQ tablet TAKE ONE (1) TABLET (20 MEQ) BY MOUTH DAILY. 90 tablet 1  . Saw Palmetto, Serenoa repens, (SAW PALMETTO PO) Take 900 mg by mouth 2 (two) times daily.     Alveda Reasons 20 MG TABS tablet TAKE 1 TABLET (20 MG TOTAL) BY MOUTH DAILY WITH SUPPER. 90 tablet 2   No current facility-administered medications for this visit.     Allergies:   Tetracyclines & related   Social History:  The patient  reports that he quit smoking about 29 years ago. He has a 10.00 pack-year smoking history. He has never used smokeless tobacco. He reports that he does not drink alcohol or use drugs.   Family History:  The patient's family  history includes Atrial fibrillation in his brother; Coronary artery disease in his father; Heart attack in his father; Mitral valve prolapse in his sister; Stroke in his mother.   ROS:  Please see the history of present illness.   All other systems are personally reviewed and negative.    Exam:    Vital Signs:  Ht 6' 1.5" (1.867 m)   Wt 170 lb (77.1 kg)   BMI 22.12 kg/m   Well sounding and appearing, alert and conversant, regular work of breathing,  good skin color Eyes- anicteric, neuro- grossly intact, skin- no apparent rash or lesions or cyanosis, mouth- oral mucosa is pink  Labs/Other Tests and Data Reviewed:    Recent Labs: 03/28/2018: BUN 19; Creatinine, Ser 0.99; Magnesium 2.1; Potassium 4.4; Sodium 138   Wt Readings from Last 3 Encounters:  01/20/19 170 lb (77.1 kg)  10/31/18 172 lb 12.8 oz (78.4 kg)  09/22/18 176 lb (79.8 kg)     ASSESSMENT & PLAN:    1.  Persistent atrial fibrillation maitaining sinus with tikosyn Labs  from PCP 09/2018 reviewed ekg withiin the next 2 weeks.  2. Pericardiac effusion Noted on echo in June. Will order limited echo to evaluate his effusion.  Hopefully it has resolved or at least not worsened.  3. CAD No ischemic symptoms  4. HTN "supurb for years" per patient No change required today  5. OSA Uses CPAP  6. Aortic root enlargement Stable No change required today   Follow-up:  6 months with me   Patient Risk:  after full review of this patients clinical status, I feel that they are at moderate risk at this time.  Today, I have spent 15 minutes with the patient with telehealth technology discussing arrhythmia management .    SignedThompson Grayer, MD  01/20/2019 1:55 PM     [place] [address] [phone] (office) [phone] (fax)

## 2019-01-20 NOTE — Telephone Encounter (Signed)
-----   Message from Thompson Grayer, MD sent at 01/20/2019  2:11 PM EDT ----- Needs limited echo to evaluate pericardial effusion Previously ordered in AF clinic but he prefers to do in our office.   He is coming in for an ekg 01/27/2019 at 2pm.  Could we do the echo at the same time?   Follow-up with me in 6 months

## 2019-01-27 ENCOUNTER — Ambulatory Visit (HOSPITAL_COMMUNITY): Payer: Medicare Other | Attending: Cardiology

## 2019-01-27 ENCOUNTER — Other Ambulatory Visit: Payer: Self-pay

## 2019-01-27 ENCOUNTER — Ambulatory Visit (INDEPENDENT_AMBULATORY_CARE_PROVIDER_SITE_OTHER): Payer: Medicare Other

## 2019-01-27 VITALS — BP 118/74 | HR 73 | Ht 73.5 in | Wt 170.8 lb

## 2019-01-27 DIAGNOSIS — I4819 Other persistent atrial fibrillation: Secondary | ICD-10-CM

## 2019-01-27 DIAGNOSIS — I3139 Other pericardial effusion (noninflammatory): Secondary | ICD-10-CM

## 2019-01-27 DIAGNOSIS — I313 Pericardial effusion (noninflammatory): Secondary | ICD-10-CM | POA: Insufficient documentation

## 2019-01-27 LAB — ECHOCARDIOGRAM LIMITED
Height: 73.5 in
Weight: 2732.8 oz

## 2019-01-27 NOTE — Patient Instructions (Signed)
Medication Instructions:  NO CHANGES WITH MEDICATIONS WERE MADE TODAY *If you need a refill on your cardiac medications before your next appointment, please call your pharmacy*  Lab Work: NONE ORDERED TODAY If you have labs (blood work) drawn today and your tests are completely normal, you will receive your results only by: Marland Kitchen MyChart Message (if you have MyChart) OR . A paper copy in the mail If you have any lab test that is abnormal or we need to change your treatment, we will call you to review the results.  Testing/Procedures: NONE ORDERED TODAY  Follow-Up: At [facility], you and your health needs are our priority.  As part of our continuing mission to provide you with exceptional heart care, we have created designated Provider Care Teams.  These Care Teams include your primary Cardiologist (physician) and Advanced Practice Providers (APPs -  Physician Assistants and Nurse Practitioners) who all work together to provide you with the care you need, when you need it.  Your next appointment:   6 months WITH DR. ALLRED July 10, 2019  The format for your next appointment:   In Person       Other Instructions PER DR. KLEIN NO CHANGES TO BE MADE. WE WILL FORWARD YOUR EKG TO DR. ALLRED. WE WILL CALL YOU IF DR. ALLRED HAS ANY NEW RECOMMENDATIONS.

## 2019-01-27 NOTE — Progress Notes (Signed)
Pt came in for EKG for his Tikosyn and an Echo..   EKG taken to Dr. Caryl Comes and pt advised no changes and will forward to Dr. Rayann Heman for review.

## 2019-03-18 ENCOUNTER — Other Ambulatory Visit: Payer: Self-pay | Admitting: Internal Medicine

## 2019-03-20 NOTE — Telephone Encounter (Signed)
Ok to renew?  

## 2019-05-21 ENCOUNTER — Other Ambulatory Visit: Payer: Self-pay | Admitting: Internal Medicine

## 2019-05-22 ENCOUNTER — Other Ambulatory Visit (HOSPITAL_COMMUNITY): Payer: Self-pay | Admitting: *Deleted

## 2019-05-22 MED ORDER — FUROSEMIDE 40 MG PO TABS: 20.0000 mg | ORAL_TABLET | Freq: Every day | ORAL | 3 refills | Status: DC

## 2019-05-22 NOTE — Telephone Encounter (Signed)
Pt last saw Dr Rayann Heman 01/20/19 video visit Covid-19, last labs 09/19/18 Creat 0.89, age 74, weight 77.5kg, CrCl 82.24, based on CrCl pt is on appropriate dosage of Xarelto 20mg  QD.  Will refill rx.

## 2019-07-04 ENCOUNTER — Other Ambulatory Visit: Payer: Self-pay | Admitting: Internal Medicine

## 2019-07-07 ENCOUNTER — Telehealth: Payer: Self-pay

## 2019-07-10 ENCOUNTER — Other Ambulatory Visit: Payer: Self-pay

## 2019-07-10 ENCOUNTER — Telehealth (INDEPENDENT_AMBULATORY_CARE_PROVIDER_SITE_OTHER): Payer: Medicare PPO | Admitting: Internal Medicine

## 2019-07-10 ENCOUNTER — Encounter: Payer: Self-pay | Admitting: Internal Medicine

## 2019-07-10 ENCOUNTER — Other Ambulatory Visit (HOSPITAL_COMMUNITY): Payer: Self-pay | Admitting: *Deleted

## 2019-07-10 VITALS — BP 130/80 | HR 72 | Ht 73.5 in | Wt 173.0 lb

## 2019-07-10 DIAGNOSIS — Q2549 Other congenital malformations of aorta: Secondary | ICD-10-CM | POA: Diagnosis not present

## 2019-07-10 DIAGNOSIS — I3139 Other pericardial effusion (noninflammatory): Secondary | ICD-10-CM

## 2019-07-10 DIAGNOSIS — I313 Pericardial effusion (noninflammatory): Secondary | ICD-10-CM | POA: Diagnosis not present

## 2019-07-10 DIAGNOSIS — I4819 Other persistent atrial fibrillation: Secondary | ICD-10-CM

## 2019-07-10 DIAGNOSIS — G4733 Obstructive sleep apnea (adult) (pediatric): Secondary | ICD-10-CM

## 2019-07-10 DIAGNOSIS — I10 Essential (primary) hypertension: Secondary | ICD-10-CM | POA: Diagnosis not present

## 2019-07-10 DIAGNOSIS — D6869 Other thrombophilia: Secondary | ICD-10-CM

## 2019-07-10 NOTE — Progress Notes (Signed)
Electrophysiology TeleHealth Note   Due to national recommendations of social distancing due to COVID 19, an audio/video telehealth visit is felt to be most appropriate for this patient at this time.  See MyChart message from today for the patient's consent to telehealth for [facility].  Date:  07/10/2019   ID:  [name] [name], DOB 1945/08/20, MRN [id number]  Location: patient's home  Provider location:  [facility]  Evaluation Performed: Follow-up visit  PCP:  [name], MD   Electrophysiologist:  Dr Rayann Heman  Chief Complaint:  palpitations  History of Present Illness:    [name] [name] is a 74 y.o. male who presents via telehealth conferencing today.  Since last being seen in our clinic, the patient reports doing very well.  Today, he denies symptoms of palpitations, chest pain, shortness of breath,  lower extremity edema, dizziness, presyncope, or syncope.  The patient is otherwise without complaint today.  The patient denies symptoms of fevers, chills, cough, or new SOB worrisome for COVID 19.  Past Medical History:  Diagnosis Date  . Cardiomyopathy ([place])    a. 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 b. also hx of tachycardia mediated CM, now resolved  . Complication of anesthesia    Pt has anxiety issues and thinks he is going to die.  . Depression   . Echocardiogram abnormal    EF60%, mild LVH, PA pk pressure 35, pericardial effusion mild increase from 2012   . H/O cardiac catheterization    no significant CAD by cath 5/12  . Hyperlipidemia   . Hypertension   . Limb pain 06/20/2007   LE venous doppler - no evidence of thrombus or thrombophlebitis  . Liver mass    a. CT 03/2013: Small probable cysts in the liver and small hyperenhancing liver mass.  . OSA (obstructive sleep apnea)    AHI avg 5/hr  . Paroxysmal atrial fibrillation (HCC)    a. s/p afib ablations 2002 and 2009 by Dr Rolland Porter. b. s/p DCCV 02/2013. c. s/p DCCV 01/19/2014  .  Pericardial effusion    a. Since 2007. b. s/p pericardial window 04/2013.  Marland Kitchen PVC (premature ventricular contraction) 12/18/2009   cardionet monitor 21 days - some PVCs, ventricular bigeminy  . Thoracic aortic aneurysm ([place])    a. Borderline enlarged by CT 12/14 at 4.0cm.    Past Surgical History:  Procedure Laterality Date  . CARDIAC CATHETERIZATION  08/25/10   NO SIGNIFICANT CAD  . CARDIAC ELECTROPHYSIOLOGY MAPPING AND ABLATION  2001,2009   Dr. Tally Due  . CARDIOVASCULAR STRESS TEST  09/08/2005   R/S MV - EF 61%; Exercises capacity 12 Mets; essentially normal perfusion myocardial scan    . CARDIOVERSION  10/18/2007   successful  . CARDIOVERSION N/A 02/08/2013   Procedure: CARDIOVERSION;  Surgeon: Troy Sine, MD;  Location: [place];  Service: Cardiovascular;  Laterality: N/A;  . CARDIOVERSION N/A 08/17/2013   Procedure: CARDIOVERSION;  Surgeon: Sanda Klein, MD;  Location: [place] ENDOSCOPY;  Service: Cardiovascular;  Laterality: N/A;  . CARDIOVERSION N/A 12/28/2013   Procedure: CARDIOVERSION;  Surgeon: Fay Records, MD;  Location: [facility] ENDOSCOPY;  Service: Cardiovascular;  Laterality: N/A;  . CARDIOVERSION N/A 01/19/2014   Procedure: CARDIOVERSION;  Surgeon: Sanda Klein, MD;  Location: [facility] ENDOSCOPY;  Service: Cardiovascular;  Laterality: N/A;  . CARDIOVERSION N/A 03/11/2014   Procedure: CARDIOVERSION;  Surgeon: Evans Lance, MD;  Location: [place];  Service: Cardiovascular;  Laterality: N/A;  . CARDIOVERSION  N/A 08/23/2014   Procedure: CARDIOVERSION;  Surgeon: Lelon Perla, MD;  Location: [facility] ENDOSCOPY;  Service: Cardiovascular;  Laterality: N/A;  . CARDIOVERSION N/A 10/04/2014   Procedure: CARDIOVERSION;  Surgeon: Sueanne Margarita, MD;  Location: [facility] ENDOSCOPY;  Service: Cardiovascular;  Laterality: N/A;  . CARDIOVERSION N/A 05/22/2015   Procedure: CARDIOVERSION;  Surgeon: Thayer Headings, MD;  Location: [place];  Service: Cardiovascular;  Laterality: N/A;  . CARDIOVERSION  N/A 10/08/2017   Procedure: CARDIOVERSION;  Surgeon: Pixie Casino, MD;  Location: [facility] ENDOSCOPY;  Service: Cardiovascular;  Laterality: N/A;  . CARPAL TUNNEL RELEASE Left April 2015  . ELBOW / UPPER ARM FOREIGN BODY REMOVAL    . ELECTROPHYSIOLOGIC STUDY N/A 09/25/2014   Atrial fibrillation ablation performed by Dr Rayann Heman  . FLEXIBLE SIGMOIDOSCOPY N/A 08/28/2015   Procedure: FLEXIBLE SIGMOIDOSCOPY;  Surgeon: [name], MD;  Location: [facility] ENDOSCOPY;  Service: Endoscopy;  Laterality: N/A;  . FRACTURE SURGERY Left    Left wrist  . HOT HEMOSTASIS N/A 08/28/2015   Procedure: HOT HEMOSTASIS (ARGON PLASMA COAGULATION/BICAP);  Surgeon: [name], MD;  Location[facility];  Service: Endoscopy;  Laterality: N/A;  . LEFT AND RIGHT HEART CATHETERIZATION WITH CORONARY ANGIOGRAM N/A 03/17/2013   Procedure: LEFT AND RIGHT HEART CATHETERIZATION WITH CORONARY ANGIOGRAM;  Surgeon: Troy Sine, MD;  Location: [facility] CATH LAB;  Service: Cardiovascular;  Laterality: N/A;  . osteosynthesis Left 09/30/2012   with small plate in the ulna  . SUBXYPHOID PERICARDIAL WINDOW N/A 04/25/2013   Procedure: SUBXYPHOID PERICARDIAL WINDOW;  Surgeon: Gaye Pollack, MD;  Location: [facility] OR;  Service: Thoracic;  Laterality: N/A;  . TEE WITHOUT CARDIOVERSION N/A 09/24/2014   Procedure: TRANSESOPHAGEAL ECHOCARDIOGRAM (TEE);  Surgeon: Dorothy Spark, MD;  Location: [facility] ENDOSCOPY;  Service: Cardiovascular;  Laterality: N/A;  . TRANSESOPHAGEAL ECHOCARDIOGRAM  08/26/2010   see Notes tab    Current Outpatient Medications  Medication Sig Dispense Refill  . atorvastatin (LIPITOR) 40 MG tablet TAKE 1 TABLET BY MOUTH EVERY DAY IN THE EVENING 90 tablet 3  . Cholecalciferol (VITAMIN D) 2000 UNITS CAPS Take 2,000 Units by mouth daily.    Marland Kitchen diltiazem (CARDIZEM CD) 180 MG 24 hr capsule TAKE 1 CAPSULE BY MOUTH EVERY DAY 90 capsule 2  . dofetilide (TIKOSYN) 500 MCG capsule TAKE ONE CAPSULE BY MOUTH TWICE A DAY 180 capsule 1  .  finasteride (PROPECIA) 1 MG tablet Take 1 mg by mouth every morning.     . furosemide (LASIX) 40 MG tablet Take 0.5 tablets (20 mg total) by mouth daily. 45 tablet 3  . metoprolol succinate (TOPROL-XL) 25 MG 24 hr tablet Take 12.5 mg by mouth daily as needed (rapid heartrate).    . potassium chloride SA (KLOR-CON M20) 20 MEQ tablet TAKE ONE (1) TABLET (20 MEQ) BY MOUTH DAILY. 90 tablet 1  . Saw Palmetto 450 MG CAPS Take 1 capsule by mouth in the morning and at bedtime.    . Saw Palmetto, Serenoa repens, (SAW PALMETTO PO) Take 900 mg by mouth 2 (two) times daily.     Alveda Reasons 20 MG TABS tablet TAKE 1 TABLET (20 MG TOTAL) BY MOUTH DAILY WITH SUPPER. 90 tablet 1   No current facility-administered medications for this visit.    Allergies:   Tetracyclines & related   Social History:  The patient  reports that he quit smoking about 30 years ago. He has a 10.00 pack-year smoking history. He has never used smokeless tobacco. He reports that he does  not drink alcohol or use drugs.   ROS:  Please see the history of present illness.   All other systems are personally reviewed and negative.    Exam:    Vital Signs:  BP 130/80   Pulse 72   Ht 6' 1.5" (1.867 m)   Wt 173 lb (78.5 kg)   BMI 22.52 kg/m   Recently 117/73  Well sounding and appearing, alert and conversant, regular work of breathing,  good skin color Eyes- anicteric, neuro- grossly intact, skin- no apparent rash or lesions or cyanosis, mouth- oral mucosa is pink  Labs/Other Tests and Data Reviewed:    Recent Labs: No results found for requested labs within last 8760 hours.   Wt Readings from Last 3 Encounters:  07/10/19 173 lb (78.5 kg)  01/27/19 170 lb 12.8 oz (77.5 kg)  01/20/19 170 lb (77.1 kg)       ASSESSMENT & PLAN:    1.  Persistent atrial fibrillation Remains in sinus with tikosyn The importance of close follow-up to avoid toxicity with this medicine was encouraged On xarelto, without bleeding Labs from 09/2018  reviewed.  He has additional labs from 12/20 which he will have forwarded from PCP to me. He will come to the AF clinic for an EKG in the next few weeks  2. Pericardia effusion Small by echo 01/2019 (reviewed with him today) Repeat echo in 6 months  3. HTN Stable No change required today  4. CAD No ischemic symptoms  5. OSA Follows with Dr Maxwell Caul Pleased with current state  6. Aortic root enlargement Repeat echo in 6 onths  Follow-up:  6 months in AF clinic with an echo I will see in a year   Patient Risk:  after full review of this patients clinical status, I feel that they are at moderate risk at this time.  Today, I have spent 15 minutes with the patient with telehealth technology discussing arrhythmia management .    Army Fossa, MD  07/10/2019 11:54 AM     [place] [address] [phone] (office) [phone] (fax)

## 2019-07-18 ENCOUNTER — Other Ambulatory Visit: Payer: Self-pay

## 2019-07-18 ENCOUNTER — Ambulatory Visit (HOSPITAL_COMMUNITY)
Admission: RE | Admit: 2019-07-18 | Discharge: 2019-07-18 | Disposition: A | Discharge status: Home / Self Care | Payer: Medicare PPO | Source: Ambulatory Visit | Attending: Nurse Practitioner | Admitting: Nurse Practitioner

## 2019-07-18 DIAGNOSIS — R9431 Abnormal electrocardiogram [ECG] [EKG]: Secondary | ICD-10-CM | POA: Insufficient documentation

## 2019-07-18 DIAGNOSIS — Z79899 Other long term (current) drug therapy: Secondary | ICD-10-CM | POA: Diagnosis not present

## 2019-07-18 DIAGNOSIS — I4891 Unspecified atrial fibrillation: Secondary | ICD-10-CM | POA: Diagnosis not present

## 2019-07-18 DIAGNOSIS — I4819 Other persistent atrial fibrillation: Secondary | ICD-10-CM

## 2019-07-18 LAB — BASIC METABOLIC PANEL
Anion gap: 10 (ref 5–15)
BUN: 18 mg/dL (ref 8–23)
CO2: 28 mmol/L (ref 22–32)
Calcium: 9.5 mg/dL (ref 8.9–10.3)
Chloride: 101 mmol/L (ref 98–111)
Creatinine, Ser: 0.92 mg/dL (ref 0.61–1.24)
GFR calc Af Amer: >60 mL/min (ref >60)
GFR calc non Af Amer: >60 mL/min (ref >60)
Glucose, Bld: 98 mg/dL (ref 70–99)
Potassium: 4.4 mmol/L (ref 3.5–5.1)
Sodium: 139 mmol/L (ref 135–145)

## 2019-07-18 LAB — MAGNESIUM: Magnesium: 2.1 mg/dL (ref 1.7–2.4)

## 2019-07-18 NOTE — Progress Notes (Signed)
Mr Squicciarini is in for EKG and labs.  He is on tikosyn and had f/u with Dr. Rayann Heman recently with a tele visit. Ekg today shows SR at 85 bpm, pr int 204 ms, qrs int 92 ms, qtc 466 ms (stable) bmet/mag drawn and I will see back in October per recall. Pending an echo per Dr. Rayann Heman.

## 2019-09-12 ENCOUNTER — Other Ambulatory Visit: Payer: Self-pay | Admitting: Internal Medicine

## 2019-10-07 ENCOUNTER — Other Ambulatory Visit: Payer: Self-pay | Admitting: Internal Medicine

## 2019-11-18 ENCOUNTER — Other Ambulatory Visit: Payer: Self-pay | Admitting: Internal Medicine

## 2019-11-20 ENCOUNTER — Ambulatory Visit (HOSPITAL_COMMUNITY)
Admission: RE | Admit: 2019-11-20 | Discharge: 2019-11-20 | Disposition: A | Discharge status: Home / Self Care | Payer: Medicare PPO | Source: Ambulatory Visit | Attending: Physician Assistant | Admitting: Physician Assistant

## 2019-11-20 ENCOUNTER — Telehealth (HOSPITAL_COMMUNITY): Payer: Self-pay

## 2019-11-20 ENCOUNTER — Other Ambulatory Visit: Payer: Self-pay

## 2019-11-20 VITALS — BP 116/60 | HR 135 | Ht 73.5 in | Wt 173.6 lb

## 2019-11-20 DIAGNOSIS — F419 Anxiety disorder, unspecified: Secondary | ICD-10-CM | POA: Insufficient documentation

## 2019-11-20 DIAGNOSIS — Z79899 Other long term (current) drug therapy: Secondary | ICD-10-CM | POA: Diagnosis not present

## 2019-11-20 DIAGNOSIS — I2510 Atherosclerotic heart disease of native coronary artery without angina pectoris: Secondary | ICD-10-CM | POA: Insufficient documentation

## 2019-11-20 DIAGNOSIS — I10 Essential (primary) hypertension: Secondary | ICD-10-CM | POA: Insufficient documentation

## 2019-11-20 DIAGNOSIS — D6869 Other thrombophilia: Secondary | ICD-10-CM | POA: Diagnosis not present

## 2019-11-20 DIAGNOSIS — E785 Hyperlipidemia, unspecified: Secondary | ICD-10-CM | POA: Insufficient documentation

## 2019-11-20 DIAGNOSIS — I4819 Other persistent atrial fibrillation: Secondary | ICD-10-CM | POA: Diagnosis not present

## 2019-11-20 DIAGNOSIS — Z7901 Long term (current) use of anticoagulants: Secondary | ICD-10-CM | POA: Insufficient documentation

## 2019-11-20 DIAGNOSIS — Z87891 Personal history of nicotine dependence: Secondary | ICD-10-CM | POA: Diagnosis not present

## 2019-11-20 DIAGNOSIS — I313 Pericardial effusion (noninflammatory): Secondary | ICD-10-CM | POA: Insufficient documentation

## 2019-11-20 DIAGNOSIS — G4733 Obstructive sleep apnea (adult) (pediatric): Secondary | ICD-10-CM | POA: Diagnosis not present

## 2019-11-20 DIAGNOSIS — F329 Major depressive disorder, single episode, unspecified: Secondary | ICD-10-CM | POA: Insufficient documentation

## 2019-11-20 LAB — BASIC METABOLIC PANEL
Anion gap: 11 (ref 5–15)
BUN: 21 mg/dL (ref 8–23)
CO2: 26 mmol/L (ref 22–32)
Calcium: 9.8 mg/dL (ref 8.9–10.3)
Chloride: 102 mmol/L (ref 98–111)
Creatinine, Ser: 0.96 mg/dL (ref 0.61–1.24)
GFR calc Af Amer: >60 mL/min (ref >60)
GFR calc non Af Amer: >60 mL/min (ref >60)
Glucose, Bld: 104 mg/dL — ABNORMAL HIGH (ref 70–99)
Potassium: 4.4 mmol/L (ref 3.5–5.1)
Sodium: 139 mmol/L (ref 135–145)

## 2019-11-20 LAB — CBC
HCT: 40.3 % (ref 39.0–52.0)
Hemoglobin: 12.9 g/dL — ABNORMAL LOW (ref 13.0–17.0)
MCH: 28.9 pg (ref 26.0–34.0)
MCHC: 32 g/dL (ref 30.0–36.0)
MCV: 90.2 fL (ref 80.0–100.0)
Platelets: 240 10*3/uL (ref 150–400)
RBC: 4.47 MIL/uL (ref 4.22–5.81)
RDW: 14.3 % (ref 11.5–15.5)
WBC: 5.9 10*3/uL (ref 4.0–10.5)
nRBC: 0 % (ref 0.0–0.2)

## 2019-11-20 LAB — MAGNESIUM: Magnesium: 2.1 mg/dL (ref 1.7–2.4)

## 2019-11-20 NOTE — Telephone Encounter (Signed)
Pt's age 74, wt 78.5 kg, SCr 0.92, CrCl 79.4, last ov w/ JA 07/10/19.

## 2019-11-20 NOTE — Telephone Encounter (Signed)
Patient called in and states is has been in Atrial Flutter since over the weekend. He is feeling dizzy and he is having raid heart beats. His Blood pressure 122/75 and heart rate was 107. He is scheduled to see Tilda Franco today at 2pm. Consulted with patient and he verbalized understanding.

## 2019-11-20 NOTE — Progress Notes (Addendum)
Primary Care Physician: [name], MD Referring Physician: [name] is a 74 y.o. male with a h/o very complex afib history requiring 80 + cardioversions, ablations and tikosyn, over many years of having afib. Patient has done well from an afib standpoint until this past weekend 11/17/19 when he noticed much more fatigue during exercise. The next morning he checked his HR which was elevated. He is in rapid afib today. There were no specific triggers that he could identify.   Today, he denies symptoms of chest pain, shortness of breath, orthopnea, PND, lower extremity edema, dizziness, presyncope, syncope, or neurologic sequela.  The patient is tolerating medications without difficulties and is otherwise without complaint today.   Past Medical History:  Diagnosis Date  . Cardiomyopathy ([place])    a. 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 b. also hx of tachycardia mediated CM, now resolved  . Complication of anesthesia    Pt has anxiety issues and thinks he is going to die.  . Depression   . Echocardiogram abnormal    EF60%, mild LVH, PA pk pressure 35, pericardial effusion mild increase from 2012   . H/O cardiac catheterization    no significant CAD by cath 5/12  . Hyperlipidemia   . Hypertension   . Limb pain 06/20/2007   LE venous doppler - no evidence of thrombus or thrombophlebitis  . Liver mass    a. CT 03/2013: Small probable cysts in the liver and small hyperenhancing liver mass.  . OSA (obstructive sleep apnea)    AHI avg 5/hr  . Paroxysmal atrial fibrillation (H[facility])    a. s/p afib ablations 2002 and 2009 by Dr Rolland Porter. b. s/p DCCV 02/2013. c. s/p DCCV 01/19/2014  . Pericardial effusion    a. Since 2007. b. s/p pericardial window 04/2013.  Marland Kitchen PVC (premature ventricular contraction) 12/18/2009   cardionet monitor 21 days - some PVCs, ventricular bigeminy  . Thoracic aortic aneurysm ([place])    a. Borderline enlarged by CT 12/14 at 4.0cm.     Past Surgical History:  Procedure Laterality Date  . CARDIAC CATHETERIZATION  08/25/10   NO SIGNIFICANT CAD  . CARDIAC ELECTROPHYSIOLOGY MAPPING AND ABLATION  2001,2009   Dr. Tally Due  . CARDIOVASCULAR STRESS TEST  09/08/2005   R/S MV - EF 61%; Exercises capacity 12 Mets; essentially normal perfusion myocardial scan    . CARDIOVERSION  10/18/2007   successful  . CARDIOVERSION N/A 02/08/2013   Procedure: CARDIOVERSION;  Surgeon: Troy Sine, MD;  Location: [facility];  Service: Cardiovascular;  Laterality: N/A;  . CARDIOVERSION N/A 08/17/2013   Procedure: CARDIOVERSION;  Surgeon: Sanda Klein, MD;  Location: [facility] ENDOSCOPY;  Service: Cardiovascular;  Laterality: N/A;  . CARDIOVERSION N/A 12/28/2013   Procedure: CARDIOVERSION;  Surgeon: Fay Records, MD;  Location: [facility];  Service: Cardiovascular;  Laterality: N/A;  . CARDIOVERSION N/A 01/19/2014   Procedure: CARDIOVERSION;  Surgeon: Sanda Klein, MD;  Location: [facility] ENDOSCOPY;  Service: Cardiovascular;  Laterality: N/A;  . CARDIOVERSION N/A 03/11/2014   Procedure: CARDIOVERSION;  Surgeon: Evans Lance, MD;  Location: [place];  Service: Cardiovascular;  Laterality: N/A;  . CARDIOVERSION N/A 08/23/2014   Procedure: CARDIOVERSION;  Surgeon: Lelon Perla, MD;  Location: [facility] ENDOSCOPY;  Service: Cardiovascular;  Laterality: N/A;  . CARDIOVERSION N/A 10/04/2014   Procedure: CARDIOVERSION;  Surgeon: Sueanne Margarita, MD;  Location: [facility] ENDOSCOPY;  Service: Cardiovascular;  Laterality: N/A;  . CARDIOVERSION N/A 05/22/2015  Procedure: CARDIOVERSION;  Surgeon: Thayer Headings, MD;  Location: [facility] ENDOSCOPY;  Service: Cardiovascular;  Laterality: N/A;  . CARDIOVERSION N/A 10/08/2017   Procedure: CARDIOVERSION;  Surgeon: Pixie Casino, MD;  Location: [place];  Service: Cardiovascular;  Laterality: N/A;  . CARPAL TUNNEL RELEASE Left April 2015  . ELBOW / UPPER ARM FOREIGN BODY REMOVAL    . ELECTROPHYSIOLOGIC STUDY N/A 09/25/2014    Atrial fibrillation ablation performed by Dr Rayann Heman  . FLEXIBLE SIGMOIDOSCOPY N/A 08/28/2015   Procedure: FLEXIBLE SIGMOIDOSCOPY;  Surgeon: [name], MD;  Location: [facility] ENDOSCOPY;  Service: Endoscopy;  Laterality: N/A;  . FRACTURE SURGERY Left    Left wrist  . HOT HEMOSTASIS N/A 08/28/2015   Procedure: HOT HEMOSTASIS (ARGON PLASMA COAGULATION/BICAP);  Surgeon: [name], MD;  Location: [facility] ENDOS[facility];  Service: Endoscopy;  Laterality: N/A;  . LEFT AND RIGHT HEART CATHETERIZATION WITH CORONARY ANGIOGRAM N/A 03/17/2013   Procedure: LEFT AND RIGHT HEART CATHETERIZATION WITH CORONARY ANGIOGRAM;  Surgeon: Troy Sine, MD;  Location: [facility] CATH LAB;  Service: Cardiovascular;  Laterality: N/A;  . osteosynthesis Left 09/30/2012   with small plate in the ulna  . SUBXYPHOID PERICARDIAL WINDOW N/A 04/25/2013   Procedure: SUBXYPHOID PERICARDIAL WINDOW;  Surgeon: Gaye Pollack, MD;  Location: [facility] OR;  Service: Thoracic;  Laterality: N/A;  . TEE WITHOUT CARDIOVERSION N/A 09/24/2014   Procedure: TRANSESOPHAGEAL ECHOCARDIOGRAM (TEE);  Surgeon: Dorothy Spark, MD;  Location: [facility] ENDOSCOPY;  Service: Cardiovascular;  Laterality: N/A;  . TRANSESOPHAGEAL ECHOCARDIOGRAM  08/26/2010   see Notes tab    Current Outpatient Medications  Medication Sig Dispense Refill  . atorvastatin (LIPITOR) 40 MG tablet TAKE 1 TABLET BY MOUTH EVERY DAY IN THE EVENING 90 tablet 3  . Cholecalciferol (VITAMIN D) 2000 UNITS CAPS Take 2,000 Units by mouth daily.    Marland Kitchen diltiazem (CARDIZEM CD) 180 MG 24 hr capsule TAKE 1 CAPSULE BY MOUTH EVERY DAY 90 capsule 0  . dofetilide (TIKOSYN) 500 MCG capsule TAKE ONE CAPSULE BY MOUTH TWICE A DAY 180 capsule 1  . finasteride (PROPECIA) 1 MG tablet Take 1 mg by mouth every morning.     . furosemide (LASIX) 40 MG tablet Take 0.5 tablets (20 mg total) by mouth daily. 45 tablet 3  . metoprolol succinate (TOPROL-XL) 25 MG 24 hr tablet Take 12.5 mg by mouth daily as needed (rapid heartrate).      . pantoprazole (PROTONIX) 40 MG tablet Take 40 mg by mouth as needed.    . potassium chloride SA (KLOR-CON M20) 20 MEQ tablet TAKE ONE (1) TABLET (20 MEQ) BY MOUTH DAILY. 90 tablet 1  . Saw Palmetto 450 MG CAPS Take 1 capsule by mouth in the morning and at bedtime.    Alveda Reasons 20 MG TABS tablet TAKE 1 TABLET (20 MG TOTAL) BY MOUTH DAILY WITH SUPPER. 90 tablet 1   No current facility-administered medications for this encounter.    Allergies  Allergen Reactions  . Tetracyclines & Related Hives    rash    Social History   Socioeconomic History  . Marital status: Single    Spouse name: Not on file  . Number of children: 0  . Years of education: Not on file  . Highest education level: Not on file  Occupational History    Employer: [employer]  Tobacco Use  . Smoking status: Former Smoker    Packs/day: 1.00    Years: 10.00    Pack years: 10.00    Quit date: 02/06/1989  Years since quitting: 30.8  . Smokeless tobacco: Never Used  Vaping Use  . Vaping Use: Never used  Substance and Sexual Activity  . Alcohol use: No    Comment: 2 bottles of wine per week, pt states has not drank alcohol since christmas  . Drug use: No  . Sexual activity: Not on file  Other Topics Concern  . 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   Social Determinants of Health   Financial Resource Strain:   . Difficulty of Paying Living Expenses:   Food Insecurity:   . Worried About Charity fundraiser in the Last Year:   . Arboriculturist in the Last Year:   Transportation Needs:   . Film/video editor (Medical):   Marland Kitchen Lack of Transportation (Non-Medical):   Physical Activity:   . Days of Exercise per Week:   . Minutes of Exercise per Session:   Stress:   . Feeling of Stress :   Social  Connections:   . Frequency of Communication with Friends and Family:   . Frequency of Social Gatherings with Friends and Family:   . Attends Religious Services:   . Active Member of Clubs or Organizations:   . Attends Archivist Meetings:   Marland Kitchen Marital Status:   Intimate Partner Violence:   . Fear of Current or Ex-Partner:   . Emotionally Abused:   Marland Kitchen Physically Abused:   . Sexually Abused:     Family History  Problem Relation Age of Onset  . Coronary artery disease Father   . Heart attack Father   . Stroke Mother        died  . Atrial fibrillation Brother   . Mitral valve prolapse Sister     ROS- All systems are reviewed and negative except as per the HPI above  Physical Exam: Vitals:   11/20/19 1416  BP: 116/60  Pulse: (!) 135  Weight: 78.7 kg  Height: 6' 1.5" (1.867 m)   Wt Readings from Last 3 Encounters:  11/20/19 78.7 kg  07/10/19 78.5 kg  01/27/19 77.5 kg    Labs: Lab Results  Component Value Date   NA 139 07/18/2019   K 4.4 07/18/2019   CL 101 07/18/2019   CO2 28 07/18/2019   GLUCOSE 98 07/18/2019   BUN 18 07/18/2019   CREATININE 0.92 07/18/2019   CALCIUM 9.5 07/18/2019   MG 2.1 07/18/2019   Lab Results  Component Value Date   INR 0.90 04/24/2013   Lab Results  Component Value Date   CHOL  08/23/2007    188        ATP III CLASSIFICATION:  <200     mg/dL   Desirable  200-239  mg/dL   Borderline High  >=240    mg/dL   High   HDL 47 08/23/2007   LDLCALC (H) 08/23/2007    128        Total Cholesterol/HDL:CHD Risk Coronary Heart Disease Risk Table                     Men   Women  1/2 Average Risk   3.4   3.3   TRIG 63 08/23/2007    GEN- The patient is well appearing, alert and  oriented x 3 today.   HEENT-head normocephalic, atraumatic, sclera clear, conjunctiva pink, hearing intact, trachea midline. Lungs- Clear to ausculation bilaterally, normal work of breathing Heart- irregular rate and rhythm, tachycardia, no murmurs, rubs or  gallops  GI- soft, NT, ND, + BS Extremities- no clubbing, cyanosis, or edema MS- no significant deformity or atrophy Skin- no rash or lesion Psych- euthymic mood, full affect Neuro- strength and sensation are intact   EKG- atrial fibrillation HR 135, QRS 88, QTc 417  Echo 09/19/18 There is a small to moderate pericardial effusion moslty lateral to the RV with no tamponade suggest f/u echo in 6-8 weeks 1. The left ventricle has normal systolic function with an ejection fraction of 60-65%. The cavity size was normal. There is mildly increased left ventricular wall thickness. Left ventricular diastolic parameters were normal.  2. The right ventricle has normal systolic function. The cavity was normal. There is no increase in right ventricular wall thickness.  3. Mild thickening of the mitral valve leaflet.  4. Mild thickening of the aortic valve. Aortic valve regurgitation is trivial by color flow Doppler.  5. There is mild dilatation of the aortic root measuring 39 mm.  Assessment and Plan: 1. Persistent afib  Patient back in rapid afib. Last DCCV was 10/2017. Continue Tikosyn 500 mcg BID, QT stable. Continue diltiazem 180 mg daily Check bmet/mag/CBC today. Continue Xarelto 20 mg daily for a CHADS2VASC score of at least 4.  2. Pericardial effusion S/p pericardial window 2015. Repeat echo scheduled.   3. OSA Followed by Dr Maxwell Caul  4. HTN Stable, no changes today.  5. CAD No anginal symptoms.  6. Aortic root enlargement Repeat echo scheduled.    Follow up in the AF clinic post DCCV.    [facility] [address] [place], [place] [address] [phone]

## 2019-11-20 NOTE — Patient Instructions (Signed)
Cardioversion scheduled for Wednesday, August 18th  - Arrive at the [facility] and go to admitting at 12PM  - Do not eat or drink anything after midnight the night prior to your procedure.  - Take all your morning medication (except diabetic medications) with a sip of water prior to arrival.  - You will not be able to drive home after your procedure.  - Do NOT miss any doses of your blood thinner - if you should miss a dose please notify our office immediately.  Take metoprolol 1/2 tablet twice a day until day of cardioversion then go back to as needed only

## 2019-11-21 ENCOUNTER — Other Ambulatory Visit (HOSPITAL_COMMUNITY)
Admission: RE | Admit: 2019-11-21 | Discharge: 2019-11-21 | Disposition: A | Discharge status: Home / Self Care | Payer: Medicare PPO | Source: Ambulatory Visit | Attending: Internal Medicine | Admitting: Internal Medicine

## 2019-11-21 DIAGNOSIS — Z20822 Contact with and (suspected) exposure to covid-19: Secondary | ICD-10-CM | POA: Diagnosis not present

## 2019-11-21 DIAGNOSIS — Z01812 Encounter for preprocedural laboratory examination: Secondary | ICD-10-CM | POA: Insufficient documentation

## 2019-11-21 LAB — SARS CORONAVIRUS 2 (TAT 6-24 HRS): SARS Coronavirus 2: NEGATIVE

## 2019-11-22 ENCOUNTER — Ambulatory Visit (HOSPITAL_COMMUNITY): Payer: Medicare PPO | Admitting: Anesthesiology

## 2019-11-22 ENCOUNTER — Telehealth (HOSPITAL_COMMUNITY): Payer: Self-pay

## 2019-11-22 ENCOUNTER — Ambulatory Visit (HOSPITAL_COMMUNITY)
Admission: RE | Admit: 2019-11-22 | Discharge: 2019-11-22 | Disposition: A | Discharge status: Home / Self Care | Payer: Medicare PPO | Attending: Internal Medicine | Admitting: Internal Medicine

## 2019-11-22 ENCOUNTER — Encounter (HOSPITAL_COMMUNITY)
Admission: RE | Disposition: A | Discharge status: Home / Self Care | Payer: Self-pay | Source: Home / Self Care | Attending: Internal Medicine

## 2019-11-22 DIAGNOSIS — I4891 Unspecified atrial fibrillation: Secondary | ICD-10-CM | POA: Insufficient documentation

## 2019-11-22 DIAGNOSIS — Z539 Procedure and treatment not carried out, unspecified reason: Secondary | ICD-10-CM | POA: Insufficient documentation

## 2019-11-22 DIAGNOSIS — I480 Paroxysmal atrial fibrillation: Secondary | ICD-10-CM

## 2019-11-22 SURGERY — CANCELLED PROCEDURE
Anesthesia: Monitor Anesthesia Care

## 2019-11-22 MED ORDER — METOPROLOL SUCCINATE ER 25 MG PO TB24: 12.5000 mg | ORAL_TABLET | Freq: Two times a day (BID) | ORAL | 3 refills | Status: DC

## 2019-11-22 NOTE — Telephone Encounter (Signed)
Patient called in and states he converted back to NSR this morning prior to his Cardioversion. He states he did not take his Metoprolol medication this morning. He is feeling much better. He was wondering if he should stay on the Metoprolol to help keep him in NSR. Once he was home around 3:45pm he did take the Metoprolol 12.5mg  tablet. Advised patient to check his blood pressure while  I was on the phone with him. B/p was 119/73, HR 82. Per Tilda Franco he should continue taking the Metoprolol Succinate 12.5mg  tablet twice daily. I sent in a 90 day supply with 3 additional refills to his pharmacy. Consulted with patient and he verbalized understanding.

## 2019-11-22 NOTE — Progress Notes (Signed)
Pt presented today for cardioversion.  Self converted to NSR - EKG obtained.  Dr. Margaretann Loveless reviewed EKG and spoke with patient.  Pt to d/c home.

## 2019-11-22 NOTE — Anesthesia Preprocedure Evaluation (Deleted)
Anesthesia Evaluation  Patient identified by MRN, date of birth, ID band Patient awake    Reviewed: Allergy & Precautions, NPO status , Patient's Chart, lab work & pertinent test results, reviewed documented beta blocker date and time   Airway Mallampati: II  TM Distance: >3 FB Neck ROM: Full    Dental  (+) Teeth Intact, Dental Advisory Given   Pulmonary sleep apnea , former smoker,    Pulmonary exam normal breath sounds clear to auscultation       Cardiovascular hypertension, Pt. on medications and Pt. on home beta blockers + Peripheral Vascular Disease (Thoracic aortic aneurysm)  + dysrhythmias Atrial Fibrillation  Rhythm:Irregular Rate:Abnormal  Echo 01/2019: 1. There is a small to moderate pericardial effusion primarily adjacent  to the RV, similar to prior. The IVC is dilated, but there is no RV  diastolic collapse. I do not think that tamponade is present.  2. Left ventricular ejection fraction, by visual estimation, is 60 to  65%. The left ventricle has normal function. Normal left ventricular size.  There is no left ventricular hypertrophy.  3. Left ventricular diastolic Doppler parameters are consistent with  pseudonormalization pattern of LV diastolic filling.  4. Global right ventricle has normal systolic function.The right  ventricular size is normal. No increase in right ventricular wall  thickness.  5. Left atrial size was normal.  6. Right atrial size was normal.  7. The mitral valve is normal in structure. Trace mitral valve  regurgitation. No evidence of mitral stenosis.  8. The tricuspid valve is normal in structure. Tricuspid valve  regurgitation is mild.  9. The aortic valve is tricuspid Aortic valve regurgitation was not  visualized by color flow Doppler. Structurally normal aortic valve, with  no evidence of sclerosis or stenosis.  10. There is mild dilatation of the aortic root measuring 44 mm.   11. The inferior vena cava is dilated in size with <50% respiratory  variability, suggesting right atrial pressure of 15 mmHg.  12. The tricuspid regurgitant velocity is 3.00 m/s, and with an assumed  right atrial pressure of 15 mmHg, the estimated right ventricular systolic  pressure is moderately elevated at 51.0 mmHg.    Neuro/Psych PSYCHIATRIC DISORDERS Depression negative neurological ROS     GI/Hepatic negative GI ROS, Neg liver ROS,   Endo/Other  negative endocrine ROS  Renal/GU negative Renal ROS     Musculoskeletal negative musculoskeletal ROS (+)   Abdominal   Peds  Hematology  (+) Blood dyscrasia (Xarelto), anemia ,   Anesthesia Other Findings Day of surgery medications reviewed with the patient.  Reproductive/Obstetrics                             Anesthesia Physical Anesthesia Plan  ASA: III  Anesthesia Plan: MAC   Post-op Pain Management:    Induction: Intravenous  PONV Risk Score and Plan: 1 and Propofol infusion and Treatment may vary due to age or medical condition  Airway Management Planned: Natural Airway and Nasal Cannula  Additional Equipment:   Intra-op Plan:   Post-operative Plan:   Informed Consent: I have reviewed the patients History and Physical, chart, labs and discussed the procedure including the risks, benefits and alternatives for the proposed anesthesia with the patient or authorized representative who has indicated his/her understanding and acceptance.     Dental advisory given  Plan Discussed with: CRNA  Anesthesia Plan Comments:         Anesthesia  Quick Evaluation

## 2019-11-29 ENCOUNTER — Ambulatory Visit (HOSPITAL_COMMUNITY)
Admission: RE | Admit: 2019-11-29 | Discharge: 2019-11-29 | Disposition: A | Discharge status: Home / Self Care | Payer: Medicare PPO | Source: Ambulatory Visit | Attending: Nurse Practitioner | Admitting: Nurse Practitioner

## 2019-11-29 ENCOUNTER — Other Ambulatory Visit: Payer: Self-pay

## 2019-11-29 ENCOUNTER — Encounter (HOSPITAL_COMMUNITY): Payer: Self-pay | Admitting: Nurse Practitioner

## 2019-11-29 VITALS — BP 134/78 | HR 61 | Ht 73.5 in | Wt 174.2 lb

## 2019-11-29 DIAGNOSIS — E785 Hyperlipidemia, unspecified: Secondary | ICD-10-CM | POA: Insufficient documentation

## 2019-11-29 DIAGNOSIS — Z79899 Other long term (current) drug therapy: Secondary | ICD-10-CM | POA: Insufficient documentation

## 2019-11-29 DIAGNOSIS — I2510 Atherosclerotic heart disease of native coronary artery without angina pectoris: Secondary | ICD-10-CM | POA: Diagnosis not present

## 2019-11-29 DIAGNOSIS — I4819 Other persistent atrial fibrillation: Secondary | ICD-10-CM | POA: Insufficient documentation

## 2019-11-29 DIAGNOSIS — I429 Cardiomyopathy, unspecified: Secondary | ICD-10-CM | POA: Diagnosis not present

## 2019-11-29 DIAGNOSIS — Z87891 Personal history of nicotine dependence: Secondary | ICD-10-CM | POA: Diagnosis not present

## 2019-11-29 DIAGNOSIS — Z7901 Long term (current) use of anticoagulants: Secondary | ICD-10-CM | POA: Diagnosis not present

## 2019-11-29 DIAGNOSIS — G4733 Obstructive sleep apnea (adult) (pediatric): Secondary | ICD-10-CM | POA: Diagnosis not present

## 2019-11-29 DIAGNOSIS — I313 Pericardial effusion (noninflammatory): Secondary | ICD-10-CM | POA: Diagnosis not present

## 2019-11-29 DIAGNOSIS — I10 Essential (primary) hypertension: Secondary | ICD-10-CM | POA: Insufficient documentation

## 2019-11-29 DIAGNOSIS — Z8249 Family history of ischemic heart disease and other diseases of the circulatory system: Secondary | ICD-10-CM | POA: Insufficient documentation

## 2019-11-29 DIAGNOSIS — D6869 Other thrombophilia: Secondary | ICD-10-CM

## 2019-11-29 NOTE — Progress Notes (Signed)
Primary Care Physician: [name], MD Referring Physician: Dr. Macon Large is a 74 y.o. male with a h/o very complex afib history requiring 80 + cardioversions, ablations and tikosyn, over many years of having afib. Patient has done well from an afib standpoint until this past weekend 11/17/19 when he noticed much more fatigue during exercise. The next morning he checked his HR which was elevated. He is in rapid afib today. There were no specific triggers that he could identify.   F/u in afib clinic, 8/25. He is here to f/u cardioversion, but converted himself the am of cardioversion. 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 EKG shows SR with first degree AV block at 61 bpm.   Today, he denies symptoms of chest pain, shortness of breath, orthopnea, PND, lower extremity edema, dizziness, presyncope, syncope, or neurologic sequela.  The patient is tolerating medications without difficulties and is otherwise without complaint today.   Past Medical History:  Diagnosis Date  . Cardiomyopathy ([place])    a. 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 b. also hx of tachycardia mediated CM, now resolved  . Complication of anesthesia    Pt has anxiety issues and thinks he is going to die.  . Depression   . Echocardiogram abnormal    EF60%, mild LVH, PA pk pressure 35, pericardial effusion mild increase from 2012   . H/O cardiac catheterization    no significant CAD by cath 5/12  . Hyperlipidemia   . Hypertension   . Limb pain 06/20/2007   LE venous doppler - no evidence of thrombus or thrombophlebitis  . Liver mass    a. CT 03/2013: Small probable cysts in the liver and small hyperenhancing liver mass.  . OSA (obstructive sleep apnea)    AHI avg 5/hr  . Paroxysmal atrial fibrillation (H[facility])    a. s/p afib ablations 2002 and 2009 by Dr Rolland Porter. b. s/p DCCV 02/2013. c. s/p DCCV 01/19/2014  . Pericardial effusion    a.  Since 2007. b. s/p pericardial window 04/2013.  Marland Kitchen PVC (premature ventricular contraction) 12/18/2009   cardionet monitor 21 days - some PVCs, ventricular bigeminy  . Thoracic aortic aneurysm ([place])    a. Borderline enlarged by CT 12/14 at 4.0cm.   Past Surgical History:  Procedure Laterality Date  . CARDIAC CATHETERIZATION  08/25/10   NO SIGNIFICANT CAD  . CARDIAC ELECTROPHYSIOLOGY MAPPING AND ABLATION  2001,2009   Dr. Tally Due  . CARDIOVASCULAR STRESS TEST  09/08/2005   R/S MV - EF 61%; Exercises capacity 12 Mets; essentially normal perfusion myocardial scan    . CARDIOVERSION  10/18/2007   successful  . CARDIOVERSION N/A 02/08/2013   Procedure: CARDIOVERSION;  Surgeon: Troy Sine, MD;  Location: [place];  Service: Cardiovascular;  Laterality: N/A;  . CARDIOVERSION N/A 08/17/2013   Procedure: CARDIOVERSION;  Surgeon: Sanda Klein, MD;  Location: [facility] ENDOSCOPY;  Service: Cardiovascular;  Laterality: N/A;  . CARDIOVERSION N/A 12/28/2013   Procedure: CARDIOVERSION;  Surgeon: Fay Records, MD;  Location: [facility] ENDOSCOPY;  Service: Cardiovascular;  Laterality: N/A;  . CARDIOVERSION N/A 01/19/2014   Procedure: CARDIOVERSION;  Surgeon: Sanda Klein, MD;  Location: [facility] ENDOSCOPY;  Service: Cardiovascular;  Laterality: N/A;  . CARDIOVERSION N/A 03/11/2014   Procedure: CARDIOVERSION;  Surgeon: Evans Lance, MD;  Location: [place];  Service: Cardiovascular;  Laterality: N/A;  . CARDIOVERSION N/A 08/23/2014  Procedure: CARDIOVERSION;  Surgeon: Lelon Perla, MD;  Location: [facility] ENDOSCOPY;  Service: Cardiovascular;  Laterality: N/A;  . CARDIOVERSION N/A 10/04/2014   Procedure: CARDIOVERSION;  Surgeon: Sueanne Margarita, MD;  Location: [facility] ENDOSCOPY;  Service: Cardiovascular;  Laterality: N/A;  . CARDIOVERSION N/A 05/22/2015   Procedure: CARDIOVERSION;  Surgeon: Thayer Headings, MD;  Location: [place];  Service: Cardiovascular;  Laterality: N/A;  . CARDIOVERSION N/A 10/08/2017   Procedure:  CARDIOVERSION;  Surgeon: Pixie Casino, MD;  Location: [facility] ENDOSCOPY;  Service: Cardiovascular;  Laterality: N/A;  . CARPAL TUNNEL RELEASE Left April 2015  . ELBOW / UPPER ARM FOREIGN BODY REMOVAL    . ELECTROPHYSIOLOGIC STUDY N/A 09/25/2014   Atrial fibrillation ablation performed by Dr Rayann Heman  . FLEXIBLE SIGMOIDOSCOPY N/A 08/28/2015   Procedure: FLEXIBLE SIGMOIDOSCOPY;  Surgeon: [name], MD;  Location: [facility] ENDOSCOPY;  Service: Endoscopy;  Laterality: N/A;  . FRACTURE SURGERY Left    Left wrist  . HOT HEMOSTASIS N/A 08/28/2015   Procedure: HOT HEMOSTASIS (ARGON PLASMA COAGULATION/BICAP);  Surgeon: [name], MD;  Location: [facility];  Service: Endoscopy;  Laterality: N/A;  . LEFT AND RIGHT HEART CATHETERIZATION WITH CORONARY ANGIOGRAM N/A 03/17/2013   Procedure: LEFT AND RIGHT HEART CATHETERIZATION WITH CORONARY ANGIOGRAM;  Surgeon: Troy Sine, MD;  Location: [facility] CATH LAB;  Service: Cardiovascular;  Laterality: N/A;  . osteosynthesis Left 09/30/2012   with small plate in the ulna  . SUBXYPHOID PERICARDIAL WINDOW N/A 04/25/2013   Procedure: SUBXYPHOID PERICARDIAL WINDOW;  Surgeon: Gaye Pollack, MD;  Location: [facility] OR;  Service: Thoracic;  Laterality: N/A;  . TEE WITHOUT CARDIOVERSION N/A 09/24/2014   Procedure: TRANSESOPHAGEAL ECHOCARDIOGRAM (TEE);  Surgeon: Dorothy Spark, MD;  Location: [facility] ENDOSCOPY;  Service: Cardiovascular;  Laterality: N/A;  . TRANSESOPHAGEAL ECHOCARDIOGRAM  08/26/2010   see Notes tab    Current Outpatient Medications  Medication Sig Dispense Refill  . atorvastatin (LIPITOR) 40 MG tablet TAKE 1 TABLET BY MOUTH EVERY DAY IN THE EVENING (Patient taking differently: Take 40 mg by mouth daily. ) 90 tablet 3  . b complex vitamins capsule Take 1 capsule by mouth daily.    Marland Kitchen CALCIUM-MAGNESIUM-ZINC PO Take 3 tablets by mouth daily.     . Cholecalciferol (VITAMIN D) 2000 UNITS CAPS Take 2,000 Units by mouth daily.    Marland Kitchen diltiazem (CARDIZEM CD) 180 MG 24 hr  capsule TAKE 1 CAPSULE BY MOUTH EVERY DAY (Patient taking differently: Take 180 mg by mouth daily. ) 90 capsule 0  . dofetilide (TIKOSYN) 500 MCG capsule TAKE ONE CAPSULE BY MOUTH TWICE A DAY (Patient taking differently: Take 500 mcg by mouth 2 (two) times daily. ) 180 capsule 1  . finasteride (PROPECIA) 1 MG tablet Take 1 mg by mouth daily.     . furosemide (LASIX) 40 MG tablet Take 0.5 tablets (20 mg total) by mouth daily. 45 tablet 3  . metoprolol succinate (TOPROL-XL) 25 MG 24 hr tablet Take 0.5 tablets (12.5 mg total) by mouth in the morning and at bedtime. Use for Afib 90 tablet 3  . potassium chloride SA (KLOR-CON M20) 20 MEQ tablet TAKE ONE (1) TABLET (20 MEQ) BY MOUTH DAILY. (Patient taking differently: Take 20 mEq by mouth daily. ) 90 tablet 1  . Saw Palmetto 450 MG CAPS Take 900 mg by mouth in the morning and at bedtime.     Alveda Reasons 20 MG TABS tablet TAKE 1 TABLET (20 MG TOTAL) BY MOUTH DAILY WITH SUPPER. (Patient taking differently:  Take 20 mg by mouth daily with supper. ) 90 tablet 1   No current facility-administered medications for this encounter.    Allergies  Allergen Reactions  . Tetracyclines & Related Hives    rash    Social History   Socioeconomic History  . Marital status: Single    Spouse name: Not on file  . Number of children: 0  . Years of education: Not on file  . Highest education level: Not on file  Occupational History    Employer: [employer]  Tobacco Use  . Smoking status: Former Smoker    Packs/day: 1.00    Years: 10.00    Pack years: 10.00    Quit date: 02/06/1989    Years since quitting: 30.8  . Smokeless tobacco: Never Used  Vaping Use  . Vaping Use: Never used  Substance and Sexual Activity  . Alcohol use: No    Comment: 2 bottles of wine per week, pt states has not drank alcohol since christmas  . Drug use: No  . Sexual activity: Not on file  Other Topics Concern  . 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   Social Determinants of Health   Financial Resource Strain:   . Difficulty of Paying Living Expenses: Not on file  Food Insecurity:   . Worried About Charity fundraiser in the Last Year: Not on file  . Ran Out of Food in the Last Year: Not on file  Transportation Needs:   . Lack of Transportation (Medical): Not on file  . Lack of Transportation (Non-Medical): Not on file  Physical Activity:   . Days of Exercise per Week: Not on file  . Minutes of Exercise per Session: Not on file  Stress:   . Feeling of Stress : Not on file  Social Connections:   . Frequency of Communication with Friends and Family: Not on file  . Frequency of Social Gatherings with Friends and Family: Not on file  . Attends Religious Services: Not on file  . Active Member of Clubs or Organizations: Not on file  . Attends Archivist Meetings: Not on file  . Marital Status: Not on file  Intimate Partner Violence:   . Fear of Current or Ex-Partner: Not on file  . Emotionally Abused: Not on file  . Physically Abused: Not on file  . Sexually Abused: Not on file    Family History  Problem Relation Age of Onset  . Coronary artery disease Father   . Heart attack Father   . Stroke Mother        died  . Atrial fibrillation Brother   . Mitral valve prolapse Sister     ROS- All systems are reviewed and negative except as per the HPI above  Physical Exam: Vitals:   11/29/19 1140  BP: 134/78  Pulse: 61  Weight: 79 kg  Height: 6' 1.5" (1.867 m)   Wt Readings from Last 3 Encounters:  11/29/19 79 kg  11/20/19 78.7 kg  07/10/19 78.5 kg    Labs: Lab Results  Component Value Date   NA 139 11/20/2019   K 4.4 11/20/2019   CL 102 11/20/2019   CO2 26 11/20/2019   GLUCOSE 104 (H) 11/20/2019   BUN 21 11/20/2019  CREATININE 0.96 11/20/2019   CALCIUM 9.8 11/20/2019   MG 2.1 11/20/2019   Lab Results  Component Value Date   INR 0.90 04/24/2013   Lab Results  Component Value Date   CHOL  08/23/2007    188        ATP III CLASSIFICATION:  <200     mg/dL   Desirable  200-239  mg/dL   Borderline High  >=240    mg/dL   High   HDL 47 08/23/2007   LDLCALC (H) 08/23/2007    128        Total Cholesterol/HDL:CHD Risk Coronary Heart Disease Risk Table                     Men   Women  1/2 Average Risk   3.4   3.3   TRIG 63 08/23/2007    GEN- The patient is well appearing, alert and oriented x 3 today.   HEENT-head normocephalic, atraumatic, sclera clear, conjunctiva pink, hearing intact, trachea midline. Lungs- Clear to ausculation bilaterally, normal work of breathing Heart -regular rate and rhythm, tachycardia, no murmurs, rubs or gallops  GI- soft, NT, ND, + BS Extremities- no clubbing, cyanosis, or edema MS- no significant deformity or atrophy Skin- no rash or lesion Psych- euthymic mood, full affect Neuro- strength and sensation are intact   EKG-  Sinus rhythm with first degree AV block, pr int 218 ms, qrs int 102 ms, qtc 436 ms   Echo 09/19/18 There is a small to moderate pericardial effusion moslty lateral to the RV with no tamponade suggest f/u echo in 6-8 weeks 1. The left ventricle has normal systolic function with an ejection fraction of 60-65%. The cavity size was normal. There is mildly increased left ventricular wall thickness. Left ventricular diastolic parameters were normal.  2. The right ventricle has normal systolic function. The cavity was normal. There is no increase in right ventricular wall thickness.  3. Mild thickening of the mitral valve leaflet.  4. Mild thickening of the aortic valve. Aortic valve regurgitation is trivial by color flow Doppler.  5. There is mild dilatation of the aortic root measuring 39 mm.  Assessment and Plan: 1. Persistent afib  Patient  recently returned to rapid afib. Last DCCV was 10/2017. He  however self converted .  Continue Tikosyn 500 mcg BID, QT stable. Continue diltiazem 180 mg daily Continue  metoprolol succinate 12.5 mg bid  Continue Xarelto 20 mg daily for a CHADS2VASC score of at least 4.  2. Pericardial effusion S/p pericardial window 2015. Repeat echo scheduled.   3. OSA Followed by Dr Maxwell Caul  4. HTN Stable, no changes today.  5. CAD No anginal symptoms.  6. Aortic root enlargement Repeat echo scheduled.    Follow up in the AF clinic in October as scheduled   Geroge Baseman. Adrielle Polakowski, [facility] [address] [place], [place] [address] [phone]

## 2019-12-06 ENCOUNTER — Ambulatory Visit (HOSPITAL_COMMUNITY): Payer: Medicare PPO | Admitting: Nurse Practitioner

## 2019-12-30 ENCOUNTER — Other Ambulatory Visit: Payer: Self-pay | Admitting: Internal Medicine

## 2020-01-09 ENCOUNTER — Ambulatory Visit (HOSPITAL_COMMUNITY)
Admission: RE | Admit: 2020-01-09 | Discharge: 2020-01-09 | Disposition: A | Discharge status: Home / Self Care | Payer: Medicare PPO | Source: Ambulatory Visit | Attending: Nurse Practitioner | Admitting: Nurse Practitioner

## 2020-01-09 ENCOUNTER — Other Ambulatory Visit: Payer: Self-pay

## 2020-01-09 DIAGNOSIS — I429 Cardiomyopathy, unspecified: Secondary | ICD-10-CM | POA: Insufficient documentation

## 2020-01-09 DIAGNOSIS — I4819 Other persistent atrial fibrillation: Secondary | ICD-10-CM | POA: Diagnosis not present

## 2020-01-09 DIAGNOSIS — I082 Rheumatic disorders of both aortic and tricuspid valves: Secondary | ICD-10-CM | POA: Insufficient documentation

## 2020-01-09 DIAGNOSIS — I313 Pericardial effusion (noninflammatory): Secondary | ICD-10-CM | POA: Diagnosis not present

## 2020-01-09 DIAGNOSIS — I712 Thoracic aortic aneurysm, without rupture: Secondary | ICD-10-CM | POA: Insufficient documentation

## 2020-01-09 DIAGNOSIS — E785 Hyperlipidemia, unspecified: Secondary | ICD-10-CM | POA: Diagnosis not present

## 2020-01-09 DIAGNOSIS — I10 Essential (primary) hypertension: Secondary | ICD-10-CM | POA: Insufficient documentation

## 2020-01-09 LAB — ECHOCARDIOGRAM COMPLETE
Area-P 1/2: 3.53 cm2
P 1/2 time: 956 msec
S' Lateral: 3.8 cm

## 2020-01-09 NOTE — Progress Notes (Signed)
  Echocardiogram 2D Echocardiogram has been performed.  Randa Lynn Tyrik Stetzer 01/09/2020, 4:36 PM

## 2020-01-15 ENCOUNTER — Other Ambulatory Visit (HOSPITAL_COMMUNITY): Payer: Self-pay | Admitting: *Deleted

## 2020-01-15 DIAGNOSIS — I712 Thoracic aortic aneurysm, without rupture, unspecified: Secondary | ICD-10-CM

## 2020-01-18 ENCOUNTER — Ambulatory Visit (HOSPITAL_COMMUNITY)
Admission: RE | Admit: 2020-01-18 | Discharge: 2020-01-18 | Disposition: A | Discharge status: Home / Self Care | Payer: Medicare PPO | Source: Ambulatory Visit | Attending: Nurse Practitioner | Admitting: Nurse Practitioner

## 2020-01-18 ENCOUNTER — Other Ambulatory Visit: Payer: Self-pay

## 2020-01-18 ENCOUNTER — Encounter (HOSPITAL_COMMUNITY): Payer: Self-pay | Admitting: Nurse Practitioner

## 2020-01-18 VITALS — BP 116/76 | HR 67 | Ht 73.5 in | Wt 177.2 lb

## 2020-01-18 DIAGNOSIS — I429 Cardiomyopathy, unspecified: Secondary | ICD-10-CM | POA: Insufficient documentation

## 2020-01-18 DIAGNOSIS — I4819 Other persistent atrial fibrillation: Secondary | ICD-10-CM | POA: Diagnosis not present

## 2020-01-18 DIAGNOSIS — Z87891 Personal history of nicotine dependence: Secondary | ICD-10-CM | POA: Insufficient documentation

## 2020-01-18 DIAGNOSIS — I313 Pericardial effusion (noninflammatory): Secondary | ICD-10-CM | POA: Diagnosis not present

## 2020-01-18 DIAGNOSIS — Z79899 Other long term (current) drug therapy: Secondary | ICD-10-CM | POA: Diagnosis not present

## 2020-01-18 DIAGNOSIS — I2510 Atherosclerotic heart disease of native coronary artery without angina pectoris: Secondary | ICD-10-CM | POA: Insufficient documentation

## 2020-01-18 DIAGNOSIS — I712 Thoracic aortic aneurysm, without rupture: Secondary | ICD-10-CM | POA: Diagnosis not present

## 2020-01-18 DIAGNOSIS — Z7901 Long term (current) use of anticoagulants: Secondary | ICD-10-CM | POA: Insufficient documentation

## 2020-01-18 DIAGNOSIS — G4733 Obstructive sleep apnea (adult) (pediatric): Secondary | ICD-10-CM | POA: Diagnosis not present

## 2020-01-18 DIAGNOSIS — I10 Essential (primary) hypertension: Secondary | ICD-10-CM | POA: Diagnosis not present

## 2020-01-18 DIAGNOSIS — D6869 Other thrombophilia: Secondary | ICD-10-CM | POA: Diagnosis not present

## 2020-01-18 DIAGNOSIS — Z8249 Family history of ischemic heart disease and other diseases of the circulatory system: Secondary | ICD-10-CM | POA: Diagnosis not present

## 2020-01-18 DIAGNOSIS — E785 Hyperlipidemia, unspecified: Secondary | ICD-10-CM | POA: Insufficient documentation

## 2020-01-18 LAB — BASIC METABOLIC PANEL
Anion gap: 10 (ref 5–15)
BUN: 21 mg/dL (ref 8–23)
CO2: 28 mmol/L (ref 22–32)
Calcium: 9.3 mg/dL (ref 8.9–10.3)
Chloride: 103 mmol/L (ref 98–111)
Creatinine, Ser: 0.9 mg/dL (ref 0.61–1.24)
GFR, Estimated: >60 mL/min (ref >60)
Glucose, Bld: 102 mg/dL — ABNORMAL HIGH (ref 70–99)
Potassium: 4.2 mmol/L (ref 3.5–5.1)
Sodium: 141 mmol/L (ref 135–145)

## 2020-01-18 NOTE — Progress Notes (Signed)
Primary Care Physician: [name], MD Referring Physician: Dr. Macon Large is a 74 y.o. male with a h/o very complex afib history requiring 80 + cardioversions, ablations and tikosyn, over many years of having afib. Patient has done well from an afib standpoint until this past weekend 11/17/19 when he noticed much more fatigue during exercise. The next morning he checked his HR which was elevated. He is in rapid afib today. There were no specific triggers that he could identify.   F/u in afib clinic, 8/25. He is here to f/u cardioversion, but converted himself the am of cardioversion. 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 EKG shows SR with first degree AV block at 61 bpm.   F/u afib clinic, 01/18/20. He is now back from [place], he had a successful play and felt great. He remains in SR. He did not have any afib in the [place] area. His echo showed mod aortic root dilation and he is pending a CT of the chest on Monday to further evaluate.   Today, he denies symptoms of chest pain, shortness of breath, orthopnea, PND, lower extremity edema, dizziness, presyncope, syncope, or neurologic sequela.  The patient is tolerating medications without difficulties and is otherwise without complaint today.   Past Medical History:  Diagnosis Date  . Cardiomyopathy ([place])    a. 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 b. also hx of tachycardia mediated CM, now resolved  . Complication of anesthesia    Pt has anxiety issues and thinks he is going to die.  . Depression   . Echocardiogram abnormal    EF60%, mild LVH, PA pk pressure 35, pericardial effusion mild increase from 2012   . H/O cardiac catheterization    no significant CAD by cath 5/12  . Hyperlipidemia   . Hypertension   . Limb pain 06/20/2007   LE venous doppler - no evidence of thrombus or thrombophlebitis  . Liver mass    a. CT 03/2013: Small probable cysts in  the liver and small hyperenhancing liver mass.  . OSA (obstructive sleep apnea)    AHI avg 5/hr  . Paroxysmal atrial fibrillation ([facility])    a. s/p afib ablations 2002 and 2009 by Dr Rolland Porter. b. s/p DCCV 02/2013. c. s/p DCCV 01/19/2014  . Pericardial effusion    a. Since 2007. b. s/p pericardial window 04/2013.  Marland Kitchen PVC (premature ventricular contraction) 12/18/2009   cardionet monitor 21 days - some PVCs, ventricular bigeminy  . Thoracic aortic aneurysm ([place])    a. Borderline enlarged by CT 12/14 at 4.0cm.   Past Surgical History:  Procedure Laterality Date  . CARDIAC CATHETERIZATION  08/25/10   NO SIGNIFICANT CAD  . CARDIAC ELECTROPHYSIOLOGY MAPPING AND ABLATION  2001,2009   Dr. Tally Due  . CARDIOVASCULAR STRESS TEST  09/08/2005   R/S MV - EF 61%; Exercises capacity 12 Mets; essentially normal perfusion myocardial scan    . CARDIOVERSION  10/18/2007   successful  . CARDIOVERSION N/A 02/08/2013   Procedure: CARDIOVERSION;  Surgeon: Troy Sine, MD;  Location: [facility];  Service: Cardiovascular;  Laterality: N/A;  . CARDIOVERSION N/A 08/17/2013   Procedure: CARDIOVERSION;  Surgeon: Sanda Klein, MD;  Location: [facility];  Service: Cardiovascular;  Laterality: N/A;  . CARDIOVERSION N/A 12/28/2013   Procedure: CARDIOVERSION;  Surgeon: Fay Records, MD;  Location: [facility];  Service: Cardiovascular;  Laterality:  N/A;  . CARDIOVERSION N/A 01/19/2014   Procedure: CARDIOVERSION;  Surgeon: Sanda Klein, MD;  Location: [facility];  Service: Cardiovascular;  Laterality: N/A;  . CARDIOVERSION N/A 03/11/2014   Procedure: CARDIOVERSION;  Surgeon: Evans Lance, MD;  Location: [facility];  Service: Cardiovascular;  Laterality: N/A;  . CARDIOVERSION N/A 08/23/2014   Procedure: CARDIOVERSION;  Surgeon: Lelon Perla, MD;  Location: [facility] ENDOSCOPY;  Service: Cardiovascular;  Laterality: N/A;  . CARDIOVERSION N/A 10/04/2014   Procedure: CARDIOVERSION;  Surgeon: Sueanne Margarita, MD;  Location:  [facility] ENDOSCOPY;  Service: Cardiovascular;  Laterality: N/A;  . CARDIOVERSION N/A 05/22/2015   Procedure: CARDIOVERSION;  Surgeon: Thayer Headings, MD;  Location: [facility];  Service: Cardiovascular;  Laterality: N/A;  . CARDIOVERSION N/A 10/08/2017   Procedure: CARDIOVERSION;  Surgeon: Pixie Casino, MD;  Location: [facility];  Service: Cardiovascular;  Laterality: N/A;  . CARPAL TUNNEL RELEASE Left April 2015  . ELBOW / UPPER ARM FOREIGN BODY REMOVAL    . ELECTROPHYSIOLOGIC STUDY N/A 09/25/2014   Atrial fibrillation ablation performed by Dr Rayann Heman  . FLEXIBLE SIGMOIDOSCOPY N/A 08/28/2015   Procedure: FLEXIBLE SIGMOIDOSCOPY;  Surgeon: [name], MD;  Location: [facility] ENDOSCOPY;  Service: Endoscopy;  Laterality: N/A;  . FRACTURE SURGERY Left    Left wrist  . HOT HEMOSTASIS N/A 08/28/2015   Procedure: HOT HEMOSTASIS (ARGON PLASMA COAGULATION/BICAP);  Surgeon: [name], MD;  Location: [facility];  Service: Endoscopy;  Laterality: N/A;  . LEFT AND RIGHT HEART CATHETERIZATION WITH CORONARY ANGIOGRAM N/A 03/17/2013   Procedure: LEFT AND RIGHT HEART CATHETERIZATION WITH CORONARY ANGIOGRAM;  Surgeon: Troy Sine, MD;  Location: [facility] CATH LAB;  Service: Cardiovascular;  Laterality: N/A;  . osteosynthesis Left 09/30/2012   with small plate in the ulna  . SUBXYPHOID PERICARDIAL WINDOW N/A 04/25/2013   Procedure: SUBXYPHOID PERICARDIAL WINDOW;  Surgeon: Gaye Pollack, MD;  Location: [facility] OR;  Service: Thoracic;  Laterality: N/A;  . TEE WITHOUT CARDIOVERSION N/A 09/24/2014   Procedure: TRANSESOPHAGEAL ECHOCARDIOGRAM (TEE);  Surgeon: Dorothy Spark, MD;  Location: [facility] ENDOSCOPY;  Service: Cardiovascular;  Laterality: N/A;  . TRANSESOPHAGEAL ECHOCARDIOGRAM  08/26/2010   see Notes tab    Current Outpatient Medications  Medication Sig Dispense Refill  . atorvastatin (LIPITOR) 40 MG tablet TAKE 1 TABLET BY MOUTH EVERY DAY IN THE EVENING (Patient taking differently: Take 40 mg by mouth daily. )  90 tablet 3  . b complex vitamins capsule Take 1 capsule by mouth daily.    Marland Kitchen CALCIUM-MAGNESIUM-ZINC PO Take 3 tablets by mouth daily.     . Cholecalciferol (VITAMIN D) 2000 UNITS CAPS Take 2,000 Units by mouth daily.    Marland Kitchen diltiazem (CARDIZEM CD) 180 MG 24 hr capsule TAKE 1 CAPSULE BY MOUTH EVERY DAY (Patient taking differently: Take 180 mg by mouth daily. ) 90 capsule 0  . dofetilide (TIKOSYN) 500 MCG capsule TAKE ONE CAPSULE BY MOUTH TWICE A DAY 180 capsule 1  . finasteride (PROPECIA) 1 MG tablet Take 1 mg by mouth daily.     . furosemide (LASIX) 40 MG tablet Take 0.5 tablets (20 mg total) by mouth daily. 45 tablet 3  . metoprolol succinate (TOPROL-XL) 25 MG 24 hr tablet Take 0.5 tablets (12.5 mg total) by mouth in the morning and at bedtime. Use for Afib 90 tablet 3  . potassium chloride SA (KLOR-CON M20) 20 MEQ tablet TAKE ONE (1) TABLET (20 MEQ) BY MOUTH DAILY. (Patient taking differently: Take 20 mEq by mouth daily. ) 90  tablet 1  . Saw Palmetto 450 MG CAPS Take 900 mg by mouth in the morning and at bedtime.     Alveda Reasons 20 MG TABS tablet TAKE 1 TABLET (20 MG TOTAL) BY MOUTH DAILY WITH SUPPER. (Patient taking differently: Take 20 mg by mouth daily with supper. ) 90 tablet 1   No current facility-administered medications for this encounter.    Allergies  Allergen Reactions  . Tetracyclines & Related Hives    rash    Social History   Socioeconomic History  . Marital status: Single    Spouse name: Not on file  . Number of children: 0  . Years of education: Not on file  . Highest education level: Not on file  Occupational History    Employer: [employer]  Tobacco Use  . Smoking status: Former Smoker    Packs/day: 1.00    Years: 10.00    Pack years: 10.00    Quit date: 02/06/1989    Years since quitting: 30.9  . Smokeless tobacco: Never Used  Vaping Use  . Vaping Use: Never used  Substance and Sexual Activity  . Alcohol use: No    Comment: 2 bottles of wine per week,  pt states has not drank alcohol since christmas  . Drug use: No  . Sexual activity: Not on file  Other Topics Concern  . 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   Social Determinants of Health   Financial Resource Strain:   . Difficulty of Paying Living Expenses: Not on file  Food Insecurity:   . Worried About Charity fundraiser in the Last Year: Not on file  . Ran Out of Food in the Last Year: Not on file  Transportation Needs:   . Lack of Transportation (Medical): Not on file  . Lack of Transportation (Non-Medical): Not on file  Physical Activity:   . Days of Exercise per Week: Not on file  . Minutes of Exercise per Session: Not on file  Stress:   . Feeling of Stress : Not on file  Social Connections:   . Frequency of Communication with Friends and Family: Not on file  . Frequency of Social Gatherings with Friends and Family: Not on file  . Attends Religious Services: Not on file  . Active Member of Clubs or Organizations: Not on file  . Attends Archivist Meetings: Not on file  . Marital Status: Not on file  Intimate Partner Violence:   . Fear of Current or Ex-Partner: Not on file  . Emotionally Abused: Not on file  . Physically Abused: Not on file  . Sexually Abused: Not on file    Family History  Problem Relation Age of Onset  . Coronary artery disease Father   . Heart attack Father   . Stroke Mother        died  . Atrial fibrillation Brother   . Mitral valve prolapse Sister     ROS- All systems are reviewed and negative except as per the HPI above  Physical Exam: Vitals:   01/18/20 1518  BP: 116/76  Pulse: 67  Weight: 80.4 kg  Height: 6' 1.5" (1.867 m)   Wt Readings from Last 3 Encounters:  01/18/20 80.4 kg  11/29/19 79 kg  11/20/19 78.7 kg     Labs: Lab Results  Component Value Date   NA 139 11/20/2019   K 4.4 11/20/2019   CL 102 11/20/2019   CO2 26 11/20/2019   GLUCOSE 104 (H) 11/20/2019   BUN 21 11/20/2019   CREATININE 0.96 11/20/2019   CALCIUM 9.8 11/20/2019   MG 2.1 11/20/2019   Lab Results  Component Value Date   INR 0.90 04/24/2013   Lab Results  Component Value Date   CHOL  08/23/2007    188        ATP III CLASSIFICATION:  <200     mg/dL   Desirable  200-239  mg/dL   Borderline High  >=240    mg/dL   High   HDL 47 08/23/2007   LDLCALC (H) 08/23/2007    128        Total Cholesterol/HDL:CHD Risk Coronary Heart Disease Risk Table                     Men   Women  1/2 Average Risk   3.4   3.3   TRIG 63 08/23/2007    GEN- The patient is well appearing, alert and oriented x 3 today.   HEENT-head normocephalic, atraumatic, sclera clear, conjunctiva pink, hearing intact, trachea midline. Lungs- Clear to ausculation bilaterally, normal work of breathing Heart -regular rate and rhythm, tachycardia, no murmurs, rubs or gallops  GI- soft, NT, ND, + BS Extremities- no clubbing, cyanosis, or edema MS- no significant deformity or atrophy Skin- no rash or lesion Psych- euthymic mood, full affect Neuro- strength and sensation are intact   EKG-  Sinus rhythm with first degree AV block, 67 bpm,  pr int 222 ms, qrs int 94 ms, qtc 458 ms   Echo 09/19/18 There is a small to moderate pericardial effusion moslty lateral to the RV with no tamponade suggest f/u echo in 6-8 weeks 1. The left ventricle has normal systolic function with an ejection fraction of 60-65%. The cavity size was normal. There is mildly increased left ventricular wall thickness. Left ventricular diastolic parameters were normal.  2. The right ventricle has normal systolic function. The cavity was normal. There is no increase in right ventricular wall thickness.  3. Mild thickening of the mitral valve leaflet.  4. Mild thickening of the aortic  valve. Aortic valve regurgitation is trivial by color flow Doppler.  5. There is mild dilatation of the aortic root measuring 39 mm.  Echo-10/21-1. Moderately dilated aortic root (4.6 cm); suggest CTA or MRA to further  assess.  2. Left ventricular ejection fraction, by estimation, is 55 to 60%. The  left ventricle has normal function. The left ventricle has no regional  wall motion abnormalities. There is mild left ventricular hypertrophy of  the basal-septal segment. Left  ventricular diastolic parameters were normal.  3. Right ventricular systolic function is normal. The right ventricular  size is normal. Tricuspid regurgitation signal is inadequate for assessing  PA pressure.  4. Left atrial size was mildly dilated.  5. The mitral valve is normal in structure. Trivial mitral valve  regurgitation. No evidence of mitral stenosis.  6. The aortic valve is tricuspid. Aortic valve regurgitation is trivial.  Mild aortic valve sclerosis is present, with no evidence of aortic valve  stenosis.  7. Aortic dilatation noted. There is moderate dilatation of the aortic  root, measuring 46 mm.  8. The inferior vena cava is normal in size with greater than 50%  respiratory variability,  suggesting right atrial pressure of 3 mmHg.   Assessment and Plan: 1. Persistent afib  Patient recently returned to rapid afib. Last DCCV was 10/2017. He  however self converted .  Continue Tikosyn 500 mcg BID, QT stable. Continue diltiazem 180 mg daily Continue  metoprolol succinate 12.5 mg bid  Continue Xarelto 20 mg daily for a CHADS2VASC score of at least 4.  2. Pericardial effusion S/p pericardial window 2015. Repeat echo scheduled.   3. OSA Followed by Dr Maxwell Caul  4. HTN Stable, no changes today.  5. CAD No anginal symptoms.  6. Aortic root enlargement Repeat echo showed enlargement Discussed with Dr.Allred and he recommended obtaining a CT of chest with contrast, which is pending on  Monday   Follow up in the AF clinic as needed   [place]. Marvis Saefong, [facility] [address] [place], [place] [address] [phone]

## 2020-01-19 ENCOUNTER — Other Ambulatory Visit: Payer: Medicare PPO

## 2020-01-23 ENCOUNTER — Other Ambulatory Visit: Payer: Self-pay

## 2020-01-23 ENCOUNTER — Ambulatory Visit (INDEPENDENT_AMBULATORY_CARE_PROVIDER_SITE_OTHER)
Admission: RE | Admit: 2020-01-23 | Discharge: 2020-01-23 | Disposition: A | Discharge status: Home / Self Care | Payer: Medicare PPO | Source: Ambulatory Visit | Attending: Internal Medicine | Admitting: Internal Medicine

## 2020-01-23 DIAGNOSIS — I712 Thoracic aortic aneurysm, without rupture, unspecified: Secondary | ICD-10-CM

## 2020-01-23 MED ORDER — IOHEXOL 350 MG/ML SOLN
100.0000 mL | Freq: Once | INTRAVENOUS | Status: AC | PRN
  Administered 2020-01-23: 100 mL via INTRAVENOUS

## 2020-02-14 ENCOUNTER — Other Ambulatory Visit: Payer: Self-pay | Admitting: Internal Medicine

## 2020-02-20 DIAGNOSIS — G4733 Obstructive sleep apnea (adult) (pediatric): Secondary | ICD-10-CM | POA: Diagnosis not present

## 2020-02-28 DIAGNOSIS — Z7901 Long term (current) use of anticoagulants: Secondary | ICD-10-CM | POA: Diagnosis not present

## 2020-02-28 DIAGNOSIS — I480 Paroxysmal atrial fibrillation: Secondary | ICD-10-CM | POA: Diagnosis not present

## 2020-02-28 DIAGNOSIS — E7800 Pure hypercholesterolemia, unspecified: Secondary | ICD-10-CM | POA: Diagnosis not present

## 2020-03-04 DIAGNOSIS — G4733 Obstructive sleep apnea (adult) (pediatric): Secondary | ICD-10-CM | POA: Diagnosis not present

## 2020-03-04 DIAGNOSIS — Z23 Encounter for immunization: Secondary | ICD-10-CM | POA: Diagnosis not present

## 2020-03-04 DIAGNOSIS — N402 Nodular prostate without lower urinary tract symptoms: Secondary | ICD-10-CM | POA: Diagnosis not present

## 2020-03-04 DIAGNOSIS — Z7901 Long term (current) use of anticoagulants: Secondary | ICD-10-CM | POA: Diagnosis not present

## 2020-03-04 DIAGNOSIS — I480 Paroxysmal atrial fibrillation: Secondary | ICD-10-CM | POA: Diagnosis not present

## 2020-03-04 DIAGNOSIS — E7800 Pure hypercholesterolemia, unspecified: Secondary | ICD-10-CM | POA: Diagnosis not present

## 2020-03-04 DIAGNOSIS — I10 Essential (primary) hypertension: Secondary | ICD-10-CM | POA: Diagnosis not present

## 2020-03-04 DIAGNOSIS — Z0000 Encounter for general adult medical examination without abnormal findings: Secondary | ICD-10-CM | POA: Diagnosis not present

## 2020-03-05 ENCOUNTER — Telehealth (HOSPITAL_COMMUNITY): Payer: Self-pay | Admitting: *Deleted

## 2020-03-05 NOTE — Telephone Encounter (Signed)
Patient called in stating he is going in and out of afib. HRs in the low 100s. BP 123/89. Discussed with Roderic Palau NP will try increasing metoprolol to 25mg  BID and follow up later in the week. Pt in agreement.

## 2020-03-07 ENCOUNTER — Other Ambulatory Visit: Payer: Self-pay

## 2020-03-07 ENCOUNTER — Ambulatory Visit (HOSPITAL_COMMUNITY)
Admission: RE | Admit: 2020-03-07 | Discharge: 2020-03-07 | Disposition: A | Discharge status: Home / Self Care | Payer: Medicare PPO | Source: Ambulatory Visit | Attending: Nurse Practitioner | Admitting: Nurse Practitioner

## 2020-03-07 ENCOUNTER — Encounter (HOSPITAL_COMMUNITY): Payer: Self-pay | Admitting: Nurse Practitioner

## 2020-03-07 VITALS — BP 96/74 | HR 128 | Ht 73.5 in | Wt 175.0 lb

## 2020-03-07 DIAGNOSIS — G4733 Obstructive sleep apnea (adult) (pediatric): Secondary | ICD-10-CM | POA: Diagnosis not present

## 2020-03-07 DIAGNOSIS — Z7901 Long term (current) use of anticoagulants: Secondary | ICD-10-CM | POA: Insufficient documentation

## 2020-03-07 DIAGNOSIS — Z79899 Other long term (current) drug therapy: Secondary | ICD-10-CM | POA: Diagnosis not present

## 2020-03-07 DIAGNOSIS — I77819 Aortic ectasia, unspecified site: Secondary | ICD-10-CM | POA: Insufficient documentation

## 2020-03-07 DIAGNOSIS — I10 Essential (primary) hypertension: Secondary | ICD-10-CM | POA: Diagnosis not present

## 2020-03-07 DIAGNOSIS — D6869 Other thrombophilia: Secondary | ICD-10-CM | POA: Diagnosis not present

## 2020-03-07 DIAGNOSIS — I2510 Atherosclerotic heart disease of native coronary artery without angina pectoris: Secondary | ICD-10-CM | POA: Diagnosis not present

## 2020-03-07 DIAGNOSIS — I4819 Other persistent atrial fibrillation: Secondary | ICD-10-CM | POA: Diagnosis not present

## 2020-03-07 LAB — BASIC METABOLIC PANEL
Anion gap: 12 (ref 5–15)
BUN: 22 mg/dL (ref 8–23)
CO2: 26 mmol/L (ref 22–32)
Calcium: 9.7 mg/dL (ref 8.9–10.3)
Chloride: 101 mmol/L (ref 98–111)
Creatinine, Ser: 0.98 mg/dL (ref 0.61–1.24)
GFR, Estimated: >60 mL/min (ref >60)
Glucose, Bld: 96 mg/dL (ref 70–99)
Potassium: 4.1 mmol/L (ref 3.5–5.1)
Sodium: 139 mmol/L (ref 135–145)

## 2020-03-07 LAB — CBC
HCT: 39.6 % (ref 39.0–52.0)
Hemoglobin: 12.4 g/dL — ABNORMAL LOW (ref 13.0–17.0)
MCH: 28 pg (ref 26.0–34.0)
MCHC: 31.3 g/dL (ref 30.0–36.0)
MCV: 89.4 fL (ref 80.0–100.0)
Platelets: 243 10*3/uL (ref 150–400)
RBC: 4.43 MIL/uL (ref 4.22–5.81)
RDW: 13.5 % (ref 11.5–15.5)
WBC: 6.3 10*3/uL (ref 4.0–10.5)
nRBC: 0 % (ref 0.0–0.2)

## 2020-03-07 LAB — MAGNESIUM: Magnesium: 2.3 mg/dL (ref 1.7–2.4)

## 2020-03-07 NOTE — H&P (View-Only) (Signed)
Primary Care Physician: [name], MD Referring Physician: Dr. Macon Large is a 74 y.o. male with a h/o very complex afib history requiring 80 + cardioversions, ablations and tikosyn, over many years of having afib. Patient has done well from an afib standpoint until this past weekend 11/17/19 when he noticed much more fatigue during exercise. The next morning he checked his HR which was elevated. He is in rapid afib today. There were no specific triggers that he could identify.   F/u in afib clinic, 8/25. He is here to f/u cardioversion, but converted himself the am of cardioversion. 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 EKG shows SR with first degree AV block at 61 bpm.   F/u afib clinic, 01/18/20. He is now back from [place], he had a successful play and felt great. He remains in SR. He did not have any afib in the [place] area. His echo showed mod aortic root dilation and he is pending a CT of the chest on Monday to further evaluate.   F/u in afib clinc, 03/07/20.  for return of afib several days ago. I increased his BB as he had RVR. His BP is soft today at 06/74. Ekg shows afib at 124 bpm. No known trigger. No missed anticoagulation  and has had vaccines.   Today, he denies symptoms of chest pain, shortness of breath, orthopnea, PND, lower extremity edema, dizziness, presyncope, syncope, or neurologic sequela.  The patient is tolerating medications without difficulties and is otherwise without complaint today.   Past Medical History:  Diagnosis Date  . Cardiomyopathy ([place])    a. 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 b. also hx of tachycardia mediated CM, now resolved  . Complication of anesthesia    Pt has anxiety issues and thinks he is going to die.  . Depression   . Echocardiogram abnormal    EF60%, mild LVH, PA pk pressure 35, pericardial effusion mild increase from 2012   . H/O cardiac  catheterization    no significant CAD by cath 5/12  . Hyperlipidemia   . Hypertension   . Limb pain 06/20/2007   LE venous doppler - no evidence of thrombus or thrombophlebitis  . Liver mass    a. CT 03/2013: Small probable cysts in the liver and small hyperenhancing liver mass.  . OSA (obstructive sleep apnea)    AHI avg 5/hr  . Paroxysmal atrial fibrillation ([facility])    a. s/p afib ablations 2002 and 2009 by Dr Rolland Porter. b. s/p DCCV 02/2013. c. s/p DCCV 01/19/2014  . Pericardial effusion    a. Since 2007. b. s/p pericardial window 04/2013.  Marland Kitchen PVC (premature ventricular contraction) 12/18/2009   cardionet monitor 21 days - some PVCs, ventricular bigeminy  . Thoracic aortic aneurysm ([place])    a. Borderline enlarged by CT 12/14 at 4.0cm.   Past Surgical History:  Procedure Laterality Date  . CARDIAC CATHETERIZATION  08/25/10   NO SIGNIFICANT CAD  . CARDIAC ELECTROPHYSIOLOGY MAPPING AND ABLATION  2001,2009   Dr. Tally Due  . CARDIOVASCULAR STRESS TEST  09/08/2005   R/S MV - EF 61%; Exercises capacity 12 Mets; essentially normal perfusion myocardial scan    . CARDIOVERSION  10/18/2007   successful  . CARDIOVERSION N/A 02/08/2013   Procedure: CARDIOVERSION;  Surgeon: Troy Sine, MD;  Location: [facility];  Service: Cardiovascular;  Laterality: N/A;  .  CARDIOVERSION N/A 08/17/2013   Procedure: CARDIOVERSION;  Surgeon: Sanda Klein, MD;  Location: [facility];  Service: Cardiovascular;  Laterality: N/A;  . CARDIOVERSION N/A 12/28/2013   Procedure: CARDIOVERSION;  Surgeon: Fay Records, MD;  Location: [facility];  Service: Cardiovascular;  Laterality: N/A;  . CARDIOVERSION N/A 01/19/2014   Procedure: CARDIOVERSION;  Surgeon: Sanda Klein, MD;  Location: [facility] ENDOSCOPY;  Service: Cardiovascular;  Laterality: N/A;  . CARDIOVERSION N/A 03/11/2014   Procedure: CARDIOVERSION;  Surgeon: Evans Lance, MD;  Location: [place];  Service: Cardiovascular;  Laterality: N/A;  . CARDIOVERSION N/A  08/23/2014   Procedure: CARDIOVERSION;  Surgeon: Lelon Perla, MD;  Location: [facility] ENDOSCOPY;  Service: Cardiovascular;  Laterality: N/A;  . CARDIOVERSION N/A 10/04/2014   Procedure: CARDIOVERSION;  Surgeon: Sueanne Margarita, MD;  Location: [facility] ENDOSCOPY;  Service: Cardiovascular;  Laterality: N/A;  . CARDIOVERSION N/A 05/22/2015   Procedure: CARDIOVERSION;  Surgeon: Thayer Headings, MD;  Location: [place];  Service: Cardiovascular;  Laterality: N/A;  . CARDIOVERSION N/A 10/08/2017   Procedure: CARDIOVERSION;  Surgeon: Pixie Casino, MD;  Location: [place];  Service: Cardiovascular;  Laterality: N/A;  . CARPAL TUNNEL RELEASE Left April 2015  . ELBOW / UPPER ARM FOREIGN BODY REMOVAL    . ELECTROPHYSIOLOGIC STUDY N/A 09/25/2014   Atrial fibrillation ablation performed by Dr Rayann Heman  . FLEXIBLE SIGMOIDOSCOPY N/A 08/28/2015   Procedure: FLEXIBLE SIGMOIDOSCOPY;  Surgeon: [name], MD;  Location: [facility] ENDOSCOPY;  Service: Endoscopy;  Laterality: N/A;  . FRACTURE SURGERY Left    Left wrist  . HOT HEMOSTASIS N/A 08/28/2015   Procedure: HOT HEMOSTASIS (ARGON PLASMA COAGULATION/BICAP);  Surgeon: [name], MD;  Location: [facility] ENDOSCOPY;  Service: Endoscopy;  Laterality: N/A;  . LEFT AND RIGHT HEART CATHETERIZATION WITH CORONARY ANGIOGRAM N/A 03/17/2013   Procedure: LEFT AND RIGHT HEART CATHETERIZATION WITH CORONARY ANGIOGRAM;  Surgeon: Troy Sine, MD;  Location: [facility] CATH LAB;  Service: Cardiovascular;  Laterality: N/A;  . osteosynthesis Left 09/30/2012   with small plate in the ulna  . SUBXYPHOID PERICARDIAL WINDOW N/A 04/25/2013   Procedure: SUBXYPHOID PERICARDIAL WINDOW;  Surgeon: Gaye Pollack, MD;  Location: [facility] OR;  Service: Thoracic;  Laterality: N/A;  . TEE WITHOUT CARDIOVERSION N/A 09/24/2014   Procedure: TRANSESOPHAGEAL ECHOCARDIOGRAM (TEE);  Surgeon: Dorothy Spark, MD;  Location: [facility] ENDOSCOPY;  Service: Cardiovascular;  Laterality: N/A;  . TRANSESOPHAGEAL ECHOCARDIOGRAM   08/26/2010   see Notes tab    Current Outpatient Medications  Medication Sig Dispense Refill  . atorvastatin (LIPITOR) 40 MG tablet TAKE 1 TABLET BY MOUTH EVERY DAY IN THE EVENING (Patient taking differently: Take 40 mg by mouth daily. ) 90 tablet 3  . b complex vitamins capsule Take 1 capsule by mouth daily.    Marland Kitchen CALCIUM-MAGNESIUM-ZINC PO Take 3 tablets by mouth daily.     . Cholecalciferol (VITAMIN D) 2000 UNITS CAPS Take 2,000 Units by mouth daily.    Marland Kitchen diltiazem (CARDIZEM CD) 180 MG 24 hr capsule TAKE 1 CAPSULE BY MOUTH EVERY DAY (Patient taking differently: Take 180 mg by mouth daily. ) 90 capsule 0  . dofetilide (TIKOSYN) 500 MCG capsule TAKE ONE CAPSULE BY MOUTH TWICE A DAY 180 capsule 1  . finasteride (PROPECIA) 1 MG tablet Take 1 mg by mouth daily.     . furosemide (LASIX) 40 MG tablet Take 0.5 tablets (20 mg total) by mouth daily. 45 tablet 3  . Liver Extract (LIVER PO) Take 1 tablet by mouth daily- Liver vitamin -  Natures Bounty    . metoprolol succinate (TOPROL-XL) 25 MG 24 hr tablet Take 0.5 tablets (12.5 mg total) by mouth in the morning and at bedtime. Use for Afib (Patient taking differently: Take 25 mg by mouth in the morning and at bedtime. Use for Afib) 90 tablet 3  . potassium chloride SA (KLOR-CON M20) 20 MEQ tablet TAKE ONE (1) TABLET (20 MEQ) BY MOUTH DAILY. 90 tablet 1  . Probiotic Product (PROBIOTIC DAILY PO) Take 1 capsule by mouth daily.    . Saw Palmetto 450 MG CAPS Take 900 mg by mouth in the morning and at bedtime.     Alveda Reasons 20 MG TABS tablet TAKE 1 TABLET (20 MG TOTAL) BY MOUTH DAILY WITH SUPPER. (Patient taking differently: Take 20 mg by mouth daily with supper. ) 90 tablet 1   No current facility-administered medications for this encounter.    Allergies  Allergen Reactions  . Tetracyclines & Related Hives    rash    Social History   Socioeconomic History  . Marital status: Single    Spouse name: Not on file  . Number of children: 0  . Years of  education: Not on file  . Highest education level: Not on file  Occupational History    Employer: [employer]  Tobacco Use  . Smoking status: Former Smoker    Packs/day: 1.00    Years: 10.00    Pack years: 10.00    Quit date: 02/06/1989    Years since quitting: 31.1  . Smokeless tobacco: Never Used  Vaping Use  . Vaping Use: Never used  Substance and Sexual Activity  . Alcohol use: No    Comment: 2 bottles of wine per week, pt states has not drank alcohol since christmas  . Drug use: No  . Sexual activity: Not on file  Other Topics Concern  . 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   Social Determinants of Health   Financial Resource Strain:   . Difficulty of Paying Living Expenses: Not on file  Food Insecurity:   . Worried About Charity fundraiser in the Last Year: Not on file  . Ran Out of Food in the Last Year: Not on file  Transportation Needs:   . Lack of Transportation (Medical): Not on file  . Lack of Transportation (Non-Medical): Not on file  Physical Activity:   . Days of Exercise per Week: Not on file  . Minutes of Exercise per Session: Not on file  Stress:   . Feeling of Stress : Not on file  Social Connections:   . Frequency of Communication with Friends and Family: Not on file  . Frequency of Social Gatherings with Friends and Family: Not on file  . Attends Religious Services: Not on file  . Active Member of Clubs or Organizations: Not on file  . Attends Archivist Meetings: Not on file  . Marital Status: Not on file  Intimate Partner Violence:   . Fear of Current or Ex-Partner: Not on file  . Emotionally Abused: Not on file  . Physically Abused: Not on file  . Sexually Abused: Not on file    Family History  Problem Relation Age of Onset  .  Coronary artery disease Father   .  Heart attack Father   . Stroke Mother        died  . Atrial fibrillation Brother   . Mitral valve prolapse Sister     ROS- All systems are reviewed and negative except as per the HPI above  Physical Exam: Vitals:   03/07/20 1537  BP: 96/74  Pulse: (!) 128  Weight: 79.4 kg  Height: 6' 1.5" (1.867 m)   Wt Readings from Last 3 Encounters:  03/07/20 79.4 kg  01/18/20 80.4 kg  11/29/19 79 kg    Labs: Lab Results  Component Value Date   NA 141 01/18/2020   K 4.2 01/18/2020   CL 103 01/18/2020   CO2 28 01/18/2020   GLUCOSE 102 (H) 01/18/2020   BUN 21 01/18/2020   CREATININE 0.90 01/18/2020   CALCIUM 9.3 01/18/2020   MG 2.1 11/20/2019   Lab Results  Component Value Date   INR 0.90 04/24/2013   Lab Results  Component Value Date   CHOL  08/23/2007    188        ATP III CLASSIFICATION:  <200     mg/dL   Desirable  200-239  mg/dL   Borderline High  >=240    mg/dL   High   HDL 47 08/23/2007   LDLCALC (H) 08/23/2007    128        Total Cholesterol/HDL:CHD Risk Coronary Heart Disease Risk Table                     Men   Women  1/2 Average Risk   3.4   3.3   TRIG 63 08/23/2007    GEN- The patient is well appearing, alert and oriented x 3 today.   HEENT-head normocephalic, atraumatic, sclera clear, conjunctiva pink, hearing intact, trachea midline. Lungs- Clear to ausculation bilaterally, normal work of breathing Heart -regular rate and rhythm, tachycardia, no murmurs, rubs or gallops  GI- soft, NT, ND, + BS Extremities- no clubbing, cyanosis, or edema MS- no significant deformity or atrophy Skin- no rash or lesion Psych- euthymic mood, full affect Neuro- strength and sensation are intact   EKG-  Sinus rhythm with first degree AV block, 67 bpm,  pr int 222 ms, qrs int 94 ms, qtc 458 ms   Echo IMPRESSIONS    1. Moderately dilated aortic root (4.6 cm); suggest CTA or MRA to further  assess.  2. Left ventricular ejection  fraction, by estimation, is 55 to 60%. The  left ventricle has normal function. The left ventricle has no regional  wall motion abnormalities. There is mild left ventricular hypertrophy of  the basal-septal segment. Left  ventricular diastolic parameters were normal.  3. Right ventricular systolic function is normal. The right ventricular  size is normal. Tricuspid regurgitation signal is inadequate for assessing  PA pressure.  4. Left atrial size was mildly dilated.  5. The mitral valve is normal in structure. Trivial mitral valve  regurgitation. No evidence of mitral stenosis.  6. The aortic valve is tricuspid. Aortic valve regurgitation is trivial.  Mild aortic valve sclerosis is present, with no evidence of aortic valve  stenosis.  7. Aortic dilatation noted. There is moderate dilatation of the aortic  root, measuring 46 mm.  8. The inferior vena cava is normal in size with greater than 50%  respiratory variability, suggesting right atrial pressure of 3 mmHg.   CT 01/24/20-IMPRESSION: Stable dilatation of the ascending aorta 3.9 cm. Given some motion artifact this is felt to be  stable in appearance from the prior exam at which time it measured 4 cm. Recommend annual imaging followup by CTA or MRA. This recommendation follows 2010 ACCF/AHA/AATS/ACR/ASA/SCA/SCAI/SIR/STS/SVM Guidelines for the Diagnosis and Management of Patients with Thoracic Aortic Disease. Circulation. 2010; 121: M546-T035. Aortic aneurysm NOS (ICD10-I71.9)  Stable parenchymal nodules in the lungs bilaterally. Given their long-term stability since 2016 there are benign in etiology. No further follow-up is necessary.  Hyperdense lesion within the left lobe of the liver is stable. Given its stability from 2014, this is felt to be benign in etiology.  Assessment and Plan: 1. Persistent afib  Patient recently returned to rapid afib. Last DCCV was 10/2017. Will schedule for cardioversion  Continue Tikosyn  500 mcg BID  Continue diltiazem 180 mg daily Continue  metoprolol succinate  at 25 mg in am,  12.5 mg pm If BP gets softer, decrease back to 12.5 mg bid, he will go back to this dose am of CV Continue Xarelto 20 mg daily for a CHADS2VASC score of at least 4, states no missed doses for at least 3 weeks  Cbc/bmet/mag/covid testin  2. Dilated aortic root  Recent CT shows stable dilation Will need yearly tests   3. OSA Followed by Dr Maxwell Caul  4. HTN Soft today Decrease BB to 25 mg in am and 12.5 mg pm  5. CAD No anginal symptoms.   Follow up in the AF clinic as needed   Butch Penny C. Saira Kramme, [facility] [address] [place], [place] [address] [phone]

## 2020-03-07 NOTE — Patient Instructions (Addendum)
Cardioversion scheduled for Wednesday, December 8th  - Arrive at the [facility] and go to admitting at 830AM  - Do not eat or drink anything after midnight the night prior to your procedure.  - Take all your morning medication (except diabetic medications) with a sip of water prior to arrival.  - You will not be able to drive home after your procedure.  - Do NOT miss any doses of your blood thinner - if you should miss a dose please notify our office immediately.  - If you feel as if you go back into normal rhythm prior to scheduled cardioversion, please notify our office immediately. If your procedure is canceled in the cardioversion suite you will be charged a cancellation fee.  Metoprolol to 25mg  in the morning and 12.5mg  in the evening until day of cardioversion then go back to 12.5mg  twice a day

## 2020-03-07 NOTE — Progress Notes (Signed)
Primary Care Physician: [name], MD Referring Physician: Dr. Macon Large is a 74 y.o. male with a h/o very complex afib history requiring 80 + cardioversions, ablations and tikosyn, over many years of having afib. Patient has done well from an afib standpoint until this past weekend 11/17/19 when he noticed much more fatigue during exercise. The next morning he checked his HR which was elevated. He is in rapid afib today. There were no specific triggers that he could identify.   F/u in afib clinic, 8/25. He is here to f/u cardioversion, but converted himself the am of cardioversion. 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 EKG shows SR with first degree AV block at 61 bpm.   F/u afib clinic, 01/18/20. He is now back from [place], he had a successful play and felt great. He remains in SR. He did not have any afib in the [place] area. His echo showed mod aortic root dilation and he is pending a CT of the chest on Monday to further evaluate.   F/u in afib clinc, 03/07/20.  for return of afib several days ago. I increased his BB as he had RVR. His BP is soft today at 06/74. Ekg shows afib at 124 bpm. No known trigger. No missed anticoagulation  and has had vaccines.   Today, he denies symptoms of chest pain, shortness of breath, orthopnea, PND, lower extremity edema, dizziness, presyncope, syncope, or neurologic sequela.  The patient is tolerating medications without difficulties and is otherwise without complaint today.   Past Medical History:  Diagnosis Date  . Cardiomyopathy ([place])    a. 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 b. also hx of tachycardia mediated CM, now resolved  . Complication of anesthesia    Pt has anxiety issues and thinks he is going to die.  . Depression   . Echocardiogram abnormal    EF60%, mild LVH, PA pk pressure 35, pericardial effusion mild increase from 2012   . H/O cardiac  catheterization    no significant CAD by cath 5/12  . Hyperlipidemia   . Hypertension   . Limb pain 06/20/2007   LE venous doppler - no evidence of thrombus or thrombophlebitis  . Liver mass    a. CT 03/2013: Small probable cysts in the liver and small hyperenhancing liver mass.  . OSA (obstructive sleep apnea)    AHI avg 5/hr  . Paroxysmal atrial fibrillation ([facility])    a. s/p afib ablations 2002 and 2009 by Dr Rolland Porter. b. s/p DCCV 02/2013. c. s/p DCCV 01/19/2014  . Pericardial effusion    a. Since 2007. b. s/p pericardial window 04/2013.  Marland Kitchen PVC (premature ventricular contraction) 12/18/2009   cardionet monitor 21 days - some PVCs, ventricular bigeminy  . Thoracic aortic aneurysm ([place])    a. Borderline enlarged by CT 12/14 at 4.0cm.   Past Surgical History:  Procedure Laterality Date  . CARDIAC CATHETERIZATION  08/25/10   NO SIGNIFICANT CAD  . CARDIAC ELECTROPHYSIOLOGY MAPPING AND ABLATION  2001,2009   Dr. Tally Due  . CARDIOVASCULAR STRESS TEST  09/08/2005   R/S MV - EF 61%; Exercises capacity 12 Mets; essentially normal perfusion myocardial scan    . CARDIOVERSION  10/18/2007   successful  . CARDIOVERSION N/A 02/08/2013   Procedure: CARDIOVERSION;  Surgeon: Troy Sine, MD;  Location: [place];  Service: Cardiovascular;  Laterality: N/A;  .  CARDIOVERSION N/A 08/17/2013   Procedure: CARDIOVERSION;  Surgeon: Sanda Klein, MD;  Location: [place];  Service: Cardiovascular;  Laterality: N/A;  . CARDIOVERSION N/A 12/28/2013   Procedure: CARDIOVERSION;  Surgeon: Fay Records, MD;  Location: [place];  Service: Cardiovascular;  Laterality: N/A;  . CARDIOVERSION N/A 01/19/2014   Procedure: CARDIOVERSION;  Surgeon: Sanda Klein, MD;  Location: [facility] ENDOSCOPY;  Service: Cardiovascular;  Laterality: N/A;  . CARDIOVERSION N/A 03/11/2014   Procedure: CARDIOVERSION;  Surgeon: Evans Lance, MD;  Location: [place];  Service: Cardiovascular;  Laterality: N/A;  . CARDIOVERSION N/A  08/23/2014   Procedure: CARDIOVERSION;  Surgeon: Lelon Perla, MD;  Location: [facility] ENDOSCOPY;  Service: Cardiovascular;  Laterality: N/A;  . CARDIOVERSION N/A 10/04/2014   Procedure: CARDIOVERSION;  Surgeon: Sueanne Margarita, MD;  Location: [facility] ENDOSCOPY;  Service: Cardiovascular;  Laterality: N/A;  . CARDIOVERSION N/A 05/22/2015   Procedure: CARDIOVERSION;  Surgeon: Thayer Headings, MD;  Location: [place];  Service: Cardiovascular;  Laterality: N/A;  . CARDIOVERSION N/A 10/08/2017   Procedure: CARDIOVERSION;  Surgeon: Pixie Casino, MD;  Location: [place];  Service: Cardiovascular;  Laterality: N/A;  . CARPAL TUNNEL RELEASE Left April 2015  . ELBOW / UPPER ARM FOREIGN BODY REMOVAL    . ELECTROPHYSIOLOGIC STUDY N/A 09/25/2014   Atrial fibrillation ablation performed by Dr Rayann Heman  . FLEXIBLE SIGMOIDOSCOPY N/A 08/28/2015   Procedure: FLEXIBLE SIGMOIDOSCOPY;  Surgeon: [name], MD;  Location: [facility] ENDOSCOPY;  Service: Endoscopy;  Laterality: N/A;  . FRACTURE SURGERY Left    Left wrist  . HOT HEMOSTASIS N/A 08/28/2015   Procedure: HOT HEMOSTASIS (ARGON PLASMA COAGULATION/BICAP);  Surgeon: [name], MD;  Location: [facility] ENDOS[facility];  Service: Endoscopy;  Laterality: N/A;  . LEFT AND RIGHT HEART CATHETERIZATION WITH CORONARY ANGIOGRAM N/A 03/17/2013   Procedure: LEFT AND RIGHT HEART CATHETERIZATION WITH CORONARY ANGIOGRAM;  Surgeon: Troy Sine, MD;  Location: [facility] CATH LAB;  Service: Cardiovascular;  Laterality: N/A;  . osteosynthesis Left 09/30/2012   with small plate in the ulna  . SUBXYPHOID PERICARDIAL WINDOW N/A 04/25/2013   Procedure: SUBXYPHOID PERICARDIAL WINDOW;  Surgeon: Gaye Pollack, MD;  Location: [facility] OR;  Service: Thoracic;  Laterality: N/A;  . TEE WITHOUT CARDIOVERSION N/A 09/24/2014   Procedure: TRANSESOPHAGEAL ECHOCARDIOGRAM (TEE);  Surgeon: Dorothy Spark, MD;  Location: [facility] ENDOSCOPY;  Service: Cardiovascular;  Laterality: N/A;  . TRANSESOPHAGEAL ECHOCARDIOGRAM   08/26/2010   see Notes tab    Current Outpatient Medications  Medication Sig Dispense Refill  . atorvastatin (LIPITOR) 40 MG tablet TAKE 1 TABLET BY MOUTH EVERY DAY IN THE EVENING (Patient taking differently: Take 40 mg by mouth daily. ) 90 tablet 3  . b complex vitamins capsule Take 1 capsule by mouth daily.    Marland Kitchen CALCIUM-MAGNESIUM-ZINC PO Take 3 tablets by mouth daily.     . Cholecalciferol (VITAMIN D) 2000 UNITS CAPS Take 2,000 Units by mouth daily.    Marland Kitchen diltiazem (CARDIZEM CD) 180 MG 24 hr capsule TAKE 1 CAPSULE BY MOUTH EVERY DAY (Patient taking differently: Take 180 mg by mouth daily. ) 90 capsule 0  . dofetilide (TIKOSYN) 500 MCG capsule TAKE ONE CAPSULE BY MOUTH TWICE A DAY 180 capsule 1  . finasteride (PROPECIA) 1 MG tablet Take 1 mg by mouth daily.     . furosemide (LASIX) 40 MG tablet Take 0.5 tablets (20 mg total) by mouth daily. 45 tablet 3  . Liver Extract (LIVER PO) Take 1 tablet by mouth daily- Liver vitamin -  Natures Bounty    . metoprolol succinate (TOPROL-XL) 25 MG 24 hr tablet Take 0.5 tablets (12.5 mg total) by mouth in the morning and at bedtime. Use for Afib (Patient taking differently: Take 25 mg by mouth in the morning and at bedtime. Use for Afib) 90 tablet 3  . potassium chloride SA (KLOR-CON M20) 20 MEQ tablet TAKE ONE (1) TABLET (20 MEQ) BY MOUTH DAILY. 90 tablet 1  . Probiotic Product (PROBIOTIC DAILY PO) Take 1 capsule by mouth daily.    . Saw Palmetto 450 MG CAPS Take 900 mg by mouth in the morning and at bedtime.     Alveda Reasons 20 MG TABS tablet TAKE 1 TABLET (20 MG TOTAL) BY MOUTH DAILY WITH SUPPER. (Patient taking differently: Take 20 mg by mouth daily with supper. ) 90 tablet 1   No current facility-administered medications for this encounter.    Allergies  Allergen Reactions  . Tetracyclines & Related Hives    rash    Social History   Socioeconomic History  . Marital status: Single    Spouse name: Not on file  . Number of children: 0  . Years of  education: Not on file  . Highest education level: Not on file  Occupational History    Employer: [employer]  Tobacco Use  . Smoking status: Former Smoker    Packs/day: 1.00    Years: 10.00    Pack years: 10.00    Quit date: 02/06/1989    Years since quitting: 31.1  . Smokeless tobacco: Never Used  Vaping Use  . Vaping Use: Never used  Substance and Sexual Activity  . Alcohol use: No    Comment: 2 bottles of wine per week, pt states has not drank alcohol since christmas  . Drug use: No  . Sexual activity: Not on file  Other Topics Concern  . 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   Social Determinants of Health   Financial Resource Strain:   . Difficulty of Paying Living Expenses: Not on file  Food Insecurity:   . Worried About Charity fundraiser in the Last Year: Not on file  . Ran Out of Food in the Last Year: Not on file  Transportation Needs:   . Lack of Transportation (Medical): Not on file  . Lack of Transportation (Non-Medical): Not on file  Physical Activity:   . Days of Exercise per Week: Not on file  . Minutes of Exercise per Session: Not on file  Stress:   . Feeling of Stress : Not on file  Social Connections:   . Frequency of Communication with Friends and Family: Not on file  . Frequency of Social Gatherings with Friends and Family: Not on file  . Attends Religious Services: Not on file  . Active Member of Clubs or Organizations: Not on file  . Attends Archivist Meetings: Not on file  . Marital Status: Not on file  Intimate Partner Violence:   . Fear of Current or Ex-Partner: Not on file  . Emotionally Abused: Not on file  . Physically Abused: Not on file  . Sexually Abused: Not on file    Family History  Problem Relation Age of Onset  .  Coronary artery disease Father   .  Heart attack Father   . Stroke Mother        died  . Atrial fibrillation Brother   . Mitral valve prolapse Sister     ROS- All systems are reviewed and negative except as per the HPI above  Physical Exam: Vitals:   03/07/20 1537  BP: 96/74  Pulse: (!) 128  Weight: 79.4 kg  Height: 6' 1.5" (1.867 m)   Wt Readings from Last 3 Encounters:  03/07/20 79.4 kg  01/18/20 80.4 kg  11/29/19 79 kg    Labs: Lab Results  Component Value Date   NA 141 01/18/2020   K 4.2 01/18/2020   CL 103 01/18/2020   CO2 28 01/18/2020   GLUCOSE 102 (H) 01/18/2020   BUN 21 01/18/2020   CREATININE 0.90 01/18/2020   CALCIUM 9.3 01/18/2020   MG 2.1 11/20/2019   Lab Results  Component Value Date   INR 0.90 04/24/2013   Lab Results  Component Value Date   CHOL  08/23/2007    188        ATP III CLASSIFICATION:  <200     mg/dL   Desirable  200-239  mg/dL   Borderline High  >=240    mg/dL   High   HDL 47 08/23/2007   LDLCALC (H) 08/23/2007    128        Total Cholesterol/HDL:CHD Risk Coronary Heart Disease Risk Table                     Men   Women  1/2 Average Risk   3.4   3.3   TRIG 63 08/23/2007    GEN- The patient is well appearing, alert and oriented x 3 today.   HEENT-head normocephalic, atraumatic, sclera clear, conjunctiva pink, hearing intact, trachea midline. Lungs- Clear to ausculation bilaterally, normal work of breathing Heart -regular rate and rhythm, tachycardia, no murmurs, rubs or gallops  GI- soft, NT, ND, + BS Extremities- no clubbing, cyanosis, or edema MS- no significant deformity or atrophy Skin- no rash or lesion Psych- euthymic mood, full affect Neuro- strength and sensation are intact   EKG-  Sinus rhythm with first degree AV block, 67 bpm,  pr int 222 ms, qrs int 94 ms, qtc 458 ms   Echo IMPRESSIONS    1. Moderately dilated aortic root (4.6 cm); suggest CTA or MRA to further  assess.  2. Left ventricular ejection  fraction, by estimation, is 55 to 60%. The  left ventricle has normal function. The left ventricle has no regional  wall motion abnormalities. There is mild left ventricular hypertrophy of  the basal-septal segment. Left  ventricular diastolic parameters were normal.  3. Right ventricular systolic function is normal. The right ventricular  size is normal. Tricuspid regurgitation signal is inadequate for assessing  PA pressure.  4. Left atrial size was mildly dilated.  5. The mitral valve is normal in structure. Trivial mitral valve  regurgitation. No evidence of mitral stenosis.  6. The aortic valve is tricuspid. Aortic valve regurgitation is trivial.  Mild aortic valve sclerosis is present, with no evidence of aortic valve  stenosis.  7. Aortic dilatation noted. There is moderate dilatation of the aortic  root, measuring 46 mm.  8. The inferior vena cava is normal in size with greater than 50%  respiratory variability, suggesting right atrial pressure of 3 mmHg.   CT 01/24/20-IMPRESSION: Stable dilatation of the ascending aorta 3.9 cm. Given some motion artifact this is felt to be  stable in appearance from the prior exam at which time it measured 4 cm. Recommend annual imaging followup by CTA or MRA. This recommendation follows 2010 ACCF/AHA/AATS/ACR/ASA/SCA/SCAI/SIR/STS/SVM Guidelines for the Diagnosis and Management of Patients with Thoracic Aortic Disease. Circulation. 2010; 121: K599-H741. Aortic aneurysm NOS (ICD10-I71.9)  Stable parenchymal nodules in the lungs bilaterally. Given their long-term stability since 2016 there are benign in etiology. No further follow-up is necessary.  Hyperdense lesion within the left lobe of the liver is stable. Given its stability from 2014, this is felt to be benign in etiology.  Assessment and Plan: 1. Persistent afib  Patient recently returned to rapid afib. Last DCCV was 10/2017. Will schedule for cardioversion  Continue Tikosyn  500 mcg BID  Continue diltiazem 180 mg daily Continue  metoprolol succinate  at 25 mg in am,  12.5 mg pm If BP gets softer, decrease back to 12.5 mg bid, he will go back to this dose am of CV Continue Xarelto 20 mg daily for a CHADS2VASC score of at least 4, states no missed doses for at least 3 weeks  Cbc/bmet/mag/covid testin  2. Dilated aortic root  Recent CT shows stable dilation Will need yearly tests   3. OSA Followed by Dr Maxwell Caul  4. HTN Soft today Decrease BB to 25 mg in am and 12.5 mg pm  5. CAD No anginal symptoms.   Follow up in the AF clinic as needed   Butch Penny C. Keona Sheffler, [facility] [address] [place], [place] [address] [phone]

## 2020-03-11 ENCOUNTER — Other Ambulatory Visit (HOSPITAL_COMMUNITY)
Admission: RE | Admit: 2020-03-11 | Discharge: 2020-03-11 | Disposition: A | Discharge status: Home / Self Care | Payer: Medicare PPO | Source: Ambulatory Visit | Attending: Cardiology | Admitting: Cardiology

## 2020-03-11 DIAGNOSIS — Z01812 Encounter for preprocedural laboratory examination: Secondary | ICD-10-CM | POA: Diagnosis not present

## 2020-03-11 DIAGNOSIS — Z20822 Contact with and (suspected) exposure to covid-19: Secondary | ICD-10-CM | POA: Insufficient documentation

## 2020-03-11 LAB — SARS CORONAVIRUS 2 (TAT 6-24 HRS): SARS Coronavirus 2: NEGATIVE

## 2020-03-13 ENCOUNTER — Ambulatory Visit (HOSPITAL_COMMUNITY): Payer: Medicare PPO | Admitting: Certified Registered"

## 2020-03-13 ENCOUNTER — Encounter (HOSPITAL_COMMUNITY)
Admission: RE | Disposition: A | Discharge status: Home / Self Care | Payer: Self-pay | Source: Home / Self Care | Attending: Cardiology

## 2020-03-13 ENCOUNTER — Ambulatory Visit (HOSPITAL_COMMUNITY)
Admission: RE | Admit: 2020-03-13 | Discharge: 2020-03-13 | Disposition: A | Discharge status: Home / Self Care | Payer: Medicare PPO | Attending: Cardiology | Admitting: Cardiology

## 2020-03-13 ENCOUNTER — Other Ambulatory Visit: Payer: Self-pay

## 2020-03-13 ENCOUNTER — Encounter (HOSPITAL_COMMUNITY): Payer: Self-pay | Admitting: Cardiology

## 2020-03-13 DIAGNOSIS — G4733 Obstructive sleep apnea (adult) (pediatric): Secondary | ICD-10-CM | POA: Insufficient documentation

## 2020-03-13 DIAGNOSIS — I4819 Other persistent atrial fibrillation: Secondary | ICD-10-CM | POA: Diagnosis not present

## 2020-03-13 DIAGNOSIS — Z7901 Long term (current) use of anticoagulants: Secondary | ICD-10-CM | POA: Diagnosis not present

## 2020-03-13 DIAGNOSIS — Z79899 Other long term (current) drug therapy: Secondary | ICD-10-CM | POA: Insufficient documentation

## 2020-03-13 DIAGNOSIS — I480 Paroxysmal atrial fibrillation: Secondary | ICD-10-CM | POA: Diagnosis not present

## 2020-03-13 DIAGNOSIS — I313 Pericardial effusion (noninflammatory): Secondary | ICD-10-CM | POA: Diagnosis not present

## 2020-03-13 DIAGNOSIS — Z87891 Personal history of nicotine dependence: Secondary | ICD-10-CM | POA: Diagnosis not present

## 2020-03-13 DIAGNOSIS — I2510 Atherosclerotic heart disease of native coronary artery without angina pectoris: Secondary | ICD-10-CM | POA: Insufficient documentation

## 2020-03-13 DIAGNOSIS — I10 Essential (primary) hypertension: Secondary | ICD-10-CM | POA: Diagnosis not present

## 2020-03-13 HISTORY — PX: CARDIOVERSION: SHX1299

## 2020-03-13 SURGERY — CARDIOVERSION
Anesthesia: General

## 2020-03-13 MED ORDER — LIDOCAINE 2% (20 MG/ML) 5 ML SYRINGE
INTRAMUSCULAR | Status: DC | PRN
  Administered 2020-03-13: 40 mg via INTRAVENOUS

## 2020-03-13 MED ORDER — PROPOFOL 10 MG/ML IV BOLUS
INTRAVENOUS | Status: DC | PRN
  Administered 2020-03-13: 80 mg via INTRAVENOUS

## 2020-03-13 NOTE — Procedures (Signed)
Procedure: Electrical Cardioversion Indications:  Atrial Fibrillation  Procedure Details:  Consent: Risks of procedure as well as the alternatives and risks of each were explained to the (patient/caregiver).  Consent for procedure obtained.  Time Out: Verified patient identification, verified procedure, site/side was marked, verified correct patient position, special equipment/implants available, medications/allergies/relevent history reviewed, required imaging and test results available. PERFORMED.  Patient placed on cardiac monitor, pulse oximetry, supplemental oxygen as necessary.  Sedation given: lidocaine 40mg ; propofol 80mg  Pacer pads placed anterior and posterior chest.  Cardioverted 1 time(s).  Cardioversion with synchronized biphasic 150J shock.  Evaluation: Findings: Post procedure EKG shows: NSR Complications: None Patient did tolerate procedure well.  Time Spent Directly with the Patient:  17minutes   [name] 03/13/2020, 9:25 AM

## 2020-03-13 NOTE — Transfer of Care (Signed)
Immediate Anesthesia Transfer of Care Note  Patient: [name]  Procedure(s) Performed: CARDIOVERSION (N/A )  Patient Location: Endoscopy Unit  Anesthesia Type:General  Level of Consciousness: drowsy and patient cooperative  Airway & Oxygen Therapy: Patient Spontanous Breathing and Patient connected to nasal cannula oxygen  Post-op Assessment: Report given to RN, Post -op Vital signs reviewed and stable and Patient moving all extremities  Post vital signs: Reviewed and stable  Last Vitals:  Vitals Value Taken Time  BP    Temp    Pulse    Resp    SpO2      Last Pain:  Vitals:   03/13/20 0836  TempSrc: Temporal         Complications: No complications documented.

## 2020-03-13 NOTE — Anesthesia Preprocedure Evaluation (Signed)
Anesthesia Evaluation  Patient identified by MRN, date of birth, ID band Patient awake    Reviewed: Allergy & Precautions, NPO status , Patient's Chart, lab work & pertinent test results  Airway Mallampati: II  TM Distance: >3 FB     Dental   Pulmonary sleep apnea , former smoker,    breath sounds clear to auscultation       Cardiovascular hypertension, + dysrhythmias Atrial Fibrillation  Rhythm:Irregular Rate:Normal     Neuro/Psych Anxiety Depression    GI/Hepatic Neg liver ROS,   Endo/Other  negative endocrine ROS  Renal/GU negative Renal ROS     Musculoskeletal   Abdominal   Peds  Hematology   Anesthesia Other Findings   Reproductive/Obstetrics                             Anesthesia Physical Anesthesia Plan  ASA: III  Anesthesia Plan: General   Post-op Pain Management:    Induction: Intravenous  PONV Risk Score and Plan: 2 and Propofol infusion  Airway Management Planned: Nasal Cannula and Simple Face Mask  Additional Equipment:   Intra-op Plan:   Post-operative Plan:   Informed Consent: I have reviewed the patients History and Physical, chart, labs and discussed the procedure including the risks, benefits and alternatives for the proposed anesthesia with the patient or authorized representative who has indicated his/her understanding and acceptance.     Dental advisory given  Plan Discussed with: CRNA and Anesthesiologist  Anesthesia Plan Comments:         Anesthesia Quick Evaluation

## 2020-03-13 NOTE — Discharge Instructions (Signed)
Electrical Cardioversion Electrical cardioversion is the delivery of a jolt of electricity to restore a normal rhythm to the heart. A rhythm that is too fast or is not regular keeps the heart from pumping well. In this procedure, sticky patches or metal paddles are placed on the chest to deliver electricity to the heart from a device. This procedure may be done in an emergency if:  There is low or no blood pressure as a result of the heart rhythm.  Normal rhythm must be restored as fast as possible to protect the brain and heart from further damage.  It may save a life. This may also be a scheduled procedure for irregular or fast heart rhythms that are not immediately life-threatening. Tell a health care provider about:  Any allergies you have.  All medicines you are taking, including vitamins, herbs, eye drops, creams, and over-the-counter medicines.  Any problems you or family members have had with anesthetic medicines.  Any blood disorders you have.  Any surgeries you have had.  Any medical conditions you have.  Whether you are pregnant or may be pregnant. What are the risks? Generally, this is a safe procedure. However, problems may occur, including:  Allergic reactions to medicines.  A blood clot that breaks free and travels to other parts of your body.  The possible return of an abnormal heart rhythm within hours or days after the procedure.  Your heart stopping (cardiac arrest). This is rare. What happens before the procedure? Medicines  Your health care provider may have you start taking: ? Blood-thinning medicines (anticoagulants) so your blood does not clot as easily. ? Medicines to help stabilize your heart rate and rhythm.  Ask your health care provider about: ? Changing or stopping your regular medicines. This is especially important if you are taking diabetes medicines or blood thinners. ? Taking medicines such as aspirin and ibuprofen. These medicines can  thin your blood. Do not take these medicines unless your health care provider tells you to take them. ? Taking over-the-counter medicines, vitamins, herbs, and supplements. General instructions  Follow instructions from your health care provider about eating or drinking restrictions.  Plan to have someone take you home from the hospital or clinic.  If you will be going home right after the procedure, plan to have someone with you for 24 hours.  Ask your health care provider what steps will be taken to help prevent infection. These may include washing your skin with a germ-killing soap. What happens during the procedure?   An IV will be inserted into one of your veins.  Sticky patches (electrodes) or metal paddles may be placed on your chest.  You will be given a medicine to help you relax (sedative).  An electrical shock will be delivered. The procedure may vary among health care providers and hospitals. What can I expect after the procedure?  Your blood pressure, heart rate, breathing rate, and blood oxygen level will be monitored until you leave the hospital or clinic.  Your heart rhythm will be watched to make sure it does not change.  You may have some redness on the skin where the shocks were given. Follow these instructions at home:  Do not drive for 24 hours if you were given a sedative during your procedure.  Take over-the-counter and prescription medicines only as told by your health care provider.  Ask your health care provider how to check your pulse. Check it often.  Rest for 48 hours after the procedure or   as told by your health care provider.  Avoid or limit your caffeine use as told by your health care provider.  Keep all follow-up visits as told by your health care provider. This is important. Contact a health care provider if:  You feel like your heart is beating too quickly or your pulse is not regular.  You have a serious muscle cramp that does not go  away. Get help right away if:  You have discomfort in your chest.  You are dizzy or you feel faint.  You have trouble breathing or you are short of breath.  Your speech is slurred.  You have trouble moving an arm or leg on one side of your body.  Your fingers or toes turn cold or blue. Summary  Electrical cardioversion is the delivery of a jolt of electricity to restore a normal rhythm to the heart.  This procedure may be done right away in an emergency or may be a scheduled procedure if the condition is not an emergency.  Generally, this is a safe procedure.  After the procedure, check your pulse often as told by your health care provider. This information is not intended to replace advice given to you by your health care provider. Make sure you discuss any questions you have with your health care provider. Document Revised: 10/24/2018 Document Reviewed: 10/24/2018 Elsevier Patient Education  2020 Elsevier Inc.  

## 2020-03-13 NOTE — Interval H&P Note (Signed)
History and Physical Interval Note:  03/13/2020 9:03 AM  [name]  has presented today for surgery, with the diagnosis of AFIB.  The various methods of treatment have been discussed with the patient and family. After consideration of risks, benefits and other options for treatment, the patient has consented to  Procedure(s): CARDIOVERSION (N/A) as a surgical intervention.  The patient's history has been reviewed, patient examined, no change in status, stable for surgery.  I have reviewed the patient's chart and labs.  Questions were answered to the patient's satisfaction.     [name]

## 2020-03-13 NOTE — Anesthesia Postprocedure Evaluation (Signed)
Anesthesia Post Note  Patient: [name] [name]  Procedure(s) Performed: CARDIOVERSION (N/A )     Patient location during evaluation: Endoscopy Anesthesia Type: General Level of consciousness: awake Pain management: pain level controlled Vital Signs Assessment: post-procedure vital signs reviewed and stable Respiratory status: spontaneous breathing Cardiovascular status: stable Postop Assessment: no apparent nausea or vomiting Anesthetic complications: no   No complications documented.  Last Vitals:  Vitals:   03/13/20 0925 03/13/20 0935  BP: 110/75 114/73  Pulse: 67 65  Resp: (!) 23 16  Temp: (!) 36.4 C   SpO2: 97% 100%    Last Pain:  Vitals:   03/13/20 0935  TempSrc:   PainSc: 0-No pain                 Imanni Burdine

## 2020-03-20 ENCOUNTER — Ambulatory Visit (HOSPITAL_COMMUNITY)
Admission: RE | Admit: 2020-03-20 | Discharge: 2020-03-20 | Disposition: A | Discharge status: Home / Self Care | Payer: Medicare PPO | Source: Ambulatory Visit | Attending: Nurse Practitioner | Admitting: Nurse Practitioner

## 2020-03-20 ENCOUNTER — Ambulatory Visit (HOSPITAL_COMMUNITY): Payer: Medicare PPO | Admitting: Nurse Practitioner

## 2020-03-20 ENCOUNTER — Encounter (HOSPITAL_COMMUNITY): Payer: Self-pay | Admitting: Nurse Practitioner

## 2020-03-20 ENCOUNTER — Other Ambulatory Visit: Payer: Self-pay

## 2020-03-20 VITALS — BP 128/80 | HR 61 | Ht 73.5 in | Wt 174.0 lb

## 2020-03-20 DIAGNOSIS — G4733 Obstructive sleep apnea (adult) (pediatric): Secondary | ICD-10-CM | POA: Insufficient documentation

## 2020-03-20 DIAGNOSIS — Z79899 Other long term (current) drug therapy: Secondary | ICD-10-CM | POA: Diagnosis not present

## 2020-03-20 DIAGNOSIS — I77819 Aortic ectasia, unspecified site: Secondary | ICD-10-CM | POA: Insufficient documentation

## 2020-03-20 DIAGNOSIS — I2510 Atherosclerotic heart disease of native coronary artery without angina pectoris: Secondary | ICD-10-CM | POA: Insufficient documentation

## 2020-03-20 DIAGNOSIS — I440 Atrioventricular block, first degree: Secondary | ICD-10-CM | POA: Insufficient documentation

## 2020-03-20 DIAGNOSIS — Z7901 Long term (current) use of anticoagulants: Secondary | ICD-10-CM | POA: Insufficient documentation

## 2020-03-20 DIAGNOSIS — I4811 Longstanding persistent atrial fibrillation: Secondary | ICD-10-CM | POA: Diagnosis not present

## 2020-03-20 DIAGNOSIS — I4819 Other persistent atrial fibrillation: Secondary | ICD-10-CM

## 2020-03-20 DIAGNOSIS — Z87891 Personal history of nicotine dependence: Secondary | ICD-10-CM | POA: Insufficient documentation

## 2020-03-20 DIAGNOSIS — I10 Essential (primary) hypertension: Secondary | ICD-10-CM | POA: Insufficient documentation

## 2020-03-20 DIAGNOSIS — D6869 Other thrombophilia: Secondary | ICD-10-CM | POA: Diagnosis not present

## 2020-03-20 DIAGNOSIS — N403 Nodular prostate with lower urinary tract symptoms: Secondary | ICD-10-CM | POA: Diagnosis not present

## 2020-03-20 NOTE — Progress Notes (Signed)
Primary Care Physician: [name], MD Referring Physician: Dr. Macon Large is a 74 y.o. male with a h/o very complex afib history requiring 80 + cardioversions, ablations and tikosyn, over many years of having afib. Patient has done well from an afib standpoint until this past weekend 11/17/19 when he noticed much more fatigue during exercise. The next morning he checked his HR which was elevated. He is in rapid afib today. There were no specific triggers that he could identify.   F/u in afib clinic, 8/25. He is here to f/u cardioversion, but converted himself the am of cardioversion. 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 EKG shows SR with first degree AV block at 61 bpm.   F/u afib clinic, 01/18/20. He is now back from [place], he had a successful play and felt great. He remains in SR. He did not have any afib in the [place] area. His echo showed mod aortic root dilation and he is pending a CT of the chest on Monday to further evaluate.   F/u in afib clinc, 03/07/20.  for return of afib several days ago. I increased his BB as he had RVR. His BP is soft today at 06/74. Ekg shows afib at 124 bpm. No known trigger. No missed anticoagulation  and has had vaccines.   Returns  to clinic 03/20/20 s/p successful cardioversion. He remains in SR and feels great.   Today, he denies symptoms of chest pain, shortness of breath, orthopnea, PND, lower extremity edema, dizziness, presyncope, syncope, or neurologic sequela.  The patient is tolerating medications without difficulties and is otherwise without complaint today.   Past Medical History:  Diagnosis Date  . Cardiomyopathy ([place])    a. 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 b. also hx of tachycardia mediated CM, now resolved  . Complication of anesthesia    Pt has anxiety issues and thinks he is going to die.  . Depression   . Echocardiogram abnormal    EF60%, mild  LVH, PA pk pressure 35, pericardial effusion mild increase from 2012   . H/O cardiac catheterization    no significant CAD by cath 5/12  . Hyperlipidemia   . Hypertension   . Limb pain 06/20/2007   LE venous doppler - no evidence of thrombus or thrombophlebitis  . Liver mass    a. CT 03/2013: Small probable cysts in the liver and small hyperenhancing liver mass.  . OSA (obstructive sleep apnea)    AHI avg 5/hr  . Paroxysmal atrial fibrillation ([facility])    a. s/p afib ablations 2002 and 2009 by Dr Rolland Porter. b. s/p DCCV 02/2013. c. s/p DCCV 01/19/2014  . Pericardial effusion    a. Since 2007. b. s/p pericardial window 04/2013.  Marland Kitchen PVC (premature ventricular contraction) 12/18/2009   cardionet monitor 21 days - some PVCs, ventricular bigeminy  . Thoracic aortic aneurysm ([place])    a. Borderline enlarged by CT 12/14 at 4.0cm.   Past Surgical History:  Procedure Laterality Date  . CARDIAC CATHETERIZATION  08/25/10   NO SIGNIFICANT CAD  . CARDIAC ELECTROPHYSIOLOGY MAPPING AND ABLATION  2001,2009   Dr. Tally Due  . CARDIOVASCULAR STRESS TEST  09/08/2005   R/S MV - EF 61%; Exercises capacity 12 Mets; essentially normal perfusion myocardial scan    . CARDIOVERSION  10/18/2007   successful  . CARDIOVERSION N/A 02/08/2013   Procedure: CARDIOVERSION;  Surgeon: Troy Sine, MD;  Location: [place];  Service: Cardiovascular;  Laterality: N/A;  . CARDIOVERSION N/A 08/17/2013   Procedure: CARDIOVERSION;  Surgeon: Sanda Klein, MD;  Location: [place];  Service: Cardiovascular;  Laterality: N/A;  . CARDIOVERSION N/A 12/28/2013   Procedure: CARDIOVERSION;  Surgeon: Fay Records, MD;  Location: [facility] [facility];  Service: Cardiovascular;  Laterality: N/A;  . CARDIOVERSION N/A 01/19/2014   Procedure: CARDIOVERSION;  Surgeon: Sanda Klein, MD;  Location: [facility] ENDOSCOPY;  Service: Cardiovascular;  Laterality: N/A;  . CARDIOVERSION N/A 03/11/2014   Procedure: CARDIOVERSION;  Surgeon: Evans Lance, MD;   Location: [place];  Service: Cardiovascular;  Laterality: N/A;  . CARDIOVERSION N/A 08/23/2014   Procedure: CARDIOVERSION;  Surgeon: Lelon Perla, MD;  Location: [facility] ENDOSCOPY;  Service: Cardiovascular;  Laterality: N/A;  . CARDIOVERSION N/A 10/04/2014   Procedure: CARDIOVERSION;  Surgeon: Sueanne Margarita, MD;  Location: [facility] ENDOSCOPY;  Service: Cardiovascular;  Laterality: N/A;  . CARDIOVERSION N/A 05/22/2015   Procedure: CARDIOVERSION;  Surgeon: Thayer Headings, MD;  Location: [place];  Service: Cardiovascular;  Laterality: N/A;  . CARDIOVERSION N/A 10/08/2017   Procedure: CARDIOVERSION;  Surgeon: Pixie Casino, MD;  Location: [facility] ENDOSCOPY;  Service: Cardiovascular;  Laterality: N/A;  . CARDIOVERSION N/A 03/13/2020   Procedure: CARDIOVERSION;  Surgeon: [name], MD;  Location: [place];  Service: Cardiovascular;  Laterality: N/A;  . CARPAL TUNNEL RELEASE Left April 2015  . ELBOW / UPPER ARM FOREIGN BODY REMOVAL    . ELECTROPHYSIOLOGIC STUDY N/A 09/25/2014   Atrial fibrillation ablation performed by Dr Rayann Heman  . FLEXIBLE SIGMOIDOSCOPY N/A 08/28/2015   Procedure: FLEXIBLE SIGMOIDOSCOPY;  Surgeon: [name], MD;  Location: [facility] ENDOSCOPY;  Service: Endoscopy;  Laterality: N/A;  . FRACTURE SURGERY Left    Left wrist  . HOT HEMOSTASIS N/A 08/28/2015   Procedure: HOT HEMOSTASIS (ARGON PLASMA COAGULATION/BICAP);  Surgeon: [name], MD;  Location: [facility]OS[facility];  Service: Endoscopy;  Laterality: N/A;  . LEFT AND RIGHT HEART CATHETERIZATION WITH CORONARY ANGIOGRAM N/A 03/17/2013   Procedure: LEFT AND RIGHT HEART CATHETERIZATION WITH CORONARY ANGIOGRAM;  Surgeon: Troy Sine, MD;  Location: [facility] CATH LAB;  Service: Cardiovascular;  Laterality: N/A;  . osteosynthesis Left 09/30/2012   with small plate in the ulna  . SUBXYPHOID PERICARDIAL WINDOW N/A 04/25/2013   Procedure: SUBXYPHOID PERICARDIAL WINDOW;  Surgeon: Gaye Pollack, MD;  Location: [facility] OR;  Service: Thoracic;   Laterality: N/A;  . TEE WITHOUT CARDIOVERSION N/A 09/24/2014   Procedure: TRANSESOPHAGEAL ECHOCARDIOGRAM (TEE);  Surgeon: Dorothy Spark, MD;  Location: [facility] ENDOSCOPY;  Service: Cardiovascular;  Laterality: N/A;  . TRANSESOPHAGEAL ECHOCARDIOGRAM  08/26/2010   see Notes tab    Current Outpatient Medications  Medication Sig Dispense Refill  . atorvastatin (LIPITOR) 40 MG tablet TAKE 1 TABLET BY MOUTH EVERY DAY IN THE EVENING (Patient taking differently: Take 40 mg by mouth at bedtime.) 90 tablet 3  . b complex vitamins capsule Take 1 capsule by mouth daily.    Marland Kitchen CALCIUM-MAGNESIUM-ZINC PO Take 3 tablets by mouth at bedtime.     . Cholecalciferol (VITAMIN D) 2000 UNITS CAPS Take 2,000 Units by mouth daily.    Marland Kitchen diltiazem (CARDIZEM CD) 180 MG 24 hr capsule TAKE 1 CAPSULE BY MOUTH EVERY DAY (Patient taking differently: Take 180 mg by mouth at bedtime.) 90 capsule 0  . dofetilide (TIKOSYN) 500 MCG capsule TAKE ONE CAPSULE BY MOUTH TWICE A DAY (Patient taking differently: Take 500 mcg by mouth 2 (  two) times daily.) 180 capsule 1  . finasteride (PROPECIA) 1 MG tablet Take 1 mg by mouth daily.    . furosemide (LASIX) 40 MG tablet Take 0.5 tablets (20 mg total) by mouth daily. 45 tablet 3  . metoprolol succinate (TOPROL-XL) 25 MG 24 hr tablet Take 0.5 tablets (12.5 mg total) by mouth in the morning and at bedtime. Use for Afib (Patient taking differently: Take 12.5-25 mg by mouth See admin instructions. Take 25 mg in the morning and 12.5 mg in the evening) 90 tablet 3  . potassium chloride SA (KLOR-CON M20) 20 MEQ tablet TAKE ONE (1) TABLET (20 MEQ) BY MOUTH DAILY. (Patient taking differently: Take 20 mEq by mouth daily.) 90 tablet 1  . Probiotic Product (PROBIOTIC DAILY PO) Take 1 capsule by mouth at bedtime.     . Saw Palmetto 450 MG CAPS Take 900 mg by mouth in the morning and at bedtime.     Alveda Reasons 20 MG TABS tablet TAKE 1 TABLET (20 MG TOTAL) BY MOUTH DAILY WITH SUPPER. (Patient taking differently:  Take 20 mg by mouth at bedtime.) 90 tablet 1   No current facility-administered medications for this encounter.    Allergies  Allergen Reactions  . Tetracyclines & Related Hives    Social History   Socioeconomic History  . Marital status: Single    Spouse name: Not on file  . Number of children: 0  . Years of education: Not on file  . Highest education level: Not on file  Occupational History    Employer: [employer]  Tobacco Use  . Smoking status: Former Smoker    Packs/day: 1.00    Years: 10.00    Pack years: 10.00    Quit date: 02/06/1989    Years since quitting: 31.1  . Smokeless tobacco: Never Used  Vaping Use  . Vaping Use: Never used  Substance and Sexual Activity  . Alcohol use: No    Comment: 2 bottles of wine per week, pt states has not drank alcohol since christmas  . Drug use: No  . Sexual activity: Not on file  Other Topics Concern  . 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   Social Determinants of Health   Financial Resource Strain: Not on file  Food Insecurity: Not on file  Transportation Needs: Not on file  Physical Activity: Not on file  Stress: Not on file  Social Connections: Not on file  Intimate Partner Violence: Not on file    Family History  Problem Relation Age of Onset  . Coronary artery disease Father   . Heart attack Father   . Stroke Mother        died  . Atrial fibrillation Brother   . Mitral valve prolapse Sister     ROS- All systems are reviewed and negative except as per the HPI above  Physical Exam: Vitals:   03/20/20 1131  BP: 128/80  Pulse: 61  Weight: 78.9 kg  Height: 6' 1.5" (1.867 m)   Wt Readings from Last 3 Encounters:  03/20/20 78.9 kg  03/07/20 79.4 kg  01/18/20 80.4 kg    Labs: Lab Results  Component  Value Date   NA 139 03/07/2020   K 4.1 03/07/2020   CL 101 03/07/2020  CO2 26 03/07/2020   GLUCOSE 96 03/07/2020   BUN 22 03/07/2020   CREATININE 0.98 03/07/2020   CALCIUM 9.7 03/07/2020   MG 2.3 03/07/2020   Lab Results  Component Value Date   INR 0.90 04/24/2013   Lab Results  Component Value Date   CHOL  08/23/2007    188        ATP III CLASSIFICATION:  <200     mg/dL   Desirable  200-239  mg/dL   Borderline High  >=240    mg/dL   High   HDL 47 08/23/2007   LDLCALC (H) 08/23/2007    128        Total Cholesterol/HDL:CHD Risk Coronary Heart Disease Risk Table                     Men   Women  1/2 Average Risk   3.4   3.3   TRIG 63 08/23/2007    GEN- The patient is well appearing, alert and oriented x 3 today.   HEENT-head normocephalic, atraumatic, sclera clear, conjunctiva pink, hearing intact, trachea midline. Lungs- Clear to ausculation bilaterally, normal work of breathing Heart -regular rate and rhythm, tachycardia, no murmurs, rubs or gallops  GI- soft, NT, ND, + BS Extremities- no clubbing, cyanosis, or edema MS- no significant deformity or atrophy Skin- no rash or lesion Psych- euthymic mood, full affect Neuro- strength and sensation are intact   EKG-  Sinus rhythm with first degree AV block, 61 bpm,  pr int 220 ms, qrs int 104  ms, qtc 475 ms   Echo IMPRESSIONS    1. Moderately dilated aortic root (4.6 cm); suggest CTA or MRA to further  assess.  2. Left ventricular ejection fraction, by estimation, is 55 to 60%. The  left ventricle has normal function. The left ventricle has no regional  wall motion abnormalities. There is mild left ventricular hypertrophy of  the basal-septal segment. Left  ventricular diastolic parameters were normal.  3. Right ventricular systolic function is normal. The right ventricular  size is normal. Tricuspid regurgitation signal is inadequate for assessing  PA pressure.  4. Left atrial size was mildly dilated.  5.  The mitral valve is normal in structure. Trivial mitral valve  regurgitation. No evidence of mitral stenosis.  6. The aortic valve is tricuspid. Aortic valve regurgitation is trivial.  Mild aortic valve sclerosis is present, with no evidence of aortic valve  stenosis.  7. Aortic dilatation noted. There is moderate dilatation of the aortic  root, measuring 46 mm.  8. The inferior vena cava is normal in size with greater than 50%  respiratory variability, suggesting right atrial pressure of 3 mmHg.   CT 01/24/20-IMPRESSION: Stable dilatation of the ascending aorta 3.9 cm. Given some motion artifact this is felt to be stable in appearance from the prior exam at which time it measured 4 cm. Recommend annual imaging followup by CTA or MRA. This recommendation follows 2010 ACCF/AHA/AATS/ACR/ASA/SCA/SCAI/SIR/STS/SVM Guidelines for the Diagnosis and Management of Patients with Thoracic Aortic Disease. Circulation. 2010; 121: A630-Z601. Aortic aneurysm NOS (ICD10-I71.9)  Stable parenchymal nodules in the lungs bilaterally. Given their long-term stability since 2016 there are benign in etiology. No further follow-up is necessary.  Hyperdense lesion within the left lobe of the liver is stable. Given its stability from 2014, this is felt to be benign in etiology.  Assessment and Plan: 1. Persistent afib  Patient recently returned to rapid afib. Last DCCV was 10/2017. Had successful cardioversion  03/13/20 Continue Tikosyn 500 mcg BID  Continue diltiazem 180 mg daily Continue  metoprolol succinate  at 12.5  mg in am,  12.5 mg pm Continue Xarelto 20 mg daily for a CHADS2VASC score of at least 4    2. Dilated aortic root  Recent CT shows stable dilation Will need yearly tests   3. OSA Followed by Dr Maxwell Caul  4. HTN Stable   5. CAD No anginal symptoms.   Follow up in the AF clinic in 6 months   Butch Penny C. Kaelon Weekes, [facility] [address] [place], [place] [address] [phone]

## 2020-03-27 DIAGNOSIS — R350 Frequency of micturition: Secondary | ICD-10-CM | POA: Diagnosis not present

## 2020-03-27 DIAGNOSIS — N403 Nodular prostate with lower urinary tract symptoms: Secondary | ICD-10-CM | POA: Diagnosis not present

## 2020-03-27 DIAGNOSIS — L648 Other androgenic alopecia: Secondary | ICD-10-CM | POA: Diagnosis not present

## 2020-03-28 ENCOUNTER — Other Ambulatory Visit: Payer: Self-pay | Admitting: Internal Medicine

## 2020-04-01 ENCOUNTER — Other Ambulatory Visit (HOSPITAL_COMMUNITY): Payer: Self-pay | Admitting: Nurse Practitioner

## 2020-04-09 ENCOUNTER — Encounter (HOSPITAL_COMMUNITY): Payer: Self-pay | Admitting: Physician Assistant

## 2020-04-09 ENCOUNTER — Ambulatory Visit (HOSPITAL_COMMUNITY)
Admission: RE | Admit: 2020-04-09 | Discharge: 2020-04-09 | Disposition: A | Discharge status: Home / Self Care | Payer: Medicare PPO | Source: Ambulatory Visit | Attending: Physician Assistant | Admitting: Physician Assistant

## 2020-04-09 ENCOUNTER — Other Ambulatory Visit: Payer: Self-pay

## 2020-04-09 VITALS — BP 114/60 | HR 60 | Ht 73.5 in | Wt 182.4 lb

## 2020-04-09 DIAGNOSIS — I10 Essential (primary) hypertension: Secondary | ICD-10-CM | POA: Diagnosis not present

## 2020-04-09 DIAGNOSIS — Z87891 Personal history of nicotine dependence: Secondary | ICD-10-CM | POA: Diagnosis not present

## 2020-04-09 DIAGNOSIS — I2510 Atherosclerotic heart disease of native coronary artery without angina pectoris: Secondary | ICD-10-CM | POA: Diagnosis not present

## 2020-04-09 DIAGNOSIS — E785 Hyperlipidemia, unspecified: Secondary | ICD-10-CM | POA: Insufficient documentation

## 2020-04-09 DIAGNOSIS — D6869 Other thrombophilia: Secondary | ICD-10-CM

## 2020-04-09 DIAGNOSIS — Z7901 Long term (current) use of anticoagulants: Secondary | ICD-10-CM | POA: Insufficient documentation

## 2020-04-09 DIAGNOSIS — G4733 Obstructive sleep apnea (adult) (pediatric): Secondary | ICD-10-CM | POA: Insufficient documentation

## 2020-04-09 DIAGNOSIS — I440 Atrioventricular block, first degree: Secondary | ICD-10-CM | POA: Insufficient documentation

## 2020-04-09 DIAGNOSIS — I4819 Other persistent atrial fibrillation: Secondary | ICD-10-CM | POA: Diagnosis not present

## 2020-04-09 DIAGNOSIS — R600 Localized edema: Secondary | ICD-10-CM | POA: Diagnosis not present

## 2020-04-09 NOTE — Progress Notes (Signed)
Primary Care Physician: Clayborn Heron, MD Referring Physician: Dr. Marcheta Grammes is a 75 y.o. male with a h/o very complex afib history requiring 80 + cardioversions, ablations and tikosyn, over many years of having afib. Patient has done well from an afib standpoint until this past weekend 11/17/19 when he noticed much more fatigue during exercise. The next morning he checked his HR which was elevated. He is in rapid afib today. There were no specific triggers that he could identify.   F/u in afib clinic, 8/25. He is here to f/u cardioversion, but converted himself the am of cardioversion. 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 EKG shows SR with first degree AV block at 61 bpm.   F/u afib clinic, 01/18/20. He is now back from [place], he had a successful play and felt great. He remains in SR. He did not have any afib in the [place] area. His echo showed mod aortic root dilation and he is pending a CT of the chest on Monday to further evaluate.   F/u in afib clinc, 03/07/20.  for return of afib several days ago. I increased his BB as he had RVR. His BP is soft today at 06/74. Ekg shows afib at 124 bpm. No known trigger. No missed anticoagulation  and has had vaccines.   Returns  to clinic 03/20/20 s/p successful cardioversion. He remains in SR and feels great.   Follow up in the AF clinic 04/10/19. Patient reports that he felt he was back in afib with an irregular pulse yesterday. He did increase his BB and he is back in SR on presentation today. There were no triggers that he can identify. He has also noted some mild lower extremity edema.  Today, he denies symptoms of chest pain, shortness of breath, orthopnea, PND, lower extremity edema, dizziness, presyncope, syncope, or neurologic sequela.  The patient is tolerating medications without difficulties and is otherwise without complaint today.   Past Medical History:  Diagnosis Date  .  Cardiomyopathy (HCC)    a. 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 b. also hx of tachycardia mediated CM, now resolved  . Complication of anesthesia    Pt has anxiety issues and thinks he is going to die.  . Depression   . Echocardiogram abnormal    EF60%, mild LVH, PA pk pressure 35, pericardial effusion mild increase from 2012   . H/O cardiac catheterization    no significant CAD by cath 5/12  . Hyperlipidemia   . Hypertension   . Limb pain 06/20/2007   LE venous doppler - no evidence of thrombus or thrombophlebitis  . Liver mass    a. CT 03/2013: Small probable cysts in the liver and small hyperenhancing liver mass.  . OSA (obstructive sleep apnea)    AHI avg 5/hr  . Paroxysmal atrial fibrillation (HCC)    a. s/p afib ablations 2002 and 2009 by Dr Delena Serve. b. s/p DCCV 02/2013. c. s/p DCCV 01/19/2014  . Pericardial effusion    a. Since 2007. b. s/p pericardial window 04/2013.  Marland Kitchen PVC (premature ventricular contraction) 12/18/2009   cardionet monitor 21 days - some PVCs, ventricular bigeminy  . Thoracic aortic aneurysm (HCC)    a. Borderline enlarged by CT 12/14 at 4.0cm.   Past Surgical History:  Procedure Laterality Date  . CARDIAC CATHETERIZATION  08/25/10   NO SIGNIFICANT CAD  .  CARDIAC ELECTROPHYSIOLOGY MAPPING AND ABLATION  2001,2009   Dr. Tally Due  . CARDIOVASCULAR STRESS TEST  09/08/2005   R/S MV - EF 61%; Exercises capacity 12 Mets; essentially normal perfusion myocardial scan    . CARDIOVERSION  10/18/2007   successful  . CARDIOVERSION N/A 02/08/2013   Procedure: CARDIOVERSION;  Surgeon: Troy Sine, MD;  Location: [place];  Service: Cardiovascular;  Laterality: N/A;  . CARDIOVERSION N/A 08/17/2013   Procedure: CARDIOVERSION;  Surgeon: Sanda Klein, MD;  Location: [place];  Service: Cardiovascular;  Laterality: N/A;  . CARDIOVERSION N/A 12/28/2013   Procedure: CARDIOVERSION;  Surgeon: Fay Records, MD;  Location: [place];  Service:  Cardiovascular;  Laterality: N/A;  . CARDIOVERSION N/A 01/19/2014   Procedure: CARDIOVERSION;  Surgeon: Sanda Klein, MD;  Location: [facility] ENDOSCOPY;  Service: Cardiovascular;  Laterality: N/A;  . CARDIOVERSION N/A 03/11/2014   Procedure: CARDIOVERSION;  Surgeon: Evans Lance, MD;  Location: [place];  Service: Cardiovascular;  Laterality: N/A;  . CARDIOVERSION N/A 08/23/2014   Procedure: CARDIOVERSION;  Surgeon: Lelon Perla, MD;  Location: [place];  Service: Cardiovascular;  Laterality: N/A;  . CARDIOVERSION N/A 10/04/2014   Procedure: CARDIOVERSION;  Surgeon: Sueanne Margarita, MD;  Location: [facility] ENDOSCOPY;  Service: Cardiovascular;  Laterality: N/A;  . CARDIOVERSION N/A 05/22/2015   Procedure: CARDIOVERSION;  Surgeon: Thayer Headings, MD;  Location: [place];  Service: Cardiovascular;  Laterality: N/A;  . CARDIOVERSION N/A 10/08/2017   Procedure: CARDIOVERSION;  Surgeon: Pixie Casino, MD;  Location: [facility] ENDOSCOPY;  Service: Cardiovascular;  Laterality: N/A;  . CARDIOVERSION N/A 03/13/2020   Procedure: CARDIOVERSION;  Surgeon: [name], MD;  Location: [place];  Service: Cardiovascular;  Laterality: N/A;  . CARPAL TUNNEL RELEASE Left April 2015  . ELBOW / UPPER ARM FOREIGN BODY REMOVAL    . ELECTROPHYSIOLOGIC STUDY N/A 09/25/2014   Atrial fibrillation ablation performed by Dr Rayann Heman  . FLEXIBLE SIGMOIDOSCOPY N/A 08/28/2015   Procedure: FLEXIBLE SIGMOIDOSCOPY;  Surgeon: [name], MD;  Location: [facility] ENDOSCOPY;  Service: Endoscopy;  Laterality: N/A;  . FRACTURE SURGERY Left    Left wrist  . HOT HEMOSTASIS N/A 08/28/2015   Procedure: HOT HEMOSTASIS (ARGON PLASMA COAGULATION/BICAP);  Surgeon: [name], MD;  Location: [facility] ENDOSCOPY;  Service: Endoscopy;  Laterality: N/A;  . LEFT AND RIGHT HEART CATHETERIZATION WITH CORONARY ANGIOGRAM N/A 03/17/2013   Procedure: LEFT AND RIGHT HEART CATHETERIZATION WITH CORONARY ANGIOGRAM;  Surgeon: Troy Sine, MD;  Location:  [facility] CATH LAB;  Service: Cardiovascular;  Laterality: N/A;  . osteosynthesis Left 09/30/2012   with small plate in the ulna  . SUBXYPHOID PERICARDIAL WINDOW N/A 04/25/2013   Procedure: SUBXYPHOID PERICARDIAL WINDOW;  Surgeon: Gaye Pollack, MD;  Location: [facility] OR;  Service: Thoracic;  Laterality: N/A;  . TEE WITHOUT CARDIOVERSION N/A 09/24/2014   Procedure: TRANSESOPHAGEAL ECHOCARDIOGRAM (TEE);  Surgeon: Dorothy Spark, MD;  Location: [facility] ENDOSCOPY;  Service: Cardiovascular;  Laterality: N/A;  . TRANSESOPHAGEAL ECHOCARDIOGRAM  08/26/2010   see Notes tab    Current Outpatient Medications  Medication Sig Dispense Refill  . atorvastatin (LIPITOR) 40 MG tablet TAKE 1 TABLET BY MOUTH EVERY DAY IN THE EVENING 90 tablet 3  . b complex vitamins capsule Take 1 capsule by mouth daily.    Marland Kitchen CALCIUM-MAGNESIUM-ZINC PO Take 3 tablets by mouth at bedtime.     . Cholecalciferol (VITAMIN D) 2000 UNITS CAPS Take 2,000 Units by mouth daily.    Marland Kitchen diltiazem (CARDIZEM CD) 180 MG 24 hr  capsule TAKE 1 CAPSULE BY MOUTH EVERY DAY 90 capsule 3  . dofetilide (TIKOSYN) 500 MCG capsule TAKE ONE CAPSULE BY MOUTH TWICE A DAY 180 capsule 1  . finasteride (PROPECIA) 1 MG tablet Take 1 mg by mouth daily.    . furosemide (LASIX) 40 MG tablet TAKE 1/2 TABLET BY MOUTH DAILY 45 tablet 3  . metoprolol succinate (TOPROL-XL) 25 MG 24 hr tablet Take 0.5 tablets (12.5 mg total) by mouth in the morning and at bedtime. Use for Afib 90 tablet 3  . Milk Thistle 1000 MG CAPS Take by mouth.    . potassium chloride SA (KLOR-CON M20) 20 MEQ tablet TAKE ONE (1) TABLET (20 MEQ) BY MOUTH DAILY. 90 tablet 1  . Probiotic Product (PROBIOTIC DAILY PO) Take 1 capsule by mouth at bedtime.     . Saw Palmetto 450 MG CAPS Take 900 mg by mouth in the morning and at bedtime.     Carlena Hurl 20 MG TABS tablet TAKE 1 TABLET (20 MG TOTAL) BY MOUTH DAILY WITH SUPPER. 90 tablet 1   No current facility-administered medications for this encounter.    Allergies   Allergen Reactions  . Tetracyclines & Related Hives    Social History   Socioeconomic History  . Marital status: Single    Spouse name: Not on file  . Number of children: 0  . Years of education: Not on file  . Highest education level: Not on file  Occupational History    Employer: [employer]  Tobacco Use  . Smoking status: Former Smoker    Packs/day: 1.00    Years: 10.00    Pack years: 10.00    Quit date: 02/06/1989    Years since quitting: 31.1  . Smokeless tobacco: Never Used  Vaping Use  . Vaping Use: Never used  Substance and Sexual Activity  . Alcohol use: No    Comment: 2 bottles of wine per week, pt states has not drank alcohol since christmas  . Drug use: No  . Sexual activity: Not on file  Other Topics Concern  . 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  He is single and has no children.   Social Determinants of Health   Financial Resource Strain: Not on file  Food Insecurity: Not on file  Transportation Needs: Not on file  Physical Activity: Not on file  Stress: Not on file  Social Connections: Not on file  Intimate Partner Violence: Not on file    Family History  Problem Relation Age of Onset  . Coronary artery disease Father   . Heart attack Father   . Stroke Mother        died  . Atrial fibrillation Brother   . Mitral valve prolapse Sister     ROS- All systems are reviewed and negative except as per the HPI above  Physical Exam: Vitals:   04/09/20 1423  BP: 114/60  Pulse: 60  Weight: 82.7 kg  Height: 6' 1.5" (1.867 m)   Wt Readings from Last 3 Encounters:  04/09/20 82.7 kg  03/20/20 78.9 kg  03/07/20 79.4 kg    Labs: Lab Results  Component Value Date   NA 139 03/07/2020   K 4.1 03/07/2020   CL 101 03/07/2020   CO2 26 03/07/2020   GLUCOSE 96 03/07/2020  BUN 22  03/07/2020   CREATININE 0.98 03/07/2020   CALCIUM 9.7 03/07/2020   MG 2.3 03/07/2020   Lab Results  Component Value Date   INR 0.90 04/24/2013   Lab Results  Component Value Date   CHOL  08/23/2007    188        ATP III CLASSIFICATION:  <200     mg/dL   Desirable  200-239  mg/dL   Borderline High  >=240    mg/dL   High   HDL 47 08/23/2007   LDLCALC (H) 08/23/2007    128        Total Cholesterol/HDL:CHD Risk Coronary Heart Disease Risk Table                     Men   Women  1/2 Average Risk   3.4   3.3   TRIG 63 08/23/2007    GEN- The patient is well appearing, alert and oriented x 3 today.   HEENT-head normocephalic, atraumatic, sclera clear, conjunctiva pink, hearing intact, trachea midline. Lungs- Clear to ausculation bilaterally, normal work of breathing Heart- Regular rate and rhythm, no murmurs, rubs or gallops  GI- soft, NT, ND, + BS Extremities- no clubbing, cyanosis, or edema MS- no significant deformity or atrophy Skin- no rash or lesion Psych- euthymic mood, full affect Neuro- strength and sensation are intact   EKG-  SR HR 60, 1st degree AV block, PR 228, QRS 96, QTc 440  Echo IMPRESSIONS    1. Moderately dilated aortic root (4.6 cm); suggest CTA or MRA to further  assess.  2. Left ventricular ejection fraction, by estimation, is 55 to 60%. The  left ventricle has normal function. The left ventricle has no regional  wall motion abnormalities. There is mild left ventricular hypertrophy of  the basal-septal segment. Left  ventricular diastolic parameters were normal.  3. Right ventricular systolic function is normal. The right ventricular  size is normal. Tricuspid regurgitation signal is inadequate for assessing  PA pressure.  4. Left atrial size was mildly dilated.  5. The mitral valve is normal in structure. Trivial mitral valve  regurgitation. No evidence of mitral stenosis.  6. The aortic valve is tricuspid. Aortic valve regurgitation is  trivial.  Mild aortic valve sclerosis is present, with no evidence of aortic valve  stenosis.  7. Aortic dilatation noted. There is moderate dilatation of the aortic  root, measuring 46 mm.  8. The inferior vena cava is normal in size with greater than 50%  respiratory variability, suggesting right atrial pressure of 3 mmHg.   CT 01/24/20-IMPRESSION: Stable dilatation of the ascending aorta 3.9 cm. Given some motion artifact this is felt to be stable in appearance from the prior exam at which time it measured 4 cm. Recommend annual imaging followup by CTA or MRA. This recommendation follows 2010 ACCF/AHA/AATS/ACR/ASA/SCA/SCAI/SIR/STS/SVM Guidelines for the Diagnosis and Management of Patients with Thoracic Aortic Disease. Circulation. 2010; [id number]. Aortic aneurysm NOS (ICD10-I71.9)  Stable parenchymal nodules in the lungs bilaterally. Given their long-term stability since 2016 there are benign in etiology. No further follow-up is necessary.  Hyperdense lesion within the left lobe of the liver is stable. Given its stability from 2014, this is felt to be benign in etiology.   Assessment and Plan: 1. Persistent afib  Had successful cardioversion 03/13/20 Patient back in SR.  Continue Tikosyn 500 mcg BID  Continue diltiazem 180 mg daily Continue  metoprolol succinate  at 12.5  mg in am, 12.5  mg pm Continue Xarelto 20 mg daily for a CHADS2VASC score of at least 4   2. Dilated aortic root  CT shows stable dilation Will need yearly tests   3. OSA Followed by Dr Maxwell Caul  4. HTN Stable, no changes today.  5. CAD No anginal symptoms.   6. Lower extremity edema Increase Lasix to 40 mg daily x3 days then decrease back to 20 mg daily.   Follow up with Dr Rayann Heman and Roderic Palau per recall.    [facility] [address] [place], [place] [address] [phone]

## 2020-05-13 ENCOUNTER — Other Ambulatory Visit: Payer: Self-pay | Admitting: Internal Medicine

## 2020-05-13 NOTE — Telephone Encounter (Signed)
Pt's age 75, wt 82.7 kg, SCr 0.98, CrCl 78.53, last ov w/ CF 04/09/20.

## 2020-05-26 ENCOUNTER — Other Ambulatory Visit: Payer: Self-pay | Admitting: Internal Medicine

## 2020-06-21 DIAGNOSIS — G4731 Primary central sleep apnea: Secondary | ICD-10-CM | POA: Diagnosis not present

## 2020-06-21 DIAGNOSIS — G4733 Obstructive sleep apnea (adult) (pediatric): Secondary | ICD-10-CM | POA: Diagnosis not present

## 2020-06-30 ENCOUNTER — Other Ambulatory Visit: Payer: Self-pay | Admitting: Internal Medicine

## 2020-07-01 NOTE — Telephone Encounter (Signed)
This is a A-Fib clinic pt. Clinic sees pt for Dr. Rayann Heman. Please address

## 2020-07-07 ENCOUNTER — Other Ambulatory Visit: Payer: Self-pay | Admitting: Internal Medicine

## 2020-08-05 ENCOUNTER — Other Ambulatory Visit: Payer: Self-pay | Admitting: *Deleted

## 2020-08-05 MED ORDER — POTASSIUM CHLORIDE CRYS ER 20 MEQ PO TBCR
EXTENDED_RELEASE_TABLET | ORAL | 2 refills | Status: DC
Start: 2020-08-05 — End: 2021-03-12

## 2020-08-13 ENCOUNTER — Other Ambulatory Visit (HOSPITAL_COMMUNITY): Payer: Self-pay | Admitting: Physician Assistant

## 2020-09-04 DIAGNOSIS — H43811 Vitreous degeneration, right eye: Secondary | ICD-10-CM | POA: Diagnosis not present

## 2020-09-04 DIAGNOSIS — Z961 Presence of intraocular lens: Secondary | ICD-10-CM | POA: Diagnosis not present

## 2020-09-04 DIAGNOSIS — H18603 Keratoconus, unspecified, bilateral: Secondary | ICD-10-CM | POA: Diagnosis not present

## 2020-09-04 DIAGNOSIS — H35033 Hypertensive retinopathy, bilateral: Secondary | ICD-10-CM | POA: Diagnosis not present

## 2020-09-11 MED ORDER — TADALAFIL 10 MG PO TABS: 10.0000 mg | ORAL_TABLET | Freq: Every day | ORAL | 2 refills | Status: DC | PRN

## 2020-10-01 ENCOUNTER — Encounter (HOSPITAL_COMMUNITY): Payer: Self-pay | Admitting: Nurse Practitioner

## 2020-10-01 ENCOUNTER — Ambulatory Visit (HOSPITAL_COMMUNITY)
Admission: RE | Admit: 2020-10-01 | Discharge: 2020-10-01 | Disposition: A | Discharge status: Home / Self Care | Payer: Medicare PPO | Source: Ambulatory Visit | Attending: Nurse Practitioner | Admitting: Nurse Practitioner

## 2020-10-01 ENCOUNTER — Other Ambulatory Visit: Payer: Self-pay

## 2020-10-01 VITALS — BP 118/72 | HR 73 | Ht 73.5 in | Wt 172.8 lb

## 2020-10-01 DIAGNOSIS — Z8249 Family history of ischemic heart disease and other diseases of the circulatory system: Secondary | ICD-10-CM | POA: Insufficient documentation

## 2020-10-01 DIAGNOSIS — G4733 Obstructive sleep apnea (adult) (pediatric): Secondary | ICD-10-CM | POA: Insufficient documentation

## 2020-10-01 DIAGNOSIS — I4819 Other persistent atrial fibrillation: Secondary | ICD-10-CM | POA: Diagnosis not present

## 2020-10-01 DIAGNOSIS — Z888 Allergy status to other drugs, medicaments and biological substances status: Secondary | ICD-10-CM | POA: Insufficient documentation

## 2020-10-01 DIAGNOSIS — Z79899 Other long term (current) drug therapy: Secondary | ICD-10-CM | POA: Insufficient documentation

## 2020-10-01 DIAGNOSIS — I2510 Atherosclerotic heart disease of native coronary artery without angina pectoris: Secondary | ICD-10-CM | POA: Diagnosis not present

## 2020-10-01 DIAGNOSIS — D6869 Other thrombophilia: Secondary | ICD-10-CM | POA: Diagnosis not present

## 2020-10-01 DIAGNOSIS — I77819 Aortic ectasia, unspecified site: Secondary | ICD-10-CM | POA: Diagnosis not present

## 2020-10-01 DIAGNOSIS — I10 Essential (primary) hypertension: Secondary | ICD-10-CM | POA: Insufficient documentation

## 2020-10-01 DIAGNOSIS — Z87891 Personal history of nicotine dependence: Secondary | ICD-10-CM | POA: Diagnosis not present

## 2020-10-01 DIAGNOSIS — Z7901 Long term (current) use of anticoagulants: Secondary | ICD-10-CM | POA: Insufficient documentation

## 2020-10-01 LAB — BASIC METABOLIC PANEL
Anion gap: 8 (ref 5–15)
BUN: 17 mg/dL (ref 8–23)
CO2: 27 mmol/L (ref 22–32)
Calcium: 9.1 mg/dL (ref 8.9–10.3)
Chloride: 102 mmol/L (ref 98–111)
Creatinine, Ser: 0.83 mg/dL (ref 0.61–1.24)
GFR, Estimated: >60 mL/min (ref >60)
Glucose, Bld: 100 mg/dL — ABNORMAL HIGH (ref 70–99)
Potassium: 4.1 mmol/L (ref 3.5–5.1)
Sodium: 137 mmol/L (ref 135–145)

## 2020-10-01 LAB — MAGNESIUM: Magnesium: 2.3 mg/dL (ref 1.7–2.4)

## 2020-10-01 NOTE — Progress Notes (Signed)
Primary Care Physician: [name], MD Referring Physician: Dr. Macon Large is a 75 y.o. male with a h/o very complex afib history requiring 80 + cardioversions, ablations and tikosyn, over many years of having afib. Patient has done well from an afib standpoint until this past weekend 11/17/19 when he noticed much more fatigue during exercise. The next morning he checked his HR which was elevated. He is in rapid afib today. There were no specific triggers that he could identify.   F/u in afib clinic, 8/25. He is here to f/u cardioversion, but converted himself the am of cardioversion. 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 EKG shows SR with first degree AV block at 61 bpm.   F/u afib clinic, 01/18/20. He is now back from [place], he had a successful play and felt great. He remains in SR. He did not have any afib in the [place] area. His echo showed mod aortic root dilation and he is pending a CT of the chest on Monday to further evaluate.   F/u in afib clinc, 03/07/20.  for return of afib several days ago. I increased his BB as he had RVR. His BP is soft today at 06/74. Ekg shows afib at 124 bpm. No known trigger. No missed anticoagulation  and has had vaccines.   Returns  to clinic 03/20/20 s/p successful cardioversion. He remains in SR and feels great.   F/u in afib clinic, 10/01/20. No afib to report he feels well.   Today, he denies symptoms of chest pain, shortness of breath, orthopnea, PND, lower extremity edema, dizziness, presyncope, syncope, or neurologic sequela.  The patient is tolerating medications without difficulties and is otherwise without complaint today.   Past Medical History:  Diagnosis Date   Cardiomyopathy ([place])    a. 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 b. also hx of tachycardia mediated CM, now resolved   Complication of anesthesia    Pt has anxiety issues and thinks he is going to  die.   Depression    Echocardiogram abnormal    EF60%, mild LVH, PA pk pressure 35, pericardial effusion mild increase from 2012    H/O cardiac catheterization    no significant CAD by cath 5/12   Hyperlipidemia    Hypertension    Limb pain 06/20/2007   LE venous doppler - no evidence of thrombus or thrombophlebitis   Liver mass    a. CT 03/2013: Small probable cysts in the liver and small hyperenhancing liver mass.   OSA (obstructive sleep apnea)    AHI avg 5/hr   Paroxysmal atrial fibrillation ([place])    a. s/p afib ablations 2002 and 2009 by Dr Rolland Porter. b. s/p DCCV 02/2013. c. s/p DCCV 01/19/2014   Pericardial effusion    a. Since 2007. b. s/p pericardial window 04/2013.   PVC (premature ventricular contraction) 12/18/2009   cardionet monitor 21 days - some PVCs, ventricular bigeminy   Thoracic aortic aneurysm ([place])    a. Borderline enlarged by CT 12/14 at 4.0cm.   Past Surgical History:  Procedure Laterality Date   CARDIAC CATHETERIZATION  08/25/10   NO SIGNIFICANT CAD   CARDIAC ELECTROPHYSIOLOGY MAPPING AND ABLATION  2001,2009   Dr. Tally Due   CARDIOVASCULAR STRESS TEST  09/08/2005   R/S MV - EF 61%; Exercises capacity 12 Mets; essentially normal perfusion myocardial scan     CARDIOVERSION  10/18/2007   successful   CARDIOVERSION N/A 02/08/2013   Procedure: CARDIOVERSION;  Surgeon: Troy Sine, MD;  Location: [place];  Service: Cardiovascular;  Laterality: N/A;   CARDIOVERSION N/A 08/17/2013   Procedure: CARDIOVERSION;  Surgeon: Sanda Klein, MD;  Location: [place];  Service: Cardiovascular;  Laterality: N/A;   CARDIOVERSION N/A 12/28/2013   Procedure: CARDIOVERSION;  Surgeon: Fay Records, MD;  Location: [place];  Service: Cardiovascular;  Laterality: N/A;   CARDIOVERSION N/A 01/19/2014   Procedure: CARDIOVERSION;  Surgeon: Sanda Klein, MD;  Location: [facility] ENDOSCOPY;  Service: Cardiovascular;  Laterality: N/A;   CARDIOVERSION N/A 03/11/2014   Procedure:  CARDIOVERSION;  Surgeon: Evans Lance, MD;  Location: [place];  Service: Cardiovascular;  Laterality: N/A;   CARDIOVERSION N/A 08/23/2014   Procedure: CARDIOVERSION;  Surgeon: Lelon Perla, MD;  Location: [place];  Service: Cardiovascular;  Laterality: N/A;   CARDIOVERSION N/A 10/04/2014   Procedure: CARDIOVERSION;  Surgeon: Sueanne Margarita, MD;  Location: [place];  Service: Cardiovascular;  Laterality: N/A;   CARDIOVERSION N/A 05/22/2015   Procedure: CARDIOVERSION;  Surgeon: Thayer Headings, MD;  Location: [place];  Service: Cardiovascular;  Laterality: N/A;   CARDIOVERSION N/A 10/08/2017   Procedure: CARDIOVERSION;  Surgeon: Pixie Casino, MD;  Location: [place];  Service: Cardiovascular;  Laterality: N/A;   CARDIOVERSION N/A 03/13/2020   Procedure: CARDIOVERSION;  Surgeon: [name], MD;  Location: [place];  Service: Cardiovascular;  Laterality: N/A;   CARPAL TUNNEL RELEASE Left April 2015   ELBOW / UPPER ARM FOREIGN BODY REMOVAL     ELECTROPHYSIOLOGIC STUDY N/A 09/25/2014   Atrial fibrillation ablation performed by Dr Marinda Elk SIGMOIDOSCOPY N/A 08/28/2015   Procedure: FLEXIBLE SIGMOIDOSCOPY;  Surgeon: [name], MD;  Location: [facility] ENDOSCOPY;  Service: Endoscopy;  Laterality: N/A;   FRACTURE SURGERY Left    Left wrist   HOT HEMOSTASIS N/A 08/28/2015   Procedure: HOT HEMOSTASIS (ARGON PLASMA COAGULATION/BICAP);  Surgeon: [name], MD;  Location: [facility];  Service: Endoscopy;  Laterality: N/A;   LEFT AND RIGHT HEART CATHETERIZATION WITH CORONARY ANGIOGRAM N/A 03/17/2013   Procedure: LEFT AND RIGHT HEART CATHETERIZATION WITH CORONARY ANGIOGRAM;  Surgeon: Troy Sine, MD;  Location: [facility] CATH LAB;  Service: Cardiovascular;  Laterality: N/A;   osteosynthesis Left 09/30/2012   with small plate in the ulna   SUBXYPHOID PERICARDIAL WINDOW N/A 04/25/2013   Procedure: SUBXYPHOID PERICARDIAL WINDOW;  Surgeon: Gaye Pollack, MD;  Location:  [facility] OR;  Service: Thoracic;  Laterality: N/A;   TEE WITHOUT CARDIOVERSION N/A 09/24/2014   Procedure: TRANSESOPHAGEAL ECHOCARDIOGRAM (TEE);  Surgeon: Dorothy Spark, MD;  Location: [facility] ENDOSCOPY;  Service: Cardiovascular;  Laterality: N/A;   TRANSESOPHAGEAL ECHOCARDIOGRAM  08/26/2010   see Notes tab    Current Outpatient Medications  Medication Sig Dispense Refill   atorvastatin (LIPITOR) 40 MG tablet Take 1 tablet (40 mg total) by mouth every evening. 90 tablet 3   b complex vitamins capsule Take 1 capsule by mouth daily.     CALCIUM-MAGNESIUM-ZINC PO Take 3 tablets by mouth at bedtime.      Cholecalciferol (VITAMIN D) 2000 UNITS CAPS Take 2,000 Units by mouth daily.     diltiazem (CARDIZEM CD) 180 MG 24 hr capsule TAKE 1 CAPSULE BY MOUTH EVERY DAY 90 capsule 3   dofetilide (TIKOSYN) 500 MCG capsule TAKE ONE CAPSULE BY MOUTH TWICE A DAY 180 capsule 1   finasteride (PROPECIA) 1 MG tablet Take 1 mg by mouth  daily.     furosemide (LASIX) 40 MG tablet TAKE 1/2 TABLET BY MOUTH DAILY 45 tablet 3   metoprolol succinate (TOPROL-XL) 25 MG 24 hr tablet TAKE 0.5 TABLETS (12.5 MG TOTAL) BY MOUTH IN THE MORNING AND AT BEDTIME. USE FOR AFIB 90 tablet 3   potassium chloride SA (KLOR-CON M20) 20 MEQ tablet TAKE ONE (1) TABLET (20 MEQ) BY MOUTH DAILY. 90 tablet 2   Psyllium (DAILY FIBER) 400 MG CAPS Take by mouth.     Saw Palmetto 450 MG CAPS Take 900 mg by mouth in the morning and at bedtime.      tadalafil (CIALIS) 10 MG tablet Take 1 tablet (10 mg total) by mouth daily as needed for erectile dysfunction. 10 tablet 2   XARELTO 20 MG TABS tablet TAKE 1 TABLET (20 MG TOTAL) BY MOUTH DAILY WITH SUPPER. 90 tablet 1   No current facility-administered medications for this encounter.    Allergies  Allergen Reactions   Tetracyclines & Related Hives    Social History   Socioeconomic History   Marital status: Single    Spouse name: Not on file   Number of children: 0   Years of education: Not on file    Highest education level: Not on file  Occupational History    Employer: [employer]  Tobacco Use   Smoking status: Former    Packs/day: 1.00    Years: 10.00    Pack years: 10.00    Types: Cigarettes    Quit date: 02/06/1989    Years since quitting: 31.6   Smokeless tobacco: Never  Vaping Use   Vaping Use: Never used  Substance and Sexual Activity   Alcohol use: No    Comment: 2 bottles of wine per week, pt states has not drank alcohol since christmas   Drug use: No   Sexual activity: Not on file  Other Topics Concern   Not on file  Social History Narrative   The patient has a Conservator, 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   Social Determinants of Health   Financial Resource Strain: Not on file  Food Insecurity: Not on file  Transportation Needs: Not on file  Physical Activity: Not on file  Stress: Not on file  Social Connections: Not on file  Intimate Partner Violence: Not on file    Family History  Problem Relation Age of Onset   Coronary artery disease Father    Heart attack Father    Stroke Mother        died   Atrial fibrillation Brother    Mitral valve prolapse Sister     ROS- All systems are reviewed and negative except as per the HPI above  Physical Exam: Vitals:   10/01/20 1519  BP: 118/72  Pulse: 73  Weight: 78.4 kg  Height: 6' 1.5" (1.867 m)   Wt Readings from Last 3 Encounters:  10/01/20 78.4 kg  04/09/20 82.7 kg  03/20/20 78.9 kg    Labs: Lab Results  Component Value Date   NA 139 03/07/2020   K 4.1 03/07/2020   CL 101 03/07/2020   CO2 26 03/07/2020   GLUCOSE 96 03/07/2020   BUN 22 03/07/2020   CREATININE 0.98 03/07/2020   CALCIUM 9.7 03/07/2020   MG 2.3 03/07/2020   Lab Results  Component Value Date   INR 0.90  04/24/2013   Lab Results  Component Value Date   CHOL  08/23/2007    188         ATP III CLASSIFICATION:  <200     mg/dL   Desirable  200-239  mg/dL   Borderline High  >=240    mg/dL   High   HDL 47 08/23/2007   LDLCALC (H) 08/23/2007    128        Total Cholesterol/HDL:CHD Risk Coronary Heart Disease Risk Table                     Men   Women  1/2 Average Risk   3.4   3.3   TRIG 63 08/23/2007    GEN- The patient is well appearing, alert and oriented x 3 today.   HEENT-head normocephalic, atraumatic, sclera clear, conjunctiva pink, hearing intact, trachea midline. Lungs- Clear to ausculation bilaterally, normal work of breathing Heart -regular rate and rhythm, tachycardia, no murmurs, rubs or gallops  GI- soft, NT, ND, + BS Extremities- no clubbing, cyanosis, or edema MS- no significant deformity or atrophy Skin- no rash or lesion Psych- euthymic mood, full affect Neuro- strength and sensation are intact   EKG-  Sinus rhythm with first degree AV block, 73 bpm,  pr int 214 ms, qrs int 90 ms, qtc 436 ms   Echo IMPRESSIONS     1. Moderately dilated aortic root (4.6 cm); suggest CTA or MRA to further  assess.   2. Left ventricular ejection fraction, by estimation, is 55 to 60%. The  left ventricle has normal function. The left ventricle has no regional  wall motion abnormalities. There is mild left ventricular hypertrophy of  the basal-septal segment. Left  ventricular diastolic parameters were normal.   3. Right ventricular systolic function is normal. The right ventricular  size is normal. Tricuspid regurgitation signal is inadequate for assessing  PA pressure.   4. Left atrial size was mildly dilated.   5. The mitral valve is normal in structure. Trivial mitral valve  regurgitation. No evidence of mitral stenosis.   6. The aortic valve is tricuspid. Aortic valve regurgitation is trivial.  Mild aortic valve sclerosis is present, with no evidence of aortic valve  stenosis.   7. Aortic dilatation noted. There is moderate dilatation of the aortic   root, measuring 46 mm.   8. The inferior vena cava is normal in size with greater than 50%  respiratory variability, suggesting right atrial pressure of 3 mmHg.   CT 01/24/20-IMPRESSION: Stable dilatation of the ascending aorta 3.9 cm. Given some motion artifact this is felt to be stable in appearance from the prior exam at which time it measured 4 cm. Recommend annual imaging followup by CTA or MRA. This recommendation follows 2010 ACCF/AHA/AATS/ACR/ASA/SCA/SCAI/SIR/STS/SVM Guidelines for the Diagnosis and Management of Patients with Thoracic Aortic Disease. Circulation. 2010; 121: T557-D220. Aortic aneurysm NOS (ICD10-I71.9)   Stable parenchymal nodules in the lungs bilaterally. Given their long-term stability since 2016 there are benign in etiology. No further follow-up is necessary.   Hyperdense lesion within the left lobe of the liver is stable. Given its stability from 2014, this is felt to be benign in etiology.  Assessment and Plan: 1. Persistent afib  In SR,does not report any afib Last successful cardioversion 03/13/20 Continue Tikosyn 500 mcg BID  Continue diltiazem 180 mg daily Continue  metoprolol succinate  at 12.5  mg in am,  12.5 mg pm Continue Xarelto 20 mg daily for a  CHADS2VASC score of at least 4  Bmet/mag today   2. Dilated aortic root  CT 01/23/20 showed stable dilation Will need yearly tests   3. OSA Followed by Dr Maxwell Caul  4. HTN Stable   5. CAD No anginal symptoms.   Follow up  with Dr. Rayann Heman in August as scheduled  In the AF clinic in 6 months for Tikosyn surveillance   [place]. Traevon Meiring, [facility] [address] [place], [place] [address] [phone]

## 2020-11-04 ENCOUNTER — Other Ambulatory Visit: Payer: Self-pay

## 2020-11-04 ENCOUNTER — Ambulatory Visit: Payer: Medicare PPO | Admitting: Internal Medicine

## 2020-11-04 VITALS — BP 132/72 | HR 73 | Ht 73.0 in | Wt 172.0 lb

## 2020-11-04 DIAGNOSIS — G4733 Obstructive sleep apnea (adult) (pediatric): Secondary | ICD-10-CM

## 2020-11-04 DIAGNOSIS — I2510 Atherosclerotic heart disease of native coronary artery without angina pectoris: Secondary | ICD-10-CM | POA: Diagnosis not present

## 2020-11-04 DIAGNOSIS — I4819 Other persistent atrial fibrillation: Secondary | ICD-10-CM | POA: Diagnosis not present

## 2020-11-04 DIAGNOSIS — I10 Essential (primary) hypertension: Secondary | ICD-10-CM | POA: Diagnosis not present

## 2020-11-04 DIAGNOSIS — I712 Thoracic aortic aneurysm, without rupture, unspecified: Secondary | ICD-10-CM

## 2020-11-04 DIAGNOSIS — I7789 Other specified disorders of arteries and arterioles: Secondary | ICD-10-CM | POA: Diagnosis not present

## 2020-11-04 MED ORDER — TADALAFIL 10 MG PO TABS: 10.0000 mg | ORAL_TABLET | Freq: Every day | ORAL | 1 refills | Status: DC | PRN

## 2020-11-04 NOTE — Progress Notes (Signed)
PCP: [name], MD Primary Cardiologist: Dr Claiborne Billings Primary EP: Dr Rayann Heman  [name] [name] is a 75 y.o. male who presents today for routine electrophysiology followup.  Since last being seen in our clinic, the patient reports doing very well.  Today, he denies symptoms of palpitations, chest pain, shortness of breath,  lower extremity edema, dizziness, presyncope, or syncope.  The patient is otherwise without complaint today.   Past Medical History:  Diagnosis Date   Cardiomyopathy ([place])    a. 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 b. also hx of tachycardia mediated CM, now resolved   Complication of anesthesia    Pt has anxiety issues and thinks he is going to die.   Depression    Echocardiogram abnormal    EF60%, mild LVH, PA pk pressure 35, pericardial effusion mild increase from 2012    H/O cardiac catheterization    no significant CAD by cath 5/12   Hyperlipidemia    Hypertension    Limb pain 06/20/2007   LE venous doppler - no evidence of thrombus or thrombophlebitis   Liver mass    a. CT 03/2013: Small probable cysts in the liver and small hyperenhancing liver mass.   OSA (obstructive sleep apnea)    AHI avg 5/hr   Paroxysmal atrial fibrillation ([place])    a. s/p afib ablations 2002 and 2009 by Dr Rolland Porter. b. s/p DCCV 02/2013. c. s/p DCCV 01/19/2014   Pericardial effusion    a. Since 2007. b. s/p pericardial window 04/2013.   PVC (premature ventricular contraction) 12/18/2009   cardionet monitor 21 days - some PVCs, ventricular bigeminy   Thoracic aortic aneurysm ([place])    a. Borderline enlarged by CT 12/14 at 4.0cm.   Past Surgical History:  Procedure Laterality Date   CARDIAC CATHETERIZATION  08/25/10   NO SIGNIFICANT CAD   CARDIAC ELECTROPHYSIOLOGY MAPPING AND ABLATION  2001,2009   Dr. Tally Due   CARDIOVASCULAR STRESS TEST  09/08/2005   R/S MV - EF 61%; Exercises capacity 12 Mets; essentially normal perfusion myocardial scan      CARDIOVERSION  10/18/2007   successful   CARDIOVERSION N/A 02/08/2013   Procedure: CARDIOVERSION;  Surgeon: Troy Sine, MD;  Location: [place];  Service: Cardiovascular;  Laterality: N/A;   CARDIOVERSION N/A 08/17/2013   Procedure: CARDIOVERSION;  Surgeon: Sanda Klein, MD;  Location: [place];  Service: Cardiovascular;  Laterality: N/A;   CARDIOVERSION N/A 12/28/2013   Procedure: CARDIOVERSION;  Surgeon: Fay Records, MD;  Location: [place];  Service: Cardiovascular;  Laterality: N/A;   CARDIOVERSION N/A 01/19/2014   Procedure: CARDIOVERSION;  Surgeon: Sanda Klein, MD;  Location: [facility] ENDOSCOPY;  Service: Cardiovascular;  Laterality: N/A;   CARDIOVERSION N/A 03/11/2014   Procedure: CARDIOVERSION;  Surgeon: Evans Lance, MD;  Location: [place];  Service: Cardiovascular;  Laterality: N/A;   CARDIOVERSION N/A 08/23/2014   Procedure: CARDIOVERSION;  Surgeon: Lelon Perla, MD;  Location: [facility] ENDOSCOPY;  Service: Cardiovascular;  Laterality: N/A;   CARDIOVERSION N/A 10/04/2014   Procedure: CARDIOVERSION;  Surgeon: Sueanne Margarita, MD;  Location: [facility] ENDOSCOPY;  Service: Cardiovascular;  Laterality: N/A;   CARDIOVERSION N/A 05/22/2015   Procedure: CARDIOVERSION;  Surgeon: Thayer Headings, MD;  Location: [place];  Service: Cardiovascular;  Laterality: N/A;   CARDIOVERSION N/A 10/08/2017   Procedure: CARDIOVERSION;  Surgeon: Pixie Casino, MD;  Location: [facility] ENDOSCOPY;  Service: Cardiovascular;  Laterality: N/A;   CARDIOVERSION N/A 03/13/2020   Procedure: CARDIOVERSION;  Surgeon: [name], MD;  Location: [place];  Service: Cardiovascular;  Laterality: N/A;   CARPAL TUNNEL RELEASE Left April 2015   ELBOW / UPPER ARM FOREIGN BODY REMOVAL     ELECTROPHYSIOLOGIC STUDY N/A 09/25/2014   Atrial fibrillation ablation performed by Dr Marinda Elk SIGMOIDOSCOPY N/A 08/28/2015   Procedure: FLEXIBLE SIGMOIDOSCOPY;  Surgeon: [name], MD;  Location: [facility] ENDOSCOPY;   Service: Endoscopy;  Laterality: N/A;   FRACTURE SURGERY Left    Left wrist   HOT HEMOSTASIS N/A 08/28/2015   Procedure: HOT HEMOSTASIS (ARGON PLASMA COAGULATION/BICAP);  Surgeon: [name], MD;  Location[facility];  Service: Endoscopy;  Laterality: N/A;   LEFT AND RIGHT HEART CATHETERIZATION WITH CORONARY ANGIOGRAM N/A 03/17/2013   Procedure: LEFT AND RIGHT HEART CATHETERIZATION WITH CORONARY ANGIOGRAM;  Surgeon: Troy Sine, MD;  Location: [facility] CATH LAB;  Service: Cardiovascular;  Laterality: N/A;   osteosynthesis Left 09/30/2012   with small plate in the ulna   SUBXYPHOID PERICARDIAL WINDOW N/A 04/25/2013   Procedure: SUBXYPHOID PERICARDIAL WINDOW;  Surgeon: Gaye Pollack, MD;  Location[facility] OR;  Service: Thoracic;  Laterality: N/A;   TEE WITHOUT CARDIOVERSION N/A 09/24/2014   Procedure: TRANSESOPHAGEAL ECHOCARDIOGRAM (TEE);  Surgeon: Dorothy Spark, MD;  Location: [facility] ENDOSCOPY;  Service: Cardiovascular;  Laterality: N/A;   TRANSESOPHAGEAL ECHOCARDIOGRAM  08/26/2010   see Notes tab    ROS- all systems are reviewed and negatives except as per HPI above  Current Outpatient Medications  Medication Sig Dispense Refill   atorvastatin (LIPITOR) 40 MG tablet Take 1 tablet (40 mg total) by mouth every evening. 90 tablet 3   b complex vitamins capsule Take 1 capsule by mouth daily.     CALCIUM-MAGNESIUM-ZINC PO Take 3 tablets by mouth at bedtime.      Cholecalciferol (VITAMIN D) 2000 UNITS CAPS Take 2,000 Units by mouth daily.     diltiazem (CARDIZEM CD) 180 MG 24 hr capsule TAKE 1 CAPSULE BY MOUTH EVERY DAY 90 capsule 3   dofetilide (TIKOSYN) 500 MCG capsule TAKE ONE CAPSULE BY MOUTH TWICE A DAY 180 capsule 1   finasteride (PROPECIA) 1 MG tablet Take 1 mg by mouth daily.     furosemide (LASIX) 40 MG tablet TAKE 1/2 TABLET BY MOUTH DAILY 45 tablet 3   metoprolol succinate (TOPROL-XL) 25 MG 24 hr tablet TAKE 0.5 TABLETS (12.5 MG TOTAL) BY MOUTH IN THE MORNING AND AT BEDTIME. USE FOR  AFIB 90 tablet 3   potassium chloride SA (KLOR-CON M20) 20 MEQ tablet TAKE ONE (1) TABLET (20 MEQ) BY MOUTH DAILY. 90 tablet 2   Psyllium (DAILY FIBER) 400 MG CAPS Take by mouth.     Saw Palmetto 450 MG CAPS Take 900 mg by mouth in the morning and at bedtime.      tadalafil (CIALIS) 10 MG tablet Take 1 tablet (10 mg total) by mouth daily as needed for erectile dysfunction. 10 tablet 2   XARELTO 20 MG TABS tablet TAKE 1 TABLET (20 MG TOTAL) BY MOUTH DAILY WITH SUPPER. 90 tablet 1   No current facility-administered medications for this visit.    Physical Exam: Vitals:   11/04/20 1545  BP: 132/72  Pulse: 73  SpO2: 96%  Weight: 172 lb (78 kg)  Height: '6\' 1"'$  (1.854 m)    GEN- The patient is well appearing, alert and oriented x 3 today.   Head- normocephalic, atraumatic Eyes-  Sclera clear, conjunctiva pink Ears- hearing intact Oropharynx- clear Lungs- Clear to  ausculation bilaterally, normal work of breathing Heart- Regular rate and rhythm, no murmurs, rubs or gallops, PMI not laterally displaced GI- soft, NT, ND, + BS Extremities- no clubbing, cyanosis, or edema  Wt Readings from Last 3 Encounters:  11/04/20 172 lb (78 kg)  10/01/20 172 lb 12.8 oz (78.4 kg)  04/09/20 182 lb 6.4 oz (82.7 kg)    EKG tracing ordered today is personally reviewed and shows sinus rhythm 73 bpm PR 212 msec, QTc 464 msec  Echo 01/09/20- EF 55%, mild LVH, aortic root 57m, no effusion  Chest CT 01/24/20- stable ascending aorta enlargement (3.9cm)  Assessment and Plan:  Persistent afib Remains in sinus with tikosyn  Required cardiovesrion 12/21 Continue xarelto for stroke prevention Chads2vasc score is 4 Bmet, mg from 6/22 reviewed  2. HTN Continue toprol 12.'5mg'$  daily  3. HL Continue lipitor '40mg'$  daily  4. CAD No ischemic symptoms No changes  5. Pericardial effusion Resolved by last echo  6. OSA Follows with Dr OMaxwell Caul 7. Aortic root enlargement CT from 01/23/20 is reviewed and  reveals stable ascending aorta measuring 3.9cm We will repeat CT and echo 10/22  8. ED Well treated with cialis We will refill today  Risks, benefits and potential toxicities for medications prescribed and/or refilled reviewed with patient today.   JThompson GrayerMD, FAurora Behavioral Healthcare-Santa Rosa8/04/2020 4:01 PM

## 2020-11-04 NOTE — Patient Instructions (Addendum)
Medication Instructions:  Your physician recommends that you continue on your current medications as directed. Please refer to the Current Medication list given to you today.  Labwork: None ordered.  Testing/Procedures: Echo & CTA in [place] has requested that you have an echocardiogram. Echocardiography is a painless test that uses sound waves to create images of your heart. It provides your doctor with information about the size and shape of your heart and how well your heart's chambers and valves are working. This procedure takes approximately one hour. There are no restrictions for this procedure.   Non-Cardiac CT Angiography (CTA), is a special type of CT scan that uses a computer to produce multi-dimensional views of major blood vessels throughout the body. In CT angiography, a contrast material is injected through an IV to help visualize the blood vessels   Follow-Up: Your physician wants you to follow-up in: 6 months with the Afib Clinic. They will contact you to schedule.    Any Other Special Instructions Will Be Listed Below (If Applicable).  If you need a refill on your cardiac medications before your next appointment, please call your pharmacy.

## 2020-11-07 ENCOUNTER — Other Ambulatory Visit: Payer: Self-pay | Admitting: Internal Medicine

## 2020-11-08 NOTE — Telephone Encounter (Signed)
Prescription refill request for Xarelto received.  Indication: Afib Last office visit: 11/04/20 ([place]) Weight: 78kg Age: 75 Scr: 0.98 (03/07/20) CrCl: 72.45m/min  Appropriate dose and refill sent to requested pharmacy.

## 2020-12-10 DIAGNOSIS — H43813 Vitreous degeneration, bilateral: Secondary | ICD-10-CM | POA: Diagnosis not present

## 2020-12-10 DIAGNOSIS — H35033 Hypertensive retinopathy, bilateral: Secondary | ICD-10-CM | POA: Diagnosis not present

## 2020-12-10 DIAGNOSIS — H18603 Keratoconus, unspecified, bilateral: Secondary | ICD-10-CM | POA: Diagnosis not present

## 2020-12-10 DIAGNOSIS — Z961 Presence of intraocular lens: Secondary | ICD-10-CM | POA: Diagnosis not present

## 2020-12-26 ENCOUNTER — Other Ambulatory Visit: Payer: Self-pay

## 2020-12-26 MED ORDER — DOFETILIDE 500 MCG PO CAPS
500.0000 ug | ORAL_CAPSULE | Freq: Two times a day (BID) | ORAL | 3 refills | Status: DC
Start: 2020-12-26 — End: 2021-03-12

## 2021-01-01 DIAGNOSIS — G4733 Obstructive sleep apnea (adult) (pediatric): Secondary | ICD-10-CM | POA: Diagnosis not present

## 2021-01-13 ENCOUNTER — Other Ambulatory Visit: Payer: Medicare PPO

## 2021-01-14 ENCOUNTER — Other Ambulatory Visit: Payer: Self-pay

## 2021-01-14 ENCOUNTER — Other Ambulatory Visit: Payer: Medicare PPO

## 2021-01-14 ENCOUNTER — Other Ambulatory Visit: Payer: Medicare PPO | Admitting: *Deleted

## 2021-01-14 ENCOUNTER — Other Ambulatory Visit (HOSPITAL_COMMUNITY): Payer: Medicare PPO

## 2021-01-14 DIAGNOSIS — I4819 Other persistent atrial fibrillation: Secondary | ICD-10-CM | POA: Diagnosis not present

## 2021-01-14 DIAGNOSIS — I7789 Other specified disorders of arteries and arterioles: Secondary | ICD-10-CM

## 2021-01-14 DIAGNOSIS — I712 Thoracic aortic aneurysm, without rupture, unspecified: Secondary | ICD-10-CM | POA: Diagnosis not present

## 2021-01-14 DIAGNOSIS — I10 Essential (primary) hypertension: Secondary | ICD-10-CM

## 2021-01-14 DIAGNOSIS — G4733 Obstructive sleep apnea (adult) (pediatric): Secondary | ICD-10-CM | POA: Diagnosis not present

## 2021-01-14 DIAGNOSIS — I2510 Atherosclerotic heart disease of native coronary artery without angina pectoris: Secondary | ICD-10-CM | POA: Diagnosis not present

## 2021-01-15 DIAGNOSIS — G4733 Obstructive sleep apnea (adult) (pediatric): Secondary | ICD-10-CM | POA: Diagnosis not present

## 2021-01-15 DIAGNOSIS — G4731 Primary central sleep apnea: Secondary | ICD-10-CM | POA: Diagnosis not present

## 2021-01-15 LAB — BASIC METABOLIC PANEL
BUN/Creatinine Ratio: 23 (ref 10–24)
BUN: 18 mg/dL (ref 8–27)
CO2: 24 mmol/L (ref 20–29)
Calcium: 9.4 mg/dL (ref 8.6–10.2)
Chloride: 102 mmol/L (ref 96–106)
Creatinine, Ser: 0.79 mg/dL (ref 0.76–1.27)
Glucose: 87 mg/dL (ref 70–99)
Potassium: 4.1 mmol/L (ref 3.5–5.2)
Sodium: 141 mmol/L (ref 134–144)
eGFR: 93 mL/min/{1.73_m2} (ref >59)

## 2021-01-17 ENCOUNTER — Other Ambulatory Visit: Payer: Self-pay

## 2021-01-17 ENCOUNTER — Ambulatory Visit (INDEPENDENT_AMBULATORY_CARE_PROVIDER_SITE_OTHER)
Admission: RE | Admit: 2021-01-17 | Discharge: 2021-01-17 | Disposition: A | Discharge status: Home / Self Care | Payer: Medicare PPO | Source: Ambulatory Visit | Attending: Internal Medicine | Admitting: Internal Medicine

## 2021-01-17 ENCOUNTER — Ambulatory Visit (HOSPITAL_COMMUNITY): Payer: Medicare PPO | Attending: Internal Medicine

## 2021-01-17 DIAGNOSIS — I10 Essential (primary) hypertension: Secondary | ICD-10-CM

## 2021-01-17 DIAGNOSIS — I4819 Other persistent atrial fibrillation: Secondary | ICD-10-CM

## 2021-01-17 DIAGNOSIS — G4733 Obstructive sleep apnea (adult) (pediatric): Secondary | ICD-10-CM | POA: Diagnosis not present

## 2021-01-17 DIAGNOSIS — N281 Cyst of kidney, acquired: Secondary | ICD-10-CM | POA: Diagnosis not present

## 2021-01-17 DIAGNOSIS — I7789 Other specified disorders of arteries and arterioles: Secondary | ICD-10-CM

## 2021-01-17 DIAGNOSIS — I7121 Aneurysm of the ascending aorta, without rupture: Secondary | ICD-10-CM | POA: Diagnosis not present

## 2021-01-17 DIAGNOSIS — I7122 Aneurysm of the aortic arch, without rupture: Secondary | ICD-10-CM | POA: Diagnosis not present

## 2021-01-17 DIAGNOSIS — I2510 Atherosclerotic heart disease of native coronary artery without angina pectoris: Secondary | ICD-10-CM

## 2021-01-17 DIAGNOSIS — I712 Thoracic aortic aneurysm, without rupture, unspecified: Secondary | ICD-10-CM

## 2021-01-17 LAB — ECHOCARDIOGRAM COMPLETE
Area-P 1/2: 3.53 cm2
P 1/2 time: 509 msec
S' Lateral: 2.8 cm

## 2021-01-17 MED ORDER — IOHEXOL 350 MG/ML SOLN
100.0000 mL | Freq: Once | INTRAVENOUS | Status: AC | PRN
  Administered 2021-01-17: 100 mL via INTRAVENOUS

## 2021-01-22 ENCOUNTER — Telehealth: Payer: Self-pay | Admitting: *Deleted

## 2021-01-22 DIAGNOSIS — I7789 Other specified disorders of arteries and arterioles: Secondary | ICD-10-CM

## 2021-01-22 NOTE — Telephone Encounter (Signed)
-----   Message from Thompson Grayer, MD sent at 01/22/2021  3:04 PM EDT ----- Results reviewed.  Otila Kluver, please inform pt of result. We should probably refer to TCTS to follow his aortic enlargement going forward.

## 2021-01-27 ENCOUNTER — Other Ambulatory Visit: Payer: Self-pay

## 2021-01-27 ENCOUNTER — Encounter: Payer: Self-pay | Admitting: Physician Assistant

## 2021-01-27 ENCOUNTER — Institutional Professional Consult (permissible substitution): Payer: Medicare PPO | Admitting: Physician Assistant

## 2021-01-27 VITALS — BP 132/78 | HR 80 | Resp 20 | Ht 73.0 in | Wt 172.0 lb

## 2021-01-27 DIAGNOSIS — E785 Hyperlipidemia, unspecified: Secondary | ICD-10-CM | POA: Diagnosis not present

## 2021-01-27 DIAGNOSIS — I10 Essential (primary) hypertension: Secondary | ICD-10-CM | POA: Diagnosis not present

## 2021-01-27 DIAGNOSIS — I712 Thoracic aortic aneurysm, without rupture, unspecified: Secondary | ICD-10-CM | POA: Diagnosis not present

## 2021-01-27 DIAGNOSIS — I4891 Unspecified atrial fibrillation: Secondary | ICD-10-CM | POA: Diagnosis not present

## 2021-01-27 DIAGNOSIS — I7121 Aneurysm of the ascending aorta, without rupture: Secondary | ICD-10-CM | POA: Diagnosis not present

## 2021-01-27 DIAGNOSIS — I2510 Atherosclerotic heart disease of native coronary artery without angina pectoris: Secondary | ICD-10-CM | POA: Diagnosis not present

## 2021-01-27 DIAGNOSIS — G4733 Obstructive sleep apnea (adult) (pediatric): Secondary | ICD-10-CM | POA: Diagnosis not present

## 2021-01-27 NOTE — Progress Notes (Signed)
[name] [name]       [place] [address]             [phone]        [facility] [place] Medical Record #[id number] Date of Birth: 08/15/1945  Referring: Thompson Grayer, MD Primary Care: [name], MD Primary Cardiologist:None  Chief Complaint:   No chief complaint on file.   History of Present Illness:      [name] [name] is a 75 year old male patient who was referred to our practice through cardiology.  He has a past medical history significant for persistent atrial fibrillation, hypertension, hyperlipidemia, coronary artery disease, OSA, and history of a pericardial effusion who presents today for an initial consultation for an ascending aortic aneurysm.  He was found to have a stable 4.0 cm fusiform ascending thoracic aortic aneurysm on CT scan.  Last year on CT scan it measured 3.9 cm.  He is a former smoker but quit several years ago.  He has been followed by Dr. Rayann Heman for both his hypertension and atrial fibrillation. He has had a long standing history of atrial fibrillation since 1992 and has been on several different medications.  He has had a total of 75 cardioversions and 3 ablations.  He has now finally obtained good control of his atrial fibrillation on his current medication regimen.  He did have a mesh placed for previous pericardial effusion which is now resolved.  Today he does not have any chest pain or shortness of breath.  He remains very active exercising an hour a day of both cardiovascular exercise and strength training.  He also maintains a mostly vegetarian diet with very occasional red meat.  He has lost over 50 pounds and has maintained that weight loss.    Current Activity/ Functional Status: Patient is independent with mobility/ambulation, transfers, ADL's, IADL's.   Zubrod Score: At the time of surgery this patient's most appropriate activity status/level should be described as: [x]     0    Normal activity, no symptoms []      1    Restricted in physical strenuous activity but ambulatory, able to do out light work []     2    Ambulatory and capable of self care, unable to do work activities, up and about                 more than 50%  Of the time                            []     3    Only limited self care, in bed greater than 50% of waking hours []     4    Completely disabled, no self care, confined to bed or chair []     5    Moribund  Past Medical History:  Diagnosis Date   Cardiomyopathy ([place])    a. 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 b. also hx of tachycardia mediated CM, now resolved   Complication of anesthesia    Pt has anxiety issues and thinks he is going to die.   Depression    Echocardiogram abnormal    EF60%, mild LVH, PA pk pressure 35, pericardial effusion mild increase from 2012    H/O cardiac catheterization    no significant CAD by cath 5/12   Hyperlipidemia    Hypertension    Limb pain 06/20/2007  LE venous doppler - no evidence of thrombus or thrombophlebitis   Liver mass    a. CT 03/2013: Small probable cysts in the liver and small hyperenhancing liver mass.   OSA (obstructive sleep apnea)    AHI avg 5/hr   Paroxysmal atrial fibrillation ([place])    a. s/p afib ablations 2002 and 2009 by Dr Rolland Porter. b. s/p DCCV 02/2013. c. s/p DCCV 01/19/2014   Pericardial effusion    a. Since 2007. b. s/p pericardial window 04/2013.   PVC (premature ventricular contraction) 12/18/2009   cardionet monitor 21 days - some PVCs, ventricular bigeminy   Thoracic aortic aneurysm    a. Borderline enlarged by CT 12/14 at 4.0cm.    Past Surgical History:  Procedure Laterality Date   CARDIAC CATHETERIZATION  08/25/10   NO SIGNIFICANT CAD   CARDIAC ELECTROPHYSIOLOGY MAPPING AND ABLATION  2001,2009   Dr. Tally Due   CARDIOVASCULAR STRESS TEST  09/08/2005   R/S MV - EF 61%; Exercises capacity 12 Mets; essentially normal perfusion myocardial scan     CARDIOVERSION  10/18/2007   successful    CARDIOVERSION N/A 02/08/2013   Procedure: CARDIOVERSION;  Surgeon: Troy Sine, MD;  Location: [facility];  Service: Cardiovascular;  Laterality: N/A;   CARDIOVERSION N/A 08/17/2013   Procedure: CARDIOVERSION;  Surgeon: Sanda Klein, MD;  Location: [place];  Service: Cardiovascular;  Laterality: N/A;   CARDIOVERSION N/A 12/28/2013   Procedure: CARDIOVERSION;  Surgeon: Fay Records, MD;  Location: [place];  Service: Cardiovascular;  Laterality: N/A;   CARDIOVERSION N/A 01/19/2014   Procedure: CARDIOVERSION;  Surgeon: Sanda Klein, MD;  Location: [facility] ENDOSCOPY;  Service: Cardiovascular;  Laterality: N/A;   CARDIOVERSION N/A 03/11/2014   Procedure: CARDIOVERSION;  Surgeon: Evans Lance, MD;  Location: [place];  Service: Cardiovascular;  Laterality: N/A;   CARDIOVERSION N/A 08/23/2014   Procedure: CARDIOVERSION;  Surgeon: Lelon Perla, MD;  Location: [place];  Service: Cardiovascular;  Laterality: N/A;   CARDIOVERSION N/A 10/04/2014   Procedure: CARDIOVERSION;  Surgeon: Sueanne Margarita, MD;  Location: [facility] ENDOSCOPY;  Service: Cardiovascular;  Laterality: N/A;   CARDIOVERSION N/A 05/22/2015   Procedure: CARDIOVERSION;  Surgeon: Thayer Headings, MD;  Location: [place];  Service: Cardiovascular;  Laterality: N/A;   CARDIOVERSION N/A 10/08/2017   Procedure: CARDIOVERSION;  Surgeon: Pixie Casino, MD;  Location: [place];  Service: Cardiovascular;  Laterality: N/A;   CARDIOVERSION N/A 03/13/2020   Procedure: CARDIOVERSION;  Surgeon: [name], MD;  Location: [place];  Service: Cardiovascular;  Laterality: N/A;   CARPAL TUNNEL RELEASE Left April 2015   ELBOW / UPPER ARM FOREIGN BODY REMOVAL     ELECTROPHYSIOLOGIC STUDY N/A 09/25/2014   Atrial fibrillation ablation performed by Dr Marinda Elk SIGMOIDOSCOPY N/A 08/28/2015   Procedure: FLEXIBLE SIGMOIDOSCOPY;  Surgeon: [name], MD;  Location: [facility] ENDOSCOPY;  Service: Endoscopy;  Laterality: N/A;    FRACTURE SURGERY Left    Left wrist   HOT HEMOSTASIS N/A 08/28/2015   Procedure: HOT HEMOSTASIS (ARGON PLASMA COAGULATION/BICAP);  Surgeon: [name], MD;  Location: [facility] ENDOSCOPY;  Service: Endoscopy;  Laterality: N/A;   LEFT AND RIGHT HEART CATHETERIZATION WITH CORONARY ANGIOGRAM N/A 03/17/2013   Procedure: LEFT AND RIGHT HEART CATHETERIZATION WITH CORONARY ANGIOGRAM;  Surgeon: Troy Sine, MD;  Location: [facility] CATH LAB;  Service: Cardiovascular;  Laterality: N/A;   osteosynthesis Left 09/30/2012   with small plate in the ulna   SUBXYPHOID PERICARDIAL WINDOW N/A 04/25/2013   Procedure: SUBXYPHOID  PERICARDIAL WINDOW;  Surgeon: Gaye Pollack, MD;  Location: [facility] OR;  Service: Thoracic;  Laterality: N/A;   TEE WITHOUT CARDIOVERSION N/A 09/24/2014   Procedure: TRANSESOPHAGEAL ECHOCARDIOGRAM (TEE);  Surgeon: Dorothy Spark, MD;  Location: [facility] ENDOSCOPY;  Service: Cardiovascular;  Laterality: N/A;   TRANSESOPHAGEAL ECHOCARDIOGRAM  08/26/2010   see Notes tab    Social History   Tobacco Use  Smoking Status Former   Packs/day: 1.00   Years: 10.00   Pack years: 10.00   Types: Cigarettes   Quit date: 02/06/1989   Years since quitting: 31.9  Smokeless Tobacco Never    Social History   Substance and Sexual Activity  Alcohol Use No   Comment: 2 bottles of wine per week, pt states has not drank alcohol since christmas     Allergies  Allergen Reactions   Tetracyclines & Related Hives    Current Outpatient Medications  Medication Sig Dispense Refill   atorvastatin (LIPITOR) 40 MG tablet Take 1 tablet (40 mg total) by mouth every evening. 90 tablet 3   b complex vitamins capsule Take 1 capsule by mouth daily.     CALCIUM-MAGNESIUM-ZINC PO Take 3 tablets by mouth at bedtime.      Cholecalciferol (VITAMIN D) 2000 UNITS CAPS Take 2,000 Units by mouth daily.     diltiazem (CARDIZEM CD) 180 MG 24 hr capsule TAKE 1 CAPSULE BY MOUTH EVERY DAY 90 capsule 3   dofetilide (TIKOSYN) 500 MCG  capsule Take 1 capsule (500 mcg total) by mouth 2 (two) times daily. 180 capsule 3   finasteride (PROPECIA) 1 MG tablet Take 1 mg by mouth daily.     furosemide (LASIX) 40 MG tablet TAKE 1/2 TABLET BY MOUTH DAILY 45 tablet 3   metoprolol succinate (TOPROL-XL) 25 MG 24 hr tablet TAKE 0.5 TABLETS (12.5 MG TOTAL) BY MOUTH IN THE MORNING AND AT BEDTIME. USE FOR AFIB 90 tablet 3   potassium chloride SA (KLOR-CON M20) 20 MEQ tablet TAKE ONE (1) TABLET (20 MEQ) BY MOUTH DAILY. 90 tablet 2   Psyllium (DAILY FIBER) 400 MG CAPS Take by mouth.     Saw Palmetto 450 MG CAPS Take 900 mg by mouth in the morning and at bedtime.      tadalafil (CIALIS) 10 MG tablet Take 1 tablet (10 mg total) by mouth daily as needed for erectile dysfunction. 90 tablet 1   XARELTO 20 MG TABS tablet TAKE 1 TABLET BY MOUTH DAILY WITH SUPPER. 90 tablet 1   No current facility-administered medications for this visit.    (Not in a hospital admission)   Family History  Problem Relation Age of Onset   Coronary artery disease Father    Heart attack Father    Stroke Mother        died   Atrial fibrillation Brother    Mitral valve prolapse Sister      Review of Systems:   ROS Pertinent items are noted in HPI.     Physical Exam: There were no vitals taken for this visit. Vitals:   01/27/21 0956  BP: 132/78  Pulse: 80  Resp: 20  SpO2: 97%     General appearance: alert, cooperative, and no distress Neck: no carotid bruit and no JVD Resp: clear to auscultation bilaterally Cardio: regular rate and rhythm, S1, S2 normal, no murmur, click, rub or gallop GI: soft, non-tender; bowel sounds normal; no masses,  no organomegaly Extremities: extremities normal, atraumatic, no cyanosis or edema Neurologic: Grossly normal  Diagnostic Studies &  Laboratory data:  CLINICAL DATA:  4 cm ascending thoracic aortic aneurysm. Surveillance imaging.   EXAM: CT ANGIOGRAPHY CHEST WITH CONTRAST   TECHNIQUE: Multidetector CT  imaging of the chest was performed using the standard protocol during bolus administration of intravenous contrast. Multiplanar CT image reconstructions and MIPs were obtained to evaluate the vascular anatomy.   CONTRAST:  179mL OMNIPAQUE IOHEXOL 350 MG/ML SOLN   COMPARISON:  01/23/2020   FINDINGS: Cardiovascular: Stable mild fusiform aneurysmal dilatation of the ascending thoracic aorta, maximal diameter 4 cm. Similar mild thoracic aortic atherosclerosis. Negative for dissection. No mediastinal hemorrhage or hematoma. Patent 2 vessel arch anatomy. No mediastinal hemorrhage or hematoma. Central pulmonary arteries are patent. Native coronary atherosclerosis noted. Normal heart size. No pericardial effusion. Central venous structures are patent. No veno-occlusive process.   Mediastinum/Nodes: No enlarged mediastinal, hilar, or axillary lymph nodes. Thyroid gland, trachea, and esophagus demonstrate no significant findings.   Lungs/Pleura: Similar minor left lower lobe scarring. No acute airspace process, collapse or consolidation. Negative for edema or interstitial disease. No pleural abnormality, effusion or pneumothorax. Trachea and central airways are patent.   Upper Abdomen: Stable scattered hypodense liver cysts. Stable subcentimeter hyperenhancing focus in the left hepatic lobe, image 95, favored to be transient hepatic attenuation defect versus small hemangioma. Stable upper pole renal cysts. No acute upper abdominal finding.   Musculoskeletal: No chest wall abnormality. No acute or significant osseous findings.   Review of the MIP images confirms the above findings.   IMPRESSION: Stable mild fusiform aneurysm dilatation of the ascending thoracic aorta, maximal diameter 4 cm.   Recommend annual imaging followup by CTA or MRA. This recommendation follows 2010 ACCF/AHA/AATS/ACR/ASA/SCA/SCAI/SIR/STS/SVM Guidelines for the Diagnosis and Management of Patients with  Thoracic Aortic Disease. Circulation. 2010; 121: H885-O277. Aortic aneurysm NOS (ICD10-I71.9)   No other acute intrathoracic vascular or nonvascular finding.   Additional stable benign findings as above.   Aortic Atherosclerosis (ICD10-I70.0).     Electronically Signed   By: Jerilynn Mages.  Shick M.D.   On: 01/18/2021 10:18         Recent Radiology Findings:   No results found.   I have independently reviewed the above radiologic studies and discussed with the patient   Recent Lab Findings: Lab Results  Component Value Date   WBC 6.3 03/07/2020   HGB 12.4 (L) 03/07/2020   HCT 39.6 03/07/2020   PLT 243 03/07/2020   GLUCOSE 87 01/14/2021   CHOL  08/23/2007    188        ATP III CLASSIFICATION:  <200     mg/dL   Desirable  200-239  mg/dL   Borderline High  >=240    mg/dL   High   TRIG 63 08/23/2007   HDL 47 08/23/2007   LDLCALC (H) 08/23/2007    128        Total Cholesterol/HDL:CHD Risk Coronary Heart Disease Risk Table                     Men   Women  1/2 Average Risk   3.4   3.3   ALT 30 04/24/2013   AST 28 04/24/2013   NA 141 01/14/2021   K 4.1 01/14/2021   CL 102 01/14/2021   CREATININE 0.79 01/14/2021   BUN 18 01/14/2021   CO2 24 01/14/2021   TSH 0.812 08/22/2010   INR 0.90 04/24/2013      Assessment / Plan:      Stable mild fusiform aneurysm dilatation of the  ascending thoracic aorta, maximal diameter 4 cm Atrial fibrillation- well controlled at the moment on his current medications. Followed by Dr. Rayann Heman Hypertension- well controlled on current regimen OSA CAD Hyperlipidemia  Plan: I think you will be fine for [name] [name] to return to our office in 1 year with follow-up CTA to monitor his fusiform ascending thoracic aortic aneurysm.  He is encouraged to call our office if he has any further questions or concerns at this time.  He is to follow-up with his cardiologist Dr. Rayann Heman for his other medical conditions.  Blood pressure has been well controlled  according to the patient.  He does maintain a very active lifestyle with healthy eating habits.  He has maintained an over 50 pound weight loss.   I  spent 40 minutes counseling the patient face to face.   Nicholes Rough, PA-C 01/27/2021 9:04 AM

## 2021-02-06 ENCOUNTER — Other Ambulatory Visit (HOSPITAL_COMMUNITY): Payer: Self-pay

## 2021-02-06 MED ORDER — FUROSEMIDE 40 MG PO TABS
20.0000 mg | ORAL_TABLET | Freq: Every day | ORAL | 3 refills | Status: DC
Start: 2021-02-06 — End: 2021-03-12

## 2021-03-05 ENCOUNTER — Encounter: Payer: Self-pay | Admitting: Internal Medicine

## 2021-03-10 ENCOUNTER — Telehealth (HOSPITAL_COMMUNITY): Payer: Self-pay | Admitting: *Deleted

## 2021-03-10 NOTE — Telephone Encounter (Signed)
Patient called in stating he has been back in AFib since Friday/Saturday persistently. Currently his heart rate is 120 pt very anxious. Outpatient cardioversions are about 2 weeks out pt given information to ER as patient is not sure he wants to wait 2 weeks.

## 2021-03-12 ENCOUNTER — Other Ambulatory Visit: Payer: Self-pay

## 2021-03-12 ENCOUNTER — Encounter (HOSPITAL_COMMUNITY): Payer: Self-pay

## 2021-03-12 ENCOUNTER — Ambulatory Visit (HOSPITAL_COMMUNITY)
Admission: RE | Admit: 2021-03-12 | Discharge: 2021-03-12 | Disposition: A | Discharge status: Home / Self Care | Payer: Medicare PPO | Source: Ambulatory Visit | Attending: Nurse Practitioner | Admitting: Nurse Practitioner

## 2021-03-12 ENCOUNTER — Encounter (HOSPITAL_COMMUNITY): Payer: Self-pay | Admitting: Nurse Practitioner

## 2021-03-12 VITALS — BP 114/80 | HR 123 | Ht 73.0 in | Wt 174.2 lb

## 2021-03-12 DIAGNOSIS — I2510 Atherosclerotic heart disease of native coronary artery without angina pectoris: Secondary | ICD-10-CM | POA: Insufficient documentation

## 2021-03-12 DIAGNOSIS — I10 Essential (primary) hypertension: Secondary | ICD-10-CM | POA: Diagnosis not present

## 2021-03-12 DIAGNOSIS — D6869 Other thrombophilia: Secondary | ICD-10-CM | POA: Diagnosis not present

## 2021-03-12 DIAGNOSIS — I4892 Unspecified atrial flutter: Secondary | ICD-10-CM | POA: Insufficient documentation

## 2021-03-12 DIAGNOSIS — G4733 Obstructive sleep apnea (adult) (pediatric): Secondary | ICD-10-CM | POA: Insufficient documentation

## 2021-03-12 DIAGNOSIS — Z87891 Personal history of nicotine dependence: Secondary | ICD-10-CM | POA: Insufficient documentation

## 2021-03-12 DIAGNOSIS — I77819 Aortic ectasia, unspecified site: Secondary | ICD-10-CM | POA: Diagnosis not present

## 2021-03-12 DIAGNOSIS — I4819 Other persistent atrial fibrillation: Secondary | ICD-10-CM | POA: Diagnosis not present

## 2021-03-12 DIAGNOSIS — Z7901 Long term (current) use of anticoagulants: Secondary | ICD-10-CM | POA: Insufficient documentation

## 2021-03-12 LAB — CBC
HCT: 39.3 % (ref 39.0–52.0)
Hemoglobin: 12.7 g/dL — ABNORMAL LOW (ref 13.0–17.0)
MCH: 28.8 pg (ref 26.0–34.0)
MCHC: 32.3 g/dL (ref 30.0–36.0)
MCV: 89.1 fL (ref 80.0–100.0)
Platelets: 238 10*3/uL (ref 150–400)
RBC: 4.41 MIL/uL (ref 4.22–5.81)
RDW: 13.6 % (ref 11.5–15.5)
WBC: 6.1 10*3/uL (ref 4.0–10.5)
nRBC: 0 % (ref 0.0–0.2)

## 2021-03-12 LAB — BASIC METABOLIC PANEL
Anion gap: 7 (ref 5–15)
BUN: 20 mg/dL (ref 8–23)
CO2: 26 mmol/L (ref 22–32)
Calcium: 9.3 mg/dL (ref 8.9–10.3)
Chloride: 106 mmol/L (ref 98–111)
Creatinine, Ser: 0.77 mg/dL (ref 0.61–1.24)
GFR, Estimated: >60 mL/min (ref >60)
Glucose, Bld: 104 mg/dL — ABNORMAL HIGH (ref 70–99)
Potassium: 5 mmol/L (ref 3.5–5.1)
Sodium: 139 mmol/L (ref 135–145)

## 2021-03-12 LAB — MAGNESIUM: Magnesium: 2.2 mg/dL (ref 1.7–2.4)

## 2021-03-12 MED ORDER — POTASSIUM CHLORIDE CRYS ER 20 MEQ PO TBCR: EXTENDED_RELEASE_TABLET | ORAL | 2 refills | Status: DC

## 2021-03-12 MED ORDER — RIVAROXABAN 20 MG PO TABS: 20.0000 mg | ORAL_TABLET | Freq: Every day | ORAL | 2 refills | Status: AC

## 2021-03-12 MED ORDER — DOFETILIDE 500 MCG PO CAPS: 500.0000 ug | ORAL_CAPSULE | Freq: Two times a day (BID) | ORAL | 3 refills | Status: AC

## 2021-03-12 MED ORDER — DILTIAZEM HCL ER COATED BEADS 180 MG PO CP24: ORAL_CAPSULE | ORAL | 2 refills | Status: AC

## 2021-03-12 MED ORDER — FUROSEMIDE 40 MG PO TABS: 20.0000 mg | ORAL_TABLET | Freq: Every day | ORAL | 3 refills | Status: AC

## 2021-03-12 MED ORDER — METOPROLOL SUCCINATE ER 25 MG PO TB24: 12.5000 mg | ORAL_TABLET | Freq: Two times a day (BID) | ORAL | 2 refills | Status: DC

## 2021-03-12 NOTE — Progress Notes (Signed)
Primary Care Physician: [name], MD Referring Physician: Dr. Macon Large is a 75 y.o. male with a h/o very complex afib history requiring 80 + cardioversions, ablations and tikosyn, over many years of having afib. Patient has done well from an afib standpoint until this past weekend 11/17/19 when he noticed much more fatigue during exercise. The next morning he checked his HR which was elevated. He is in rapid afib today. There were no specific triggers that he could identify.   F/u in afib clinic, 8/25. He is here to f/u cardioversion, but converted himself the am of cardioversion. 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 EKG shows SR with first degree AV block at 61 bpm.   F/u afib clinic, 01/18/20. He is now back from [place], he had a successful play and felt great. He remains in SR. He did not have any afib in the [place] area. His echo showed mod aortic root dilation and he is pending a CT of the chest on Monday to further evaluate.   F/u in afib clinc, 03/07/20.  for return of afib several days ago. I increased his BB as he had RVR. His BP is soft today at 06/74. Ekg shows afib at 124 bpm. No known trigger. No missed anticoagulation  and has had vaccines.   Returns  to clinic 03/20/20 s/p successful cardioversion. He remains in SR and feels great.   F/u in afib clinic, 10/01/20. No afib to report he feels well.   F/u in afib clinic, 03/12/21 as he returned to afib since 12/2. No specific trigger. He has moved to [place], [place], but still has his house here. He has returned to town to wait for cardioversion. No missed doses. He has atrial flutter at 123 bpm on EKG.   Today, he denies symptoms of chest pain, shortness of breath, orthopnea, PND, lower extremity edema, dizziness, presyncope, syncope, or neurologic sequela.  The patient is tolerating medications without difficulties and is otherwise without complaint today.   Past  Medical History:  Diagnosis Date   Cardiomyopathy ([place])    a. 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 b. also hx of tachycardia mediated CM, now resolved   Complication of anesthesia    Pt has anxiety issues and thinks he is going to die.   Depression    Echocardiogram abnormal    EF60%, mild LVH, PA pk pressure 35, pericardial effusion mild increase from 2012    H/O cardiac catheterization    no significant CAD by cath 5/12   Hyperlipidemia    Hypertension    Limb pain 06/20/2007   LE venous doppler - no evidence of thrombus or thrombophlebitis   Liver mass    a. CT 03/2013: Small probable cysts in the liver and small hyperenhancing liver mass.   OSA (obstructive sleep apnea)    AHI avg 5/hr   Paroxysmal atrial fibrillation ([place])    a. s/p afib ablations 2002 and 2009 by Dr Rolland Porter. b. s/p DCCV 02/2013. c. s/p DCCV 01/19/2014   Pericardial effusion    a. Since 2007. b. s/p pericardial window 04/2013.   PVC (premature ventricular contraction) 12/18/2009   cardionet monitor 21 days - some PVCs, ventricular bigeminy   Thoracic aortic aneurysm    a. Borderline enlarged by CT 12/14 at 4.0cm.   Past Surgical History:  Procedure Laterality Date   CARDIAC CATHETERIZATION  08/25/10   NO SIGNIFICANT CAD   CARDIAC ELECTROPHYSIOLOGY MAPPING AND ABLATION  2001,2009   Dr. Tally Due   CARDIOVASCULAR STRESS TEST  09/08/2005   R/S MV - EF 61%; Exercises capacity 12 Mets; essentially normal perfusion myocardial scan     CARDIOVERSION  10/18/2007   successful   CARDIOVERSION N/A 02/08/2013   Procedure: CARDIOVERSION;  Surgeon: Troy Sine, MD;  Location: [place];  Service: Cardiovascular;  Laterality: N/A;   CARDIOVERSION N/A 08/17/2013   Procedure: CARDIOVERSION;  Surgeon: Sanda Klein, MD;  Location: [place];  Service: Cardiovascular;  Laterality: N/A;   CARDIOVERSION N/A 12/28/2013   Procedure: CARDIOVERSION;  Surgeon: Fay Records, MD;  Location: [place];   Service: Cardiovascular;  Laterality: N/A;   CARDIOVERSION N/A 01/19/2014   Procedure: CARDIOVERSION;  Surgeon: Sanda Klein, MD;  Location: [facility] ENDOSCOPY;  Service: Cardiovascular;  Laterality: N/A;   CARDIOVERSION N/A 03/11/2014   Procedure: CARDIOVERSION;  Surgeon: Evans Lance, MD;  Location: [place];  Service: Cardiovascular;  Laterality: N/A;   CARDIOVERSION N/A 08/23/2014   Procedure: CARDIOVERSION;  Surgeon: Lelon Perla, MD;  Location: [place];  Service: Cardiovascular;  Laterality: N/A;   CARDIOVERSION N/A 10/04/2014   Procedure: CARDIOVERSION;  Surgeon: Sueanne Margarita, MD;  Location: [facility] ENDOSCOPY;  Service: Cardiovascular;  Laterality: N/A;   CARDIOVERSION N/A 05/22/2015   Procedure: CARDIOVERSION;  Surgeon: Thayer Headings, MD;  Location: [place];  Service: Cardiovascular;  Laterality: N/A;   CARDIOVERSION N/A 10/08/2017   Procedure: CARDIOVERSION;  Surgeon: Pixie Casino, MD;  Location: [place];  Service: Cardiovascular;  Laterality: N/A;   CARDIOVERSION N/A 03/13/2020   Procedure: CARDIOVERSION;  Surgeon: [name], MD;  Location: [place];  Service: Cardiovascular;  Laterality: N/A;   CARPAL TUNNEL RELEASE Left April 2015   ELBOW / UPPER ARM FOREIGN BODY REMOVAL     ELECTROPHYSIOLOGIC STUDY N/A 09/25/2014   Atrial fibrillation ablation performed by Dr Marinda Elk SIGMOIDOSCOPY N/A 08/28/2015   Procedure: FLEXIBLE SIGMOIDOSCOPY;  Surgeon: [name], MD;  Location: [facility] ENDOSCOPY;  Service: Endoscopy;  Laterality: N/A;   FRACTURE SURGERY Left    Left wrist   HOT HEMOSTASIS N/A 08/28/2015   Procedure: HOT HEMOSTASIS (ARGON PLASMA COAGULATION/BICAP);  Surgeon: [name], MD;  Location: [facility] ENDOSCOPY;  Service: Endoscopy;  Laterality: N/A;   LEFT AND RIGHT HEART CATHETERIZATION WITH CORONARY ANGIOGRAM N/A 03/17/2013   Procedure: LEFT AND RIGHT HEART CATHETERIZATION WITH CORONARY ANGIOGRAM;  Surgeon: Troy Sine, MD;  Location: [facility]  CATH LAB;  Service: Cardiovascular;  Laterality: N/A;   osteosynthesis Left 09/30/2012   with small plate in the ulna   SUBXYPHOID PERICARDIAL WINDOW N/A 04/25/2013   Procedure: SUBXYPHOID PERICARDIAL WINDOW;  Surgeon: Gaye Pollack, MD;  Location: [facility] OR;  Service: Thoracic;  Laterality: N/A;   TEE WITHOUT CARDIOVERSION N/A 09/24/2014   Procedure: TRANSESOPHAGEAL ECHOCARDIOGRAM (TEE);  Surgeon: Dorothy Spark, MD;  Location: [facility] ENDOSCOPY;  Service: Cardiovascular;  Laterality: N/A;   TRANSESOPHAGEAL ECHOCARDIOGRAM  08/26/2010   see Notes tab    Current Outpatient Medications  Medication Sig Dispense Refill   atorvastatin (LIPITOR) 40 MG tablet Take 1 tablet (40 mg total) by mouth every evening. 90 tablet 3   b complex vitamins capsule Take 1 capsule by mouth daily.     CALCIUM-MAGNESIUM-ZINC PO Take 2 tablets by mouth at bedtime.     Cholecalciferol (VITAMIN D) 2000 UNITS CAPS Take 2,000 Units by mouth daily.  finasteride (PROPECIA) 1 MG tablet Take 1 mg by mouth daily.     Psyllium (DAILY FIBER) 400 MG CAPS Take 3 capsules by mouth 2 (two) times daily.     Saw Palmetto 450 MG CAPS Take 900 mg by mouth in the morning and at bedtime.      tadalafil (CIALIS) 10 MG tablet Take 1 tablet (10 mg total) by mouth daily as needed for erectile dysfunction. 90 tablet 1   diltiazem (CARDIZEM CD) 180 MG 24 hr capsule TAKE 1 CAPSULE BY MOUTH EVERY DAY 90 capsule 2   dofetilide (TIKOSYN) 500 MCG capsule Take 1 capsule (500 mcg total) by mouth 2 (two) times daily. 180 capsule 3   furosemide (LASIX) 40 MG tablet Take 0.5 tablets (20 mg total) by mouth daily. 45 tablet 3   metoprolol succinate (TOPROL-XL) 25 MG 24 hr tablet Take 0.5 tablets (12.5 mg total) by mouth in the morning and at bedtime. May use extra tablet daily for breakthrough 120 tablet 2   potassium chloride SA (KLOR-CON M20) 20 MEQ tablet TAKE ONE (1) TABLET (20 MEQ) BY MOUTH DAILY. 90 tablet 2   rivaroxaban (XARELTO) 20 MG TABS tablet Take  1 tablet (20 mg total) by mouth daily with supper. 90 tablet 2   No current facility-administered medications for this encounter.    Allergies  Allergen Reactions   Tetracyclines & Related Hives    Social History   Socioeconomic History   Marital status: Single    Spouse name: Not on file   Number of children: 0   Years of education: Not on file   Highest education level: Not on file  Occupational History    Employer: [employer]  Tobacco Use   Smoking status: Former    Packs/day: 1.00    Years: 10.00    Pack years: 10.00    Types: Cigarettes    Quit date: 02/06/1989    Years since quitting: 32.1   Smokeless tobacco: Never  Vaping Use   Vaping Use: Never used  Substance and Sexual Activity   Alcohol use: No    Comment: 2 bottles of wine per week, pt states has not drank alcohol since christmas   Drug use: No   Sexual activity: Not on file  Other Topics Concern   Not on file  Social History Narrative   The patient has a Conservator, 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   Social Determinants of Health   Financial Resource Strain: Not on file  Food Insecurity: Not on file  Transportation Needs: Not on file  Physical Activity: Not on file  Stress: Not on file  Social Connections: Not on file  Intimate Partner Violence: Not on file    Family History  Problem Relation Age of Onset   Coronary artery disease Father    Heart attack Father    Stroke Mother        died   Atrial fibrillation Brother    Mitral valve prolapse Sister     ROS- All systems are reviewed and negative except as per the HPI above  Physical Exam: Vitals:   03/12/21 1506  BP: 114/80  Pulse: (!) 123  Weight: 79 kg  Height: 6\' 1"  (1.854 m)   Wt Readings from Last 3 Encounters:  03/12/21 79 kg  01/27/21 78 kg  11/04/20 78 kg  Labs: Lab Results   Component Value Date   NA 141 01/14/2021   K 4.1 01/14/2021   CL 102 01/14/2021   CO2 24 01/14/2021   GLUCOSE 87 01/14/2021   BUN 18 01/14/2021   CREATININE 0.79 01/14/2021   CALCIUM 9.4 01/14/2021   MG 2.3 10/01/2020   Lab Results  Component Value Date   INR 0.90 04/24/2013   Lab Results  Component Value Date   CHOL  08/23/2007    188        ATP III CLASSIFICATION:  <200     mg/dL   Desirable  200-239  mg/dL   Borderline High  >=240    mg/dL   High   HDL 47 08/23/2007   LDLCALC (H) 08/23/2007    128        Total Cholesterol/HDL:CHD Risk Coronary Heart Disease Risk Table                     Men   Women  1/2 Average Risk   3.4   3.3   TRIG 63 08/23/2007    GEN- The patient is well appearing, alert and oriented x 3 today.   HEENT-head normocephalic, atraumatic, sclera clear, conjunctiva pink, hearing intact, trachea midline. Lungs- Clear to ausculation bilaterally, normal work of breathing Heart -regular rate and rhythm, tachycardia, no murmurs, rubs or gallops  GI- soft, NT, ND, + BS Extremities- no clubbing, cyanosis, or edema MS- no significant deformity or atrophy Skin- no rash or lesion Psych- euthymic mood, full affect Neuro- strength and sensation are intact   EKG Vent. rate 123 BPM PR interval * ms QRS duration 90 ms QT/QTcB 292/418 ms P-R-T axes 97 82 25 Atrial flutter with variable A-V block Septal infarct , age undetermined Abnormal ECG  Echo IMPRESSIONS     1. Moderately dilated aortic root (4.6 cm); suggest CTA or MRA to further  assess.   2. Left ventricular ejection fraction, by estimation, is 55 to 60%. The  left ventricle has normal function. The left ventricle has no regional  wall motion abnormalities. There is mild left ventricular hypertrophy of  the basal-septal segment. Left  ventricular diastolic parameters were normal.   3. Right ventricular systolic function is normal. The right ventricular  size is normal. Tricuspid  regurgitation signal is inadequate for assessing  PA pressure.   4. Left atrial size was mildly dilated.   5. The mitral valve is normal in structure. Trivial mitral valve  regurgitation. No evidence of mitral stenosis.   6. The aortic valve is tricuspid. Aortic valve regurgitation is trivial.  Mild aortic valve sclerosis is present, with no evidence of aortic valve  stenosis.   7. Aortic dilatation noted. There is moderate dilatation of the aortic  root, measuring 46 mm.   8. The inferior vena cava is normal in size with greater than 50%  respiratory variability, suggesting right atrial pressure of 3 mmHg.   CT 01/24/20-IMPRESSION: Stable dilatation of the ascending aorta 3.9 cm. Given some motion artifact this is felt to be stable in appearance from the prior exam at which time it measured 4 cm. Recommend annual imaging followup by CTA or MRA. This recommendation follows 2010 ACCF/AHA/AATS/ACR/ASA/SCA/SCAI/SIR/STS/SVM Guidelines for the Diagnosis and Management of Patients with Thoracic Aortic Disease. Circulation. 2010; 121: Q964-R838. Aortic aneurysm NOS (ICD10-I71.9)   Stable parenchymal nodules in the lungs bilaterally. Given their long-term stability since 2016 there are benign in etiology. No further follow-up is necessary.   Hyperdense  lesion within the left lobe of the liver is stable. Given its stability from 2014, this is felt to be benign in etiology.  Assessment and Plan: 1. Persistent afib  In atrial flutter since 12/1 Last successful cardioversion 03/13/20 Will plan on cardioversion as pt usually will not spontaneously convert Continue Tikosyn 500 mcg BID  Continue diltiazem 180 mg daily Increase  metoprolol succinate to 25 mg in am,  25 mg pm and go back to 12.5 mg bid day of cardioversion  Continue Xarelto 20 mg daily for a CHADS2VASC score of at least 4,s tates no missed doses for at least 3 weeks  Bmet/mag today   2. Dilated aortic root  CT 10/115/22 showed  stable dilation Will need yearly tests   3. OSA Followed by Dr Maxwell Caul  4. HTN Stable   5. CAD No anginal symptoms.    He will send Kardia strips via My chart one week after cardioversion to prevent the 2 1/2 drive from  [place]. Kallyn Demarcus, [facility] [address] [place], [place] [address] [phone]

## 2021-03-12 NOTE — Patient Instructions (Addendum)
Increase metoprolol to 25mg  twice a day until day of cardioversion then go back to 12.5mg  twice a day  Cardioversion scheduled for Wednesday, December 21st  - Arrive at the [facility] and go to admitting at [facility] not eat or drink anything after midnight the night prior to your procedure.  - Take all your morning medication (except diabetic medications) with a sip of water prior to arrival.  - You will not be able to drive home after your procedure.  - Do NOT miss any doses of your blood thinner - if you should miss a dose please notify our office immediately.  - If you feel as if you go back into normal rhythm prior to scheduled cardioversion, please notify our office immediately. If your procedure is canceled in the cardioversion suite you will be charged a cancellation fee. Patients will be asked to: to mask in public and hand hygiene (no longer quarantine) in the 3 days prior to surgery, to report if any COVID-19-like illness or household contacts to COVID-19 to determine need for testing      Send kardia strip one week after cardioversion via [employer]

## 2021-03-14 DIAGNOSIS — N403 Nodular prostate with lower urinary tract symptoms: Secondary | ICD-10-CM | POA: Diagnosis not present

## 2021-03-17 ENCOUNTER — Encounter (HOSPITAL_COMMUNITY): Payer: Self-pay | Admitting: Internal Medicine

## 2021-03-17 DIAGNOSIS — E7800 Pure hypercholesterolemia, unspecified: Secondary | ICD-10-CM | POA: Diagnosis not present

## 2021-03-17 DIAGNOSIS — I10 Essential (primary) hypertension: Secondary | ICD-10-CM | POA: Diagnosis not present

## 2021-03-17 DIAGNOSIS — Z125 Encounter for screening for malignant neoplasm of prostate: Secondary | ICD-10-CM | POA: Diagnosis not present

## 2021-03-18 DIAGNOSIS — N5201 Erectile dysfunction due to arterial insufficiency: Secondary | ICD-10-CM | POA: Diagnosis not present

## 2021-03-18 DIAGNOSIS — R350 Frequency of micturition: Secondary | ICD-10-CM | POA: Diagnosis not present

## 2021-03-18 DIAGNOSIS — N403 Nodular prostate with lower urinary tract symptoms: Secondary | ICD-10-CM | POA: Diagnosis not present

## 2021-03-18 NOTE — Progress Notes (Signed)
Attempted to obtain medical history via telephone, unable to reach at this time. I left a voicemail to return pre surgical testing department's phone call.  

## 2021-03-20 ENCOUNTER — Other Ambulatory Visit: Payer: Self-pay

## 2021-03-20 ENCOUNTER — Ambulatory Visit (HOSPITAL_COMMUNITY)
Admission: RE | Admit: 2021-03-20 | Discharge: 2021-03-20 | Disposition: A | Discharge status: Home / Self Care | Payer: Medicare PPO | Source: Ambulatory Visit | Attending: Nurse Practitioner | Admitting: Nurse Practitioner

## 2021-03-20 ENCOUNTER — Encounter (HOSPITAL_COMMUNITY): Payer: Self-pay | Admitting: Nurse Practitioner

## 2021-03-20 VITALS — BP 134/76 | HR 67

## 2021-03-20 DIAGNOSIS — D6869 Other thrombophilia: Secondary | ICD-10-CM | POA: Diagnosis not present

## 2021-03-20 DIAGNOSIS — I4819 Other persistent atrial fibrillation: Secondary | ICD-10-CM | POA: Insufficient documentation

## 2021-03-20 NOTE — Progress Notes (Signed)
Pt in for EKG as he felt he returned to SR earlier in week and was scheduled for a cardioversion 12/21. EKG confirms that he is back in SR at 67 bpm, pr int 220 ms, qrs int 92 ms, qtc 441 ms. Cardioversion cancelled. Will see him back in afib clinic  in 6 months.

## 2021-03-24 DIAGNOSIS — I10 Essential (primary) hypertension: Secondary | ICD-10-CM | POA: Diagnosis not present

## 2021-03-24 DIAGNOSIS — E7800 Pure hypercholesterolemia, unspecified: Secondary | ICD-10-CM | POA: Diagnosis not present

## 2021-03-24 DIAGNOSIS — D649 Anemia, unspecified: Secondary | ICD-10-CM | POA: Diagnosis not present

## 2021-03-24 DIAGNOSIS — Z0000 Encounter for general adult medical examination without abnormal findings: Secondary | ICD-10-CM | POA: Diagnosis not present

## 2021-03-24 DIAGNOSIS — N402 Nodular prostate without lower urinary tract symptoms: Secondary | ICD-10-CM | POA: Diagnosis not present

## 2021-03-24 DIAGNOSIS — G4733 Obstructive sleep apnea (adult) (pediatric): Secondary | ICD-10-CM | POA: Diagnosis not present

## 2021-03-24 DIAGNOSIS — I4819 Other persistent atrial fibrillation: Secondary | ICD-10-CM | POA: Diagnosis not present

## 2021-03-24 DIAGNOSIS — N529 Male erectile dysfunction, unspecified: Secondary | ICD-10-CM | POA: Diagnosis not present

## 2021-03-26 ENCOUNTER — Ambulatory Visit (HOSPITAL_COMMUNITY): Admission: RE | Admit: 2021-03-26 | Payer: Medicare PPO | Source: Home / Self Care | Admitting: Internal Medicine

## 2021-03-26 ENCOUNTER — Encounter (HOSPITAL_COMMUNITY): Admission: RE | Payer: Self-pay | Source: Home / Self Care

## 2021-03-26 SURGERY — CARDIOVERSION
Anesthesia: General

## 2021-05-08 ENCOUNTER — Encounter (HOSPITAL_COMMUNITY): Payer: Self-pay

## 2021-05-09 ENCOUNTER — Encounter: Payer: Self-pay | Admitting: Internal Medicine

## 2021-06-24 DIAGNOSIS — R899 Unspecified abnormal finding in specimens from other organs, systems and tissues: Secondary | ICD-10-CM | POA: Diagnosis not present

## 2021-06-24 DIAGNOSIS — D649 Anemia, unspecified: Secondary | ICD-10-CM | POA: Diagnosis not present

## 2021-06-24 DIAGNOSIS — L989 Disorder of the skin and subcutaneous tissue, unspecified: Secondary | ICD-10-CM | POA: Diagnosis not present

## 2021-06-24 DIAGNOSIS — N403 Nodular prostate with lower urinary tract symptoms: Secondary | ICD-10-CM | POA: Diagnosis not present

## 2021-07-11 ENCOUNTER — Encounter: Payer: Self-pay | Admitting: Internal Medicine

## 2021-07-11 DIAGNOSIS — I10 Essential (primary) hypertension: Secondary | ICD-10-CM

## 2021-07-11 DIAGNOSIS — I7789 Other specified disorders of arteries and arterioles: Secondary | ICD-10-CM

## 2021-07-11 DIAGNOSIS — G4733 Obstructive sleep apnea (adult) (pediatric): Secondary | ICD-10-CM

## 2021-07-11 DIAGNOSIS — I2510 Atherosclerotic heart disease of native coronary artery without angina pectoris: Secondary | ICD-10-CM

## 2021-07-11 DIAGNOSIS — I4819 Other persistent atrial fibrillation: Secondary | ICD-10-CM

## 2021-07-14 ENCOUNTER — Encounter (HOSPITAL_COMMUNITY): Payer: Self-pay

## 2021-07-23 ENCOUNTER — Encounter (HOSPITAL_COMMUNITY): Payer: Self-pay | Admitting: Nurse Practitioner

## 2021-07-23 ENCOUNTER — Ambulatory Visit (HOSPITAL_COMMUNITY)
Admission: RE | Admit: 2021-07-23 | Discharge: 2021-07-23 | Disposition: A | Discharge status: Home / Self Care | Payer: Medicare PPO | Source: Ambulatory Visit | Attending: Nurse Practitioner | Admitting: Nurse Practitioner

## 2021-07-23 VITALS — BP 118/72 | HR 133 | Ht 73.0 in | Wt 179.8 lb

## 2021-07-23 DIAGNOSIS — G4733 Obstructive sleep apnea (adult) (pediatric): Secondary | ICD-10-CM | POA: Diagnosis not present

## 2021-07-23 DIAGNOSIS — I2510 Atherosclerotic heart disease of native coronary artery without angina pectoris: Secondary | ICD-10-CM | POA: Insufficient documentation

## 2021-07-23 DIAGNOSIS — I77819 Aortic ectasia, unspecified site: Secondary | ICD-10-CM | POA: Insufficient documentation

## 2021-07-23 DIAGNOSIS — Z7901 Long term (current) use of anticoagulants: Secondary | ICD-10-CM | POA: Insufficient documentation

## 2021-07-23 DIAGNOSIS — Z87891 Personal history of nicotine dependence: Secondary | ICD-10-CM | POA: Diagnosis not present

## 2021-07-23 DIAGNOSIS — D6869 Other thrombophilia: Secondary | ICD-10-CM | POA: Diagnosis not present

## 2021-07-23 DIAGNOSIS — I10 Essential (primary) hypertension: Secondary | ICD-10-CM | POA: Diagnosis not present

## 2021-07-23 DIAGNOSIS — I4819 Other persistent atrial fibrillation: Secondary | ICD-10-CM | POA: Diagnosis not present

## 2021-07-23 LAB — MAGNESIUM: Magnesium: 2.1 mg/dL (ref 1.7–2.4)

## 2021-07-23 LAB — BASIC METABOLIC PANEL
Anion gap: 6 (ref 5–15)
BUN: 16 mg/dL (ref 8–23)
CO2: 27 mmol/L (ref 22–32)
Calcium: 9.3 mg/dL (ref 8.9–10.3)
Chloride: 107 mmol/L (ref 98–111)
Creatinine, Ser: 0.91 mg/dL (ref 0.61–1.24)
GFR, Estimated: >60 mL/min (ref >60)
Glucose, Bld: 97 mg/dL (ref 70–99)
Potassium: 4.7 mmol/L (ref 3.5–5.1)
Sodium: 140 mmol/L (ref 135–145)

## 2021-07-23 LAB — CBC
HCT: 36.9 % — ABNORMAL LOW (ref 39.0–52.0)
Hemoglobin: 11.7 g/dL — ABNORMAL LOW (ref 13.0–17.0)
MCH: 28.3 pg (ref 26.0–34.0)
MCHC: 31.7 g/dL (ref 30.0–36.0)
MCV: 89.3 fL (ref 80.0–100.0)
Platelets: 210 10*3/uL (ref 150–400)
RBC: 4.13 MIL/uL — ABNORMAL LOW (ref 4.22–5.81)
RDW: 13.9 % (ref 11.5–15.5)
WBC: 5.1 10*3/uL (ref 4.0–10.5)
nRBC: 0 % (ref 0.0–0.2)

## 2021-07-23 MED ORDER — TADALAFIL 5 MG PO TABS: 5.0000 mg | ORAL_TABLET | Freq: Every day | ORAL | Status: AC

## 2021-07-23 MED ORDER — METOPROLOL SUCCINATE ER 25 MG PO TB24
ORAL_TABLET | ORAL | 3 refills | Status: DC
Start: 2021-07-23 — End: 2021-08-05

## 2021-07-23 MED ORDER — METOPROLOL SUCCINATE ER 25 MG PO TB24: ORAL_TABLET | ORAL | Status: DC

## 2021-07-23 NOTE — H&P (View-Only) (Signed)
? ?Primary Care Physician: [name], MD ?Referring Physician: Dr. Rayann Heman ? ? ?[name] [name] is a 76 y.o. male with a h/o very complex afib history requiring 80 + cardioversions, ablations and tikosyn, over many years of having afib. Patient has done well from an afib standpoint until this past weekend 11/17/19 when he noticed much more fatigue during exercise. The next morning he checked his HR which was elevated. He is in rapid afib today. There were no specific triggers that he could identify.  ? ?F/u in afib clinic, 8/25. He is here to f/u cardioversion, but converted himself the am of cardioversion. 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 EKG shows SR with first degree AV block at 61 bpm.  ? ?F/u afib clinic, 01/18/20. He is now back from [place], he had a successful play and felt great. He remains in SR. He did not have any afib in the [place] area. His echo showed mod aortic root dilation and he is pending a CT of the chest on Monday to further evaluate.  ? ?F/u in afib clinc, 03/07/20.  for return of afib several days ago. I increased his BB as he had RVR. His BP is soft today at 06/74. Ekg shows afib at 124 bpm. No known trigger. No missed anticoagulation  and has had vaccines.  ? ?Returns  to clinic 03/20/20 s/p successful cardioversion. He remains in SR and feels great.  ? ?F/u in afib clinic, 10/01/20. No afib to report he feels well.  ? ?F/u in afib clinic, 03/12/21 as he returned to afib since 12/2. No specific trigger. He has moved to [place], [place], but still has his house here. He has returned to town to wait for cardioversion. No missed doses. He has atrial flutter at 123 bpm on EKG.  ? ?F/u in afib clinic, 07/23/21, he went into afib one [place] the [place]. He requires cardioversion when he goes out of rhythm. He has RVR despite increasing BB. No missed anticoagulation.  ? ?Today, he denies symptoms of chest pain, shortness of breath,  orthopnea, PND, lower extremity edema, dizziness, presyncope, syncope, or neurologic sequela.  The patient is tolerating medications without difficulties and is otherwise without complaint today.  ? ?Past Medical History:  ?Diagnosis Date  ? Cardiomyopathy ([place])   ? a. 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 b. also hx of tachycardia mediated CM, now resolved  ? Complication of anesthesia   ? Pt has anxiety issues and thinks he is going to die.  ? Depression   ? Echocardiogram abnormal   ? EF60%, mild LVH, PA pk pressure 35, pericardial effusion mild increase from 2012   ? H/O cardiac catheterization   ? no significant CAD by cath 5/12  ? Hyperlipidemia   ? Hypertension   ? Limb pain 06/20/2007  ? LE venous doppler - no evidence of thrombus or thrombophlebitis  ? Liver mass   ? a. CT 03/2013: Small probable cysts in the liver and small hyperenhancing liver mass.  ? OSA (obstructive sleep apnea)   ? AHI avg 5/hr  ? Paroxysmal atrial fibrillation ([facility])   ? a. s/p afib ablations 2002 and 2009 by Dr Rolland Porter. b. s/p DCCV 02/2013. c. s/p DCCV 01/19/2014  ? Pericardial effusion   ? a. Since 2007. b. s/p pericardial window 04/2013.  ? PVC (premature ventricular contraction) 12/18/2009  ? cardionet monitor  21 days - some PVCs, ventricular bigeminy  ? Thoracic aortic aneurysm   ? a. Borderline enlarged by CT 12/14 at 4.0cm.  ? ?Past Surgical History:  ?Procedure Laterality Date  ? CARDIAC CATHETERIZATION  08/25/10  ? NO SIGNIFICANT CAD  ? CARDIAC ELECTROPHYSIOLOGY MAPPING AND ABLATION  [id number]  ? Dr. Tally Due  ? CARDIOVASCULAR STRESS TEST  09/08/2005  ? R/S MV - EF 61%; Exercises capacity 12 Mets; essentially normal perfusion myocardial scan    ? CARDIOVERSION  10/18/2007  ? successful  ? CARDIOVERSION N/A 02/08/2013  ? Procedure: CARDIOVERSION;  Surgeon: Troy Sine, MD;  Location: [place];  Service: Cardiovascular;  Laterality: N/A;  ? CARDIOVERSION N/A 08/17/2013  ? Procedure: CARDIOVERSION;  Surgeon:  Sanda Klein, MD;  Location: [place] ENDOSCOPY;  Service: Cardiovascular;  Laterality: N/A;  ? CARDIOVERSION N/A 12/28/2013  ? Procedure: CARDIOVERSION;  Surgeon: Fay Records, MD;  Location: [place];  Service: Cardiovascular;  Laterality: N/A;  ? CARDIOVERSION N/A 01/19/2014  ? Procedure: CARDIOVERSION;  Surgeon: Sanda Klein, MD;  Location: [facility] ENDOSCOPY;  Service: Cardiovascular;  Laterality: N/A;  ? CARDIOVERSION N/A 03/11/2014  ? Procedure: CARDIOVERSION;  Surgeon: Evans Lance, MD;  Location: [place];  Service: Cardiovascular;  Laterality: N/A;  ? CARDIOVERSION N/A 08/23/2014  ? Procedure: CARDIOVERSION;  Surgeon: Lelon Perla, MD;  Location: [place];  Service: Cardiovascular;  Laterality: N/A;  ? CARDIOVERSION N/A 10/04/2014  ? Procedure: CARDIOVERSION;  Surgeon: Sueanne Margarita, MD;  Location: [place];  Service: Cardiovascular;  Laterality: N/A;  ? CARDIOVERSION N/A 05/22/2015  ? Procedure: CARDIOVERSION;  Surgeon: Thayer Headings, MD;  Location: [place];  Service: Cardiovascular;  Laterality: N/A;  ? CARDIOVERSION N/A 10/08/2017  ? Procedure: CARDIOVERSION;  Surgeon: Pixie Casino, MD;  Location: [place];  Service: Cardiovascular;  Laterality: N/A;  ? CARDIOVERSION N/A 03/13/2020  ? Procedure: CARDIOVERSION;  Surgeon: [name], MD;  Location: [place];  Service: Cardiovascular;  Laterality: N/A;  ? CARPAL TUNNEL RELEASE Left April 2015  ? ELBOW / UPPER ARM FOREIGN BODY REMOVAL    ? ELECTROPHYSIOLOGIC STUDY N/A 09/25/2014  ? Atrial fibrillation ablation performed by Dr Rayann Heman  ? FLEXIBLE SIGMOIDOSCOPY N/A 08/28/2015  ? Procedure: FLEXIBLE SIGMOIDOSCOPY;  Surgeon: [name], MD;  Location: [facility] ENDOSCOPY;  Service: Endoscopy;  Laterality: N/A;  ? FRACTURE SURGERY Left   ? Left wrist  ? HOT HEMOSTASIS N/A 08/28/2015  ? Procedure: HOT HEMOSTASIS (ARGON PLASMA COAGULATION/BICAP);  Surgeon: [name], MD;  Location: [facility] ENDOS[facility];  Service: Endoscopy;  Laterality: N/A;   ? LEFT AND RIGHT HEART CATHETERIZATION WITH CORONARY ANGIOGRAM N/A 03/17/2013  ? Procedure: LEFT AND RIGHT HEART CATHETERIZATION WITH CORONARY ANGIOGRAM;  Surgeon: Troy Sine, MD;  Location: [facility] CATH LAB;  Service: Cardiovascular;  Laterality: N/A;  ? osteosynthesis Left 09/30/2012  ? with small plate in the ulna  ? SUBXYPHOID PERICARDIAL WINDOW N/A 04/25/2013  ? Procedure: SUBXYPHOID PERICARDIAL WINDOW;  Surgeon: Gaye Pollack, MD;  Location: [place] OR;  Service: Thoracic;  Laterality: N/A;  ? TEE WITHOUT CARDIOVERSION N/A 09/24/2014  ? Procedure: TRANSESOPHAGEAL ECHOCARDIOGRAM (TEE);  Surgeon: Dorothy Spark, MD;  Location: [facility] ENDOSCOPY;  Service: Cardiovascular;  Laterality: N/A;  ? TRANSESOPHAGEAL ECHOCARDIOGRAM  08/26/2010  ? see Notes tab  ? ? ?Current Outpatient Medications  ?Medication Sig Dispense Refill  ? atorvastatin (LIPITOR) 40 MG tablet Take 1 tablet (40 mg total) by mouth every evening. 90 tablet 3  ? b complex vitamins capsule Take 1  capsule by mouth daily.    ? CALCIUM-MAGNESIUM-ZINC PO Take 2 tablets by mouth at bedtime.    ? Cholecalciferol (VITAMIN D) 2000 UNITS CAPS Take 2,000 Units by mouth daily.    ? diltiazem (CARDIZEM CD) 180 MG 24 hr capsule TAKE 1 CAPSULE BY MOUTH EVERY DAY 90 capsule 2  ? dofetilide (TIKOSYN) 500 MCG capsule Take 1 capsule (500 mcg total) by mouth 2 (two) times daily. 180 capsule 3  ? finasteride (PROPECIA) 1 MG tablet Take 1 mg by mouth daily.    ? furosemide (LASIX) 40 MG tablet Take 0.5 tablets (20 mg total) by mouth daily. 45 tablet 3  ? metoprolol succinate (TOPROL-XL) 25 MG 24 hr tablet Take 0.5 tablets (12.5 mg total) by mouth in the morning and at bedtime. May use extra tablet daily for breakthrough (Patient taking differently: Take 25 mg by mouth in the morning and at bedtime. May use extra tablet daily for breakthrough) 120 tablet 2  ? potassium chloride SA (KLOR-CON M20) 20 MEQ tablet TAKE ONE (1) TABLET (20 MEQ) BY MOUTH DAILY. 90 tablet 2  ? Psyllium (DAILY  FIBER) 400 MG CAPS Take 3 capsules by mouth 2 (two) times daily.    ? rivaroxaban (XARELTO) 20 MG TABS tablet Take 1 tablet (20 mg total) by mouth daily with supper. 90 tablet 2  ? Saw Palmetto 450 MG CA

## 2021-07-23 NOTE — Progress Notes (Signed)
? ?Primary Care Physician: [name], MD ?Referring Physician: Dr. Rayann Heman ? ? ?[name] [name] is a 76 y.o. male with a h/o very complex afib history requiring 80 + cardioversions, ablations and tikosyn, over many years of having afib. Patient has done well from an afib standpoint until this past weekend 11/17/19 when he noticed much more fatigue during exercise. The next morning he checked his HR which was elevated. He is in rapid afib today. There were no specific triggers that he could identify.  ? ?F/u in afib clinic, 8/25. He is here to f/u cardioversion, but converted himself the am of cardioversion. 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 EKG shows SR with first degree AV block at 61 bpm.  ? ?F/u afib clinic, 01/18/20. He is now back from [place], he had a successful play and felt great. He remains in SR. He did not have any afib in the [place] area. His echo showed mod aortic root dilation and he is pending a CT of the chest on Monday to further evaluate.  ? ?F/u in afib clinc, 03/07/20.  for return of afib several days ago. I increased his BB as he had RVR. His BP is soft today at 06/74. Ekg shows afib at 124 bpm. No known trigger. No missed anticoagulation  and has had vaccines.  ? ?Returns  to clinic 03/20/20 s/p successful cardioversion. He remains in SR and feels great.  ? ?F/u in afib clinic, 10/01/20. No afib to report he feels well.  ? ?F/u in afib clinic, 03/12/21 as he returned to afib since 12/2. No specific trigger. He has moved to [place], [place], but still has his house here. He has returned to town to wait for cardioversion. No missed doses. He has atrial flutter at 123 bpm on EKG.  ? ?F/u in afib clinic, 07/23/21, he went into afib one week ago while at the beach. He requires cardioversion when he goes out of rhythm. He has RVR despite increasing BB. No missed anticoagulation.  ? ?Today, he denies symptoms of chest pain, shortness of breath,  orthopnea, PND, lower extremity edema, dizziness, presyncope, syncope, or neurologic sequela.  The patient is tolerating medications without difficulties and is otherwise without complaint today.  ? ?Past Medical History:  ?Diagnosis Date  ? Cardiomyopathy ([place])   ? a. 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 b. also hx of tachycardia mediated CM, now resolved  ? Complication of anesthesia   ? Pt has anxiety issues and thinks he is going to die.  ? Depression   ? Echocardiogram abnormal   ? EF60%, mild LVH, PA pk pressure 35, pericardial effusion mild increase from 2012   ? H/O cardiac catheterization   ? no significant CAD by cath 5/12  ? Hyperlipidemia   ? Hypertension   ? Limb pain 06/20/2007  ? LE venous doppler - no evidence of thrombus or thrombophlebitis  ? Liver mass   ? a. CT 03/2013: Small probable cysts in the liver and small hyperenhancing liver mass.  ? OSA (obstructive sleep apnea)   ? AHI avg 5/hr  ? Paroxysmal atrial fibrillation ([facility]CC)   ? a. s/p afib ablations 2002 and 2009 by Dr Rolland Porter. b. s/p DCCV 02/2013. c. s/p DCCV 01/19/2014  ? Pericardial effusion   ? a. Since 2007. b. s/p pericardial window 04/2013.  ? PVC (premature ventricular contraction) 12/18/2009  ? cardionet monitor  21 days - some PVCs, ventricular bigeminy  ? Thoracic aortic aneurysm   ? a. Borderline enlarged by CT 12/14 at 4.0cm.  ? ?Past Surgical History:  ?Procedure Laterality Date  ? CARDIAC CATHETERIZATION  08/25/10  ? NO SIGNIFICANT CAD  ? CARDIAC ELECTROPHYSIOLOGY MAPPING AND ABLATION  4854,6270  ? Dr. Tally Due  ? CARDIOVASCULAR STRESS TEST  09/08/2005  ? R/S MV - EF 61%; Exercises capacity 12 Mets; essentially normal perfusion myocardial scan    ? CARDIOVERSION  10/18/2007  ? successful  ? CARDIOVERSION N/A 02/08/2013  ? Procedure: CARDIOVERSION;  Surgeon: Troy Sine, MD;  Location: [facility];  Service: Cardiovascular;  Laterality: N/A;  ? CARDIOVERSION N/A 08/17/2013  ? Procedure: CARDIOVERSION;  Surgeon:  Sanda Klein, MD;  Location: [facility];  Service: Cardiovascular;  Laterality: N/A;  ? CARDIOVERSION N/A 12/28/2013  ? Procedure: CARDIOVERSION;  Surgeon: Fay Records, MD;  Location: [facility];  Service: Cardiovascular;  Laterality: N/A;  ? CARDIOVERSION N/A 01/19/2014  ? Procedure: CARDIOVERSION;  Surgeon: Sanda Klein, MD;  Location: [facility];  Service: Cardiovascular;  Laterality: N/A;  ? CARDIOVERSION N/A 03/11/2014  ? Procedure: CARDIOVERSION;  Surgeon: Evans Lance, MD;  Location: [facility];  Service: Cardiovascular;  Laterality: N/A;  ? CARDIOVERSION N/A 08/23/2014  ? Procedure: CARDIOVERSION;  Surgeon: Lelon Perla, MD;  Location: [facility];  Service: Cardiovascular;  Laterality: N/A;  ? CARDIOVERSION N/A 10/04/2014  ? Procedure: CARDIOVERSION;  Surgeon: Sueanne Margarita, MD;  Location: [facility];  Service: Cardiovascular;  Laterality: N/A;  ? CARDIOVERSION N/A 05/22/2015  ? Procedure: CARDIOVERSION;  Surgeon: Thayer Headings, MD;  Location: [facility];  Service: Cardiovascular;  Laterality: N/A;  ? CARDIOVERSION N/A 10/08/2017  ? Procedure: CARDIOVERSION;  Surgeon: Pixie Casino, MD;  Location: [facility];  Service: Cardiovascular;  Laterality: N/A;  ? CARDIOVERSION N/A 03/13/2020  ? Procedure: CARDIOVERSION;  Surgeon: [name], MD;  Location: [facility];  Service: Cardiovascular;  Laterality: N/A;  ? CARPAL TUNNEL RELEASE Left April 2015  ? ELBOW / UPPER ARM FOREIGN BODY REMOVAL    ? ELECTROPHYSIOLOGIC STUDY N/A 09/25/2014  ? Atrial fibrillation ablation performed by Dr Rayann Heman  ? FLEXIBLE SIGMOIDOSCOPY N/A 08/28/2015  ? Procedure: FLEXIBLE SIGMOIDOSCOPY;  Surgeon: [name], MD;  Location: [facility];  Service: Endoscopy;  Laterality: N/A;  ? FRACTURE SURGERY Left   ? Left wrist  ? HOT HEMOSTASIS N/A 08/28/2015  ? Procedure: HOT HEMOSTASIS (ARGON PLASMA COAGULATION/BICAP);  Surgeon: [name], MD;  Location: [facility];  Service: Endoscopy;  Laterality: N/A;   ? LEFT AND RIGHT HEART CATHETERIZATION WITH CORONARY ANGIOGRAM N/A 03/17/2013  ? Procedure: LEFT AND RIGHT HEART CATHETERIZATION WITH CORONARY ANGIOGRAM;  Surgeon: Troy Sine, MD;  Location: [facility];  Service: Cardiovascular;  Laterality: N/A;  ? osteosynthesis Left 09/30/2012  ? with small plate in the ulna  ? SUBXYPHOID PERICARDIAL WINDOW N/A 04/25/2013  ? Procedure: SUBXYPHOID PERICARDIAL WINDOW;  Surgeon: Gaye Pollack, MD;  Location: [facility];  Service: Thoracic;  Laterality: N/A;  ? TEE WITHOUT CARDIOVERSION N/A 09/24/2014  ? Procedure: TRANSESOPHAGEAL ECHOCARDIOGRAM (TEE);  Surgeon: Dorothy Spark, MD;  Location: [facility]OSCOPY;  Service: Cardiovascular;  Laterality: N/A;  ? TRANSESOPHAGEAL ECHOCARDIOGRAM  08/26/2010  ? see Notes tab  ? ? ?Current Outpatient Medications  ?Medication Sig Dispense Refill  ? atorvastatin (LIPITOR) 40 MG tablet Take 1 tablet (40 mg total) by mouth every evening. 90 tablet 3  ? b complex vitamins capsule Take 1  capsule by mouth daily.    ? CALCIUM-MAGNESIUM-ZINC PO Take 2 tablets by mouth at bedtime.    ? Cholecalciferol (VITAMIN D) 2000 UNITS CAPS Take 2,000 Units by mouth daily.    ? diltiazem (CARDIZEM CD) 180 MG 24 hr capsule TAKE 1 CAPSULE BY MOUTH EVERY DAY 90 capsule 2  ? dofetilide (TIKOSYN) 500 MCG capsule Take 1 capsule (500 mcg total) by mouth 2 (two) times daily. 180 capsule 3  ? finasteride (PROPECIA) 1 MG tablet Take 1 mg by mouth daily.    ? furosemide (LASIX) 40 MG tablet Take 0.5 tablets (20 mg total) by mouth daily. 45 tablet 3  ? metoprolol succinate (TOPROL-XL) 25 MG 24 hr tablet Take 0.5 tablets (12.5 mg total) by mouth in the morning and at bedtime. May use extra tablet daily for breakthrough (Patient taking differently: Take 25 mg by mouth in the morning and at bedtime. May use extra tablet daily for breakthrough) 120 tablet 2  ? potassium chloride SA (KLOR-CON M20) 20 MEQ tablet TAKE ONE (1) TABLET (20 MEQ) BY MOUTH DAILY. 90 tablet 2  ? Psyllium (DAILY  FIBER) 400 MG CAPS Take 3 capsules by mouth 2 (two) times daily.    ? rivaroxaban (XARELTO) 20 MG TABS tablet Take 1 tablet (20 mg total) by mouth daily with supper. 90 tablet 2  ? Saw Palmetto 450 MG CA

## 2021-07-23 NOTE — Addendum Note (Signed)
Encounter addended by: Juluis Mire, RN on: 07/23/2021 12:17 PM ? Actions taken: Pharmacy for encounter modified, Order list changed

## 2021-07-23 NOTE — Patient Instructions (Addendum)
Night before cardioversion reduce metoprolol back to normal dosing ? ?Cardioversion scheduled for Wednesday, May 3rd ? - Arrive at the [facility] and go to admitting at 11am ? - Do not eat or drink anything after midnight the night prior to your procedure. ? - Take all your morning medication (except diabetic medications) with a sip of water prior to arrival. ? - You will not be able to drive home after your procedure. ? - Do NOT miss any doses of your blood thinner - if you should miss a dose please notify our office immediately. ? - If you feel as if you go back into normal rhythm prior to scheduled cardioversion, please notify our office immediately. If your procedure is canceled in the cardioversion suite you will be charged a cancellation fee. ? ?

## 2021-07-24 ENCOUNTER — Other Ambulatory Visit: Payer: Self-pay | Admitting: Internal Medicine

## 2021-07-24 NOTE — Telephone Encounter (Signed)
This is a A-Fib clinic pt that was seen yesterday. Please address ?

## 2021-07-28 DIAGNOSIS — D649 Anemia, unspecified: Secondary | ICD-10-CM | POA: Diagnosis not present

## 2021-07-29 ENCOUNTER — Encounter (HOSPITAL_COMMUNITY)
Admission: RE | Disposition: A | Discharge status: Home / Self Care | Payer: Self-pay | Source: Home / Self Care | Attending: Internal Medicine

## 2021-07-29 ENCOUNTER — Ambulatory Visit (HOSPITAL_COMMUNITY)
Admission: RE | Admit: 2021-07-29 | Discharge: 2021-07-29 | Disposition: A | Discharge status: Home / Self Care | Payer: Medicare PPO | Attending: Internal Medicine | Admitting: Internal Medicine

## 2021-07-29 ENCOUNTER — Other Ambulatory Visit: Payer: Self-pay

## 2021-07-29 ENCOUNTER — Ambulatory Visit (HOSPITAL_COMMUNITY): Payer: Medicare PPO | Admitting: Certified Registered Nurse Anesthetist

## 2021-07-29 ENCOUNTER — Encounter (HOSPITAL_COMMUNITY): Payer: Self-pay | Admitting: Internal Medicine

## 2021-07-29 ENCOUNTER — Ambulatory Visit (HOSPITAL_BASED_OUTPATIENT_CLINIC_OR_DEPARTMENT_OTHER): Payer: Medicare PPO | Admitting: Certified Registered Nurse Anesthetist

## 2021-07-29 DIAGNOSIS — I10 Essential (primary) hypertension: Secondary | ICD-10-CM | POA: Diagnosis not present

## 2021-07-29 DIAGNOSIS — I351 Nonrheumatic aortic (valve) insufficiency: Secondary | ICD-10-CM | POA: Insufficient documentation

## 2021-07-29 DIAGNOSIS — F32A Depression, unspecified: Secondary | ICD-10-CM | POA: Insufficient documentation

## 2021-07-29 DIAGNOSIS — Z7901 Long term (current) use of anticoagulants: Secondary | ICD-10-CM | POA: Diagnosis not present

## 2021-07-29 DIAGNOSIS — I4891 Unspecified atrial fibrillation: Secondary | ICD-10-CM

## 2021-07-29 DIAGNOSIS — Z87891 Personal history of nicotine dependence: Secondary | ICD-10-CM | POA: Diagnosis not present

## 2021-07-29 DIAGNOSIS — I440 Atrioventricular block, first degree: Secondary | ICD-10-CM | POA: Diagnosis not present

## 2021-07-29 DIAGNOSIS — I4819 Other persistent atrial fibrillation: Secondary | ICD-10-CM | POA: Diagnosis not present

## 2021-07-29 DIAGNOSIS — I2510 Atherosclerotic heart disease of native coronary artery without angina pectoris: Secondary | ICD-10-CM | POA: Insufficient documentation

## 2021-07-29 DIAGNOSIS — Z79899 Other long term (current) drug therapy: Secondary | ICD-10-CM | POA: Diagnosis not present

## 2021-07-29 DIAGNOSIS — G4730 Sleep apnea, unspecified: Secondary | ICD-10-CM

## 2021-07-29 DIAGNOSIS — G4733 Obstructive sleep apnea (adult) (pediatric): Secondary | ICD-10-CM | POA: Diagnosis not present

## 2021-07-29 DIAGNOSIS — E785 Hyperlipidemia, unspecified: Secondary | ICD-10-CM | POA: Insufficient documentation

## 2021-07-29 HISTORY — PX: CARDIOVERSION: SHX1299

## 2021-07-29 SURGERY — CARDIOVERSION
Anesthesia: General

## 2021-07-29 MED ORDER — PROPOFOL 10 MG/ML IV BOLUS
INTRAVENOUS | Status: DC | PRN
  Administered 2021-07-29: 40 mg via INTRAVENOUS
  Administered 2021-07-29: 60 mg via INTRAVENOUS

## 2021-07-29 MED ORDER — SODIUM CHLORIDE 0.9 % IV SOLN
INTRAVENOUS | Status: AC | PRN
  Administered 2021-07-29: 500 mL via INTRAVENOUS

## 2021-07-29 NOTE — Anesthesia Preprocedure Evaluation (Addendum)
Anesthesia Evaluation  ?Patient identified by MRN, date of birth, ID band ?Patient awake ? ? ? ?Reviewed: ?Allergy & Precautions, NPO status , Patient's Chart, lab work & pertinent test results, reviewed documented beta blocker date and time  ? ?History of Anesthesia Complications ?Negative for: history of anesthetic complications ? ?Airway ?Mallampati: III ? ?TM Distance: >3 FB ?Neck ROM: Full ? ? ? Dental ? ?(+) Dental Advisory Given, Teeth Intact ?  ?Pulmonary ?sleep apnea , former smoker,  ?  ?breath sounds clear to auscultation ? ? ? ? ? ? Cardiovascular ?hypertension, Pt. on medications and Pt. on home beta blockers ?(-) angina+ dysrhythmias Atrial Fibrillation  ?Rhythm:Irregular  ??1. Left ventricular ejection fraction, by estimation, is 65 to 70%. Left  ?ventricular ejection fraction by 3D volume is 66 %. The left ventricle has  ?normal function. The left ventricle has no regional wall motion  ?abnormalities. Left ventricular diastolic  ??parameters are consistent with Grade II diastolic dysfunction  ?(pseudonormalization). The average left ventricular global longitudinal  ?strain is 23.1 %. The global longitudinal strain is normal.  ??2. Right ventricular systolic function is hyperdynamic. The right  ?ventricular size is moderately enlarged. There is normal pulmonary artery  ?systolic pressure. The estimated right ventricular systolic pressure is  ?75.1 mmHg.  ??3. Left atrial size was moderately dilated.  ??4. Right atrial size was moderately dilated.  ??5. The mitral valve is normal in structure. Trivial mitral valve  ?regurgitation. No evidence of mitral stenosis.  ??6. The aortic valve was not well visualized. Aortic valve regurgitation  ?is mild. No aortic stenosis is present. Aortic regurgitation PHT measures  ?509 msec.  ??7. Aortic dilatation noted. There is mild dilatation of the ascending  ?aorta, measuring 44 mm. There is moderate dilatation of the aortic root,   ?measuring 48 mm.  ??8. The inferior vena cava is normal in size with greater than 50%  ?respiratory variability, suggesting right atrial pressure of 3 mmHg.  ? ? ? ?? Nuclear stress EF: 58%. ?? There was no ST segment deviation noted during stress. ?? No T wave inversion was noted during stress. ?? Defect 1: There is a small defect of mild severity present in the basal inferolateral, mid inferolateral and apex location. ?? The left ventricular ejection fraction is normal (55-65%). ?? This is a low risk study. ? ? ?? Nuclear stress EF: 58%. ?? There was no ST segment deviation noted during stress. ?? No T wave inversion was noted during stress. ?? Defect 1: There is a small defect of mild severity present in the basal inferolateral, mid inferolateral and apex location. ?? The left ventricular ejection fraction is normal (55-65%). ?? This is a low risk study. ? ?  ?Neuro/Psych ?PSYCHIATRIC DISORDERS Depression   ? GI/Hepatic ?negative GI ROS, Neg liver ROS,   ?Endo/Other  ?negative endocrine ROS ? Renal/GU ?negative Renal ROS  ? ?  ?Musculoskeletal ?negative musculoskeletal ROS ?(+)  ? Abdominal ?  ?Peds ? Hematology ? ?(+) Blood dyscrasia, , xarelto   ?Anesthesia Other Findings ? ? Reproductive/Obstetrics ? ?  ? ? ? ? ? ? ? ? ? ? ? ? ? ?  ?  ? ? ? ? ? ? ?Anesthesia Physical ?Anesthesia Plan ? ?ASA: 3 ? ?Anesthesia Plan: General  ? ?Post-op Pain Management: Minimal or no pain anticipated  ? ?Induction: Intravenous ? ?PONV Risk Score and Plan: 2 and Treatment may vary due to age or medical condition ? ?Airway Management Planned: Mask, Simple Face Mask, Nasal  Cannula and Natural Airway ? ?Additional Equipment: None ? ?Intra-op Plan:  ? ?Post-operative Plan:  ? ?Informed Consent: I have reviewed the patients History and Physical, chart, labs and discussed the procedure including the risks, benefits and alternatives for the proposed anesthesia with the patient or authorized representative who has indicated his/her  understanding and acceptance.  ? ? ? ?Dental advisory given ? ?Plan Discussed with: CRNA ? ?Anesthesia Plan Comments:   ? ? ? ? ? ? ?Anesthesia Quick Evaluation ? ?

## 2021-07-29 NOTE — CV Procedure (Signed)
Procedure: Electrical Cardioversion ?Indications:  Atrial Fibrillation ? ?Procedure Details: ? ?Consent: Risks of procedure as well as the alternatives and risks of each were explained to the (patient/caregiver).  Consent for procedure obtained. ? ?Time Out: Verified patient identification, verified procedure, site/side was marked, verified correct patient position, special equipment/implants available, medications/allergies/relevent history reviewed, required imaging and test results available. PERFORMED. ? ?Patient placed on cardiac monitor, pulse oximetry, supplemental oxygen as necessary.  ?Sedation given:  Propofol ?Pacer pads placed anterior and posterior chest. ? ?Cardioverted 1 time(s).  ?Cardioversion with synchronized biphasic 200J shock. ? ?Evaluation: ?Findings: Post procedure EKG shows: NSR ?Complications: None ?Patient did tolerate procedure well. ? ?Time Spent Directly with the Patient: ? ?20 minutes  ? ?Phineas Inches E ?07/29/2021, 11:16 AM ? ?

## 2021-07-29 NOTE — Transfer of Care (Signed)
Immediate Anesthesia Transfer of Care Note ? ?Patient: [name] [name] ? ?Procedure(s) Performed: CARDIOVERSION ? ?Patient Location: Endoscopy Unit ? ?Anesthesia Type:General ? ?Level of Consciousness: drowsy ? ?Airway & Oxygen Therapy: Patient Spontanous Breathing ? ?Post-op Assessment: Report given to RN and Post -op Vital signs reviewed and stable ? ?Post vital signs: Reviewed and stable ? ?Last Vitals:  ?Vitals Value Taken Time  ?BP 115/74 1116 07/29/21  ?Temp    ?Pulse 73 1116 07/29/21  ?Resp 14 1116 07/29/21  ?SpO2 96 1116 07/29/21  ? ? ?Last Pain:  ?Vitals:  ? 07/29/21 1021  ?TempSrc: Oral  ?PainSc: 0-No pain  ?   ? ?  ? ?Complications: No notable events documented. ?

## 2021-07-29 NOTE — Anesthesia Procedure Notes (Signed)
Procedure Name: [place] ?Date/Time: 07/29/2021 11:10 AM ?Performed by: Colin Benton, CRNA ?Pre-anesthesia Checklist: Patient identified, Emergency Drugs available, Suction available and Patient being monitored ?Patient Re-evaluated:Patient Re-evaluated prior to induction ?Oxygen Delivery Method: Ambu bag ?Preoxygenation: Pre-oxygenation with 100% oxygen ?Induction Type: IV induction ?Placement Confirmation: positive ETCO2 ?Dental Injury: Teeth and Oropharynx as per pre-operative assessment  ? ? ? ? ?

## 2021-07-29 NOTE — Anesthesia Postprocedure Evaluation (Signed)
Anesthesia Post Note ? ?Patient: ABBAS BEYENE ? ?Procedure(s) Performed: CARDIOVERSION ? ?  ? ?Patient location during evaluation: Endoscopy ?Anesthesia Type: General ?Level of consciousness: awake and alert ?Pain management: pain level controlled ?Vital Signs Assessment: post-procedure vital signs reviewed and stable ?Respiratory status: spontaneous breathing, nonlabored ventilation and respiratory function stable ?Cardiovascular status: blood pressure returned to baseline and stable ?Postop Assessment: no apparent nausea or vomiting ?Anesthetic complications: no ? ? ?No notable events documented. ? ?Last Vitals:  ?Vitals:  ? 07/29/21 1130 07/29/21 1140  ?BP: 113/73 118/83  ?Pulse:  71  ?Resp:  (!) 21  ?Temp:    ?SpO2:  100%  ?  ?Last Pain:  ?Vitals:  ? 07/29/21 1140  ?TempSrc:   ?PainSc: 0-No pain  ? ? ?  ?  ?  ?  ?  ?  ? ?Enslie Sahota ? ? ? ? ?

## 2021-07-29 NOTE — Interval H&P Note (Signed)
History and Physical Interval Note: ? ?07/29/2021 ?10:21 AM ? ?[name]  has presented today for surgery, with the diagnosis of AFIB.  The various methods of treatment have been discussed with the patient and family. After consideration of risks, benefits and other options for treatment, the patient has consented to  Procedure(s): ?CARDIOVERSION (N/A) as a surgical intervention.  The patient's history has been reviewed, patient examined, no change in status, stable for surgery.  I have reviewed the patient's chart and labs.  Questions were answered to the patient's satisfaction.   ? ? ?[name] [name] E ? ? ?

## 2021-07-30 ENCOUNTER — Encounter (HOSPITAL_COMMUNITY): Payer: Self-pay | Admitting: Internal Medicine

## 2021-08-04 DIAGNOSIS — G4733 Obstructive sleep apnea (adult) (pediatric): Secondary | ICD-10-CM | POA: Diagnosis not present

## 2021-08-05 ENCOUNTER — Ambulatory Visit (HOSPITAL_COMMUNITY)
Admission: RE | Admit: 2021-08-05 | Discharge: 2021-08-05 | Disposition: A | Discharge status: Home / Self Care | Payer: Medicare PPO | Source: Ambulatory Visit | Attending: Nurse Practitioner | Admitting: Nurse Practitioner

## 2021-08-05 ENCOUNTER — Encounter (HOSPITAL_COMMUNITY): Payer: Self-pay | Admitting: Nurse Practitioner

## 2021-08-05 VITALS — BP 118/68 | HR 59 | Ht 73.0 in | Wt 177.6 lb

## 2021-08-05 DIAGNOSIS — G4733 Obstructive sleep apnea (adult) (pediatric): Secondary | ICD-10-CM | POA: Insufficient documentation

## 2021-08-05 DIAGNOSIS — Z87891 Personal history of nicotine dependence: Secondary | ICD-10-CM | POA: Insufficient documentation

## 2021-08-05 DIAGNOSIS — I10 Essential (primary) hypertension: Secondary | ICD-10-CM | POA: Diagnosis not present

## 2021-08-05 DIAGNOSIS — D6869 Other thrombophilia: Secondary | ICD-10-CM

## 2021-08-05 DIAGNOSIS — I77819 Aortic ectasia, unspecified site: Secondary | ICD-10-CM | POA: Diagnosis not present

## 2021-08-05 DIAGNOSIS — I2510 Atherosclerotic heart disease of native coronary artery without angina pectoris: Secondary | ICD-10-CM | POA: Insufficient documentation

## 2021-08-05 DIAGNOSIS — I4819 Other persistent atrial fibrillation: Secondary | ICD-10-CM | POA: Diagnosis not present

## 2021-08-05 MED ORDER — METOPROLOL SUCCINATE ER 25 MG PO TB24: ORAL_TABLET | ORAL | 3 refills | Status: AC

## 2021-08-05 MED ORDER — METOPROLOL SUCCINATE ER 25 MG PO TB24: ORAL_TABLET | ORAL | Status: DC

## 2021-08-05 NOTE — Progress Notes (Signed)
? ?Primary Care Physician: [name], MD ?Referring Physician: Dr. Rayann Heman ? ? ?[name] [name] is a 76 y.o. male with a h/o very complex afib history requiring 80 + cardioversions, ablations and tikosyn, over many years of having afib. Patient has done well from an afib standpoint until this past weekend 11/17/19 when he noticed much more fatigue during exercise. The next morning he checked his HR which was elevated. He is in rapid afib today. There were no specific triggers that he could identify.  ? ?F/u in afib clinic, 8/25. He is here to f/u cardioversion, but converted himself the am of cardioversion. 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 EKG shows SR with first degree AV block at 61 bpm.  ? ?F/u afib clinic, 01/18/20. He is now back from [place], he had a successful play and felt great. He remains in SR. He did not have any afib in the [place] area. His echo showed mod aortic root dilation and he is pending a CT of the chest on Monday to further evaluate.  ? ?F/u in afib clinc, 03/07/20.  for return of afib several days ago. I increased his BB as he had RVR. His BP is soft today at 06/74. Ekg shows afib at 124 bpm. No known trigger. No missed anticoagulation  and has had vaccines.  ? ?Returns  to clinic 03/20/20 s/p successful cardioversion. He remains in SR and feels great.  ? ?F/u in afib clinic, 10/01/20. No afib to report he feels well.  ? ?F/u in afib clinic, 03/12/21 as he returned to afib since 12/2. No specific trigger. He has moved to [place], [place], but still has his house here. He has returned to town to wait for cardioversion. No missed doses. He has atrial flutter at 123 bpm on EKG.  ? ?F/u in afib clinic, 07/23/21, he went into afib one week ago while at the beach. He requires cardioversion when he goes out of rhythm. He has RVR despite increasing BB. No missed anticoagulation.  ? ?F/u in afib clinic,08/05/21.  S/p successful cardioversion. Ekg today  shows SR. He feels improved. He will continue on Tikosyn. He is going to establish with EP in [place] in later  May as he has moved to [employer] area. He had a f/u cbc with PCP last week and results were stable. Last cbc here  had shown slight decline since previous draw. He has started back on iron.  ? ?Today, he denies symptoms of chest pain, shortness of breath, orthopnea, PND, lower extremity edema, dizziness, presyncope, syncope, or neurologic sequela.  The patient is tolerating medications without difficulties and is otherwise without complaint today.  ? ?Past Medical History:  ?Diagnosis Date  ? Cardiomyopathy ([place])   ? a. 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 b. also hx of tachycardia mediated CM, now resolved  ? Complication of anesthesia   ? Pt has anxiety issues and thinks he is going to die.  ? Depression   ? Echocardiogram abnormal   ? EF60%, mild LVH, PA pk pressure 35, pericardial effusion mild increase from 2012   ? H/O cardiac catheterization   ? no significant CAD by cath 5/12  ? Hyperlipidemia   ? Hypertension   ? Limb pain 06/20/2007  ? LE venous doppler - no evidence of thrombus or thrombophlebitis  ? Liver mass   ? a. CT 03/2013: Small probable cysts in  the liver and small hyperenhancing liver mass.  ? OSA (obstructive sleep apnea)   ? AHI avg 5/hr  ? Paroxysmal atrial fibrillation (HCC)   ? a. s/p afib ablations 2002 and 2009 by Dr Rolland Porter. b. s/p DCCV 02/2013. c. s/p DCCV 01/19/2014  ? Pericardial effusion   ? a. Since 2007. b. s/p pericardial window 04/2013.  ? PVC (premature ventricular contraction) 12/18/2009  ? cardionet monitor 21 days - some PVCs, ventricular bigeminy  ? Thoracic aortic aneurysm ([place])   ? a. Borderline enlarged by CT 12/14 at 4.0cm.  ? ?Past Surgical History:  ?Procedure Laterality Date  ? CARDIAC CATHETERIZATION  08/25/10  ? NO SIGNIFICANT CAD  ? CARDIAC ELECTROPHYSIOLOGY MAPPING AND ABLATION  9811,9147  ? Dr. Tally Due  ? CARDIOVASCULAR STRESS TEST   09/08/2005  ? R/S MV - EF 61%; Exercises capacity 12 Mets; essentially normal perfusion myocardial scan    ? CARDIOVERSION  10/18/2007  ? successful  ? CARDIOVERSION N/A 02/08/2013  ? Procedure: CARDIOVERSION;  Surgeon: Troy Sine, MD;  Location: [facility];  Service: Cardiovascular;  Laterality: N/A;  ? CARDIOVERSION N/A 08/17/2013  ? Procedure: CARDIOVERSION;  Surgeon: Sanda Klein, MD;  Location: [facility];  Service: Cardiovascular;  Laterality: N/A;  ? CARDIOVERSION N/A 12/28/2013  ? Procedure: CARDIOVERSION;  Surgeon: Fay Records, MD;  Location: [facility];  Service: Cardiovascular;  Laterality: N/A;  ? CARDIOVERSION N/A 01/19/2014  ? Procedure: CARDIOVERSION;  Surgeon: Sanda Klein, MD;  Location: [facility];  Service: Cardiovascular;  Laterality: N/A;  ? CARDIOVERSION N/A 03/11/2014  ? Procedure: CARDIOVERSION;  Surgeon: Evans Lance, MD;  Location: [facility];  Service: Cardiovascular;  Laterality: N/A;  ? CARDIOVERSION N/A 08/23/2014  ? Procedure: CARDIOVERSION;  Surgeon: Lelon Perla, MD;  Location: [facility];  Service: Cardiovascular;  Laterality: N/A;  ? CARDIOVERSION N/A 10/04/2014  ? Procedure: CARDIOVERSION;  Surgeon: Sueanne Margarita, MD;  Location: [facility];  Service: Cardiovascular;  Laterality: N/A;  ? CARDIOVERSION N/A 05/22/2015  ? Procedure: CARDIOVERSION;  Surgeon: Thayer Headings, MD;  Location: [facility];  Service: Cardiovascular;  Laterality: N/A;  ? CARDIOVERSION N/A 10/08/2017  ? Procedure: CARDIOVERSION;  Surgeon: Pixie Casino, MD;  Location: [facility];  Service: Cardiovascular;  Laterality: N/A;  ? CARDIOVERSION N/A 03/13/2020  ? Procedure: CARDIOVERSION;  Surgeon: [name], MD;  Location: [facility]COPY;  Service: Cardiovascular;  Laterality: N/A;  ? CARDIOVERSION N/A 07/29/2021  ? Procedure: CARDIOVERSION;  Surgeon: Janina Mayo, MD;  Location: [facility];  Service: Cardiovascular;  Laterality: N/A;  ? CARPAL TUNNEL RELEASE Left April 2015  ? ELBOW /  UPPER ARM FOREIGN BODY REMOVAL    ? ELECTROPHYSIOLOGIC STUDY N/A 09/25/2014  ? Atrial fibrillation ablation performed by Dr Rayann Heman  ? FLEXIBLE SIGMOIDOSCOPY N/A 08/28/2015  ? Procedure: FLEXIBLE SIGMOIDOSCOPY;  Surgeon: [name], MD;  Location: [facility];  Service: Endoscopy;  Laterality: N/A;  ? FRACTURE SURGERY Left   ? Left wrist  ? HOT HEMOSTASIS N/A 08/28/2015  ? Procedure: HOT HEMOSTASIS (ARGON PLASMA COAGULATION/BICAP);  Surgeon: [name], MD;  Location: [facility];  Service: Endoscopy;  Laterality: N/A;  ? LEFT AND RIGHT HEART CATHETERIZATION WITH CORONARY ANGIOGRAM N/A 03/17/2013  ? Procedure: LEFT AND RIGHT HEART CATHETERIZATION WITH CORONARY ANGIOGRAM;  Surgeon: Troy Sine, MD;  Location: [facility] C[facility] LAB;  Service: Cardiovascular;  Laterality: N/A;  ? osteosynthesis Left 09/30/2012  ? with small plate in the ulna  ? SUBXYPHOID PERICARDIAL WINDOW N/A 04/25/2013  ? Procedure:  SUBXYPHOID PERICARDIAL WINDOW;  Surgeon: Gaye Pollack, MD;  Location: [facility];  Service: Thoracic;  Laterality: N/A;  ? TEE WITHOUT CARDIOVERSION N/A 09/24/2014  ? Procedure: TRANSESOPHAGEAL ECHOCARDIOGRAM (TEE);  Surgeon: Dorothy Spark, MD;  Location: [facility] ENDOSCOPY;  Service: Cardiovascular;  Laterality: N/A;  ? TRANSESOPHAGEAL ECHOCARDIOGRAM  08/26/2010  ? see Notes tab  ? ? ?Current Outpatient Medications  ?Medication Sig Dispense Refill  ? atorvastatin (LIPITOR) 40 MG tablet TAKE 1 TABLET BY MOUTH EVERY DAY IN THE EVENING 90 tablet 1  ? b complex vitamins capsule Take 1 capsule by mouth daily.    ? CALCIUM-MAGNESIUM-ZINC PO Take 2 tablets by mouth at bedtime.    ? Cholecalciferol (VITAMIN D) 2000 UNITS CAPS Take 2,000 Units by mouth daily.    ? diltiazem (CARDIZEM CD) 180 MG 24 hr capsule TAKE 1 CAPSULE BY MOUTH EVERY DAY (Patient taking differently: Take 180 mg by mouth at bedtime. TAKE 1 CAPSULE BY MOUTH EVERY DAY) 90 capsule 2  ? dofetilide (TIKOSYN) 500 MCG capsule Take 1 capsule (500 mcg total) by mouth 2  (two) times daily. 180 capsule 3  ? Ferrous Sulfate Dried (SLOW RELEASE IRON) 45 MG TBCR Take 45 mg by mouth daily.    ? finasteride (PROPECIA) 1 MG tablet Take 1 mg by mouth daily.    ? furosemide (LASIX) 4

## 2021-08-13 ENCOUNTER — Ambulatory Visit (HOSPITAL_COMMUNITY): Payer: Medicare PPO | Admitting: Nurse Practitioner

## 2021-09-03 DIAGNOSIS — I4819 Other persistent atrial fibrillation: Secondary | ICD-10-CM | POA: Diagnosis not present

## 2021-10-09 DIAGNOSIS — I712 Thoracic aortic aneurysm, without rupture, unspecified: Secondary | ICD-10-CM | POA: Diagnosis not present

## 2021-10-09 DIAGNOSIS — I4819 Other persistent atrial fibrillation: Secondary | ICD-10-CM | POA: Diagnosis not present

## 2021-10-14 DIAGNOSIS — I77810 Thoracic aortic ectasia: Secondary | ICD-10-CM | POA: Diagnosis not present

## 2021-10-14 DIAGNOSIS — Z79899 Other long term (current) drug therapy: Secondary | ICD-10-CM | POA: Diagnosis not present

## 2021-10-14 DIAGNOSIS — I440 Atrioventricular block, first degree: Secondary | ICD-10-CM | POA: Diagnosis not present

## 2021-10-14 DIAGNOSIS — I4819 Other persistent atrial fibrillation: Secondary | ICD-10-CM | POA: Diagnosis not present

## 2021-10-14 DIAGNOSIS — I10 Essential (primary) hypertension: Secondary | ICD-10-CM | POA: Diagnosis not present

## 2021-10-14 DIAGNOSIS — I4891 Unspecified atrial fibrillation: Secondary | ICD-10-CM | POA: Diagnosis not present

## 2021-10-14 DIAGNOSIS — R9431 Abnormal electrocardiogram [ECG] [EKG]: Secondary | ICD-10-CM | POA: Diagnosis not present

## 2021-10-14 DIAGNOSIS — G4733 Obstructive sleep apnea (adult) (pediatric): Secondary | ICD-10-CM | POA: Diagnosis not present

## 2021-10-14 DIAGNOSIS — Z87891 Personal history of nicotine dependence: Secondary | ICD-10-CM | POA: Diagnosis not present

## 2021-10-14 DIAGNOSIS — H35033 Hypertensive retinopathy, bilateral: Secondary | ICD-10-CM | POA: Diagnosis not present

## 2021-10-22 DIAGNOSIS — I7121 Aneurysm of the ascending aorta, without rupture: Secondary | ICD-10-CM | POA: Diagnosis not present

## 2021-10-22 DIAGNOSIS — I10 Essential (primary) hypertension: Secondary | ICD-10-CM | POA: Diagnosis not present

## 2021-10-22 DIAGNOSIS — I480 Paroxysmal atrial fibrillation: Secondary | ICD-10-CM | POA: Diagnosis not present

## 2021-11-05 DIAGNOSIS — R600 Localized edema: Secondary | ICD-10-CM | POA: Diagnosis not present

## 2021-11-05 DIAGNOSIS — I10 Essential (primary) hypertension: Secondary | ICD-10-CM | POA: Diagnosis not present

## 2021-11-05 DIAGNOSIS — I4819 Other persistent atrial fibrillation: Secondary | ICD-10-CM | POA: Diagnosis not present

## 2021-11-05 DIAGNOSIS — N529 Male erectile dysfunction, unspecified: Secondary | ICD-10-CM | POA: Diagnosis not present

## 2021-11-05 DIAGNOSIS — I77810 Thoracic aortic ectasia: Secondary | ICD-10-CM | POA: Diagnosis not present

## 2021-11-20 DIAGNOSIS — L859 Epidermal thickening, unspecified: Secondary | ICD-10-CM | POA: Diagnosis not present

## 2021-11-20 DIAGNOSIS — D485 Neoplasm of uncertain behavior of skin: Secondary | ICD-10-CM | POA: Diagnosis not present

## 2021-12-02 DIAGNOSIS — Z5181 Encounter for therapeutic drug level monitoring: Secondary | ICD-10-CM | POA: Diagnosis not present

## 2021-12-02 DIAGNOSIS — Z7901 Long term (current) use of anticoagulants: Secondary | ICD-10-CM | POA: Diagnosis not present

## 2021-12-02 DIAGNOSIS — I4819 Other persistent atrial fibrillation: Secondary | ICD-10-CM | POA: Diagnosis not present

## 2021-12-02 DIAGNOSIS — Z79899 Other long term (current) drug therapy: Secondary | ICD-10-CM | POA: Diagnosis not present

## 2021-12-10 DIAGNOSIS — R9431 Abnormal electrocardiogram [ECG] [EKG]: Secondary | ICD-10-CM | POA: Diagnosis not present

## 2021-12-10 DIAGNOSIS — Z87891 Personal history of nicotine dependence: Secondary | ICD-10-CM | POA: Diagnosis not present

## 2021-12-10 DIAGNOSIS — I712 Thoracic aortic aneurysm, without rupture, unspecified: Secondary | ICD-10-CM | POA: Diagnosis not present

## 2021-12-10 DIAGNOSIS — G4733 Obstructive sleep apnea (adult) (pediatric): Secondary | ICD-10-CM | POA: Diagnosis not present

## 2021-12-10 DIAGNOSIS — I440 Atrioventricular block, first degree: Secondary | ICD-10-CM | POA: Diagnosis not present

## 2021-12-10 DIAGNOSIS — I10 Essential (primary) hypertension: Secondary | ICD-10-CM | POA: Diagnosis not present

## 2021-12-10 DIAGNOSIS — G4730 Sleep apnea, unspecified: Secondary | ICD-10-CM | POA: Diagnosis not present

## 2021-12-10 DIAGNOSIS — I4819 Other persistent atrial fibrillation: Secondary | ICD-10-CM | POA: Diagnosis not present

## 2021-12-10 DIAGNOSIS — K861 Other chronic pancreatitis: Secondary | ICD-10-CM | POA: Diagnosis not present

## 2021-12-16 ENCOUNTER — Other Ambulatory Visit: Payer: Self-pay | Admitting: Surgery

## 2021-12-16 DIAGNOSIS — I7121 Aneurysm of the ascending aorta, without rupture: Secondary | ICD-10-CM

## 2021-12-31 DIAGNOSIS — Z125 Encounter for screening for malignant neoplasm of prostate: Secondary | ICD-10-CM | POA: Diagnosis not present

## 2021-12-31 DIAGNOSIS — Z1211 Encounter for screening for malignant neoplasm of colon: Secondary | ICD-10-CM | POA: Diagnosis not present

## 2021-12-31 DIAGNOSIS — I4891 Unspecified atrial fibrillation: Secondary | ICD-10-CM | POA: Diagnosis not present

## 2021-12-31 DIAGNOSIS — Z1331 Encounter for screening for depression: Secondary | ICD-10-CM | POA: Diagnosis not present

## 2021-12-31 DIAGNOSIS — Z13220 Encounter for screening for lipoid disorders: Secondary | ICD-10-CM | POA: Diagnosis not present

## 2021-12-31 DIAGNOSIS — Z0000 Encounter for general adult medical examination without abnormal findings: Secondary | ICD-10-CM | POA: Diagnosis not present

## 2021-12-31 DIAGNOSIS — Z23 Encounter for immunization: Secondary | ICD-10-CM | POA: Diagnosis not present

## 2021-12-31 DIAGNOSIS — Z1159 Encounter for screening for other viral diseases: Secondary | ICD-10-CM | POA: Diagnosis not present

## 2021-12-31 DIAGNOSIS — Z136 Encounter for screening for cardiovascular disorders: Secondary | ICD-10-CM | POA: Diagnosis not present

## 2022-01-28 ENCOUNTER — Ambulatory Visit: Payer: Medicare PPO

## 2022-02-10 DIAGNOSIS — Z79899 Other long term (current) drug therapy: Secondary | ICD-10-CM | POA: Diagnosis not present

## 2022-02-17 DIAGNOSIS — I4819 Other persistent atrial fibrillation: Secondary | ICD-10-CM | POA: Diagnosis not present

## 2022-02-20 DIAGNOSIS — I4819 Other persistent atrial fibrillation: Secondary | ICD-10-CM | POA: Diagnosis not present

## 2022-03-18 DIAGNOSIS — Z961 Presence of intraocular lens: Secondary | ICD-10-CM | POA: Diagnosis not present

## 2022-03-18 DIAGNOSIS — H18603 Keratoconus, unspecified, bilateral: Secondary | ICD-10-CM | POA: Diagnosis not present

## 2022-03-23 DIAGNOSIS — I4819 Other persistent atrial fibrillation: Secondary | ICD-10-CM | POA: Diagnosis not present

## 2022-03-23 DIAGNOSIS — I7121 Aneurysm of the ascending aorta, without rupture: Secondary | ICD-10-CM | POA: Diagnosis not present

## 2022-03-23 DIAGNOSIS — I77810 Thoracic aortic ectasia: Secondary | ICD-10-CM | POA: Diagnosis not present

## 2022-03-29 ENCOUNTER — Other Ambulatory Visit (HOSPITAL_COMMUNITY): Payer: Self-pay | Admitting: Nurse Practitioner

## 2022-04-12 DIAGNOSIS — G4733 Obstructive sleep apnea (adult) (pediatric): Secondary | ICD-10-CM | POA: Diagnosis not present

## 2022-04-12 DIAGNOSIS — G4731 Primary central sleep apnea: Secondary | ICD-10-CM | POA: Diagnosis not present

## 2022-04-21 DIAGNOSIS — N2889 Other specified disorders of kidney and ureter: Secondary | ICD-10-CM | POA: Diagnosis not present

## 2022-04-27 DIAGNOSIS — I77810 Thoracic aortic ectasia: Secondary | ICD-10-CM | POA: Diagnosis not present

## 2022-04-27 DIAGNOSIS — I4819 Other persistent atrial fibrillation: Secondary | ICD-10-CM | POA: Diagnosis not present

## 2022-04-28 DIAGNOSIS — Z538 Procedure and treatment not carried out for other reasons: Secondary | ICD-10-CM | POA: Diagnosis not present

## 2022-04-28 DIAGNOSIS — I4891 Unspecified atrial fibrillation: Secondary | ICD-10-CM | POA: Diagnosis not present

## 2022-05-13 DIAGNOSIS — G4733 Obstructive sleep apnea (adult) (pediatric): Secondary | ICD-10-CM | POA: Diagnosis not present

## 2022-05-13 DIAGNOSIS — G4731 Primary central sleep apnea: Secondary | ICD-10-CM | POA: Diagnosis not present

## 2022-05-14 ENCOUNTER — Encounter (HOSPITAL_COMMUNITY): Payer: Self-pay | Admitting: *Deleted

## 2022-05-25 DIAGNOSIS — I4819 Other persistent atrial fibrillation: Secondary | ICD-10-CM | POA: Diagnosis not present

## 2022-05-30 ENCOUNTER — Other Ambulatory Visit (HOSPITAL_COMMUNITY): Payer: Self-pay | Admitting: Nurse Practitioner

## 2022-06-01 DIAGNOSIS — D229 Melanocytic nevi, unspecified: Secondary | ICD-10-CM | POA: Diagnosis not present

## 2022-06-01 DIAGNOSIS — D485 Neoplasm of uncertain behavior of skin: Secondary | ICD-10-CM | POA: Diagnosis not present

## 2022-06-01 DIAGNOSIS — L988 Other specified disorders of the skin and subcutaneous tissue: Secondary | ICD-10-CM | POA: Diagnosis not present

## 2022-06-01 DIAGNOSIS — L814 Other melanin hyperpigmentation: Secondary | ICD-10-CM | POA: Diagnosis not present

## 2022-06-01 DIAGNOSIS — L821 Other seborrheic keratosis: Secondary | ICD-10-CM | POA: Diagnosis not present

## 2022-06-01 DIAGNOSIS — I4819 Other persistent atrial fibrillation: Secondary | ICD-10-CM | POA: Diagnosis not present

## 2022-06-10 DIAGNOSIS — G4731 Primary central sleep apnea: Secondary | ICD-10-CM | POA: Diagnosis not present

## 2022-06-10 DIAGNOSIS — G4733 Obstructive sleep apnea (adult) (pediatric): Secondary | ICD-10-CM | POA: Diagnosis not present

## 2022-06-15 DIAGNOSIS — Z136 Encounter for screening for cardiovascular disorders: Secondary | ICD-10-CM | POA: Diagnosis not present

## 2022-06-15 DIAGNOSIS — Z87891 Personal history of nicotine dependence: Secondary | ICD-10-CM | POA: Diagnosis not present

## 2022-06-16 DIAGNOSIS — I4819 Other persistent atrial fibrillation: Secondary | ICD-10-CM | POA: Diagnosis not present

## 2022-06-19 DIAGNOSIS — H43811 Vitreous degeneration, right eye: Secondary | ICD-10-CM | POA: Diagnosis not present

## 2022-06-19 DIAGNOSIS — H35033 Hypertensive retinopathy, bilateral: Secondary | ICD-10-CM | POA: Diagnosis not present

## 2022-06-19 DIAGNOSIS — H18603 Keratoconus, unspecified, bilateral: Secondary | ICD-10-CM | POA: Diagnosis not present

## 2022-06-19 DIAGNOSIS — Z961 Presence of intraocular lens: Secondary | ICD-10-CM | POA: Diagnosis not present

## 2022-06-23 DIAGNOSIS — N5201 Erectile dysfunction due to arterial insufficiency: Secondary | ICD-10-CM | POA: Diagnosis not present

## 2022-07-01 DIAGNOSIS — I4819 Other persistent atrial fibrillation: Secondary | ICD-10-CM | POA: Diagnosis not present

## 2022-07-01 DIAGNOSIS — D649 Anemia, unspecified: Secondary | ICD-10-CM | POA: Diagnosis not present

## 2022-07-01 DIAGNOSIS — D6869 Other thrombophilia: Secondary | ICD-10-CM | POA: Diagnosis not present

## 2022-07-07 DIAGNOSIS — I4891 Unspecified atrial fibrillation: Secondary | ICD-10-CM | POA: Diagnosis not present

## 2022-07-07 DIAGNOSIS — I4819 Other persistent atrial fibrillation: Secondary | ICD-10-CM | POA: Diagnosis not present

## 2022-07-09 DIAGNOSIS — I4819 Other persistent atrial fibrillation: Secondary | ICD-10-CM | POA: Diagnosis not present

## 2022-07-17 DIAGNOSIS — Z5181 Encounter for therapeutic drug level monitoring: Secondary | ICD-10-CM | POA: Diagnosis not present

## 2022-07-17 DIAGNOSIS — Z7901 Long term (current) use of anticoagulants: Secondary | ICD-10-CM | POA: Diagnosis not present

## 2022-07-17 DIAGNOSIS — G4733 Obstructive sleep apnea (adult) (pediatric): Secondary | ICD-10-CM | POA: Diagnosis not present

## 2022-07-17 DIAGNOSIS — Z79899 Other long term (current) drug therapy: Secondary | ICD-10-CM | POA: Diagnosis not present

## 2022-07-17 DIAGNOSIS — I484 Atypical atrial flutter: Secondary | ICD-10-CM | POA: Diagnosis not present

## 2022-07-17 DIAGNOSIS — Z8679 Personal history of other diseases of the circulatory system: Secondary | ICD-10-CM | POA: Diagnosis not present

## 2022-07-17 DIAGNOSIS — I10 Essential (primary) hypertension: Secondary | ICD-10-CM | POA: Diagnosis not present

## 2022-07-17 DIAGNOSIS — Z9889 Other specified postprocedural states: Secondary | ICD-10-CM | POA: Diagnosis not present

## 2022-07-17 DIAGNOSIS — I4819 Other persistent atrial fibrillation: Secondary | ICD-10-CM | POA: Diagnosis not present

## 2022-07-23 DIAGNOSIS — D485 Neoplasm of uncertain behavior of skin: Secondary | ICD-10-CM | POA: Diagnosis not present

## 2022-08-03 DIAGNOSIS — Z5181 Encounter for therapeutic drug level monitoring: Secondary | ICD-10-CM | POA: Diagnosis not present

## 2022-08-03 DIAGNOSIS — I4819 Other persistent atrial fibrillation: Secondary | ICD-10-CM | POA: Diagnosis not present

## 2022-08-03 DIAGNOSIS — Z79899 Other long term (current) drug therapy: Secondary | ICD-10-CM | POA: Diagnosis not present

## 2022-08-03 DIAGNOSIS — E785 Hyperlipidemia, unspecified: Secondary | ICD-10-CM | POA: Diagnosis not present

## 2022-08-04 DIAGNOSIS — E785 Hyperlipidemia, unspecified: Secondary | ICD-10-CM | POA: Diagnosis not present

## 2022-08-04 DIAGNOSIS — I4819 Other persistent atrial fibrillation: Secondary | ICD-10-CM | POA: Diagnosis not present

## 2022-08-13 DIAGNOSIS — R9431 Abnormal electrocardiogram [ECG] [EKG]: Secondary | ICD-10-CM | POA: Diagnosis not present

## 2022-08-13 DIAGNOSIS — I484 Atypical atrial flutter: Secondary | ICD-10-CM | POA: Diagnosis not present

## 2022-08-13 DIAGNOSIS — I493 Ventricular premature depolarization: Secondary | ICD-10-CM | POA: Diagnosis not present

## 2022-08-13 DIAGNOSIS — I4819 Other persistent atrial fibrillation: Secondary | ICD-10-CM | POA: Diagnosis not present

## 2022-08-13 DIAGNOSIS — Z87891 Personal history of nicotine dependence: Secondary | ICD-10-CM | POA: Diagnosis not present

## 2022-08-27 DIAGNOSIS — I4811 Longstanding persistent atrial fibrillation: Secondary | ICD-10-CM | POA: Diagnosis not present

## 2022-09-21 DIAGNOSIS — H18613 Keratoconus, stable, bilateral: Secondary | ICD-10-CM | POA: Diagnosis not present

## 2022-09-21 DIAGNOSIS — H35033 Hypertensive retinopathy, bilateral: Secondary | ICD-10-CM | POA: Diagnosis not present
